# Patient Record
Sex: Female | Born: 1950
Health system: Southern US, Community
[De-identification: ages and names within clinical notes are randomized; demographics above are authoritative.]

## PROBLEM LIST (undated history)

## (undated) DIAGNOSIS — F329 Major depressive disorder, single episode, unspecified: Secondary | ICD-10-CM

## (undated) DIAGNOSIS — M8580 Other specified disorders of bone density and structure, unspecified site: Secondary | ICD-10-CM

## (undated) DIAGNOSIS — M542 Cervicalgia: Secondary | ICD-10-CM

## (undated) DIAGNOSIS — K219 Gastro-esophageal reflux disease without esophagitis: Secondary | ICD-10-CM

## (undated) DIAGNOSIS — G43009 Migraine without aura, not intractable, without status migrainosus: Secondary | ICD-10-CM

## (undated) DIAGNOSIS — I10 Essential (primary) hypertension: Secondary | ICD-10-CM

## (undated) HISTORY — PX: OVARIAN CYST REMOVAL: SHX89

## (undated) HISTORY — DX: Migraine without aura, not intractable, without status migrainosus: G43.009

## (undated) HISTORY — DX: Essential (primary) hypertension: I10

## (undated) HISTORY — DX: Gastro-esophageal reflux disease without esophagitis: K21.9

## (undated) HISTORY — PX: CHOLECYSTECTOMY: SHX55

## (undated) HISTORY — PX: REPAIR EXTENSOR TENDON HAND: SUR1171

## (undated) HISTORY — DX: Other specified disorders of bone density and structure, unspecified site: M85.80

## (undated) HISTORY — PX: BILATERAL OOPHORECTOMY: SHX1221

## (undated) HISTORY — DX: Major depressive disorder, single episode, unspecified: F32.9

## (undated) HISTORY — DX: Cervicalgia: M54.2

## (undated) HISTORY — PX: TONSILLECTOMY: SUR1361

## (undated) HISTORY — PX: ABDOMINAL HYSTERECTOMY: SHX81

---

## 2000-03-31 ENCOUNTER — Other Ambulatory Visit: Admission: RE | Admit: 2000-03-31 | Discharge: 2000-03-31 | Payer: Self-pay | Admitting: Gynecology

## 2001-04-02 ENCOUNTER — Other Ambulatory Visit: Admission: RE | Admit: 2001-04-02 | Discharge: 2001-04-02 | Payer: Self-pay | Admitting: Gynecology

## 2002-04-22 ENCOUNTER — Other Ambulatory Visit: Admission: RE | Admit: 2002-04-22 | Discharge: 2002-04-22 | Payer: Self-pay | Admitting: Gynecology

## 2003-04-30 ENCOUNTER — Other Ambulatory Visit: Admission: RE | Admit: 2003-04-30 | Discharge: 2003-04-30 | Payer: Self-pay | Admitting: Gynecology

## 2003-07-09 ENCOUNTER — Encounter: Admission: RE | Admit: 2003-07-09 | Discharge: 2003-07-09 | Payer: Self-pay | Admitting: Internal Medicine

## 2003-08-03 LAB — HM COLONOSCOPY: HM Colonoscopy: NORMAL

## 2003-08-18 ENCOUNTER — Encounter: Payer: Self-pay | Admitting: Internal Medicine

## 2004-05-05 ENCOUNTER — Ambulatory Visit: Payer: Self-pay | Admitting: Internal Medicine

## 2004-05-09 ENCOUNTER — Observation Stay (HOSPITAL_COMMUNITY): Admission: EM | Admit: 2004-05-09 | Discharge: 2004-05-10 | Payer: Self-pay | Admitting: Emergency Medicine

## 2004-05-09 ENCOUNTER — Encounter (INDEPENDENT_AMBULATORY_CARE_PROVIDER_SITE_OTHER): Payer: Self-pay | Admitting: *Deleted

## 2004-05-12 ENCOUNTER — Ambulatory Visit: Payer: Self-pay | Admitting: Internal Medicine

## 2004-06-15 ENCOUNTER — Other Ambulatory Visit: Admission: RE | Admit: 2004-06-15 | Discharge: 2004-06-15 | Payer: Self-pay | Admitting: Gynecology

## 2004-06-15 ENCOUNTER — Encounter: Payer: Self-pay | Admitting: Internal Medicine

## 2004-06-29 ENCOUNTER — Ambulatory Visit: Payer: Self-pay | Admitting: Internal Medicine

## 2004-07-13 ENCOUNTER — Ambulatory Visit: Payer: Self-pay | Admitting: Gastroenterology

## 2004-08-02 ENCOUNTER — Encounter: Payer: Self-pay | Admitting: Internal Medicine

## 2004-08-02 ENCOUNTER — Ambulatory Visit: Payer: Self-pay | Admitting: Gastroenterology

## 2004-08-26 ENCOUNTER — Ambulatory Visit: Payer: Self-pay | Admitting: Gastroenterology

## 2004-09-29 ENCOUNTER — Ambulatory Visit (HOSPITAL_COMMUNITY): Admission: RE | Admit: 2004-09-29 | Discharge: 2004-09-29 | Payer: Self-pay | Admitting: Gastroenterology

## 2004-11-22 ENCOUNTER — Ambulatory Visit: Payer: Self-pay | Admitting: Internal Medicine

## 2004-11-23 ENCOUNTER — Ambulatory Visit: Payer: Self-pay | Admitting: Cardiology

## 2005-02-22 ENCOUNTER — Ambulatory Visit: Payer: Self-pay | Admitting: Internal Medicine

## 2005-05-09 ENCOUNTER — Ambulatory Visit: Payer: Self-pay | Admitting: Internal Medicine

## 2005-05-13 ENCOUNTER — Ambulatory Visit: Payer: Self-pay | Admitting: Internal Medicine

## 2005-07-06 ENCOUNTER — Other Ambulatory Visit: Admission: RE | Admit: 2005-07-06 | Discharge: 2005-07-06 | Payer: Self-pay | Admitting: Gynecology

## 2005-09-02 ENCOUNTER — Ambulatory Visit: Payer: Self-pay | Admitting: Internal Medicine

## 2005-09-05 ENCOUNTER — Encounter: Admission: RE | Admit: 2005-09-05 | Discharge: 2005-09-05 | Payer: Self-pay | Admitting: Orthopaedic Surgery

## 2005-09-09 ENCOUNTER — Encounter: Admission: RE | Admit: 2005-09-09 | Discharge: 2005-09-09 | Payer: Self-pay | Admitting: Internal Medicine

## 2005-09-22 ENCOUNTER — Ambulatory Visit: Payer: Self-pay | Admitting: Internal Medicine

## 2005-10-14 ENCOUNTER — Ambulatory Visit: Payer: Self-pay | Admitting: Internal Medicine

## 2005-11-25 ENCOUNTER — Ambulatory Visit: Payer: Self-pay | Admitting: Gastroenterology

## 2005-12-13 ENCOUNTER — Ambulatory Visit: Payer: Self-pay | Admitting: Gastroenterology

## 2006-01-06 ENCOUNTER — Ambulatory Visit: Payer: Self-pay | Admitting: Internal Medicine

## 2006-07-06 ENCOUNTER — Ambulatory Visit: Payer: Self-pay | Admitting: Internal Medicine

## 2006-07-06 LAB — CONVERTED CEMR LAB
ALT: 16 units/L (ref 0–40)
AST: 18 units/L (ref 0–37)
Albumin: 4.3 g/dL (ref 3.5–5.2)
Alkaline Phosphatase: 57 units/L (ref 39–117)
BUN: 12 mg/dL (ref 6–23)
Basophils Absolute: 0 10*3/uL (ref 0.0–0.1)
Basophils Relative: 0.8 % (ref 0.0–1.0)
Bilirubin, Direct: 0.1 mg/dL (ref 0.0–0.3)
CO2: 32 meq/L (ref 19–32)
Calcium: 9.7 mg/dL (ref 8.4–10.5)
Chloride: 106 meq/L (ref 96–112)
Cholesterol: 219 mg/dL (ref 0–200)
Creatinine, Ser: 0.9 mg/dL (ref 0.4–1.2)
Direct LDL: 134.8 mg/dL
Eosinophils Absolute: 0.1 10*3/uL (ref 0.0–0.6)
Eosinophils Relative: 2.8 % (ref 0.0–5.0)
GFR calc Af Amer: 84 mL/min
GFR calc non Af Amer: 70 mL/min
Glucose, Bld: 88 mg/dL (ref 70–99)
HCT: 44.9 % (ref 36.0–46.0)
HDL: 61.6 mg/dL (ref >39.0)
Hemoglobin: 15.2 g/dL — ABNORMAL HIGH (ref 12.0–15.0)
Lymphocytes Relative: 40.7 % (ref 12.0–46.0)
MCHC: 34 g/dL (ref 30.0–36.0)
MCV: 95.2 fL (ref 78.0–100.0)
Monocytes Absolute: 0.4 10*3/uL (ref 0.2–0.7)
Monocytes Relative: 10.6 % (ref 3.0–11.0)
Neutro Abs: 2 10*3/uL (ref 1.4–7.7)
Neutrophils Relative %: 45.1 % (ref 43.0–77.0)
Platelets: 313 10*3/uL (ref 150–400)
Potassium: 4.2 meq/L (ref 3.5–5.1)
RBC: 4.71 M/uL (ref 3.87–5.11)
RDW: 12.1 % (ref 11.5–14.6)
Sodium: 142 meq/L (ref 135–145)
TSH: 2.04 microintl units/mL (ref 0.35–5.50)
Total Bilirubin: 0.9 mg/dL (ref 0.3–1.2)
Total CHOL/HDL Ratio: 3.6
Total Protein: 7.1 g/dL (ref 6.0–8.3)
Triglycerides: 100 mg/dL (ref 0–149)
VLDL: 20 mg/dL (ref 0–40)
WBC: 4.2 10*3/uL — ABNORMAL LOW (ref 4.5–10.5)

## 2006-07-12 ENCOUNTER — Ambulatory Visit: Payer: Self-pay | Admitting: Internal Medicine

## 2006-08-07 ENCOUNTER — Other Ambulatory Visit: Admission: RE | Admit: 2006-08-07 | Discharge: 2006-08-07 | Payer: Self-pay | Admitting: Gynecology

## 2006-09-19 ENCOUNTER — Ambulatory Visit: Payer: Self-pay | Admitting: Internal Medicine

## 2006-09-20 DIAGNOSIS — G43009 Migraine without aura, not intractable, without status migrainosus: Secondary | ICD-10-CM

## 2006-09-20 HISTORY — DX: Migraine without aura, not intractable, without status migrainosus: G43.009

## 2006-11-03 ENCOUNTER — Ambulatory Visit: Payer: Self-pay | Admitting: Internal Medicine

## 2006-11-07 ENCOUNTER — Ambulatory Visit: Payer: Self-pay | Admitting: Internal Medicine

## 2006-11-09 ENCOUNTER — Telehealth (INDEPENDENT_AMBULATORY_CARE_PROVIDER_SITE_OTHER): Payer: Self-pay | Admitting: *Deleted

## 2006-12-11 ENCOUNTER — Encounter: Payer: Self-pay | Admitting: Internal Medicine

## 2006-12-22 ENCOUNTER — Telehealth: Payer: Self-pay | Admitting: Internal Medicine

## 2006-12-25 ENCOUNTER — Telehealth: Payer: Self-pay | Admitting: Internal Medicine

## 2006-12-25 ENCOUNTER — Encounter: Payer: Self-pay | Admitting: Internal Medicine

## 2007-01-08 ENCOUNTER — Encounter: Payer: Self-pay | Admitting: Internal Medicine

## 2007-01-18 ENCOUNTER — Telehealth (INDEPENDENT_AMBULATORY_CARE_PROVIDER_SITE_OTHER): Payer: Self-pay | Admitting: *Deleted

## 2007-01-25 ENCOUNTER — Ambulatory Visit (HOSPITAL_BASED_OUTPATIENT_CLINIC_OR_DEPARTMENT_OTHER): Admission: RE | Admit: 2007-01-25 | Discharge: 2007-01-25 | Payer: Self-pay | Admitting: Orthopedic Surgery

## 2007-01-25 ENCOUNTER — Ambulatory Visit: Payer: Self-pay | Admitting: Internal Medicine

## 2007-02-02 ENCOUNTER — Encounter: Payer: Self-pay | Admitting: Internal Medicine

## 2007-05-14 ENCOUNTER — Encounter: Payer: Self-pay | Admitting: Internal Medicine

## 2007-06-13 ENCOUNTER — Telehealth: Payer: Self-pay | Admitting: Internal Medicine

## 2007-08-02 ENCOUNTER — Ambulatory Visit: Payer: Self-pay | Admitting: Internal Medicine

## 2007-08-02 LAB — CONVERTED CEMR LAB
ALT: 18 units/L (ref 0–35)
BUN: 12 mg/dL (ref 6–23)
Basophils Relative: 0.4 % (ref 0.0–1.0)
Bilirubin, Direct: 0.1 mg/dL (ref 0.0–0.3)
Blood in Urine, dipstick: NEGATIVE
CO2: 32 meq/L (ref 19–32)
Chloride: 106 meq/L (ref 96–112)
Creatinine, Ser: 1 mg/dL (ref 0.4–1.2)
Direct LDL: 147 mg/dL
GFR calc Af Amer: 74 mL/min
Glucose, Urine, Semiquant: NEGATIVE
HCT: 42.2 % (ref 36.0–46.0)
HDL: 56.1 mg/dL (ref >39.0)
Hemoglobin: 14.3 g/dL (ref 12.0–15.0)
Monocytes Absolute: 0.3 10*3/uL (ref 0.1–1.0)
Monocytes Relative: 10.2 % (ref 3.0–12.0)
Potassium: 4 meq/L (ref 3.5–5.1)
RBC: 4.46 M/uL (ref 3.87–5.11)
Sodium: 143 meq/L (ref 135–145)
Specific Gravity, Urine: 1.02
Total Bilirubin: 0.9 mg/dL (ref 0.3–1.2)
Total Protein: 6.9 g/dL (ref 6.0–8.3)
Triglycerides: 78 mg/dL (ref 0–149)
VLDL: 16 mg/dL (ref 0–40)
WBC Urine, dipstick: NEGATIVE
WBC: 3.3 10*3/uL — ABNORMAL LOW (ref 4.5–10.5)
pH: 6.5

## 2007-08-13 ENCOUNTER — Ambulatory Visit: Payer: Self-pay | Admitting: Internal Medicine

## 2007-08-15 ENCOUNTER — Encounter: Payer: Self-pay | Admitting: Internal Medicine

## 2007-08-17 ENCOUNTER — Encounter: Payer: Self-pay | Admitting: Internal Medicine

## 2007-09-04 ENCOUNTER — Encounter: Payer: Self-pay | Admitting: Internal Medicine

## 2007-10-24 ENCOUNTER — Telehealth: Payer: Self-pay | Admitting: Internal Medicine

## 2008-01-04 ENCOUNTER — Ambulatory Visit: Payer: Self-pay | Admitting: Internal Medicine

## 2008-04-11 ENCOUNTER — Telehealth: Payer: Self-pay | Admitting: Internal Medicine

## 2008-04-29 ENCOUNTER — Telehealth: Payer: Self-pay | Admitting: Internal Medicine

## 2008-07-08 ENCOUNTER — Encounter (INDEPENDENT_AMBULATORY_CARE_PROVIDER_SITE_OTHER): Payer: Self-pay | Admitting: *Deleted

## 2008-07-21 ENCOUNTER — Ambulatory Visit: Payer: Self-pay | Admitting: Internal Medicine

## 2008-07-21 DIAGNOSIS — M542 Cervicalgia: Secondary | ICD-10-CM | POA: Insufficient documentation

## 2008-11-03 ENCOUNTER — Encounter: Payer: Self-pay | Admitting: Internal Medicine

## 2008-11-03 LAB — CONVERTED CEMR LAB
Cholesterol: 232 mg/dL
HDL: 76 mg/dL
LDL Cholesterol: 139 mg/dL

## 2008-12-09 ENCOUNTER — Ambulatory Visit: Payer: Self-pay | Admitting: Internal Medicine

## 2009-05-18 ENCOUNTER — Telehealth: Payer: Self-pay | Admitting: Internal Medicine

## 2009-06-11 ENCOUNTER — Ambulatory Visit: Payer: Self-pay | Admitting: Internal Medicine

## 2009-06-11 DIAGNOSIS — K219 Gastro-esophageal reflux disease without esophagitis: Secondary | ICD-10-CM | POA: Insufficient documentation

## 2009-06-11 DIAGNOSIS — F3289 Other specified depressive episodes: Secondary | ICD-10-CM

## 2009-06-11 DIAGNOSIS — F329 Major depressive disorder, single episode, unspecified: Secondary | ICD-10-CM

## 2009-06-11 DIAGNOSIS — F339 Major depressive disorder, recurrent, unspecified: Secondary | ICD-10-CM | POA: Insufficient documentation

## 2009-06-11 HISTORY — DX: Gastro-esophageal reflux disease without esophagitis: K21.9

## 2009-06-11 HISTORY — DX: Major depressive disorder, single episode, unspecified: F32.9

## 2009-06-11 HISTORY — DX: Other specified depressive episodes: F32.89

## 2009-06-12 ENCOUNTER — Ambulatory Visit: Payer: Self-pay | Admitting: Internal Medicine

## 2009-06-15 LAB — CONVERTED CEMR LAB
ALT: 23 units/L (ref 0–35)
AST: 22 units/L (ref 0–37)
Alkaline Phosphatase: 62 units/L (ref 39–117)
Amylase: 68 units/L (ref 27–131)
Bilirubin, Direct: 0.1 mg/dL (ref 0.0–0.3)
Calcium: 9.4 mg/dL (ref 8.4–10.5)
Creatinine, Ser: 0.8 mg/dL (ref 0.4–1.2)
GFR calc non Af Amer: 78.69 mL/min (ref >60)
HCT: 43.2 % (ref 36.0–46.0)
Lymphocytes Relative: 39 % (ref 12.0–46.0)
MCHC: 32.9 g/dL (ref 30.0–36.0)
MCV: 96.4 fL (ref 78.0–100.0)
Neutrophils Relative %: 43.9 % (ref 43.0–77.0)
RBC: 4.48 M/uL (ref 3.87–5.11)
RDW: 11.7 % (ref 11.5–14.6)
Sed Rate: 6 mm/hr (ref 0–22)
Sodium: 141 meq/L (ref 135–145)
Total Protein: 6.9 g/dL (ref 6.0–8.3)

## 2009-08-28 ENCOUNTER — Ambulatory Visit: Payer: Self-pay | Admitting: Internal Medicine

## 2009-10-27 ENCOUNTER — Encounter: Admission: RE | Admit: 2009-10-27 | Discharge: 2009-10-27 | Payer: Self-pay | Admitting: Gynecology

## 2009-11-17 ENCOUNTER — Encounter: Payer: Self-pay | Admitting: Internal Medicine

## 2010-05-04 NOTE — Progress Notes (Signed)
Summary: pantoprazole  Phone Note Call from Patient Call back at Home Phone [phone]   Caller: Patient Call For: Birdie Sons MD Summary of Call: pt needs refill on pantoprazole 40 mg [facility] [address] [phone] Initial call taken by: Heron Sabins,  May 18, 2009 4:57 PM  Follow-up for Phone Call        see Rx.  Husband notified. Follow-up by: Gladis Riffle, RN,  May 19, 2009 11:47 AM    Prescriptions: PANTOPRAZOLE SODIUM 40 MG TBEC (PANTOPRAZOLE SODIUM) Take 1 tablet by mouth once a day  #90 x 3   Entered by:   Gladis Riffle, RN   Authorized by:   Birdie Sons MD   Signed by:   Gladis Riffle, RN on 05/19/2009   Method used:   [facility]        [facility] [address] [phone]* (retail)       [address]       [place], [place]  [address]       Ph: [phone] or [phone]       Fax: [phone]   RxID:   [id number]

## 2010-05-04 NOTE — Assessment & Plan Note (Signed)
Summary: f/u neck.dm   Vital Signs:  Patient profile:   60 year old female Weight:      142 pounds BMI:     23.35 Temp:     98.9 degrees F oral Pulse rate:   80 / minute Pulse rhythm:   regular Resp:     12 per minute BP sitting:   132 / 88  (left arm) Cuff size:   regular  Vitals Entered By: Gladis Riffle, RN (Aug 28, 2009 11:54 AM) CC: FU neck pain--improved since went off pantoprazole Is Patient Diabetic? No Comments put self on OTC zantac as needed  i  CC:  FU neck pain--improved since went off pantoprazole.  History of Present Illness: neck pain - better ROM improved  GERD---no sxs off of protonix (self dc)  headache---migraines are well controlled  All other systems reviewed and were negative   Preventive Screening-Counseling & Management  Alcohol-Tobacco     Smoking Status: quit     Year Quit: 2005  Current Medications (verified): 1)  Aspir-81 81 Mg Tbec (Aspirin) .... Take 1 Tablet By Mouth Once A Day 2)  Imitrex 100 Mg Tabs (Sumatriptan Succinate) .... Take 1 Tablet By Mouth Once A Day As Needed 3)  Vivelle-Dot 0.075 Mg/24hr Pttw (Estradiol) .... Apply 1/4 As Directed 4)  Wellbutrin Sr 150 Mg Tb12 (Bupropion Hcl) .... Take 1 Tablet By Mouth Twice A Day 5)  Multivitamins   Tabs (Multiple Vitamin) .... Once Daily 6)  Caltrate 600+d 600-400 Mg-Unit  Tabs (Calcium Carbonate-Vitamin D) .... Two Times A Day 7)  Vitamin D 1000 Unit  Tabs (Cholecalciferol) .... Once Daily 8)  Vitamin E Natural 400 Unit  Caps (Vitamin E) .... Once Daily 9)  Chlorzoxazone 500 Mg Tabs (Chlorzoxazone) .... Take 1 Tablet By Mouth Two Times A Day As Needed Neck or Back Pain 10)  Niacin 250 Mg Tabs (Niacin) .... Once Daily  Allergies (verified): No Known Drug Allergies  Past History:  Past Medical History: Last updated: 06/11/2009 mood disorder migraine headaches Depression GERD  Past Surgical History: Last updated: 09/20/2006 Cholecystectomy  2005 Hysterectomy 1991 ovarian  cyst 1996  Family History: Last updated: 2007/08/24 mother deceased age 83--osteoporosis/ dementia father deceased stroke 31s 1 brother MI (late 38s) 1 sister deceased breast CA  Social History: Last updated: 11/03/2006 Occupation: Married Regular exercise-no Former Smoker  Risk Factors: Exercise: no (11/03/2006)  Risk Factors: Smoking Status: quit (08/28/2009)  Physical Exam  General:  alert and well-developed.   Head:  normocephalic and atraumatic.   Eyes:  pupils equal and pupils round.   Neck:  No deformities, masses, or tenderness noted. Chest Wall:  No deformities, masses, or tenderness noted. Lungs:  normal respiratory effort and no intercostal retractions.   Heart:  normal rate and regular rhythm.   Abdomen:  Bowel sounds positive,abdomen soft and non-tender without masses, organomegaly or hernias noted. Msk:  No deformity or scoliosis noted of thoracic or lumbar spine.   Neurologic:  cranial nerves II-XII intact and gait normal.     Impression & Recommendations:  Problem # 1:  NECK PAIN (ICD-723.1) better encouraged daily neck exercises The following medications were removed from the medication list:    Chlorzoxazone 500 Mg Tabs (Chlorzoxazone) .Marland Kitchen... Take 1 tablet by mouth two times a day as needed neck or back pain Her updated medication list for this problem includes:    Aspir-81 81 Mg Tbec (Aspirin) .Marland Kitchen... Take 1 tablet by mouth once a day  Problem # 2:  GERD (ICD-530.81) she has stopped ppi and is doing well Her updated medication list for this problem includes:    Zantac 150 Maximum Strength 150 Mg Tabs (Ranitidine hcl) .Marland Kitchen... As needed  Problem # 3:  COMMON MIGRAINE (ICD-346.10) doing well continue current medications  Her updated medication list for this problem includes:    Aspir-81 81 Mg Tbec (Aspirin) .Marland Kitchen... Take 1 tablet by mouth once a day    Imitrex 100 Mg Tabs (Sumatriptan succinate) .Marland Kitchen... Take 1 tablet by mouth once a day as  needed  Complete Medication List: 1)  Aspir-81 81 Mg Tbec (Aspirin) .... Take 1 tablet by mouth once a day 2)  Imitrex 100 Mg Tabs (Sumatriptan succinate) .... Take 1 tablet by mouth once a day as needed 3)  Vivelle-dot 0.075 Mg/24hr Pttw (Estradiol) .... Apply 1/4 as directed 4)  Wellbutrin Sr 150 Mg Tb12 (Bupropion hcl) .... Take 1 tablet by mouth twice a day 5)  Multivitamins Tabs (Multiple vitamin) .... Once daily 6)  Caltrate 600+d 600-400 Mg-unit Tabs (Calcium carbonate-vitamin d) .... Two times a day 7)  Vitamin D 1000 Unit Tabs (Cholecalciferol) .... Once daily 8)  Vitamin E Natural 400 Unit Caps (Vitamin e) .... Once daily 9)  Niacin 250 Mg Tabs (Niacin) .... Once daily 10)  Zantac 150 Maximum Strength 150 Mg Tabs (Ranitidine hcl) .... As needed

## 2010-05-04 NOTE — Assessment & Plan Note (Signed)
Summary: 6 month rov/njr rsc bmp/njr   Vital Signs:  Patient profile:   60 year old female Weight:      142 pounds Temp:     98.2 degrees F oral Pulse rate:   72 / minute Resp:     12 per minute BP sitting:   134 / 80  Vitals Entered By: Lynann Beaver CMA (June 11, 2009 4:16 PM) CC: recheck neck Is Patient Diabetic? No Pain Assessment Patient in pain? yes     Location: neck   CC:  recheck neck.  History of Present Illness: neck pain---chronic unchanged---wondering if triggering more migraines no radiation of pain---ongoing now for 1 year...not really progressive  mood disorder---much improved  All other systems reviewed and were negative   Current Problems (verified): 1)  Depression  (ICD-311) 2)  Neck Pain  (ICD-723.1) 3)  Preventive Health Care  (ICD-V70.0) 4)  Common Migraine  (ICD-346.10)  Current Medications (verified): 1)  Aspir-81 81 Mg Tbec (Aspirin) .... Take 1 Tablet By Mouth Once A Day 2)  Imitrex 100 Mg Tabs (Sumatriptan Succinate) .... Take 1 Tablet By Mouth Once A Day As Needed 3)  Pantoprazole Sodium 40 Mg Tbec (Pantoprazole Sodium) .... Take 1 Tablet By Mouth Once A Day 4)  Vivelle-Dot 0.075 Mg/24hr Pttw (Estradiol) .... Apply 1/2 As Directed 5)  Wellbutrin Sr 150 Mg Tb12 (Bupropion Hcl) .... Take 1 Tablet By Mouth Twice A Day 6)  Multivitamins   Tabs (Multiple Vitamin) .... Once Daily 7)  Caltrate 600+d 600-400 Mg-Unit  Tabs (Calcium Carbonate-Vitamin D) .... Two Times A Day 8)  Vitamin D 1000 Unit  Tabs (Cholecalciferol) .... Once Daily 9)  Vitamin E Natural 400 Unit  Caps (Vitamin E) .... Once Daily 10)  Chlorzoxazone 500 Mg Tabs (Chlorzoxazone) .... Take 1 Tablet By Mouth Two Times A Day As Needed Neck or Back Pain 11)  Meloxicam 7.5 Mg  Tabs (Meloxicam) .... One By Mouth Daily With Food As Needed For Neck or Back Pain  Allergies (verified): No Known Drug Allergies  Past History:  Past Medical History: mood disorder migraine  headaches Depression GERD  Physical Exam  General:  Well-developed,well-nourished,in no acute distress; alert,appropriate and cooperative throughout examination Head:  normocephalic and atraumatic.   Eyes:  pupils equal and pupils round.   Neck:  decreased extension decreased rotation left and right Chest Wall:  No deformities, masses, or tenderness noted. Lungs:  Normal respiratory effort, chest expands symmetrically. Lungs are clear to auscultation, no crackles or wheezes. Heart:  normal rate and regular rhythm.   Abdomen:  Bowel sounds positive,abdomen soft and non-tender without masses, organomegaly or hernias noted. Msk:  No deformity or scoliosis noted of thoracic or lumbar spine.   Neurologic:  cranial nerves II-XII intact and gait normal.     Impression & Recommendations:  Problem # 1:  DEPRESSION (ICD-311) clinically doing ok Her updated medication list for this problem includes:    Wellbutrin Sr 150 Mg Tb12 (Bupropion hcl) .Marland Kitchen... Take 1 tablet by mouth twice a day  Problem # 2:  NECK PAIN (ICD-723.1) chronic check xray may need PT The following medications were removed from the medication list:    Meloxicam 7.5 Mg Tabs (Meloxicam) ..... One by mouth daily with food as needed for neck or back pain Her updated medication list for this problem includes:    Aspir-81 81 Mg Tbec (Aspirin) .Marland Kitchen... Take 1 tablet by mouth once a day    Chlorzoxazone 500 Mg Tabs (Chlorzoxazone) .Marland Kitchen... Take  1 tablet by mouth two times a day as needed neck or back pain  Orders: T-Cervical Spine Comp 4 Views (72050TC)  Problem # 3:  GERD (ICD-530.81) controlled continue current medications  Her updated medication list for this problem includes:    Pantoprazole Sodium 40 Mg Tbec (Pantoprazole sodium) .Marland Kitchen... Take 1 tablet by mouth once a day  Labs Reviewed: Hgb: 14.3 (08/02/2007)   Hct: 42.2 (08/02/2007)  Problem # 4:  ABDOMINAL PAIN (ICD-789.00) discussed needs evaluation check  labs Orders: Venipuncture (30865) TLB-BMP (Basic Metabolic Panel-BMET) (80048-METABOL) TLB-CBC Platelet - w/Differential (85025-CBCD) TLB-Hepatic/Liver Function Pnl (80076-HEPATIC) TLB-Amylase (82150-AMYL)  Complete Medication List: 1)  Aspir-81 81 Mg Tbec (Aspirin) .... Take 1 tablet by mouth once a day 2)  Imitrex 100 Mg Tabs (Sumatriptan succinate) .... Take 1 tablet by mouth once a day as needed 3)  Pantoprazole Sodium 40 Mg Tbec (Pantoprazole sodium) .... Take 1 tablet by mouth once a day 4)  Vivelle-dot 0.075 Mg/24hr Pttw (Estradiol) .... Apply 1/2 as directed 5)  Wellbutrin Sr 150 Mg Tb12 (Bupropion hcl) .... Take 1 tablet by mouth twice a day 6)  Multivitamins Tabs (Multiple vitamin) .... Once daily 7)  Caltrate 600+d 600-400 Mg-unit Tabs (Calcium carbonate-vitamin d) .... Two times a day 8)  Vitamin D 1000 Unit Tabs (Cholecalciferol) .... Once daily 9)  Vitamin E Natural 400 Unit Caps (Vitamin e) .... Once daily 10)  Chlorzoxazone 500 Mg Tabs (Chlorzoxazone) .... Take 1 tablet by mouth two times a day as needed neck or back pain  Other Orders: TLB-Sedimentation Rate (ESR) (85652-ESR)  Patient Instructions: 1)  call me 10 days if abdominal pain persist 2)  we will call you with neck xray results

## 2010-05-04 NOTE — Letter (Signed)
Summary: Annual Exam-Dr. Beather Arbour  Annual Exam-Dr. Beather Arbour   Imported By: Maryln Gottron 12/02/2009 15:25:25  _____________________________________________________________________  External Attachment:    Type:   Image     Comment:   External Document

## 2010-05-14 ENCOUNTER — Encounter: Payer: Self-pay | Admitting: Internal Medicine

## 2010-05-17 ENCOUNTER — Encounter: Payer: Self-pay | Admitting: Internal Medicine

## 2010-05-17 ENCOUNTER — Ambulatory Visit (INDEPENDENT_AMBULATORY_CARE_PROVIDER_SITE_OTHER): Payer: BC Managed Care – PPO | Admitting: Internal Medicine

## 2010-05-17 VITALS — BP 122/74 | HR 76 | Temp 98.9°F | Ht 65.75 in | Wt 145.5 lb

## 2010-05-17 DIAGNOSIS — K219 Gastro-esophageal reflux disease without esophagitis: Secondary | ICD-10-CM

## 2010-05-17 DIAGNOSIS — R51 Headache: Secondary | ICD-10-CM

## 2010-05-17 DIAGNOSIS — M542 Cervicalgia: Secondary | ICD-10-CM

## 2010-05-17 NOTE — Progress Notes (Signed)
ZOX:WRUEA arm discomfort. Feels heavy at times-like a squeezing sensation and then resolves with a tingling sensation. Resolves spontaneously. Sxs wax and wane. Can occur daily. She can't determine any exacerbating or alleviating factors. Not exertional. Seems to get better if she moves her shoulder around. Does not interfere with sleep. Duration: several weeks.    ROS: patient denies chest pain, shortness of breath, orthopnea. Denies lower extremity edema, abdominal pain, change in appetite, change in bowel movements. Patient denies rashes, musculoskeletal complaints. No other specific complaints in a complete review of systems.    EXAM:  Well-developed well-nourished female in no acute distress. HEENT exam atraumatic, normocephalic, extraocular muscles are intact. Neck is supple. No jugular venous distention no thyromegaly. Chest clear to auscultation without increased work of breathing. Cardiac exam S1 and S2 are regular. Abdominal exam active bowel sounds, soft, nontender. Extremities no edema. Neurologic exam she is alert without any motor sensory deficits. Gait is normal.

## 2010-05-17 NOTE — Assessment & Plan Note (Signed)
Patient comes in with right arm discomfort. sHe she has some changes in her physical exam which are concerning for radiculopathy. I discussed the workup. She would prfer to trya nonsterodal anti-inflammatory rug, neck exercises and call back in 3-4 weeks if symptoms persist. I think the next step would be a cervical spine MRI. This was discussed with the patient. She has some financial concerns related to her insurance

## 2010-05-18 MED ORDER — PANTOPRAZOLE SODIUM 40 MG PO TBEC: 40.0000 mg | DELAYED_RELEASE_TABLET | Freq: Every day | ORAL | Status: DC

## 2010-05-18 MED ORDER — SUMATRIPTAN SUCCINATE 100 MG PO TABS: 100.0000 mg | ORAL_TABLET | Freq: Every day | ORAL | Status: DC | PRN

## 2010-05-18 NOTE — Progress Notes (Signed)
Addended byAlfred Levins on: 05/18/2010 05:17 PM   Modules accepted: Orders

## 2010-05-19 ENCOUNTER — Telehealth: Payer: Self-pay | Admitting: Internal Medicine

## 2010-05-19 MED ORDER — MELOXICAM 7.5 MG PO TABS: 7.5000 mg | ORAL_TABLET | Freq: Every day | ORAL | Status: DC

## 2010-05-19 NOTE — Telephone Encounter (Signed)
rx called in, pt aware 

## 2010-05-19 NOTE — Telephone Encounter (Signed)
meloxicam 7.5 mg po qd for 30 days. No refill

## 2010-05-19 NOTE — Telephone Encounter (Signed)
Pt called and said that her script for anti inflamatory has still not been rcvd by [facility]. Pt said that the script renewals were rcvd, but not the new med. Pls call in today. Pt would like to be called when this is done.

## 2010-05-19 NOTE — Telephone Encounter (Signed)
Pt said she was here for her appt she was suppose to get a new rx

## 2010-07-14 ENCOUNTER — Encounter: Payer: Self-pay | Admitting: Internal Medicine

## 2010-07-14 ENCOUNTER — Ambulatory Visit (INDEPENDENT_AMBULATORY_CARE_PROVIDER_SITE_OTHER): Payer: BC Managed Care – PPO | Admitting: Internal Medicine

## 2010-07-14 VITALS — BP 124/82 | HR 64 | Temp 98.5°F | Ht 66.0 in | Wt 144.0 lb

## 2010-07-14 DIAGNOSIS — M542 Cervicalgia: Secondary | ICD-10-CM

## 2010-07-14 MED ORDER — MELOXICAM 7.5 MG PO TABS: 7.5000 mg | ORAL_TABLET | Freq: Every day | ORAL | Status: DC

## 2010-07-16 NOTE — Assessment & Plan Note (Addendum)
I have reviewed previous imaging studies. I reviewed the actual films with the patient. She has marked degenerative changes and marked degenerative disc changes. I suspect her symptoms are coming from a radicular problem. This was discussed with her in detail. She will like to avoid further imaging or surgery at this time. I think his best to send her to physical therapy. Meloxicam 7/2 mg daily. Side effects discussed.

## 2010-07-16 NOTE — Progress Notes (Signed)
  Subjective:    Patient ID: Lindsay [name], female    DOB: 1950-04-06, 59 y.o.   MRN: [id number]  HPI  Patient comes in complaining of right arm tingling and heaviness associated with neck discomfort. This has been ongoing for several years. Symptoms tend to wax and wane. Her symptoms occur several days weekly. She's not able to elicit the discomfort by specific movements but she does note that when she flexes or extends her neck occasionally that will reproduce the pain. She denies any upper history weakness. She denies any other neurologic complaints.  Past Medical History  Diagnosis Date  . COMMON MIGRAINE 09/20/2006  . DEPRESSION 06/11/2009  . GERD 06/11/2009  . Mood disorder    Past Surgical History  Procedure Date  . Cholecystectomy   . Abdominal hysterectomy     reports that she has quit smoking. Her smoking use included Cigarettes. She does not have any smokeless tobacco history on file. She reports that she does not use illicit drugs. Her alcohol history not on file. family history includes Breast cancer in her sister; Dementia in her mother; Heart attack in her brother; and Osteoporosis in her mother. Not on File   Review of Systems     patient denies chest pain, shortness of breath, orthopnea. Denies lower extremity edema, abdominal pain, change in appetite, change in bowel movements. Patient denies rashes, musculoskeletal complaints. No other specific complaints in a complete review of systems.   Objective:   Physical Exam Well-developed female in no acute distress. HEENT exam atraumatic, normocephalic, obstructive muscles are intact. Neck is supple with full range of motion. I'm unable to elicit discomfort by palpation of her neck. She has full range of motion of both shoulders. Deep tendon reflexes in the upper extremity are normal. Chest clear to auscultation.       Assessment & Plan:

## 2010-08-17 NOTE — Op Note (Signed)
NAME:  Lindsay [name], Lindsay [name]                  ACCOUNT NO.:  [id number]   MEDICAL RECORD NO.:  [id number]          PATIENT TYPE:  AMB   LOCATION:  [facility]SC                          FACILITY:  [facility]   PHYSICIAN:  Katy Fitch. Sypher, M.D. 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   POSTOPERATIVE DIAGNOSES:  Chronic instability of right long finger  extensor tendon due to rupture of radial sagittal fibers at metacarpal  phalangeal joint status post failed attempt at closed treatment  utilizing an extensor tendon radialization splint.  Identification of  significant contracture of ulnar sagittal fibers.   OPERATION:  Tenolysis of right long finger extensor tendon followed by  reconstruction of radial sagittal fibers and extensor hood utilizing a  distally based extensor digitorum longus tendon graft augmented by  reefing of the remnants of the radial sagittal fibers and release of the  ulnar sagittal fibers.   SURGEON:  Dr. Josephine Igo.   ASSISTANT:  Annye Rusk PA-C.   ANESTHESIA:  General by LMA. Supervising anesthesiologist is Dr.  Gelene Mink.   INDICATIONS:  Lindsay [name] is a 60 year old woman referred through the  courtesy of Dr. Birdie Sons following an injury to her right long  finger.  She was thumping with her long finger on November 16, 2006 at  which time she ruptured the radial sagittal fibers.  She had obvious  subluxation of her extensor tendon into the ulnar gutter between the  long and ring finger metacarpal head and had marked swelling and an  extensor lag at the MP joint.   We offered her two treatment alternatives.   We have had limited  success with closed treatment with splint  radializing the MP joint and allowing contracture of the scar realigning  the extensor.  We also offered immediate reconstruction with a tendon  graft.   She was eager to avoid surgery therefore she attempted a 6-week course  of splinting.  Unfortunately, we did not have any success whatsoever  with the splint and therefore, she is brought to the operating room at  this time for reconstruction of her extensor mechanism anticipating the  use of a distally based extensor digitorum longus tendon graft.   After informed consent, she is brought to the operating room at this  time.   DESCRIPTION OF PROCEDURE:  Lindsay [name] is brought to the operating  room and placed in supine position on the operating table.   Following the induction of general anesthesia by LMA technique, the  right arm was prepped with Betadine soap solution and sterilely draped.  A pneumatic tourniquet was applied to the proximal right brachium.   Following exsanguination of the right arm with Esmarch bandage, the  arterial tourniquet was inflated to 220 mmHg.   The procedure commenced  with a curvilinear incision exposing the  extensor mechanism.  We immediately noted obvious rupture of the radial  sagittal fibers with development of pseudotendon.  The extensor tendon  was resting in the gutter between the long metacarpal head and ring  metacarpal head.   The sagittal fibers were tenolysed on the radial and ulnar aspect of the  finger, taking care to preserve the capsule of the MP joint.  The ulnar  sagittal fibers were piecrusted to allow expansion.  The extensor tendon  was centralized followed by dissection of the gutter between the index  and long finger metacarpal heads.  A 5-cm long distally based strip of  the extensor digitorum longus 3 mm in width was harvested, subsequently  brought through the radial sagittal fibers underneath the  intermetacarpal ligament and  back up through the radial sagittal fibers.  This was tensioned to realign the extensor mechanism in the midline.  The radial sagittal fibers were then used to reinforce this  reconstruction with multiple interrupted sutures of 3-0 Ethibond.   The extensor was centralized. The remnants of the ulnar sagittal fibers  that had been piecrusted were then adjusted with interrupted sutures to  maintain proper tension of the ulnar sagittal fibers.   Care was taken to check for intrinsic tightness.  None was identified of  either the dorsal or palmar interossei.   The wound was then lavaged with sterile saline followed by repair of the  skin with intradermal 3-0 Prolene.  A compressive dressing was applied  with Xeroflo sterile gauze, sterile Webril and a volar plaster splint  maintaining the wrist in a neutral position.   Ms. Paternostro will elevate her hand for 1 week. We will see her back in  follow-up in 1 week at which time we will replace her in a radialization  splint. There were no apparent complications.     Note that she was provided 1 gram of Ancef as an IV prophylactic  antibiotic.   She will be discharged with prescriptions for Percocet 5 mg 1 p.o. q. 4-  6 hours p.r.n. pain and also Keflex 500 mg 1 p.o. q.8 h x4 days as a  prophylactic antibiotic and Motrin 600 mg 1 p.o. q.6 h p.r.n. pain 30  tablets with 1 refill.      Katy Fitch Sypher, M.D.  Electronically Signed     RVS/MEDQ  D:  01/25/2007  T:  01/26/2007  Job:  [id number]   cc:   Valetta Mole. Swords, MD

## 2010-08-17 NOTE — Assessment & Plan Note (Signed)
[facility]                                 ON-CALL NOTE   NAME:Lindsay [name], Lindsay [name]                           MRN:          [id number]  DATE:11/03/2006                            DOB:          May 06, 1950    PHONE NUMBER:  [phone]   This is a patient of Dr. Cato Mulligan.  I am Dr. Milinda Antis on call.   CHIEF COMPLAINT:  Prescription did not get called in.  The patient said  she saw Dr. Cato Mulligan today for hip problems.  She is on Lodine, which was  not working, and he was supposed to call her in a prednisone  prescription to the [address] on [address].  She called several times.  It never got called in, and she is fairly certain it went to the wrong  one.  The prescription was for a taper.  She read the taper off the  piece of paper for me, and I went ahead and called it in to [facility] on  [address] at [phone] with instructions for prednisone 20 mg to take 3  p.o. daily for 3 days, then 2 p.o. daily for 3 days, then 1 p.o. daily  for 3 days, and a half pill p.o. daily x3 days, then stop, #20 with no  refills.  I told her if she has any further problems to give me a call.     Marne A. Tower, MD  Electronically Signed    MAT/MedQ  DD: 11/03/2006  DT: 11/04/2006  Job #: [id number]

## 2010-08-20 NOTE — Assessment & Plan Note (Signed)
 HEALTHCARE                           GASTROENTEROLOGY OFFICE NOTE   NAME:Lindsay [name]                         MRN:          [id number]  DATE:11/25/2005                            DOB:          [birth year]    Lindsay [name] is having some cramping lower abdominal pain and diarrhea without  rectal bleeding.  She is status post cholecystectomy a year ago.  She also  has continued to have some reflux and has had a previous endoscopy,  esophageal dilatation in May of 2006.  Colonoscopy at that time was  unremarkable except for some melanosis coli changes.  At that time she was  severely constipated and was treated with Zelnorm.   She has had no anorexia, weight loss, denies any specific food intolerances,  recent antibiotic use, or sick family members at home.  She denies abuse of  sorbitol, fructose, or history of lactose intolerance.   PHYSICAL EXAMINATION:  VITAL SIGNS:  Her weight is 141 pounds, blood  pressure 118/64, pulse 76 and regular.  ABDOMEN:  Entirely unremarkable.  RECTAL:  Exam refused.   ASSESSMENT:  1. Chronic acid reflux with need for chronic proton pump inhibitor therapy      per history of previous esophageal stricture.  2. History of constipation, predominant irritable bowel syndrome with      probable element of bile-salt enteropathy at this time.  3. History of melanosis coli.   RECOMMENDATIONS:  1. Trial of Welchol 25 mg in mid a.m. to be taken every other day if      constipation ensues.  2. Reflux regime along with daily AcipHex 20 mg.  3. GI follow-up in 2-3 weeks time and decide if further work-up is needed      depending on clinical course.                                   [name]. Jarold Motto, MD, [facility] [place], [place]   DRP/MedQ  DD:  11/25/2005  DT:  11/25/2005  Job #:  [id number]

## 2010-08-20 NOTE — Op Note (Signed)
Lindsay [name], Lindsay [name]                  ACCOUNT NO.:  [id number]   MEDICAL RECORD NO.:  [id number]          PATIENT TYPE:  INP   LOCATION:  1825                         FACILITY:  [facility]   PHYSICIAN:  Sharlet Salina T. Hoxworth, M.D.DATE OF BIRTH:  1950/04/05   DATE OF PROCEDURE:  05/09/2004  DATE OF DISCHARGE:                                 OPERATIVE REPORT   PREOPERATIVE DIAGNOSIS:  Cholelithiasis and cholecystitis.   POSTOPERATIVE DIAGNOSIS:  Cholelithiasis and cholecystitis.   PROCEDURE:  Laparoscopic cholecystectomy with intraoperative cholangiogram.   SURGEON:  Sharlet Salina T. Hoxworth, M.D.   ASSISTANT:  Currie Paris, M.D.   ANESTHESIA:  General.   BRIEF HISTORY:  Lindsay [name] is a 60 year old female who presents with acute  epigastric and right upper quadrant abdominal pain.  Gallbladder ultrasound  has shown multiple stones and thickening of the gallbladder wall.  Common  bile duct is normal and LFTs were normal.  Urgent laparoscopic  cholecystectomy with cholangiogram has been recommended and accepted.  The  nature of this procedure, indications, risks of bleeding, infection, bile  leak and bile duct injury were discussed and understood She is now brought  to the operating room for this procedure. this procedure.   DESCRIPTION OF OPERATION:  The patient was brought to the operating room and  placed in the supine position upon the operating table and general tracheal  anesthesia was induced.  The abdomen was sterilely prepped and draped.  She  received preoperative antibiotics.  Correct patient and procedure were  verified.  Local anesthesia was used to infiltrate the trocar sites prior to  the incisions.  A 1 cm incision made at the umbilicus and dissection carried  down to the midline fascia, which was sharply incised for 1 cm and the  peritoneum entered under direct vision.  Through a mattress suture of 0  Vicryl, the Hasson trocar was placed and pneumoperitoneum established.   A  standard four-port technique was used under direct vision.  The gallbladder  was somewhat distended and edematous with early acute inflammation.  The  fundus was grasped and elevated up over the infundibulum and retracted  inferolaterally.  Fibrofatty tissue was stripped off the neck of the  gallbladder toward the porta hepatis and Calot's triangle was thoroughly  dissected.  The cystic duct was identified and the cystic duct gallbladder  junction dissected 360 degrees.  The cystic artery was identified at Calot's  triangle.  When the anatomy was clear, the cystic duct was clipped at the  gallbladder junction and operative cholangiogram obtained through the cystic  duct.  This showed good filling of a normal common bile duct and  intrahepatic ducts with free flow of the duodenum and no filling defects.  Following this, the Cholangiocath was removed and the cystic duct was triply  clipped proximally and divided.  The cystic artery was double clipped  proximally, clipped distally and divided.  The gallbladder was dissected  free from its bed using hook cautery.  A posterior branch of the cystic  artery was clipped.  The gallbladder was removed intact, placed in an  EndoCatch bag and brought out through the umbilicus.  Complete hemostasis  assured in the operative site and the right upper quadrant irrigated and  suction until clear.  Trocars were then removed under direct vision  and all CO2 evacuated.  The mattress suture was secured at the umbilicus.  The skin incisions were closed with interrupted subcuticular Monocryl and  Steri-Strips.  Sponge, needle and instrument counts were correct.  Dry  sterile dressings were applied and the patient taken to the recovery room in  good condition.      BTH/MEDQ  D:  05/09/2004  T:  05/10/2004  Job:  [id number]

## 2010-08-20 NOTE — H&P (Signed)
Lindsay [name], Lindsay [name]                  ACCOUNT NO.:  [id number]   MEDICAL RECORD NO.:  [id number]          PATIENT TYPE:  INP   LOCATION:  1825                         FACILITY:  [facility]   PHYSICIAN:  Sharlet Salina T. Hoxworth, M.D.DATE OF BIRTH:  Jun 10, 1950   DATE OF ADMISSION:  05/09/2004  DATE OF DISCHARGE:                                HISTORY & PHYSICAL   CHIEF COMPLAINT:  Right upper quadrant abdominal pain.   HISTORY OF PRESENT ILLNESS:  Ms. Hunkins is a pleasant, generally healthy, 60-  year-old white female who was awakened last night with the onset of severe  pressure-like, epigastric, and right upper quadrant pain radiating to her  right flank and back. This was associated with nausea, but no vomiting. The  pain persisted for a number of hours and she presented to the [facility] emergency  room today. She has had some similar less severe episodes of pain over the  last few years that would spontaneously resolve. She has not had any fever,  chills, or jaundice. She feels well between the episodes.   PAST MEDICAL HISTORY:  Surgery is significant for tonsillectomy, total  abdominal hysterectomy, and two procedures for ovarian cysts. Medically, she  is mild depression and migraine headaches. She has a history of hepatitis A.   CURRENT MEDICATIONS:  1.  Vivelle Dot daily.  2.  Wellbutrin-SR 150 mg b.i.d.  3.  Imitrex p.r.n.   ALLERGIES:  None.   SOCIAL HISTORY:  She is married. She does not smoke cigarettes. Drinks  occasional alcohol.   FAMILY HISTORY:  Noncontributory.   REVIEW OF SYSTEMS:  GENERAL: No fever, chills, nightsweats, or weight  change. RESPIRATORY:  No shortness of breath, cough, or wheezing. CARDIAC:  No chest pain, palpitations, swelling, or history of heart disease. GI: As  above. GU: No urinary burning, frequency, or hematuria. HEMATOLOGIC: No  history of blood clots, abnormal bleeding.   PHYSICAL EXAMINATION:  VITAL SIGNS: Temperature 97.3, pulse 54, respirations  18, blood pressure 103/50. O2 saturation is 99% on room air.  GENERAL: Well-developed white female in no acute distress.  SKIN: Warm and dry.  HEENT:  No scleral icterus. No mass or thyromegaly.  LYMPH NODES: No cervical, supraclavicular, or inguinal nodes palpable.  LUNGS: Clear to auscultation without wheezing or increased work of  breathing.  CARDIAC: Regular rate and rhythm.  No murmurs. No edema. Peripheral pulses  intact.  ABDOMEN: Nondistended. There is localized epigastric and right upper  quadrant tenderness with guarding. No masses or organomegaly.  EXTREMITIES: No joint swelling or deformity.  NEUROLOGIC: Alert and oriented. Motor and sensory exam grossly normal.   LABORATORY DATA:  CBC, LFTs, and electrolytes within normal limits with  minimally elevated lipase of 68.   IMAGING:  Ultrasound of the abdomen shows multiple gallstones and  significant thickening of the gallbladder consistent with acute  cholecystitis.   ASSESSMENT/PLAN:  Cholelithiasis and acute cholecystitis. I recommend  proceeding with urgent laparoscopic cholecystectomy with cholangiogram. The  nature of this procedure, its indications, risks of bleeding, infection,  bile duct and bile duct changes were discussed, and  the patient is agreeable  and will be admitted for this procedure.      BTH/MEDQ  D:  05/09/2004  T:  05/09/2004  Job:  [id number]

## 2010-11-09 ENCOUNTER — Other Ambulatory Visit: Payer: Self-pay | Admitting: Gynecology

## 2010-11-09 DIAGNOSIS — Z1231 Encounter for screening mammogram for malignant neoplasm of breast: Secondary | ICD-10-CM

## 2010-11-12 ENCOUNTER — Ambulatory Visit
Admission: RE | Admit: 2010-11-12 | Discharge: 2010-11-12 | Disposition: A | Discharge status: Home / Self Care | Payer: BC Managed Care – PPO | Source: Ambulatory Visit | Attending: Gynecology | Admitting: Gynecology

## 2010-11-12 DIAGNOSIS — Z1231 Encounter for screening mammogram for malignant neoplasm of breast: Secondary | ICD-10-CM

## 2010-12-01 ENCOUNTER — Other Ambulatory Visit: Payer: Self-pay | Admitting: Gynecology

## 2011-01-04 ENCOUNTER — Other Ambulatory Visit: Payer: Self-pay | Admitting: Internal Medicine

## 2011-01-04 DIAGNOSIS — R51 Headache: Secondary | ICD-10-CM

## 2011-01-04 MED ORDER — SUMATRIPTAN SUCCINATE 100 MG PO TABS: 100.0000 mg | ORAL_TABLET | Freq: Every day | ORAL | Status: DC | PRN

## 2011-01-04 NOTE — Telephone Encounter (Signed)
rx sent in electronically 

## 2011-01-04 NOTE — Telephone Encounter (Signed)
Pt called and is req new script for SUMAtriptan (IMITREX) 100 MG tablet to [facility] [address].

## 2011-01-12 LAB — POCT HEMOGLOBIN-HEMACUE: Operator id: 208731

## 2011-03-04 ENCOUNTER — Ambulatory Visit (INDEPENDENT_AMBULATORY_CARE_PROVIDER_SITE_OTHER): Payer: BC Managed Care – PPO | Admitting: Internal Medicine

## 2011-03-04 ENCOUNTER — Encounter: Payer: Self-pay | Admitting: Internal Medicine

## 2011-03-04 DIAGNOSIS — M542 Cervicalgia: Secondary | ICD-10-CM

## 2011-03-04 DIAGNOSIS — B351 Tinea unguium: Secondary | ICD-10-CM | POA: Insufficient documentation

## 2011-03-04 MED ORDER — TERBINAFINE HCL 250 MG PO TABS: 250.0000 mg | ORAL_TABLET | Freq: Every day | ORAL | Status: DC

## 2011-03-04 MED ORDER — CYCLOBENZAPRINE HCL 5 MG PO TABS: ORAL_TABLET | ORAL | Status: DC

## 2011-03-04 NOTE — Assessment & Plan Note (Signed)
Left great toe onychomycosis.  Treat with lamisil 250 mg x 3 months.

## 2011-03-04 NOTE — Assessment & Plan Note (Addendum)
Patient has been experiencing progressive right cervical radiculopathy symptoms.  Patient declines further imaging or surgical consult at this time. Meloxicam was ineffective. Trial of muscle relaxer and cervical traction.  Patient will reconsider MRI next year if symptoms get worse.  Reassess in 2 months.  F/U with PCP.  I suspect she will eventually end up at neurosurgeon.  Previous cervical x rays in 2011 showed:  IMPRESSION:  Degenerative cervical spondylosis with advanced disc disease and  facet disease for age at C4-5, C5-6 and C6-7. Bony foraminal  narrowing at these levels due to uncinate spurring.

## 2011-03-04 NOTE — Progress Notes (Signed)
Subjective:    Patient ID: Lindsay [name], female    DOB: 01/12/1951, 60 y.o.   MRN: [id number]  HPI  60 year old white female patient of Dr. Cato Mulligan complains of chronic neck discomfort. This has been ongoing for the last 6-8 months. She describes right-sided symptoms with intermittent pain that can be 7/10. She has tingling to right shoulder and has recently developed spasms in the deltoid area. Spasms that can occur when she extends her cervical spine also with grasping objects with extended right arm. Her PCP  suspects possible right cervical radiculopathy however she deferred MRI due to financial reasons. She has tried meloxicam for the last several months without significant improvement.  She also complains of thickened discolored toenail of left great toe.  No improvement with over-the-counter products.  History of migraine headache-she noticed exacerbation while she was taking Mobic on a regular basis. Review of Systems Negative for weakness, hx of right hand numbness (negative EMG)  Past Medical History  Diagnosis Date  . COMMON MIGRAINE 09/20/2006  . DEPRESSION 06/11/2009  . GERD 06/11/2009  . Mood disorder     History   Social History  . Marital Status: Married    Spouse Name: N/A    Number of Children: N/A  . Years of Education: N/A   Occupational History  . Not on file.   Social History Main Topics  . Smoking status: Former Smoker    Types: Cigarettes  . Smokeless tobacco: Not on file  . Alcohol Use:   . Drug Use: No  . Sexually Active:    Other Topics Concern  . Not on file   Social History Narrative  . No narrative on file    Past Surgical History  Procedure Date  . Cholecystectomy   . Abdominal hysterectomy     Family History  Problem Relation Age of Onset  . Dementia Mother   . Osteoporosis Mother   . Breast cancer Sister   . Heart attack Brother     No Known Allergies  Current Outpatient Prescriptions on File Prior to Visit  Medication Sig  Dispense Refill  . aspirin 81 MG tablet Take 81 mg by mouth daily.        Marland Kitchen buPROPion (WELLBUTRIN XL) 150 MG 24 hr tablet Take 150 mg by mouth daily.        . Calcium Carbonate-Vit D-Min (CALCIUM 1200 PO) Take 1 tablet by mouth daily.        . Cholecalciferol (VITAMIN D3) 1000 UNITS capsule Take 1,000 Units by mouth daily.        Marland Kitchen estradiol (VIVELLE-DOT) 0.075 MG/24HR Place 1 patch onto the skin twice a week.        . niacin 250 MG tablet Take 250 mg by mouth daily with breakfast.        . SUMAtriptan (IMITREX) 100 MG tablet Take 1 tablet (100 mg total) by mouth daily as needed.  10 tablet  3  . vitamin E 400 UNIT capsule Take 400 Units by mouth daily.          BP 136/84  Temp(Src) 98.2 F (36.8 C) (Oral)  Wt 146 lb (66.225 kg)       Objective:   Physical Exam  Constitutional: She appears well-developed and well-nourished.  HENT:  Head: Normocephalic and atraumatic.  Neck:       Patient experienced right deltoid spasm with cervical extension, cervical range of motion is relatively normal  Cardiovascular: Normal rate, regular rhythm and normal heart  sounds.   Pulmonary/Chest: Effort normal and breath sounds normal. She has no wheezes.  Neurological:       Upper extremity muscle strength is 5 out of 5 Right brachial reflexes diminished No abnormal sensation  Skin: Skin is warm and dry.       Thickened yellowish toenail-left great toe  Psychiatric: She has a normal mood and affect. Her behavior is normal.          Assessment & Plan:

## 2011-03-04 NOTE — Patient Instructions (Signed)
Perform neck exercises as directed.

## 2011-05-06 ENCOUNTER — Telehealth: Payer: Self-pay | Admitting: Internal Medicine

## 2011-05-06 MED ORDER — CYCLOBENZAPRINE HCL 5 MG PO TABS: ORAL_TABLET | ORAL | Status: DC

## 2011-05-06 NOTE — Telephone Encounter (Signed)
Pt called and was prescribed cyclobenzaprine (FLEXERIL) 5 MG by Dr Artist Pais for neck pain/pinched nerve. Pt said that this med is helping and she would like to get a refill called in to [facility] [place].

## 2011-05-06 NOTE — Telephone Encounter (Signed)
Ok x1 per Dr Artist Pais

## 2011-05-20 ENCOUNTER — Encounter: Payer: Self-pay | Admitting: Family

## 2011-05-20 ENCOUNTER — Ambulatory Visit (INDEPENDENT_AMBULATORY_CARE_PROVIDER_SITE_OTHER): Payer: BC Managed Care – PPO | Admitting: Family

## 2011-05-20 VITALS — BP 124/82 | Temp 98.2°F | Wt 148.0 lb

## 2011-05-20 DIAGNOSIS — D489 Neoplasm of uncertain behavior, unspecified: Secondary | ICD-10-CM

## 2011-05-20 NOTE — Patient Instructions (Signed)
Excision of Skin Lesions Excision of a skin lesion refers to the removal of a section of skin by making small cuts (incisions) in the skin. This is typically done to remove a cancerous growth (basal cell carcinoma, squamous cell carcinoma, or melanoma) or a noncancerous growth (cyst). It may be done to treat or prevent cancer or infection. It may also be done to improve cosmetic appearance (removal of mole, skin tag). LET YOUR CAREGIVER KNOW ABOUT:   Allergies to food or medicine.   Medicines taken, including vitamins, herbs, eyedrops, over-the-counter medicines, and creams.   Use of steroids (by mouth or creams).   Previous problems with anesthetics or numbing medicines.   History of bleeding problems or blood clots.   History of any prostheses.   Previous surgery.   Other health problems, including diabetes and kidney problems.   Possibility of pregnancy, if this applies.  RISKS AND COMPLICATIONS  Many complications can be managed. With appropriate treatment and rehabilitation, the following complications are very uncommon:  Bleeding.   Infection.   Scarring.   Recurrence of cyst or cancer.   Changes in skin sensation or appearance (discoloration, swelling).   Reaction to anesthesia.   Allergic reaction to surgical materials or ointments.   Damage to nerves, blood vessels, muscles, or other structures.   Continued pain.  BEFORE THE PROCEDURE  It is important to follow your caregiver's instructions prior to your procedure to avoid complications. Steps before your procedure may include:  Physical exam, blood tests, other procedures, such as removing a small sample for examination under a microscope (biopsy).   Your caregiver may review the procedure, the anesthesia being used, and what to expect after the procedure with you.  You may be asked to:  Stop taking certain medicines, such as blood thinners (including aspirin, clopidogrel, ibuprofen), for several days prior  to your procedure.   Take certain medicines.   Stop smoking.  It is a good idea to arrange for a ride home after surgery and to have someone to help you with activities during recovery. PROCEDURE  There are several excision techniques. The type of excision or surgical technique used will depend on your condition, the location of the lesion, and your overall health. After the lesion is sterilized and a local anesthetic is applied, the following may be performed: Complete surgical excision The area to be removed is marked with a pen. Using a small scalpel and scissors, the surgeon gently cuts around and under the lesion until it is completely removed. The lesion is placed in a special fluid and sent to the lab for examination. If necessary, bleeding will be controlled with a device that delivers heat. The edges of the wound are stitched together and a dressing is applied. This procedure may be performed to treat a cancerous growth or noncancerous cyst or lesion. Surgeons commonly perform an elliptical excision, to minimize scarring. Excision of a cyst The surgeon makes an incision on the cyst. The entire cyst is removed through the incision. The wound may be closed with a suture (stitch). Shave excision During shave excision, the surgeon uses a small blade or loop instrument to shave off the lesion. This may be done to remove a mole or skin tag. The wound is usually left to heal on its own without stitches. Punch excision During punch excision, the surgeon uses a small, round tool (like a cookie cutter) to cut a circle shape out of the skin. The outer edges of the skin are stitched   together. This may be done to remove a mole or scar or to perform a biopsy of the lesion. Mohs micrographic surgery During Mohs micrographic surgery, layers of the lesion are removed with a scalpel or loop instrument and immediately examined under a microscope until all of the abnormal or cancerous tissue is removed. This  procedure is minimally invasive and ensures the best cosmetic outcome, with removal of as little normal tissue as possible. Mohs is usually done to treat skin cancer, such as basal cell carcinoma or squamous cell carcinoma, particularly on the face and ears. Antibiotic ointment is applied to the surgical area after each of the procedures listed above, as necessary. AFTER THE PROCEDURE  How well you heal depends on many factors. Most patients heal quite well with proper techniques and self-care. Scarring will lessen over time. HOME CARE INSTRUCTIONS   Take medicines for pain as directed.   Keep the incision area clean, dry, and protected for at least 48 hours. Change dressings as directed.   For bleeding, apply gentle but firm pressure to the wound using a folded towel for 20 minutes. Call your caregiver if bleeding does not stop.   Avoid high-impact exercise and activities until the stitches are removed or the area heals.   Follow your caregiver's instructions to minimize scarring. Avoid sun exposure until the area has healed. Scarring should lessen over time.   Follow up with your caregiver as directed. Removal of stitches within 4 to 14 days may be necessary.  Finding out the results of your test Not all test results are available during your visit. If your test results are not back during the visit, make an appointment with your caregiver to find out the results. Do not assume everything is normal if you have not heard from your caregiver or the medical facility. It is important for you to follow up on all of your test results. SEEK MEDICAL CARE IF:   You or your child has an oral temperature above 102 F (38.9 C).   You develop signs of infection (chills, feeling unwell).   You notice bleeding, pain, discharge, redness, or swelling at the incision site.   You notice skin irregularities or changes in sensation.  MAKE SURE YOU:   Understand these instructions.   Will watch your  condition.   Will get help right away if you are not doing well or get worse.  FOR MORE INFORMATION  American Academy of [employer]: www.aafp.org American Academy of Dermatology: www.aad.org Document Released: 06/15/2009 Document Revised: 12/01/2010 Document Reviewed: 06/15/2009 [employer] Patient Information 2012 [employer]. 

## 2011-05-20 NOTE — Progress Notes (Signed)
  Subjective:    Patient ID: Lindsay [name], female    DOB: Jan 30, 1951, 61 y.o.   MRN: [id number]  HPI 61 y/o WF, patient of Dr. Cato Mulligan is on with a lesion on the left chest that has been present x 2 weeks. The lesion is red, raised and irregular. She has no known family history of skin cancer or pmhx of skin cancer.    Review of Systems  Constitutional: Negative.   Respiratory: Negative.   Cardiovascular: Negative.   Skin:       Lesion on left chest x 2 weeks that is growing.       Objective:   Physical Exam  Constitutional: She is oriented to person, place, and time. She appears well-developed and well-nourished.  Cardiovascular: Normal rate, regular rhythm and normal heart sounds.   Pulmonary/Chest: Effort normal and breath sounds normal.  Neurological: She is alert and oriented to person, place, and time.  Skin: There is erythema.       Red,  irregular lesion, rough lesion noted to the left chest.  Psychiatric: She has a normal mood and affect.          Assessment & Plan:  Assessment: Neoplasm of uncertain etiology  Plan: Lesion sent for pathology. Will notified patient pending results.

## 2011-05-20 NOTE — Progress Notes (Signed)
Addended byAdline Mango B on: 05/20/2011 03:08 PM   Modules accepted: Orders

## 2011-10-20 ENCOUNTER — Telehealth: Payer: Self-pay | Admitting: Internal Medicine

## 2011-10-20 NOTE — Telephone Encounter (Signed)
Patient called stating that she would like to speak with the nurse about possibly having an mri done of her neck. Patient does not want to request an mri until she speaks with the nurse as she has questions. Please assist.

## 2011-10-24 NOTE — Telephone Encounter (Signed)
She needs to be seen first--- Ok for her to see padonda--- probably quicker that way

## 2011-10-24 NOTE — Telephone Encounter (Signed)
Pt wanted to see Dr Cato Mulligan, appt scheduled 11/08/11 at 8:45

## 2011-10-24 NOTE — Telephone Encounter (Signed)
Pt was wanting to call insurance company to find out if an MRI was covered.  She also wanted to know if Dr Cato Mulligan would give her a cortisone injection in her shoulder

## 2011-11-08 ENCOUNTER — Ambulatory Visit (INDEPENDENT_AMBULATORY_CARE_PROVIDER_SITE_OTHER): Payer: BC Managed Care – PPO | Admitting: Internal Medicine

## 2011-11-08 ENCOUNTER — Encounter: Payer: Self-pay | Admitting: Internal Medicine

## 2011-11-08 VITALS — BP 126/70 | HR 65 | Temp 98.0°F | Ht 66.0 in | Wt 141.0 lb

## 2011-11-08 DIAGNOSIS — R51 Headache: Secondary | ICD-10-CM

## 2011-11-08 DIAGNOSIS — M542 Cervicalgia: Secondary | ICD-10-CM

## 2011-11-08 MED ORDER — PREDNISONE 20 MG PO TABS: ORAL_TABLET | ORAL | Status: AC

## 2011-11-08 MED ORDER — SUMATRIPTAN SUCCINATE 100 MG PO TABS: 100.0000 mg | ORAL_TABLET | Freq: Every day | ORAL | Status: DC | PRN

## 2011-11-08 NOTE — Progress Notes (Signed)
Patient ID: Lindsay [name], female   DOB: [birth year], 61 y.o.   MRN: [id number]  Right arm pain (shoulder). sxs for > 1 year but sxs have changed from neck/shoulder pain to just involving the arm and shoulder. No specific movements cause pain. Pain might be relieved with massage.  Has not tried any OTC meds  Past Medical History  Diagnosis Date  . COMMON MIGRAINE 09/20/2006  . DEPRESSION 06/11/2009  . GERD 06/11/2009  . Mood disorder     History   Social History  . Marital Status: Married    Spouse Name: N/A    Number of Children: N/A  . Years of Education: N/A   Occupational History  . Not on file.   Social History Main Topics  . Smoking status: Former Smoker    Types: Cigarettes  . Smokeless tobacco: Not on file  . Alcohol Use: No  . Drug Use: No  . Sexually Active: Not on file   Other Topics Concern  . Not on file   Social History Narrative  . No narrative on file    Past Surgical History  Procedure Date  . Cholecystectomy   . Abdominal hysterectomy     Family History  Problem Relation Age of Onset  . Dementia Mother   . Osteoporosis Mother   . Breast cancer Sister   . Heart attack Brother     No Known Allergies  Current Outpatient Prescriptions on File Prior to Visit  Medication Sig Dispense Refill  . aspirin 81 MG tablet Take 81 mg by mouth daily.        Marland Kitchen buPROPion (WELLBUTRIN XL) 150 MG 24 hr tablet Take 150 mg by mouth daily.        . Calcium Carbonate-Vit D-Min (CALCIUM 1200 PO) Take 1 tablet by mouth daily.        . Cholecalciferol (VITAMIN D3) 1000 UNITS capsule Take 1,000 Units by mouth daily.        Marland Kitchen estradiol (VIVELLE-DOT) 0.075 MG/24HR Place 1 patch onto the skin twice a week.        . niacin 250 MG tablet Take 250 mg by mouth daily with breakfast.        . SUMAtriptan (IMITREX) 100 MG tablet Take 1 tablet (100 mg total) by mouth daily as needed.  10 tablet  3  . vitamin E 400 UNIT capsule Take 400 Units by mouth daily.        .  cyclobenzaprine (FLEXERIL) 5 MG tablet One at bedtime as needed  30 tablet  0  . terbinafine (LAMISIL) 250 MG tablet Take 1 tablet (250 mg total) by mouth daily.  30 tablet  2     patient denies chest pain, shortness of breath, orthopnea. Denies lower extremity edema, abdominal pain, change in appetite, change in bowel movements. Patient denies rashes, musculoskeletal complaints. No other specific complaints in a complete review of systems.   BP 126/70  Pulse 65  Temp 98 F (36.7 C) (Oral)  Ht 5\' 6"  (1.676 m)  Wt 141 lb (63.957 kg)  BMI 22.76 kg/m2  SpO2 98% Well-developed female in no acute distress. Neck is supple without lymphadenopathy. There is discomfort with passive movement of her neck when it is fully extended. Chest is clear to auscultation cardiac exam S1-S2 are regular. Extremities no edema. Neurologic exam she is alert and oriented without any motor or sensory deficits. Strength is normal bilaterally. She does have diminished reflexes in the right upper extremity. I'm unable to  elicit a biceps or brachioradialis reflex.

## 2011-11-11 NOTE — Assessment & Plan Note (Signed)
She now has radicular symptoms affecting her right arm. She has diminished reflexes as well. I have recommended an MRI. She would like to avoid that due to the expense. I'll try prednisone taper. She will call me if her symptoms persist and we will schedule an MRI.

## 2011-12-01 ENCOUNTER — Other Ambulatory Visit: Payer: Self-pay | Admitting: Gynecology

## 2011-12-01 DIAGNOSIS — Z1231 Encounter for screening mammogram for malignant neoplasm of breast: Secondary | ICD-10-CM

## 2011-12-02 ENCOUNTER — Other Ambulatory Visit: Payer: Self-pay | Admitting: Internal Medicine

## 2011-12-02 ENCOUNTER — Ambulatory Visit
Admission: RE | Admit: 2011-12-02 | Discharge: 2011-12-02 | Disposition: A | Discharge status: Home / Self Care | Payer: BC Managed Care – PPO | Source: Ambulatory Visit | Attending: Gynecology | Admitting: Gynecology

## 2011-12-02 DIAGNOSIS — Z1231 Encounter for screening mammogram for malignant neoplasm of breast: Secondary | ICD-10-CM

## 2011-12-28 ENCOUNTER — Other Ambulatory Visit (INDEPENDENT_AMBULATORY_CARE_PROVIDER_SITE_OTHER): Payer: BC Managed Care – PPO

## 2011-12-28 DIAGNOSIS — Z0000 Encounter for general adult medical examination without abnormal findings: Secondary | ICD-10-CM

## 2011-12-28 DIAGNOSIS — Z13220 Encounter for screening for lipoid disorders: Secondary | ICD-10-CM

## 2011-12-28 LAB — CBC WITH DIFFERENTIAL/PLATELET
Basophils Absolute: 0 10*3/uL (ref 0.0–0.1)
Eosinophils Relative: 2.9 % (ref 0.0–5.0)
MCV: 96.2 fl (ref 78.0–100.0)
Monocytes Absolute: 0.4 10*3/uL (ref 0.1–1.0)
Neutrophils Relative %: 49.5 % (ref 43.0–77.0)
Platelets: 285 10*3/uL (ref 150.0–400.0)
RDW: 13.1 % (ref 11.5–14.6)
WBC: 4.2 10*3/uL — ABNORMAL LOW (ref 4.5–10.5)

## 2011-12-28 LAB — BASIC METABOLIC PANEL
BUN: 13 mg/dL (ref 6–23)
Chloride: 104 mEq/L (ref 96–112)
Creatinine, Ser: 0.8 mg/dL (ref 0.4–1.2)
GFR: 73.72 mL/min (ref >60.00)

## 2011-12-28 LAB — LIPID PANEL
Cholesterol: 214 mg/dL — ABNORMAL HIGH (ref 0–200)
HDL: 66 mg/dL (ref >39.00)
Triglycerides: 91 mg/dL (ref 0.0–149.0)
VLDL: 18.2 mg/dL (ref 0.0–40.0)

## 2011-12-28 LAB — POCT URINALYSIS DIPSTICK
Ketones, UA: NEGATIVE
Leukocytes, UA: NEGATIVE
Nitrite, UA: NEGATIVE
Protein, UA: NEGATIVE
Urobilinogen, UA: 0.2

## 2011-12-28 LAB — TSH: TSH: 2.52 u[IU]/mL (ref 0.35–5.50)

## 2011-12-28 LAB — HEPATIC FUNCTION PANEL
AST: 17 U/L (ref 0–37)
Total Bilirubin: 0.8 mg/dL (ref 0.3–1.2)

## 2012-01-05 ENCOUNTER — Ambulatory Visit (INDEPENDENT_AMBULATORY_CARE_PROVIDER_SITE_OTHER): Payer: BC Managed Care – PPO | Admitting: Family Medicine

## 2012-01-05 ENCOUNTER — Encounter: Payer: Self-pay | Admitting: Family Medicine

## 2012-01-05 VITALS — BP 120/82 | Temp 98.4°F | Ht 66.0 in | Wt 142.0 lb

## 2012-01-05 DIAGNOSIS — Z23 Encounter for immunization: Secondary | ICD-10-CM

## 2012-01-05 DIAGNOSIS — Z0000 Encounter for general adult medical examination without abnormal findings: Secondary | ICD-10-CM

## 2012-01-05 NOTE — Progress Notes (Signed)
Chief Complaint  Patient presents with  . Establish Care    HPI:  Here for CPE, new patient with me:  -Concerns today: none  -Diet: variety of foods, balance and well rounded, larger portion sizes   -Taking folic acid: no  -Exercise: no  -Diabetes and Dyslipidemia Screening: had labs 9/25 and all looked stable  -Hx of HTN: no  -Vaccines: UTD  -pap history: gets paps with gyn yearly and normal   -FDLMP: s/p hysterectomy  -sexual activity: no  -wants STI testing: no  -FH breast, colon or ovarian ca: see FH  -had colonscopy in 2005 and not due until 2015  -Alcohol, Tobacco, drug use: see social history  -hx of osteoporosis in mother and hysterectomy for fibroids so gets dexa scanning for this  -mammo and breast exams done by gyn an utd and normal  -low risk for hep c and does not want testing today  Review of Systems - CP, SOB, NVD, weight loss, hx of neck pain this is improving with posture - does not want MRI  Needs flu, utd on tdap  Past Medical History  Diagnosis Date  . COMMON MIGRAINE 09/20/2006  . DEPRESSION 06/11/2009  . GERD 06/11/2009  . Mood disorder     Family History  Problem Relation Age of Onset  . Dementia Mother   . Osteoporosis Mother   . Breast cancer Sister   . Heart attack Brother     History   Social History  . Marital Status: Married    Spouse Name: N/A    Number of Children: N/A  . Years of Education: N/A   Social History Main Topics  . Smoking status: Former Smoker    Types: Cigarettes  . Smokeless tobacco: None  . Alcohol Use: No  . Drug Use: No  . Sexually Active: None   Other Topics Concern  . None   Social History Narrative  . None    Current outpatient prescriptions:aspirin 81 MG tablet, Take 81 mg by mouth daily.  , Disp: , Rfl: ;  buPROPion (WELLBUTRIN XL) 150 MG 24 hr tablet, Take 150 mg by mouth daily.  , Disp: , Rfl: ;  Calcium Carbonate-Vit D-Min (CALCIUM 1200 PO), Take 1 tablet by mouth daily.  ,  Disp: , Rfl: ;  Cholecalciferol (VITAMIN D3) 1000 UNITS capsule, Take 1,000 Units by mouth daily.  , Disp: , Rfl:  estradiol (VIVELLE-DOT) 0.075 MG/24HR, Place 1 patch onto the skin twice a week.  , Disp: , Rfl: ;  fish oil-omega-3 fatty acids 1000 MG capsule, Take 1 g by mouth daily., Disp: , Rfl: ;  niacin 250 MG tablet, Take 250 mg by mouth daily with breakfast.  , Disp: , Rfl: ;  SUMAtriptan (IMITREX) 100 MG tablet, Take 1 tablet (100 mg total) by mouth daily as needed., Disp: 10 tablet, Rfl: 3 vitamin E 400 UNIT capsule, Take 400 Units by mouth daily.  , Disp: , Rfl:   EXAM:  Filed Vitals:   01/05/12 1426  BP: 120/82  Temp: 98.4 F (36.9 C)    GENERAL: vitals reviewed and listed below, alert, oriented, appears well hydrated and in no acute distress  HEENT: head atraumatic, PERRLA, normal appearance of eyes, ears, nose and mouth. moist mucus membranes.  NECK: supple, no masses or lymphadenopathy  LUNGS: clear to auscultation bilaterally, no rales, rhonchi or wheeze  CV: HRRR, no peripheral edema or cyanosis, normal pedal pulses  BREAST: refused  ABDOMEN: bowel sounds normal, soft, non tender  to palpation, no masses, no rebound or guarding  GU: refused  RECTAL: refused  SKIN: no rash or abnormal lesions - few SKs chest  MS: normal gait, moves all extremities normally  NEURO: CN II-XII grossly intact, normal muscle strength and sensation to light touch on extremities  PSYCH: normal affect, pleasant and cooperative  ASSESSMENT AND PLAN:  Discussed the following assessment and plan:  1. Encounter for preventive health examination     -healthy 61 yo Female with no concerns today -all uspstf level a and b recommendations discussed -flu today -labs recently checked and fine -dexa, breast health, paps followed by her gyn doctor and utd -follow up in 3-4 months    -Discussed and advised all [place] preventive services health task force level A and B recommendations for  age, sex and risks.  -Advised at least 150 minutes of exercise per week and a healthy diet low in saturated fats and sweets and consisting of fresh fruits and vegetables, lean meats such as fish and white chicken and whole grains.  -labs, studies and vaccines per orders this encounter  No orders of the defined types were placed in this encounter.    There are no Patient Instructions on file for this visit.  Patient advised to return to clinic immediately if symptoms worsen or persist or new concerns.  @LIFEPLAN @  No Follow-up on file.  Kriste Basque R.

## 2012-01-05 NOTE — Patient Instructions (Addendum)
We recommend the following healthy lifestyle measures: - eat a healthy diet consisting of lots of vegetables, fruits, beans, nuts, seeds, healthy meats such as white chicken and fish and whole grains.  - avoid fried foods, fast food, processed foods, sodas, red meet and other fattening foods.  - get a least 150 minutes of aerobic exercise per week.    Follow up in 3-4 months for reflux, depression and history of migraines - please schedule appointment on your way out

## 2012-03-13 ENCOUNTER — Other Ambulatory Visit: Payer: Self-pay | Admitting: Family Medicine

## 2012-03-14 NOTE — Telephone Encounter (Signed)
Pt needs refill on generic wellbutrin xl 150 mg. [facility] [address]

## 2012-03-16 NOTE — Telephone Encounter (Signed)
Spoke with pt and pt states she has been without it all week and is wondering if pt needs the medication.  Pt states she had a couple of months left.  Pt states she is feeling fine right now.  Pt has been on the medication for years. Pt states she takes the SR twice a day.  Pt would like to know what Dr. Selena Batten recommends about pt taking the medication.

## 2012-03-16 NOTE — Telephone Encounter (Signed)
Per Dr. Selena Batten called pt to see how long she had been taking this medication and often.  Left a message for return call.

## 2012-03-19 NOTE — Telephone Encounter (Signed)
Since had been on it for some time, would advise office visit to discuss. We did not get to discuss much the reasons for her taking this at her last visit .If feeling bad off of it def needs appt. Can refill until visit if she wants to keep taking int.

## 2012-03-23 NOTE — Telephone Encounter (Signed)
Called pt and left a detailed message to call back and make an appt to discuss medication.

## 2012-03-23 NOTE — Telephone Encounter (Signed)
Pt states she will probably come in at the beginning of the year due to daughter expecting soon.  Pt states she has not been back on the medication and has not had any side effects.  Pt states she will call the office back and make an appt if she feels she needs to come in.

## 2012-05-02 ENCOUNTER — Other Ambulatory Visit: Payer: Self-pay | Admitting: Gynecology

## 2012-06-25 ENCOUNTER — Ambulatory Visit (INDEPENDENT_AMBULATORY_CARE_PROVIDER_SITE_OTHER): Payer: BC Managed Care – PPO | Admitting: Family Medicine

## 2012-06-25 ENCOUNTER — Encounter: Payer: Self-pay | Admitting: Family Medicine

## 2012-06-25 VITALS — BP 116/76 | HR 92 | Temp 99.5°F | Wt 142.0 lb

## 2012-06-25 DIAGNOSIS — R1013 Epigastric pain: Secondary | ICD-10-CM

## 2012-06-25 DIAGNOSIS — K219 Gastro-esophageal reflux disease without esophagitis: Secondary | ICD-10-CM

## 2012-06-25 MED ORDER — PANTOPRAZOLE SODIUM 40 MG PO TBEC: 40.0000 mg | DELAYED_RELEASE_TABLET | Freq: Every day | ORAL | Status: DC

## 2012-06-25 NOTE — Progress Notes (Signed)
  Subjective:    Patient ID: Lindsay [name], female    DOB: 03-05-1951, 62 y.o.   MRN: [id number]  HPI Here for 2 days of epigastric pains and mild nausea. No vomiting or fever. Her appetite is normal. BMs are normal. She had taken Protonix in the past but stopped.    Review of Systems  Constitutional: Negative.   Respiratory: Negative.   Cardiovascular: Negative.   Gastrointestinal: Positive for nausea and abdominal pain. Negative for vomiting, diarrhea, constipation, blood in stool and abdominal distention.  Genitourinary: Negative.        Objective:   Physical Exam  Constitutional: She appears well-developed and well-nourished. No distress.  Cardiovascular: Normal rate, regular rhythm, normal heart sounds and intact distal pulses.   Pulmonary/Chest: Effort normal and breath sounds normal.  Abdominal: Soft. She exhibits no distension and no mass. There is no rebound and no guarding.  Mildly tender in the epigastrium           Assessment & Plan:  Get back on Protonix daily.

## 2012-08-13 ENCOUNTER — Other Ambulatory Visit: Payer: Self-pay | Admitting: Internal Medicine

## 2012-09-20 ENCOUNTER — Other Ambulatory Visit: Payer: Self-pay | Admitting: Internal Medicine

## 2012-09-21 NOTE — Telephone Encounter (Signed)
Called and spoke with pt and pt states not to worry about medication refill.

## 2012-09-21 NOTE — Telephone Encounter (Signed)
I have not seen this pt in a long time and have not seen her for her migraines. Would advise appt. Can give one refill to get to appt.

## 2012-09-21 NOTE — Telephone Encounter (Signed)
pls advise

## 2012-11-29 ENCOUNTER — Other Ambulatory Visit: Payer: Self-pay | Admitting: Family Medicine

## 2012-11-29 ENCOUNTER — Other Ambulatory Visit: Payer: Self-pay

## 2012-11-29 DIAGNOSIS — Z1231 Encounter for screening mammogram for malignant neoplasm of breast: Secondary | ICD-10-CM

## 2012-12-07 ENCOUNTER — Ambulatory Visit
Admission: RE | Admit: 2012-12-07 | Discharge: 2012-12-07 | Disposition: A | Discharge status: Home / Self Care | Payer: BC Managed Care – PPO | Source: Ambulatory Visit

## 2012-12-07 DIAGNOSIS — Z1231 Encounter for screening mammogram for malignant neoplasm of breast: Secondary | ICD-10-CM

## 2012-12-13 ENCOUNTER — Encounter: Payer: Self-pay | Admitting: Family Medicine

## 2012-12-13 ENCOUNTER — Ambulatory Visit (INDEPENDENT_AMBULATORY_CARE_PROVIDER_SITE_OTHER): Payer: BC Managed Care – PPO | Admitting: Family Medicine

## 2012-12-13 VITALS — BP 122/84 | Temp 97.4°F | Wt 143.0 lb

## 2012-12-13 DIAGNOSIS — K219 Gastro-esophageal reflux disease without esophagitis: Secondary | ICD-10-CM

## 2012-12-13 DIAGNOSIS — M542 Cervicalgia: Secondary | ICD-10-CM

## 2012-12-13 MED ORDER — CYCLOBENZAPRINE HCL 5 MG PO TABS: 5.0000 mg | ORAL_TABLET | Freq: Three times a day (TID) | ORAL | Status: DC | PRN

## 2012-12-13 MED ORDER — RANITIDINE HCL 75 MG PO TABS: 75.0000 mg | ORAL_TABLET | Freq: Two times a day (BID) | ORAL | Status: DC

## 2012-12-13 NOTE — Patient Instructions (Signed)
FOR NECK STRAIN: -heat for 15 minutes twice daily -flexeril for the next 3 days -tylenol 500-1000mg  up to 3 times daily -can use topical capsacin or menthol sports creams  FOR STOMACH ISSUES: -add ranitidine for one month -diet below and follow up if persists  Diet for Gastroesophageal Reflux Disease, Adult Reflux (acid reflux) is when acid from your stomach flows up into the esophagus. When acid comes in contact with the esophagus, the acid causes irritation and soreness (inflammation) in the esophagus. When reflux happens often or so severely that it causes damage to the esophagus, it is called gastroesophageal reflux disease (GERD). Nutrition therapy can help ease the discomfort of GERD. FOODS OR DRINKS TO AVOID OR LIMIT  Smoking or chewing tobacco. Nicotine is one of the most potent stimulants to acid production in the gastrointestinal tract.  Caffeinated and decaffeinated coffee and black tea.  Regular or low-calorie carbonated beverages or energy drinks (caffeine-free carbonated beverages are allowed).   Strong spices, such as black pepper, white pepper, red pepper, cayenne, curry powder, and chili powder.  Peppermint or spearmint.  Chocolate.  High-fat foods, including meats and fried foods. Extra added fats including oils, butter, salad dressings, and nuts. Limit these to less than 8 tsp per day.  Fruits and vegetables if they are not tolerated, such as citrus fruits or tomatoes.  Alcohol.  Any food that seems to aggravate your condition. If you have questions regarding your diet, call your caregiver or a registered dietitian. OTHER THINGS THAT MAY HELP GERD INCLUDE:   Eating your meals slowly, in a relaxed setting.  Eating 5 to 6 small meals per day instead of 3 large meals.  Eliminating food for a period of time if it causes distress.  Not lying down until 3 hours after eating a meal.  Keeping the head of your bed raised 6 to 9 inches (15 to 23 cm) by using a  foam wedge or blocks under the legs of the bed. Lying flat may make symptoms worse.  Being physically active. Weight loss may be helpful in reducing reflux in overweight or obese adults.  Wear loose fitting clothing EXAMPLE MEAL PLAN This meal plan is approximately 2,000 calories based on https://www.bernard.org/ meal planning guidelines. Breakfast   cup cooked oatmeal.  1 cup strawberries.  1 cup low-fat milk.  1 oz almonds. Snack  1 cup cucumber slices.  6 oz yogurt (made from low-fat or fat-free milk). Lunch  2 slice whole-wheat bread.  2 oz sliced [place].  2 tsp mayonnaise.  1 cup blueberries.  1 cup snap peas. Snack  6 whole-wheat crackers.  1 oz string cheese. Dinner   cup brown rice.  1 cup mixed veggies.  1 tsp olive oil.  3 oz grilled fish. Document Released: 03/21/2005 Document Revised: 06/13/2011 Document Reviewed: 02/04/2011 [facility] Patient Information 2014 [place], [place].

## 2012-12-13 NOTE — Progress Notes (Signed)
Chief Complaint  Patient presents with  . Neck Pain  . Nausea    HPI:  Acute visit for:  1)Neck pain: -started years ago and has neck pain on and off for years -this flare started about 1 week ago R posterior neck -worse with certain movements and at work on computer -better with rest  -pain is mild to severe at times, achy pain -denies: fevers, chills, numbness, malaise, HA, weakness or numbness in arms  2)Nausea: -hx of GERD, -today had some heartburn, burping, nausea after coffee and yogurt today - feeling better now -takes prilosec daily for her GERD -ate country style steak and potatoes  ROS: See pertinent positives and negatives per HPI.  Past Medical History  Diagnosis Date  . COMMON MIGRAINE 09/20/2006  . DEPRESSION 06/11/2009  . GERD 06/11/2009  . Mood disorder     Past Surgical History  Procedure Laterality Date  . Cholecystectomy    . Abdominal hysterectomy      Family History  Problem Relation Age of Onset  . Dementia Mother   . Osteoporosis Mother   . Breast cancer Sister   . Heart attack Brother     History   Social History  . Marital Status: Married    Spouse Name: N/A    Number of Children: N/A  . Years of Education: N/A   Social History Main Topics  . Smoking status: Former Smoker    Types: Cigarettes  . Smokeless tobacco: Never Used  . Alcohol Use: Yes     Comment: occ  . Drug Use: No  . Sexual Activity: None   Other Topics Concern  . None   Social History Narrative  . None    Current outpatient prescriptions:aspirin 81 MG tablet, Take 81 mg by mouth daily.  , Disp: , Rfl: ;  buPROPion (WELLBUTRIN SR) 150 MG 12 hr tablet, TAKE 1 TABLET TWICE A DAY, Disp: 180 tablet, Rfl: 0;  Calcium Carbonate-Vit D-Min (CALCIUM 1200 PO), Take 1 tablet by mouth daily.  , Disp: , Rfl: ;  Cholecalciferol (VITAMIN D3) 1000 UNITS capsule, Take 1,000 Units by mouth daily.  , Disp: , Rfl:  estradiol (VIVELLE-DOT) 0.075 MG/24HR, Place 1 patch onto the  skin twice a week.  , Disp: , Rfl: ;  fish oil-omega-3 fatty acids 1000 MG capsule, Take 1 g by mouth daily., Disp: , Rfl: ;  niacin 250 MG tablet, Take 250 mg by mouth daily with breakfast.  , Disp: , Rfl: ;  pantoprazole (PROTONIX) 40 MG tablet, Take 1 tablet (40 mg total) by mouth daily., Disp: 30 tablet, Rfl: 11 vitamin E 400 UNIT capsule, Take 400 Units by mouth daily.  , Disp: , Rfl: ;  cyclobenzaprine (FLEXERIL) 5 MG tablet, Take 1 tablet (5 mg total) by mouth 3 (three) times daily as needed for muscle spasms., Disp: 20 tablet, Rfl: 0;  ranitidine (ZANTAC) 75 MG tablet, Take 1 tablet (75 mg total) by mouth 2 (two) times daily., Disp: 60 tablet, Rfl: 0  EXAM:  Filed Vitals:   12/13/12 1348  BP: 122/84  Temp: 97.4 F (36.3 C)    Body mass index is 23.09 kg/(m^2).  GENERAL: vitals reviewed and listed above, alert, oriented, appears well hydrated and in no acute distress  HEENT: atraumatic, conjunttiva clear, no obvious abnormalities on inspection of external nose and ears  NECK: no obvious masses on inspection, normal ROM, no bony TTP, neg spurlings, TTP R trapezius muscle with R trap muscle tension, normal sensation and  strength in upper extremities bilaterally, no meningeal signs  LUNGS: clear to auscultation bilaterally, no wheezes, rales or rhonchi, good air movement  CV: HRRR, no peripheral edema  ABD: soft, BS normal, NTTP  MS: moves all extremities without noticeable abnormality  PSYCH: pleasant and cooperative, no obvious depression or anxiety  ASSESSMENT AND PLAN:  Discussed the following assessment and plan:  Neck pain on right side - Plan: cyclobenzaprine (FLEXERIL) 5 MG tablet  GERD (gastroesophageal reflux disease) - Plan: ranitidine (ZANTAC) 75 MG tablet  -Recommendations per orders an instructions, risks and use of medications and return precautions discussed. -Patient advised to return or notify a doctor immediately if symptoms worsen or persist or new  concerns arise.  Patient Instructions  FOR NECK STRAIN: -heat for 15 minutes twice daily -flexeril for the next 3 days -tylenol 500-1000mg  up to 3 times daily -can use topical capsacin or menthol sports creams  FOR STOMACH ISSUES: -add ranitidine for one month -diet below and follow up if persists  Diet for Gastroesophageal Reflux Disease, Adult Reflux (acid reflux) is when acid from your stomach flows up into the esophagus. When acid comes in contact with the esophagus, the acid causes irritation and soreness (inflammation) in the esophagus. When reflux happens often or so severely that it causes damage to the esophagus, it is called gastroesophageal reflux disease (GERD). Nutrition therapy can help ease the discomfort of GERD. FOODS OR DRINKS TO AVOID OR LIMIT  Smoking or chewing tobacco. Nicotine is one of the most potent stimulants to acid production in the gastrointestinal tract.  Caffeinated and decaffeinated coffee and black tea.  Regular or low-calorie carbonated beverages or energy drinks (caffeine-free carbonated beverages are allowed).   Strong spices, such as black pepper, white pepper, red pepper, cayenne, curry powder, and chili powder.  Peppermint or spearmint.  Chocolate.  High-fat foods, including meats and fried foods. Extra added fats including oils, butter, salad dressings, and nuts. Limit these to less than 8 tsp per day.  Fruits and vegetables if they are not tolerated, such as citrus fruits or tomatoes.  Alcohol.  Any food that seems to aggravate your condition. If you have questions regarding your diet, call your caregiver or a registered dietitian. OTHER THINGS THAT MAY HELP GERD INCLUDE:   Eating your meals slowly, in a relaxed setting.  Eating 5 to 6 small meals per day instead of 3 large meals.  Eliminating food for a period of time if it causes distress.  Not lying down until 3 hours after eating a meal.  Keeping the head of your bed raised  6 to 9 inches (15 to 23 cm) by using a foam wedge or blocks under the legs of the bed. Lying flat may make symptoms worse.  Being physically active. Weight loss may be helpful in reducing reflux in overweight or obese adults.  Wear loose fitting clothing EXAMPLE MEAL PLAN This meal plan is approximately 2,000 calories based on https://www.[facility]nard.org/ meal planning guidelines. Breakfast   cup cooked oatmeal.  1 cup strawberries.  1 cup low-fat milk.  1 oz almonds. Snack  1 cup cucumber slices.  6 oz yogurt (made from low-fat or fat-free milk). Lunch  2 slice whole-wheat bread.  2 oz sliced [place].  2 tsp mayonnaise.  1 cup blueberries.  1 cup snap peas. Snack  6 whole-wheat crackers.  1 oz string cheese. Dinner   cup brown rice.  1 cup mixed veggies.  1 tsp olive oil.  3 oz grilled fish. Document  Released: 03/21/2005 Document Revised: 06/13/2011 Document Reviewed: 02/04/2011 [facility] Patient Information 2014 [place], [place], Dahlia Client R.

## 2012-12-18 ENCOUNTER — Telehealth: Payer: Self-pay | Admitting: Family Medicine

## 2012-12-18 DIAGNOSIS — M542 Cervicalgia: Secondary | ICD-10-CM

## 2012-12-18 NOTE — Telephone Encounter (Signed)
Pls advise.  

## 2012-12-18 NOTE — Telephone Encounter (Signed)
She was only supposed to use this a most 3 times per day. Can refill with #10. Ensure doing heat, topical sports creams and exercises. If not better in next few weeks would advise she see PMR.

## 2012-12-18 NOTE — Telephone Encounter (Signed)
Pt called and stated that she has had little improvement with her neck pain. She is requesting a refill of her cyclobenzaprine (FLEXERIL) 5 MG tablet. She states that tylenol gives her little relief, and she is still in consistent pain. She would like [facility] called into [facility] on [address]. Please assist.

## 2012-12-19 MED ORDER — CYCLOBENZAPRINE HCL 5 MG PO TABS: 5.0000 mg | ORAL_TABLET | Freq: Three times a day (TID) | ORAL | Status: DC | PRN

## 2012-12-19 NOTE — Telephone Encounter (Signed)
Pt states she has only been taking them 3 times day and there is improvement but it is still very sore.  Pt has been doing the exercises given and Tylenol. Pt is aware of Dr. Elmyra Ricks recommendations. Pt advised to call back if referral is needed.

## 2012-12-19 NOTE — Telephone Encounter (Signed)
pls call pt w/ results of this encounter! Thanks! [phone]

## 2013-02-07 ENCOUNTER — Other Ambulatory Visit: Payer: Self-pay

## 2013-03-15 ENCOUNTER — Encounter: Payer: Self-pay | Admitting: Family Medicine

## 2013-03-15 ENCOUNTER — Ambulatory Visit (INDEPENDENT_AMBULATORY_CARE_PROVIDER_SITE_OTHER): Payer: BC Managed Care – PPO | Admitting: Family Medicine

## 2013-03-15 VITALS — BP 120/80 | HR 71 | Temp 97.9°F | Resp 18 | Wt 141.0 lb

## 2013-03-15 DIAGNOSIS — R21 Rash and other nonspecific skin eruption: Secondary | ICD-10-CM

## 2013-03-15 DIAGNOSIS — L821 Other seborrheic keratosis: Secondary | ICD-10-CM

## 2013-03-15 MED ORDER — TRETINOIN (EMOLLIENT) 0.05 % EX CREA: TOPICAL_CREAM | CUTANEOUS | Status: DC

## 2013-03-15 NOTE — Progress Notes (Signed)
Chief Complaint  Patient presents with  . c/o rough spot on left breast  . breakout on chin    HPI:  Acute visit for:  Rough skin: -L breast -outbreak  Skin rash: -chin   ROS: See pertinent positives and negatives per HPI.  Past Medical History  Diagnosis Date  . COMMON MIGRAINE 09/20/2006  . DEPRESSION 06/11/2009  . GERD 06/11/2009  . Mood disorder     Past Surgical History  Procedure Laterality Date  . Cholecystectomy    . Abdominal hysterectomy      Family History  Problem Relation Age of Onset  . Dementia Mother   . Osteoporosis Mother   . Breast cancer Sister   . Heart attack Brother     History   Social History  . Marital Status: Married    Spouse Name: N/A    Number of Children: N/A  . Years of Education: N/A   Social History Main Topics  . Smoking status: Former Smoker    Types: Cigarettes  . Smokeless tobacco: Never Used  . Alcohol Use: Yes     Comment: occ  . Drug Use: No  . Sexual Activity: None   Other Topics Concern  . None   Social History Narrative  . None    Current outpatient prescriptions:aspirin 81 MG tablet, Take 81 mg by mouth daily.  , Disp: , Rfl: ;  buPROPion (WELLBUTRIN SR) 150 MG 12 hr tablet, TAKE 1 TABLET TWICE A DAY, Disp: 180 tablet, Rfl: 0;  Calcium Carbonate-Vit D-Min (CALCIUM 1200 PO), Take 1 tablet by mouth daily.  , Disp: , Rfl: ;  Cholecalciferol (VITAMIN D3) 1000 UNITS capsule, Take 1,000 Units by mouth daily.  , Disp: , Rfl:  estradiol (VIVELLE-DOT) 0.075 MG/24HR, Place 1 patch onto the skin twice a week.  , Disp: , Rfl: ;  fish oil-omega-3 fatty acids 1000 MG capsule, Take 1 g by mouth daily., Disp: , Rfl: ;  niacin 250 MG tablet, Take 250 mg by mouth daily with breakfast.  , Disp: , Rfl: ;  pantoprazole (PROTONIX) 40 MG tablet, Take 1 tablet (40 mg total) by mouth daily., Disp: 30 tablet, Rfl: 11 SUMAtriptan (IMITREX) 100 MG tablet, Take 100 mg by mouth every 2 (two) hours as needed for migraine or headache. May  repeat in 2 hours if headache persists or recurs., Disp: , Rfl: ;  vitamin E 400 UNIT capsule, Take 400 Units by mouth daily.  , Disp: , Rfl: ;  Tretinoin, Facial Wrinkles, (TRETINOIN, EMOLLIENT,) 0.05 % CREA, Apply a small amount to affected area nightly, Disp: 10 g, Rfl: 0  EXAM:  Filed Vitals:   03/15/13 1341  BP: 120/80  Pulse: 71  Temp: 97.9 F (36.6 C)  Resp: 18    Body mass index is 22.77 kg/(m^2).  GENERAL: vitals reviewed and listed above, alert, oriented, appears well hydrated and in no acute distress  HEENT: atraumatic, conjunttiva clear, no obvious abnormalities on inspection of external nose and ears  NECK: no obvious masses on inspection  Breast: SK  SKIN: few erythematous papules on chin  MS: moves all extremities without noticeable abnormality  PSYCH: pleasant and cooperative, no obvious depression or anxiety  ASSESSMENT AND PLAN:  Discussed the following assessment and plan:  Rash and nonspecific skin eruption - Plan: Tretinoin, Facial Wrinkles, (TRETINOIN, EMOLLIENT,) 0.05 % CREA  Seborrheic keratoses  -we discussed possible serious and likely etiologies, workup and treatment, treatment risks and return precautions -after this discussion, Margy opted for  tx here instead of seeing dermatologist. -follow up advised in one month -of course, we advised Addilynn  to return or notify a doctor immediately if symptoms worsen or persist or new concerns arise.  .  -Patient advised to return or notify a doctor immediately if symptoms worsen or persist or new concerns arise.  Patient Instructions  Use benzoyl peroxide wash on face daily and cerave night or cetaphil restoraderm moisterizer Use the prescription cream nightly -these can be drying - so if skin is getting irritated go to every other day  Follow up in 1 month     Keyston Ardolino R.

## 2013-03-15 NOTE — Progress Notes (Signed)
Pre-visit discussion using our clinic review tool. No additional management support is needed unless otherwise documented below in the visit note.  

## 2013-03-15 NOTE — Patient Instructions (Signed)
Use benzoyl peroxide wash on face daily and cerave night or cetaphil restoraderm moisterizer Use the prescription cream nightly -these can be drying - so if skin is getting irritated go to every other day  Follow up in 1 month

## 2013-03-18 ENCOUNTER — Telehealth: Payer: Self-pay | Admitting: Family Medicine

## 2013-03-18 NOTE — Telephone Encounter (Signed)
Pt states pharmacy has had a hard time send prior authorization request for Tretinoin, Facial Wrinkles, (TRETINOIN, EMOLLIENT,) 0.05 % CREA.  She would like to check to see if our office has received the request.  Pt is aware of the PA process and knows that it will take a few days to process.  She just wants to know we have received it.

## 2013-03-22 ENCOUNTER — Telehealth: Payer: Self-pay | Admitting: Family Medicine

## 2013-03-22 DIAGNOSIS — L700 Acne vulgaris: Secondary | ICD-10-CM | POA: Insufficient documentation

## 2013-03-22 NOTE — Telephone Encounter (Signed)
I received a fax from [facility] [place] denying Tretinoin, pt must have a diagnosis of vulgaris, actinic keratoses and icthyoses.  Please advise

## 2013-03-22 NOTE — Telephone Encounter (Signed)
I have re-submitted the PA with the dx changed.

## 2013-03-22 NOTE — Telephone Encounter (Signed)
Please complete with acne vulgaris. Other dx do not apply.

## 2013-05-22 ENCOUNTER — Ambulatory Visit (INDEPENDENT_AMBULATORY_CARE_PROVIDER_SITE_OTHER): Payer: BC Managed Care – PPO | Admitting: Family Medicine

## 2013-05-22 ENCOUNTER — Encounter: Payer: Self-pay | Admitting: Family Medicine

## 2013-05-22 VITALS — BP 102/82 | Temp 98.7°F | Wt 141.0 lb

## 2013-05-22 DIAGNOSIS — F329 Major depressive disorder, single episode, unspecified: Secondary | ICD-10-CM

## 2013-05-22 DIAGNOSIS — R82998 Other abnormal findings in urine: Secondary | ICD-10-CM

## 2013-05-22 DIAGNOSIS — K529 Noninfective gastroenteritis and colitis, unspecified: Secondary | ICD-10-CM

## 2013-05-22 DIAGNOSIS — Z0000 Encounter for general adult medical examination without abnormal findings: Secondary | ICD-10-CM

## 2013-05-22 DIAGNOSIS — G43909 Migraine, unspecified, not intractable, without status migrainosus: Secondary | ICD-10-CM

## 2013-05-22 DIAGNOSIS — R8290 Unspecified abnormal findings in urine: Secondary | ICD-10-CM

## 2013-05-22 DIAGNOSIS — F32A Depression, unspecified: Secondary | ICD-10-CM

## 2013-05-22 DIAGNOSIS — M542 Cervicalgia: Secondary | ICD-10-CM

## 2013-05-22 DIAGNOSIS — F3289 Other specified depressive episodes: Secondary | ICD-10-CM

## 2013-05-22 DIAGNOSIS — K5289 Other specified noninfective gastroenteritis and colitis: Secondary | ICD-10-CM

## 2013-05-22 DIAGNOSIS — N951 Menopausal and female climacteric states: Secondary | ICD-10-CM

## 2013-05-22 LAB — TSH: TSH: 1.67 u[IU]/mL (ref 0.35–5.50)

## 2013-05-22 LAB — URINALYSIS, ROUTINE W REFLEX MICROSCOPIC
HGB URINE DIPSTICK: NEGATIVE
Ketones, ur: 15 — AB
Leukocytes, UA: NEGATIVE
NITRITE: NEGATIVE
PH: 6 (ref 5.0–8.0)
SPECIFIC GRAVITY, URINE: 1.025 (ref 1.000–1.030)
TOTAL PROTEIN, URINE-UPE24: NEGATIVE
Urine Glucose: NEGATIVE
Urobilinogen, UA: 0.2 (ref 0.0–1.0)

## 2013-05-22 LAB — T4, FREE: Free T4: 0.98 ng/dL (ref 0.60–1.60)

## 2013-05-22 LAB — HEMOGLOBIN A1C: Hgb A1c MFr Bld: 5.5 % (ref 4.6–6.5)

## 2013-05-22 LAB — LIPID PANEL
CHOLESTEROL: 190 mg/dL (ref 0–200)
HDL: 68.7 mg/dL (ref >39.00)
LDL Cholesterol: 110 mg/dL — ABNORMAL HIGH (ref 0–99)
TRIGLYCERIDES: 55 mg/dL (ref 0.0–149.0)
Total CHOL/HDL Ratio: 3
VLDL: 11 mg/dL (ref 0.0–40.0)

## 2013-05-22 MED ORDER — ESTRADIOL 0.075 MG/24HR TD PTTW: 1.0000 | MEDICATED_PATCH | TRANSDERMAL | Status: DC

## 2013-05-22 MED ORDER — BUPROPION HCL ER (SR) 150 MG PO TB12: 150.0000 mg | ORAL_TABLET | Freq: Two times a day (BID) | ORAL | Status: DC

## 2013-05-22 MED ORDER — SUMATRIPTAN SUCCINATE 100 MG PO TABS: 100.0000 mg | ORAL_TABLET | ORAL | Status: DC | PRN

## 2013-05-22 MED ORDER — NAPROXEN 500 MG PO TABS: 500.0000 mg | ORAL_TABLET | Freq: Two times a day (BID) | ORAL | Status: DC

## 2013-05-22 MED ORDER — ONDANSETRON HCL 4 MG PO TABS: 4.0000 mg | ORAL_TABLET | Freq: Three times a day (TID) | ORAL | Status: DC | PRN

## 2013-05-22 NOTE — Progress Notes (Signed)
Pre visit review using our clinic review tool, if applicable. No additional management support is needed unless otherwise documented below in the visit note. 

## 2013-05-22 NOTE — Progress Notes (Addendum)
Chief Complaint  Patient presents with  . Annual Exam  . Nausea    HPI:  Here for CPE:  -Concerns today:   Depression: -on wellbutrin -denies: SI, thought of self harm -stable  Acne: -tretinoin top -better when uses tx, but dries skin so uses occ  Migraine: -stable  Gastroenteritis: -started last night -going thru her house - granddaughter and son have it -now she has nausea, no diarrhea or vomiting yet  -Diet: variety of foods, balance and well rounded  -Vitamin D and calcium:  yes  -Exercise: no regular exercise  -Diabetes and Dyslipidemia Screening:  -Hx of HTN: no  -Vaccines: UTD  -pap history: 05/02/12, normal - sees gyn  -FDLMP: N/A postmenopausal and s/p hysterectomy   -sexual activity: no  -wants STI testing: no  -FH breast, colon or ovarian ca: see FH -norma mammo in 11/2012  -Alcohol, Tobacco, drug use: see social history  Review of Systems - negative except where noted above  Past Medical History  Diagnosis Date  . COMMON MIGRAINE 09/20/2006  . DEPRESSION 06/11/2009  . GERD 06/11/2009  . Mood disorder     Past Surgical History  Procedure Laterality Date  . Cholecystectomy    . Abdominal hysterectomy      Family History  Problem Relation Age of Onset  . Dementia Mother   . Osteoporosis Mother   . Breast cancer Sister   . Heart attack Brother     History   Social History  . Marital Status: Married    Spouse Name: N/A    Number of Children: N/A  . Years of Education: N/A   Social History Main Topics  . Smoking status: Former Smoker    Types: Cigarettes  . Smokeless tobacco: Never Used  . Alcohol Use: Yes     Comment: occ  . Drug Use: No  . Sexual Activity: None   Other Topics Concern  . None   Social History Narrative  . None    Current outpatient prescriptions:aspirin 81 MG tablet, Take 81 mg by mouth daily.  , Disp: , Rfl: ;  buPROPion (WELLBUTRIN SR) 150 MG 12 hr tablet, Take 1 tablet (150 mg total) by mouth 2  (two) times daily., Disp: 180 tablet, Rfl: 1;  Calcium Carbonate-Vit D-Min (CALCIUM 1200 PO), Take 1 tablet by mouth daily.  , Disp: , Rfl: ;  Cholecalciferol (VITAMIN D3) 1000 UNITS capsule, Take 1,000 Units by mouth daily.  , Disp: , Rfl:  estradiol (VIVELLE-DOT) 0.075 MG/24HR, Place 1 patch onto the skin 2 (two) times a week., Disp: 8 patch, Rfl: 1;  fish oil-omega-3 fatty acids 1000 MG capsule, Take 1 g by mouth daily., Disp: , Rfl: ;  niacin 250 MG tablet, Take 250 mg by mouth daily with breakfast.  , Disp: , Rfl: ;  pantoprazole (PROTONIX) 40 MG tablet, Take 1 tablet (40 mg total) by mouth daily., Disp: 30 tablet, Rfl: 11 SUMAtriptan (IMITREX) 100 MG tablet, Take 1 tablet (100 mg total) by mouth every 2 (two) hours as needed for migraine or headache. May repeat in 2 hours if headache persists or recurs., Disp: 10 tablet, Rfl: 1;  Tretinoin, Facial Wrinkles, (TRETINOIN, EMOLLIENT,) 0.05 % CREA, Apply a small amount to affected area nightly, Disp: 10 g, Rfl: 0;  vitamin E 400 UNIT capsule, Take 400 Units by mouth daily.  , Disp: , Rfl:  naproxen (NAPROSYN) 500 MG tablet, Take 1 tablet (500 mg total) by mouth 2 (two) times daily with a meal., Disp:  30 tablet, Rfl: 0;  ondansetron (ZOFRAN) 4 MG tablet, Take 1 tablet (4 mg total) by mouth every 8 (eight) hours as needed for nausea or vomiting., Disp: 20 tablet, Rfl: 0  EXAM:  Filed Vitals:   05/22/13 1001  BP: 102/82  Temp: 98.7 F (37.1 C)    GENERAL: vitals reviewed and listed below, alert, oriented, appears well hydrated and in no acute distress  HEENT: head atraumatic, PERRLA, normal appearance of eyes, ears, nose and mouth. moist mucus membranes.  NECK: supple, no masses or lymphadenopathy  LUNGS: clear to auscultation bilaterally, no rales, rhonchi or wheeze  CV: HRRR, no peripheral edema or cyanosis, normal pedal pulses  BREAST: normal appearance - no lesions or discharge, on palpation normal breast tissue without any suspicious  masses  ABDOMEN: bowel sounds normal, soft, non tender to palpation, no masses, no rebound or guarding  GU: deferred - done with gyn  RECTAL: refused  SKIN: small area of irritated skin R chin  MS: normal gait, moves all extremities normally  NEURO: CN II-XII grossly intact, normal muscle strength and sensation to light touch on extremities  PSYCH: normal affect, pleasant and cooperative  ASSESSMENT AND PLAN:  Discussed the following assessment and plan:  Visit for preventive health examination - Plan: ondansetron (ZOFRAN) 4 MG tablet, Lipid panel, TSH, T4, Free, Hemoglobin A1c  Neck pain - Plan: naproxen (NAPROSYN) 500 MG tablet  Gastroenteritis  Hot flash, menopausal - Plan: estradiol (VIVELLE-DOT) 0.075 MG/24HR  Depression - Plan: buPROPion (WELLBUTRIN SR) 150 MG 12 hr tablet  Migraine - Plan: SUMAtriptan (IMITREX) 100 MG tablet  Bad odor of urine - Plan: POC urinalysis w microscopic (non auto)   -Discussed and advised all [place] preventive services health task force level A and B recommendations for age, sex and risks.  -Advised at least 150 minutes of exercise per week and a healthy diet low in saturated fats and sweets and consisting of fresh fruits and vegetables, lean meats such as fish and white chicken and whole grains.  -refilled meds  -on leaving complained of odor to urine for 2 days - wants to check urine for infection  -oral rehydration, zofran and imodium advised for gastroenteritis with return precautions discussed  -labs, studies and vaccines per orders this encounter  Orders Placed This Encounter  Procedures  . Lipid panel  . TSH  . T4, Free  . Hemoglobin A1c  . POC urinalysis w microscopic (non auto)    Patient Instructions  -We have ordered labs or studies at this visit. It can take up to 1-2 weeks for results and processing. We will contact you with instructions IF your results are abnormal. Normal results will be released to your [facility]. If  you have not heard from [place] or can not find your results in [facility] in 2 weeks please contact our office.  -PLEASE SIGN UP FOR MYCHART TODAY   We recommend the following healthy lifestyle measures: - eat a healthy diet consisting of lots of vegetables, fruits, beans, nuts, seeds, healthy meats such as white chicken and fish and whole grains.  - avoid fried foods, fast food, processed foods, sodas, red meet and other fattening foods.  - get a least 150 minutes of aerobic exercise per week.   -see dermatologist if skin issues persist on the face  Follow up in: 6 month and/or as needed     Patient advised to return to clinic immediately if symptoms worsen or persist or new concerns.    No  Follow-up on file.  Colin Benton R.

## 2013-05-22 NOTE — Patient Instructions (Addendum)
-  We have ordered labs or studies at this visit. It can take up to 1-2 weeks for results and processing. We will contact you with instructions IF your results are abnormal. Normal results will be released to your [facility]. If you have not heard from [place] or can not find your results in [facility] in 2 weeks please contact our office.  -PLEASE SIGN UP FOR MYCHART TODAY   We recommend the following healthy lifestyle measures: - eat a healthy diet consisting of lots of vegetables, fruits, beans, nuts, seeds, healthy meats such as white chicken and fish and whole grains.  - avoid fried foods, fast food, processed foods, sodas, red meet and other fattening foods.  - get a least 150 minutes of aerobic exercise per week.   -see dermatologist if skin issues persist on the face  Follow up in: 6 month and/or as needed

## 2013-05-22 NOTE — Addendum Note (Signed)
Addended by: Colleen Can on: 05/22/2013 10:46 AM   Modules accepted: Orders

## 2013-05-24 ENCOUNTER — Other Ambulatory Visit (INDEPENDENT_AMBULATORY_CARE_PROVIDER_SITE_OTHER): Payer: BC Managed Care – PPO

## 2013-05-24 ENCOUNTER — Other Ambulatory Visit: Payer: Self-pay | Admitting: Family Medicine

## 2013-05-24 DIAGNOSIS — R8290 Unspecified abnormal findings in urine: Secondary | ICD-10-CM

## 2013-05-24 DIAGNOSIS — R82998 Other abnormal findings in urine: Secondary | ICD-10-CM

## 2013-05-24 LAB — URINALYSIS, ROUTINE W REFLEX MICROSCOPIC
Bilirubin Urine: NEGATIVE
HGB URINE DIPSTICK: NEGATIVE
Leukocytes, UA: NEGATIVE
Nitrite: NEGATIVE
Specific Gravity, Urine: >=1.03 — AB (ref 1.000–1.030)
TOTAL PROTEIN, URINE-UPE24: NEGATIVE
URINE GLUCOSE: NEGATIVE
Urobilinogen, UA: 0.2 (ref 0.0–1.0)
WBC, UA: NONE SEEN (ref 0–?)
pH: 6 (ref 5.0–8.0)

## 2013-05-26 LAB — URINE CULTURE

## 2013-05-27 ENCOUNTER — Other Ambulatory Visit: Payer: Self-pay

## 2013-05-27 DIAGNOSIS — R8290 Unspecified abnormal findings in urine: Secondary | ICD-10-CM

## 2013-06-28 ENCOUNTER — Other Ambulatory Visit (INDEPENDENT_AMBULATORY_CARE_PROVIDER_SITE_OTHER): Payer: BC Managed Care – PPO

## 2013-06-28 DIAGNOSIS — R8290 Unspecified abnormal findings in urine: Secondary | ICD-10-CM

## 2013-06-28 DIAGNOSIS — R82998 Other abnormal findings in urine: Secondary | ICD-10-CM

## 2013-06-28 LAB — URINALYSIS, ROUTINE W REFLEX MICROSCOPIC
Bilirubin Urine: NEGATIVE
HGB URINE DIPSTICK: NEGATIVE
Ketones, ur: NEGATIVE
LEUKOCYTES UA: NEGATIVE
NITRITE: NEGATIVE
Specific Gravity, Urine: 1.01 (ref 1.000–1.030)
Total Protein, Urine: NEGATIVE
UROBILINOGEN UA: 0.2 (ref 0.0–1.0)
Urine Glucose: NEGATIVE
pH: 8 (ref 5.0–8.0)

## 2013-07-26 ENCOUNTER — Encounter: Payer: Self-pay | Admitting: Gastroenterology

## 2013-12-16 ENCOUNTER — Other Ambulatory Visit: Payer: Self-pay | Admitting: Family Medicine

## 2013-12-16 ENCOUNTER — Telehealth: Payer: Self-pay | Admitting: Family Medicine

## 2013-12-16 NOTE — Telephone Encounter (Signed)
Rx done. 

## 2013-12-16 NOTE — Telephone Encounter (Signed)
Pharm called to request estradiol (VIVELLE-DOT) 0.075 MG/24HR  patch Pt is out and would like asap.

## 2013-12-17 ENCOUNTER — Other Ambulatory Visit: Payer: Self-pay | Admitting: Family Medicine

## 2013-12-17 DIAGNOSIS — F329 Major depressive disorder, single episode, unspecified: Secondary | ICD-10-CM

## 2013-12-17 DIAGNOSIS — F3289 Other specified depressive episodes: Secondary | ICD-10-CM

## 2013-12-18 NOTE — Telephone Encounter (Signed)
The pharmacy sent a refill request for Wellbutrin.  I spoke with the pt and advised her she needs to be seen as the last office visit from 2/18 recommends she follow up in 6 months. She asked why she needed to follow up as she has been on this medication for years and Dr Leanne Chang never requested her to come and be seen.  Message forwarded to Dr Maudie Mercury to call the pt on Thursday when she returns to the office. Please call pts cell number [phone].

## 2013-12-18 NOTE — Telephone Encounter (Signed)
I left a message for the pt to return my call as she needs an appt.

## 2013-12-19 NOTE — Telephone Encounter (Signed)
I called the pt and informed her of the message below and she laughed and said this is ridiculous to be seen this often for an antidepressant and she knows you should not stop taking this medication but she will take her chances and I advised her this is because of the care of the pt and she stated maybe Dr Maudie Mercury is not the doctor for her of this is the case.  Stated she has been coming here for years and it is only Wellbutrin she is taking, it is not a blood pressure medication or something else.  I offered to make an appt for the pt in the next 2 months and I told the pt I am not sure what to tell her at this point and she stated she thought Dr Maudie Mercury would be calling her back and I advised the pt the message will be forwarded to Dr Maudie Mercury.

## 2013-12-19 NOTE — Telephone Encounter (Signed)
I advise follow up on a regular basis to all patients taking antidepressants based on current standards of care in medicine. Usually, more frequently then every 6 months, unless very stable. This is to assess need for medication, change, etc. Dr. Leanne Chang was not available to provide this type of care as was often not here. Advise follow up in the next 2 months - refill to appt. Ok.

## 2013-12-19 NOTE — Telephone Encounter (Signed)
I am concerned about this pt given her odd response to advice for a follow up appt. Called her to assess situation:  She is upset as prior pcp refilled with only seeing her annually or less.  Advised I recommend more frequent f/u to most pts, but given she is stable will assess over the phone.  Did stop this medication once and got antsy and irritable - so wishes to continue. Doing well. No current depression. Sleep disorder: no Interest deficit/anhedonia: no Guilt (worthlessness, hopelessness, regret): no Energy deficit:  on Concentration deficit:no Appetite disorder: no Psychomotor retardation or agitation: no Suicidality: no  Stable mild depression on wellbutrin bid. Agreed to 90 days with 1 refill with follow up in 6 months at physical.

## 2013-12-23 ENCOUNTER — Telehealth: Payer: Self-pay | Admitting: Family Medicine

## 2013-12-23 NOTE — Telephone Encounter (Signed)
Pt states last year dr Maudie Mercury mentioned a special type of flu shot for pt/ Pt would like to known if you remember what that was.

## 2013-12-24 NOTE — Telephone Encounter (Signed)
I left a message at the pts cell number to return my call. 

## 2013-12-24 NOTE — Telephone Encounter (Signed)
I am not sure what she is referring to. The high dose is for 63 years of age.

## 2013-12-24 NOTE — Telephone Encounter (Signed)
I called the pt and informed her of this and she stated she is not sure but thought Dr Maudie Mercury mentioned a combo shot and stated she will get a regular flu shot just like last year.

## 2014-04-06 ENCOUNTER — Other Ambulatory Visit: Payer: Self-pay | Admitting: Family Medicine

## 2014-04-08 ENCOUNTER — Other Ambulatory Visit: Payer: Self-pay | Admitting: Family Medicine

## 2014-04-08 DIAGNOSIS — Z1231 Encounter for screening mammogram for malignant neoplasm of breast: Secondary | ICD-10-CM

## 2014-04-10 ENCOUNTER — Encounter: Payer: Self-pay | Admitting: Family Medicine

## 2014-04-14 ENCOUNTER — Other Ambulatory Visit: Payer: Self-pay | Admitting: *Deleted

## 2014-04-14 DIAGNOSIS — Z0000 Encounter for general adult medical examination without abnormal findings: Secondary | ICD-10-CM

## 2014-04-15 ENCOUNTER — Other Ambulatory Visit: Payer: Self-pay | Admitting: Family Medicine

## 2014-04-15 DIAGNOSIS — Z1231 Encounter for screening mammogram for malignant neoplasm of breast: Secondary | ICD-10-CM

## 2014-04-18 ENCOUNTER — Ambulatory Visit
Admission: RE | Admit: 2014-04-18 | Discharge: 2014-04-18 | Disposition: A | Discharge status: Home / Self Care | Payer: BLUE CROSS/BLUE SHIELD | Source: Ambulatory Visit | Attending: Family Medicine | Admitting: Family Medicine

## 2014-04-18 DIAGNOSIS — Z1231 Encounter for screening mammogram for malignant neoplasm of breast: Secondary | ICD-10-CM

## 2014-04-23 ENCOUNTER — Other Ambulatory Visit: Payer: Self-pay | Admitting: Family Medicine

## 2014-04-23 ENCOUNTER — Other Ambulatory Visit: Payer: Self-pay

## 2014-04-23 DIAGNOSIS — R928 Other abnormal and inconclusive findings on diagnostic imaging of breast: Secondary | ICD-10-CM

## 2014-04-24 ENCOUNTER — Ambulatory Visit
Admission: RE | Admit: 2014-04-24 | Discharge: 2014-04-24 | Disposition: A | Discharge status: Home / Self Care | Payer: BLUE CROSS/BLUE SHIELD | Source: Ambulatory Visit | Attending: Family Medicine | Admitting: Family Medicine

## 2014-04-24 DIAGNOSIS — R928 Other abnormal and inconclusive findings on diagnostic imaging of breast: Secondary | ICD-10-CM

## 2014-05-20 ENCOUNTER — Other Ambulatory Visit (INDEPENDENT_AMBULATORY_CARE_PROVIDER_SITE_OTHER): Payer: BLUE CROSS/BLUE SHIELD

## 2014-05-20 DIAGNOSIS — Z0000 Encounter for general adult medical examination without abnormal findings: Secondary | ICD-10-CM

## 2014-05-20 LAB — LIPID PANEL
CHOLESTEROL: 206 mg/dL — AB (ref 0–200)
HDL: 72 mg/dL (ref >39.00)
LDL CALC: 115 mg/dL — AB (ref 0–99)
NONHDL: 134
Total CHOL/HDL Ratio: 3
Triglycerides: 96 mg/dL (ref 0.0–149.0)
VLDL: 19.2 mg/dL (ref 0.0–40.0)

## 2014-05-20 LAB — BASIC METABOLIC PANEL
BUN: 17 mg/dL (ref 6–23)
CALCIUM: 9.3 mg/dL (ref 8.4–10.5)
CO2: 31 meq/L (ref 19–32)
CREATININE: 0.9 mg/dL (ref 0.40–1.20)
Chloride: 105 mEq/L (ref 96–112)
GFR: 67.53 mL/min (ref >60.00)
GLUCOSE: 78 mg/dL (ref 70–99)
Potassium: 4.2 mEq/L (ref 3.5–5.1)
Sodium: 141 mEq/L (ref 135–145)

## 2014-05-27 ENCOUNTER — Encounter: Payer: Self-pay | Admitting: Family Medicine

## 2014-05-27 ENCOUNTER — Ambulatory Visit (INDEPENDENT_AMBULATORY_CARE_PROVIDER_SITE_OTHER): Payer: BLUE CROSS/BLUE SHIELD | Admitting: Family Medicine

## 2014-05-27 VITALS — BP 120/78 | HR 62 | Temp 97.7°F | Ht 65.0 in | Wt 142.1 lb

## 2014-05-27 DIAGNOSIS — R232 Flushing: Secondary | ICD-10-CM

## 2014-05-27 DIAGNOSIS — R21 Rash and other nonspecific skin eruption: Secondary | ICD-10-CM

## 2014-05-27 DIAGNOSIS — N951 Menopausal and female climacteric states: Secondary | ICD-10-CM

## 2014-05-27 DIAGNOSIS — M542 Cervicalgia: Secondary | ICD-10-CM

## 2014-05-27 DIAGNOSIS — K219 Gastro-esophageal reflux disease without esophagitis: Secondary | ICD-10-CM

## 2014-05-27 DIAGNOSIS — G43009 Migraine without aura, not intractable, without status migrainosus: Secondary | ICD-10-CM

## 2014-05-27 DIAGNOSIS — Z0000 Encounter for general adult medical examination without abnormal findings: Secondary | ICD-10-CM

## 2014-05-27 DIAGNOSIS — R1310 Dysphagia, unspecified: Secondary | ICD-10-CM

## 2014-05-27 MED ORDER — SUMATRIPTAN SUCCINATE 100 MG PO TABS: 100.0000 mg | ORAL_TABLET | ORAL | Status: DC | PRN

## 2014-05-27 MED ORDER — PANTOPRAZOLE SODIUM 40 MG PO TBEC: 40.0000 mg | DELAYED_RELEASE_TABLET | Freq: Every day | ORAL | Status: DC

## 2014-05-27 MED ORDER — BUPROPION HCL ER (SR) 150 MG PO TB12: 150.0000 mg | ORAL_TABLET | Freq: Two times a day (BID) | ORAL | Status: DC

## 2014-05-27 MED ORDER — TRETINOIN (EMOLLIENT) 0.05 % EX CREA: TOPICAL_CREAM | CUTANEOUS | Status: DC

## 2014-05-27 NOTE — Patient Instructions (Addendum)
We recommend the following healthy lifestyle measures: - eat a healthy diet consisting of lots of vegetables, fruits, beans, nuts, seeds, healthy meats such as white chicken and fish and whole grains.  - avoid fried foods, fast food, processed foods, sodas, red meet and other fattening foods.  - get a least 150 minutes of aerobic exercise per week.   Schedule your colonoscopy  Breast Cancer Risk Assessment: [email or web address]  Genetic Counseling Screen: [email or web address]  Let use know if you want to see the PMR specialist about your neck  Please let me know if you want to see the gastroenterologist about your swallowing  Please stop the hormones and let me know how you do  Follow up in 6 months  Please let us know if you want to do your bone density

## 2014-05-27 NOTE — Progress Notes (Signed)
HPI:  Here for CPE:  -Concerns and/or follow up today: none  GERD/Dysphagia: -rare episodes of food feeling like it gets stuck in lower esophagus -uses PPI sometimes for chronic GERD -does not want to see GI -denies: choking, weight loss  Neck Pain/shoulder: -chronic, told DDD, pinched nerve by prior PCP -she wants to consider seeing specialist -tight R trap muscle,  radiation to R arm if sleeps a certain way -no weakness, occ tingling R arm  HRT: -s/p hysterectomy -saw gyn in the past but wants me to prescribe -reports on HRT for general health benefits, bone health -does have hx hot flashes -reports takes one half patch  -Diet: variety of foods, balance and well rounded  -Exercise: regular exercise  -Taking folic acid, vitamin D or calcium: takes vit D3 daily  -Diabetes and Dyslipidemia Screening: done  -Hx of HTN: no  -Vaccines: shingles vaccine   -pap history: 04/2012, s/p total hysterectomy for fibroids, used to see Dr. Ubaldo Glassing, on 1/2 dose of 0.075mg  patch of minivelle  for hot flashes  -FDLMP: n/a  -sexual activity: yes, female partner, no new partners  -wants STI testing: no  -FH breast, colon or ovarian ca: see FH Last mammogram: last month Last colon cancer screening: 2005; reports she needs to scheduled  Breast Ca Risk Assessment: -[email or web address]  -screening information discussed and provided  Genetic Counseling Screen: Http://www.breastcancergenescreen.org -screening information discussed and provided  Osteoporosis: no parental fx, hx mother with osteoporosis and fx   -Alcohol, Tobacco, drug use: see social history  Review of Systems - no fevers, unintentional weight loss, vision loss, hearing loss, chest pain, sob, hemoptysis, melena, hematochezia, hematuria, genital discharge, changing or concerning skin lesions, bleeding, bruising, loc, thoughts of self harm or SI  Past Medical History  Diagnosis Date  . COMMON MIGRAINE  09/20/2006  . DEPRESSION 06/11/2009  . GERD 06/11/2009  . Mood disorder     Past Surgical History  Procedure Laterality Date  . Cholecystectomy    . Abdominal hysterectomy      Family History  Problem Relation Age of Onset  . Dementia Mother   . Osteoporosis Mother   . Breast cancer Sister   . Heart attack Brother     History   Social History  . Marital Status: Married    Spouse Name: N/A  . Number of Children: N/A  . Years of Education: N/A   Social History Main Topics  . Smoking status: Former Smoker    Types: Cigarettes  . Smokeless tobacco: Never Used  . Alcohol Use: Yes     Comment: occ  . Drug Use: No  . Sexual Activity: Not on file   Other Topics Concern  . None   Social History Narrative     Current outpatient prescriptions:  .  aspirin 81 MG tablet, Take 81 mg by mouth daily.  , Disp: , Rfl:  .  buPROPion (WELLBUTRIN SR) 150 MG 12 hr tablet, Take 1 tablet (150 mg total) by mouth 2 (two) times daily., Disp: 60 tablet, Rfl: 5 .  Calcium Carbonate-Vit D-Min (CALCIUM 1200 PO), Take 1 tablet by mouth daily.  , Disp: , Rfl:  .  Cholecalciferol (VITAMIN D3) 1000 UNITS capsule, Take 1,000 Units by mouth daily.  , Disp: , Rfl:  .  diphenhydramine-acetaminophen (TYLENOL PM) 25-500 MG TABS, Take 1 tablet by mouth at bedtime as needed., Disp: , Rfl:  .  fish oil-omega-3 fatty acids 1000 MG capsule, Take 1 g by mouth daily., Disp: , Rfl:  .  MINIVELLE 0.075 MG/24HR, APPLY ONE PATCH TO SKIN TWICE A WEEK, Disp: 8 patch, Rfl: 2 .  naproxen (NAPROSYN) 500 MG tablet, Take 1 tablet (500 mg total) by mouth 2 (two) times daily with a meal., Disp: 30 tablet, Rfl: 0 .  niacin 250 MG tablet, Take 250 mg by mouth daily with breakfast.  , Disp: , Rfl:  .  pantoprazole (PROTONIX) 40 MG tablet, Take 1 tablet (40 mg total) by mouth daily., Disp: 30 tablet, Rfl: 5 .  SUMAtriptan (IMITREX) 100 MG tablet, Take 1 tablet (100 mg total) by mouth every 2 (two) hours as needed for migraine or  headache. May repeat in 2 hours if headache persists or recurs., Disp: 10 tablet, Rfl: 2 .  Tretinoin, Facial Wrinkles, (TRETINOIN, EMOLLIENT,) 0.05 % CREA, Apply a small amount to affected area nightly, Disp: 20 g, Rfl: 1 .  vitamin E 400 UNIT capsule, Take 400 Units by mouth daily.  , Disp: , Rfl:   EXAM:  Filed Vitals:   05/27/14 0815  BP: 120/78  Pulse: 62  Temp: 97.7 F (36.5 C)    GENERAL: vitals reviewed and listed below, alert, oriented, appears well hydrated and in no acute distress  HEENT: head atraumatic, PERRLA, normal appearance of eyes, ears, nose and mouth. moist mucus membranes.  NECK: supple, no masses or lymphadenopathy  LUNGS: clear to auscultation bilaterally, no rales, rhonchi or wheeze  CV: HRRR, no peripheral edema or cyanosis, normal pedal pulses  BREAST: declined  ABDOMEN: bowel sounds normal, soft, non tender to palpation, no masses, no rebound or guarding  GU: declined  RECTAL: refused  SKIN: no rash or abnormal lesions  MS: normal gait, moves all extremities normally  NEURO: CN II-XII grossly intact, normal muscle strength and sensation to light touch on extremities  PSYCH: normal affect, pleasant and cooperative  ASSESSMENT AND PLAN:  Discussed the following assessment and plan:  Visit for preventive health examination -discussed and advised all [place] preventive services health task force level A and B recommendations for age, sex and risks.  -Advised at least 150 minutes of exercise per week and a healthy diet low in saturated fats and sweets and consisting of fresh fruits and vegetables, lean meats such as fish and white chicken and whole grains.  -labs were done already, studies and vaccines per orders this encounter Gastroesophageal reflux disease without esophagitis - Plan: pantoprazole (PROTONIX) 40 MG tablet  Migraine without aura and without status migrainosus, not intractable - Plan: SUMAtriptan (IMITREX) 100 MG tablet  Rash and  nonspecific skin eruption - Plan: Tretinoin, Facial Wrinkles, (TRETINOIN, EMOLLIENT,) 0.05 % CREA  Gastroesophageal reflux disease, esophagitis presence not specified Dysphagia -we discussed possible serious and likely etiologies, workup and treatment, treatment risks and return precautions - advised GI eval with likely EGD -after this discussion, Daniel opted to think about this and consider referral, advised to call and happy to place -of course, we advised Lindsay [name]  to return or notify a doctor immediately if symptoms worsen or persist or new concerns arise.  Hot flashes: -on chronic HRT and her reasons for taking are not actual indications, hx of hot flashes -discussed indications, risks -advised trial off and advised would only use if sig hot flashes and would do trial off intermittently  Neck pain -mentioned at end of appointment that had already run over > 15 minutes -chronic -advised of diagnostic and treatment options, offered eval with PMR, she will consider and let us know and try conservative options for now  No orders of the defined types were placed in this encounter.    Patient advised to return to clinic immediately if symptoms worsen or persist or new concerns.  Patient Instructions  We recommend the following healthy lifestyle measures: - eat a healthy diet consisting of lots of vegetables, fruits, beans, nuts, seeds, healthy meats such as white chicken and fish and whole grains.  - avoid fried foods, fast food, processed foods, sodas, red meet and other fattening foods.  - get a least 150 minutes of aerobic exercise per week.   Schedule your colonoscopy  Breast Cancer Risk Assessment: -[email or web address].it  Genetic Counseling Screen: www.breastcancergenescreen.org  Let use know if you want to see the PMR specialist about your back pain  Please let me know if you want to see the gastroenterologist about your swallowing  Please stop the hormones and  let me know how you do  Follow up in 6 months  Please let us know if you want to do your bone density        Return in about 1 year (around 05/28/2015) for [place], [place]

## 2014-05-27 NOTE — Progress Notes (Signed)
Pre visit review using our clinic review tool, if applicable. No additional management support is needed unless otherwise documented below in the visit note. 

## 2014-06-06 ENCOUNTER — Encounter: Payer: Self-pay | Admitting: Gastroenterology

## 2014-06-16 ENCOUNTER — Encounter: Payer: Self-pay | Admitting: Gastroenterology

## 2014-06-18 ENCOUNTER — Other Ambulatory Visit: Payer: Self-pay | Admitting: Family Medicine

## 2014-07-31 ENCOUNTER — Ambulatory Visit (AMBULATORY_SURGERY_CENTER): Payer: Self-pay | Admitting: *Deleted

## 2014-07-31 VITALS — Ht 66.0 in | Wt 141.4 lb

## 2014-07-31 DIAGNOSIS — Z1211 Encounter for screening for malignant neoplasm of colon: Secondary | ICD-10-CM

## 2014-07-31 MED ORDER — NA SULFATE-K SULFATE-MG SULF 17.5-3.13-1.6 GM/177ML PO SOLN: 1.0000 | Freq: Once | ORAL | Status: DC

## 2014-07-31 NOTE — Progress Notes (Signed)
Denies allergies to eggs or soy products. Denies complications with sedation or anesthesia. Denies O2 use. Denies use of diet or weight loss medications.  Emmi instructions given for colonoscopy.  

## 2014-08-14 ENCOUNTER — Encounter: Payer: Self-pay | Admitting: Gastroenterology

## 2014-08-14 ENCOUNTER — Ambulatory Visit (AMBULATORY_SURGERY_CENTER): Payer: BLUE CROSS/BLUE SHIELD | Admitting: Gastroenterology

## 2014-08-14 VITALS — BP 139/84 | HR 60 | Temp 97.5°F | Resp 20 | Ht 66.0 in | Wt 141.0 lb

## 2014-08-14 DIAGNOSIS — Z1211 Encounter for screening for malignant neoplasm of colon: Secondary | ICD-10-CM

## 2014-08-14 MED ORDER — SODIUM CHLORIDE 0.9 % IV SOLN: 500.0000 mL | INTRAVENOUS | Status: DC

## 2014-08-14 NOTE — Op Note (Signed)
[facility]  [address] [place], [id number]   COLONOSCOPY PROCEDURE REPORT  PATIENT: Lindsay [name], Lindsay [name]  MR#: [id number] BIRTHDATE: 12-02-1950 , 63  yrs. old GENDER: female ENDOSCOPIST: Inda Castle, MD REFERRED KM:MNOTRR Kim, D.O. PROCEDURE DATE:  08/14/2014 PROCEDURE:   Colonoscopy, screening First Screening Colonoscopy - Avg.  risk and is 50 yrs.  old or older - No.  Prior Negative Screening - Now for repeat screening. 10 or more years since last screening  History of Adenoma - Now for follow-up colonoscopy & has been > or = to 3 yrs.  N/A  Polyps removed today = NO ASA CLASS:   Class II INDICATIONS:Colorectal Neoplasm Risk Assessment for this procedure is average risk. MEDICATIONS: Monitored anesthesia care and Propofol 200 mg IV  DESCRIPTION OF PROCEDURE:   After the risks benefits and alternatives of the procedure were thoroughly explained, informed consent was obtained.  The digital rectal exam revealed no abnormalities of the rectum.   The LB 1528  endoscope was introduced through the anus and advanced to the cecum, which was identified by both the appendix and ileocecal valve. No adverse events experienced.   The quality of the prep was (Suprep was used) good.  The instrument was then slowly withdrawn as the colon was fully examined.      COLON FINDINGS: A normal appearing cecum, ileocecal valve, and appendiceal orifice were identified.  The ascending, transverse, descending, sigmoid colon, and rectum appeared unremarkable. Retroflexed views revealed no abnormalities. The time to cecum = 3.9 Withdrawal time = 6.4   The scope was withdrawn and the procedure completed. COMPLICATIONS: There were no immediate complications.  ENDOSCOPIC IMPRESSION: Normal colonoscopy  RECOMMENDATIONS: Continue current colorectal screening recommendations for "routine risk" patients with a repeat colonoscopy in 10 years.  eSigned:  Inda Castle, MD 08/14/2014  9:27 AM   cc:

## 2014-08-14 NOTE — Patient Instructions (Signed)
YOU HAD AN ENDOSCOPIC PROCEDURE TODAY AT [facility]:   Refer to the procedure report that was given to you for any specific questions about what was found during the examination.  If the procedure report does not answer your questions, please call your gastroenterologist to clarify.  If you requested that your care partner not be given the details of your procedure findings, then the procedure report has been included in a sealed envelope for you to review at your convenience later.  YOU SHOULD EXPECT: Some feelings of bloating in the abdomen. Passage of more gas than usual.  Walking can help get rid of the air that was put into your GI tract during the procedure and reduce the bloating. If you had a lower endoscopy (such as a colonoscopy or flexible sigmoidoscopy) you may notice spotting of blood in your stool or on the toilet paper. If you underwent a bowel prep for your procedure, you may not have a normal bowel movement for a few days.  Please Note:  You might notice some irritation and congestion in your nose or some drainage.  This is from the oxygen used during your procedure.  There is no need for concern and it should clear up in a day or so.  SYMPTOMS TO REPORT IMMEDIATELY:   Following lower endoscopy (colonoscopy or flexible sigmoidoscopy):  Excessive amounts of blood in the stool  Significant tenderness or worsening of abdominal pains  Swelling of the abdomen that is new, acute  Fever of 100F or higher   For urgent or emergent issues, a gastroenterologist can be reached at any hour by calling [phone].   DIET: Your first meal following the procedure should be a small meal and then it is ok to progress to your normal diet. Heavy or fried foods are harder to digest and may make you feel nauseous or bloated.  Likewise, meals heavy in dairy and vegetables can increase bloating.  Drink plenty of fluids but you should avoid alcoholic beverages for 24  hours.  ACTIVITY:  You should plan to take it easy for the rest of today and you should NOT DRIVE or use heavy machinery until tomorrow (because of the sedation medicines used during the test).    FOLLOW UP: Our staff will call the number listed on your records the next business day following your procedure to check on you and address any questions or concerns that you may have regarding the information given to you following your procedure. If we do not reach you, we will leave a message.  However, if you are feeling well and you are not experiencing any problems, there is no need to return our call.  We will assume that you have returned to your regular daily activities without incident.  If any biopsies were taken you will be contacted by phone or by letter within the next 1-3 weeks.  Please call us at [phone] if you have not heard about the biopsies in 3 weeks.    SIGNATURES/CONFIDENTIALITY: You and/or your care partner have signed paperwork which will be entered into your electronic medical record.  These signatures attest to the fact that that the information above on your After Visit Summary has been reviewed and is understood.  Full responsibility of the confidentiality of this discharge information lies with you and/or your care-partner.  Next colonoscopy in 10 years.

## 2014-08-14 NOTE — Progress Notes (Signed)
To recovery, report to Myers, RN, VSS. 

## 2014-08-15 ENCOUNTER — Telehealth: Payer: Self-pay | Admitting: *Deleted

## 2014-08-15 NOTE — Telephone Encounter (Signed)
  Follow up Call-  Call back number 08/14/2014  Post procedure Call Back phone  # [phone] hm  Permission to leave phone message Yes     Patient questions:  Do you have a fever, pain , or abdominal swelling? No. Pain Score  0 *  Have you tolerated food without any problems? Yes.    Have you been able to return to your normal activities? Yes.    Do you have any questions about your discharge instructions: Diet   No. Medications  No. Follow up visit  No.  Do you have questions or concerns about your Care? No.  Actions: * If pain score is 4 or above: No action needed, pain <4.

## 2014-11-03 ENCOUNTER — Encounter: Payer: Self-pay | Admitting: Family Medicine

## 2014-11-03 ENCOUNTER — Ambulatory Visit (INDEPENDENT_AMBULATORY_CARE_PROVIDER_SITE_OTHER): Payer: BLUE CROSS/BLUE SHIELD | Admitting: Family Medicine

## 2014-11-03 ENCOUNTER — Telehealth: Payer: Self-pay | Admitting: Family Medicine

## 2014-11-03 VITALS — BP 126/76 | HR 82 | Temp 98.7°F | Ht 66.0 in | Wt 142.7 lb

## 2014-11-03 DIAGNOSIS — M5412 Radiculopathy, cervical region: Secondary | ICD-10-CM

## 2014-11-03 DIAGNOSIS — M5030 Other cervical disc degeneration, unspecified cervical region: Secondary | ICD-10-CM | POA: Diagnosis not present

## 2014-11-03 MED ORDER — PREDNISONE 10 MG PO TABS: 10.0000 mg | ORAL_TABLET | Freq: Every day | ORAL | Status: DC

## 2014-11-03 NOTE — Telephone Encounter (Signed)
Pt would like late appt follow up on neck pain. Can I use sda?

## 2014-11-03 NOTE — Progress Notes (Addendum)
HPI:  Acute visit for:  Neck Pain: -chronic, told DDD, pinched nerve by prior PCP -flare in this several times per year; this started a few days ago -she wanted to consider seeing specialist at the last visit, but got better so she never called -tight R trap muscle, radiation to R arm if sleeps a certain way -no weakness, occ tingling R arm -denies fevers, chills, malaise  -she is really against doing any type of surgery  ROS: See pertinent positives and negatives per HPI.  Past Medical History  Diagnosis Date  . COMMON MIGRAINE 09/20/2006  . DEPRESSION 06/11/2009  . GERD 06/11/2009  . Mood disorder     Past Surgical History  Procedure Laterality Date  . Cholecystectomy    . Abdominal hysterectomy    . Ovarian cyst removal    . Tonsillectomy    . Repair extensor tendon hand      Family History  Problem Relation Age of Onset  . Dementia Mother   . Osteoporosis Mother   . Breast cancer Sister   . Heart attack Brother   . Colon cancer Neg Hx   . Esophageal cancer Neg Hx   . Rectal cancer Neg Hx   . Stomach cancer Neg Hx     History   Social History  . Marital Status: Married    Spouse Name: N/A  . Number of Children: N/A  . Years of Education: N/A   Social History Main Topics  . Smoking status: Former Smoker    Types: Cigarettes    Quit date: 04/04/2004  . Smokeless tobacco: Never Used  . Alcohol Use: 0.0 oz/week    0 Standard drinks or equivalent per week     Comment: occ  . Drug Use: No  . Sexual Activity: Not on file   Other Topics Concern  . None   Social History Narrative     Current outpatient prescriptions:  .  aspirin 81 MG tablet, Take 81 mg by mouth daily.  , Disp: , Rfl:  .  buPROPion (WELLBUTRIN SR) 150 MG 12 hr tablet, Take 1 tablet (150 mg total) by mouth 2 (two) times daily., Disp: 60 tablet, Rfl: 5 .  Calcium Carbonate-Vit D-Min (CALCIUM 1200 PO), Take 1 tablet by mouth daily.  , Disp: , Rfl:  .  Cholecalciferol (VITAMIN D3) 1000  UNITS capsule, Take 1,000 Units by mouth daily.  , Disp: , Rfl:  .  diphenhydramine-acetaminophen (TYLENOL PM) 25-500 MG TABS, Take 1 tablet by mouth at bedtime as needed., Disp: , Rfl:  .  Docusate Sodium (COLACE PO), Take by mouth., Disp: , Rfl:  .  fish oil-omega-3 fatty acids 1000 MG capsule, Take 1 g by mouth daily., Disp: , Rfl:  .  MAGNESIUM PO, Take by mouth., Disp: , Rfl:  .  naproxen (NAPROSYN) 500 MG tablet, Take 1 tablet (500 mg total) by mouth 2 (two) times daily with a meal., Disp: 30 tablet, Rfl: 0 .  niacin 250 MG tablet, Take 250 mg by mouth daily with breakfast.  , Disp: , Rfl:  .  pantoprazole (PROTONIX) 40 MG tablet, Take 1 tablet (40 mg total) by mouth daily., Disp: 30 tablet, Rfl: 5 .  SUMAtriptan (IMITREX) 100 MG tablet, Take 1 tablet (100 mg total) by mouth every 2 (two) hours as needed for migraine or headache. May repeat in 2 hours if headache persists or recurs., Disp: 10 tablet, Rfl: 2 .  Tretinoin, Facial Wrinkles, (TRETINOIN, EMOLLIENT,) 0.05 % CREA, Apply a  small amount to affected area nightly, Disp: 20 g, Rfl: 1 .  vitamin E 400 UNIT capsule, Take 400 Units by mouth daily.  , Disp: , Rfl:  .  predniSONE (DELTASONE) 10 MG tablet, Take 1 tablet (10 mg total) by mouth daily with breakfast. 40mg  (4 tabs) daily for 3 days, then 20mg  (2 tabs) daily for 3 days, then 10mg  (1 tab) daily for 3 days, Disp: 21 tablet, Rfl: 0  EXAM:  Filed Vitals:   11/03/14 1549  BP: 126/76  Pulse: 82  Temp: 98.7 F (37.1 C)    Body mass index is 23.04 kg/(m^2).  GENERAL: vitals reviewed and listed above, alert, oriented, appears well hydrated and in no acute distress  HEENT: atraumatic, conjunttiva clear, no obvious abnormalities on inspection of external nose and ears  NECK: no obvious masses on inspection - see below  MS/NERUO: moves all extremities without noticeable abnormality -loss of normal curvature of neck -TTP in R trapezius -no bony TTP -normal ROM head, neck and  arms -neg spurling -normal DTRs, sensation to light touch, strength and distal pulses in bilateral UEs  PSYCH: pleasant and cooperative, no obvious depression or anxiety  ASSESSMENT AND PLAN:  Discussed the following assessment and plan:  Cervical radiculopathy  DDD (degenerative disc disease), cervical  -no weakness, no neurodeficits on exam -sig DDD on review of prior films -discussed options, she is reluctant to see specialist due to cost issues, opted for HEP and steroid course -she may consider seeing sports med as her husband is seeing Dr. Tamala Julian and they are quite happy -she plans to call me in a few weeks to let me know how she is doing -Patient advised to return or notify a doctor immediately if symptoms worsen or persist or new concerns arise.  Patient Instructions  BEFORE YOU LEAVE: -neck exercises -follow up in 3-4 weeks  Do the exercises 3-4 days per week  Prednisone per instructions  Call if you decide to see Dr. Dianna Limbo or Dr. Kyung Rudd, [facility] R.

## 2014-11-03 NOTE — Progress Notes (Signed)
Pre visit review using our clinic review tool, if applicable. No additional management support is needed unless otherwise documented below in the visit note. 

## 2014-11-03 NOTE — Telephone Encounter (Signed)
Pt has been sch

## 2014-11-03 NOTE — Patient Instructions (Signed)
BEFORE YOU LEAVE: -neck exercises -follow up in 3-4 weeks  Do the exercises 3-4 days per week  Prednisone per instructions  Call if you decide to see Dr. Dianna Limbo or Dr. Nelva Bush

## 2014-11-03 NOTE — Telephone Encounter (Signed)
Today? Sure! That is what those slots are for. Thanks.

## 2014-11-04 NOTE — Addendum Note (Signed)
Addended by: Lucretia Kern on: 11/04/2014 05:37 AM   Modules accepted: Level of Service

## 2014-11-18 ENCOUNTER — Encounter: Payer: Self-pay | Admitting: Family Medicine

## 2014-11-24 ENCOUNTER — Telehealth: Payer: Self-pay | Admitting: Family Medicine

## 2014-11-24 DIAGNOSIS — M542 Cervicalgia: Secondary | ICD-10-CM

## 2014-11-24 NOTE — Telephone Encounter (Signed)
Left a message for the pt to return my call.  

## 2014-11-24 NOTE — Telephone Encounter (Signed)
Patient called back and stated she needs a referral to see Dr Tamala Julian as no one has called her about an appt yet?

## 2014-11-24 NOTE — Telephone Encounter (Signed)
I really think he would need to evaluate her to know if he can help her or not. He will be able to see the chart at her visit. Thank you.

## 2014-11-24 NOTE — Addendum Note (Signed)
Addended by: Lucretia Kern on: 11/24/2014 01:52 PM   Modules accepted: Orders

## 2014-11-24 NOTE — Telephone Encounter (Signed)
pt states she spoke w/ dr  Maudie Mercury about referral to Dr [name] at [facility] for her neck pain.  Would like him to look at xray before her appt to ensure he can see/help her.

## 2014-11-28 ENCOUNTER — Ambulatory Visit: Payer: BLUE CROSS/BLUE SHIELD | Admitting: Family Medicine

## 2014-12-03 ENCOUNTER — Encounter: Payer: Self-pay | Admitting: Family Medicine

## 2014-12-03 ENCOUNTER — Ambulatory Visit (INDEPENDENT_AMBULATORY_CARE_PROVIDER_SITE_OTHER): Payer: BLUE CROSS/BLUE SHIELD | Admitting: Family Medicine

## 2014-12-03 VITALS — BP 122/82 | HR 75 | Ht 66.0 in | Wt 143.0 lb

## 2014-12-03 DIAGNOSIS — M9908 Segmental and somatic dysfunction of rib cage: Secondary | ICD-10-CM | POA: Diagnosis not present

## 2014-12-03 DIAGNOSIS — M5030 Other cervical disc degeneration, unspecified cervical region: Secondary | ICD-10-CM | POA: Diagnosis not present

## 2014-12-03 DIAGNOSIS — M9901 Segmental and somatic dysfunction of cervical region: Secondary | ICD-10-CM | POA: Diagnosis not present

## 2014-12-03 DIAGNOSIS — M9902 Segmental and somatic dysfunction of thoracic region: Secondary | ICD-10-CM | POA: Diagnosis not present

## 2014-12-03 DIAGNOSIS — M999 Biomechanical lesion, unspecified: Secondary | ICD-10-CM | POA: Insufficient documentation

## 2014-12-03 NOTE — Assessment & Plan Note (Signed)
Decision today to treat with OMT was based on Physical Exam  After verbal consent patient was treated with ME, FPR techniques in cervical, thoracic and rib areas  Patient tolerated the procedure well with improvement in symptoms  Patient given exercises, stretches and lifestyle modifications  See medications in patient instructions if given  Patient will follow up in 3 weeks  

## 2014-12-03 NOTE — Patient Instructions (Signed)
Good to see you.  Ice 20 minutes 2 times daily. Usually after activity and before bed. Exercises 3 times a week.  Turmeric 500mg  twice daily Tylenol 325 mg 3 times a day Tart cherry extract Gabapentin 100mg  at night for next week then 200mg  thereafter See me again in 3 weeks.  Standing:  Secure a rubber exercise band/tubing so that it is at the height of your shoulders when you are either standing or sitting on a firm arm-less chair.  Grasp an end of the band/tubing in each hand and have your palms face each other. Straighten your elbows and lift your hands straight in front of you at shoulder height. Step back away from the secured end of band/tubing until it becomes tense.  Squeeze your shoulder blades together. Keeping your elbows locked and your hands at shoulder-height, bring your hands out to your side.  Hold __________ seconds. Slowly ease the tension on the band/tubing as you reverse the directions and return to the starting position. Repeat __________ times. Complete this exercise __________ times per day. STRENGTH - Scapular Retractors  Secure a rubber exercise band/tubing so that it is at the height of your shoulders when you are either standing or sitting on a firm arm-less chair.  With a palm-down grip, grasp an end of the band/tubing in each hand. Straighten your elbows and lift your hands straight in front of you at shoulder height. Step back away from the secured end of band/tubing until it becomes tense.  Squeezing your shoulder blades together, draw your elbows back as you bend them. Keep your upper arm lifted away from your body throughout the exercise.  Hold __________ seconds. Slowly ease the tension on the band/tubing as you reverse the directions and return to the starting position. Repeat __________ times. Complete this exercise __________ times per day. STRENGTH - Shoulder Extensors   Secure a rubber exercise band/tubing so that it is at the height of your  shoulders when you are either standing or sitting on a firm arm-less chair.  With a thumbs-up grip, grasp an end of the band/tubing in each hand. Straighten your elbows and lift your hands straight in front of you at shoulder height. Step back away from the secured end of band/tubing until it becomes tense.  Squeezing your shoulder blades together, pull your hands down to the sides of your thighs. Do not allow your hands to go behind you.  Hold for __________ seconds. Slowly ease the tension on the band/tubing as you reverse the directions and return to the starting position. Repeat __________ times. Complete this exercise __________ times per day.  STRENGTH - Scapular Retractors and External Rotators  Secure a rubber exercise band/tubing so that it is at the height of your shoulders when you are either standing or sitting on a firm arm-less chair.  With a palm-down grip, grasp an end of the band/tubing in each hand. Bend your elbows 90 degrees and lift your elbows to shoulder height at your sides. Step back away from the secured end of band/tubing until it becomes tense.  Squeezing your shoulder blades together, rotate your shoulder so that your upper arm and elbow remain stationary, but your fists travel upward to head-height.  Hold __________ for seconds. Slowly ease the tension on the band/tubing as you reverse the directions and return to the starting position. Repeat __________ times. Complete this exercise __________ times per day.  STRENGTH - Scapular Retractors and External Rotators, Rowing  Secure a rubber exercise band/tubing so  that it is at the height of your shoulders when you are either standing or sitting on a firm arm-less chair.  With a palm-down grip, grasp an end of the band/tubing in each hand. Straighten your elbows and lift your hands straight in front of you at shoulder height. Step back away from the secured end of band/tubing until it becomes tense.  Step 1: Squeeze  your shoulder blades together. Bending your elbows, draw your hands to your chest as if you are rowing a boat. At the end of this motion, your hands and elbow should be at shoulder-height and your elbows should be out to your sides.  Step 2: Rotate your shoulder to raise your hands above your head. Your forearms should be vertical and your upper-arms should be horizontal.  Hold for __________ seconds. Slowly ease the tension on the band/tubing as you reverse the directions and return to the starting position. Repeat __________ times. Complete this exercise __________ times per day.  STRENGTH - Scapular Retractors and Elevators  Secure a rubber exercise band/tubing so that it is at the height of your shoulders when you are either standing or sitting on a firm arm-less chair.  With a thumbs-up grip, grasp an end of the band/tubing in each hand. Step back away from the secured end of band/tubing until it becomes tense.  Squeezing your shoulder blades together, straighten your elbows and lift your hands straight over your head.  Hold for __________ seconds. Slowly ease the tension on the band/tubing as you reverse the directions and return to the starting position. Repeat __________ times. Complete this exercise __________ times per day.  Document Released: 03/21/2005 Document Revised: 06/13/2011 Document Reviewed: 07/03/2008 [facility] Patient Information 2015 [place], [place]. This information is not intended to replace advice given to you by your health care provider. Make sure you discuss any questions you have with your health care provider.

## 2014-12-03 NOTE — Progress Notes (Signed)
Pre visit review using our clinic review tool, if applicable. No additional management support is needed unless otherwise documented below in the visit note. 

## 2014-12-03 NOTE — Progress Notes (Signed)
[name] [address], [place] [address] Phone: [phone] Subjective:    I'm seeing this patient by the request  of:  Lucretia Kern., DO   CC: Neck pain  [id number] KANDEE ESCALANTE is a 64 y.o. female coming in with complaint of neck pain. Patient has a history of moderate arthritis in the neck that was seen on x-rays back in 2011. This was reviewed by me. Patient states she continues to have some mild discomfort from time to time. States that there is radiation that seems to be intermittent mostly to the index and thumb. Patient denies any weakness though. Patient states is a dull aching sensation. Patient has been given prednisone forte in the past that has been helpful. Patient would like to avoid any significant prednisone if possible. Patient recently was given another pack though. Patient states it is helping but continues to have the tingling sensation. With her decreasing the dose she is noticing mild increasing and the discomfort again. Denies though any true weakness. Denies any nighttime awakening but finds it difficult to get comfortable at night. Rates the severity of 7 out of 10. No recent injuries. Past Medical History  Diagnosis Date  . COMMON MIGRAINE 09/20/2006  . DEPRESSION 06/11/2009  . GERD 06/11/2009  . Mood disorder    Past Surgical History  Procedure Laterality Date  . Cholecystectomy    . Abdominal hysterectomy    . Ovarian cyst removal    . Tonsillectomy    . Repair extensor tendon hand     Social History  Substance Use Topics  . Smoking status: Former Smoker    Types: Cigarettes    Quit date: 04/04/2004  . Smokeless tobacco: Never Used  . Alcohol Use: 0.0 oz/week    0 Standard drinks or equivalent per week     Comment: occ   No Known Allergies Family History  Problem Relation Age of Onset  . Dementia Mother   . Osteoporosis Mother   . Breast cancer Sister   . Heart attack Brother   . Colon cancer Neg Hx     . Esophageal cancer Neg Hx   . Rectal cancer Neg Hx   . Stomach cancer Neg Hx         Past medical history, social, surgical and family history all reviewed in electronic medical record.   Review of Systems: No headache, visual changes, nausea, vomiting, diarrhea, constipation, dizziness, abdominal pain, skin rash, fevers, chills, night sweats, weight loss, swollen lymph nodes, body aches, joint swelling, muscle aches, chest pain, shortness of breath, mood changes.   Objective Blood pressure 122/82, pulse 75, height 5\' 6"  (1.676 m), weight 143 lb (64.864 kg), SpO2 95 %.  General: No apparent distress alert and oriented x3 mood and affect normal, dressed appropriately.  HEENT: Pupils equal, extraocular movements intact  Respiratory: Patient's speak in full sentences and does not appear short of breath  Cardiovascular: No lower extremity edema, non tender, no erythema  Skin: Warm dry intact with no signs of infection or rash on extremities or on axial skeleton.  Abdomen: Soft nontender  Neuro: Cranial nerves II through XII are intact, neurovascularly intact in all extremities with 2+ DTRs and 2+ pulses.  Lymph: No lymphadenopathy of posterior or anterior cervical chain or axillae bilaterally.  Gait normal with good balance and coordination.  MSK:  Non tender with full range of motion and good stability and symmetric strength and tone of shoulders, elbows, wrist,  hip, knee and ankles bilaterally.  Neck: Inspection unremarkable. No palpable stepoffs. Negative Spurling's maneuver.  Pain but no radicular symptoms.  Decrease lacking the last 5 of extension Grip strength and sensation normal in bilateral hands Strength good C4 to T1 distribution No sensory change to C4 to T1 Negative Hoffman sign bilaterally Reflexes normal  Osteopathic findings C2 flexed rotated and side bent left C4 flexed rotated and side bent right T3 extended rotated and side bent right inhaled third rib T5  extended rotated and side bent left  Procedure note 97110; 15 minutes spent for Therapeutic exercises as stated in above notes.  This included exercises focusing on stretching, strengthening, with significant focus on eccentric aspects. Scapular control exercises, postural exercises and ergonomic exercises given.  Proper technique shown and discussed handout in great detail with ATC.  All questions were discussed and answered.     Impression and Recommendations:     This case required medical decision making of moderate complexity.

## 2014-12-03 NOTE — Assessment & Plan Note (Signed)
Patient does have old x-rays showing some degenerative cervical disc disease at multiple levels. These are 64 years old. We will discuss getting new x-rays in the near future if continuing have difficulty. Patient try conservative therapy. Patient given a prescription for gabapentin and we discussed over-the-counter natural supplementation. Home exercises were given and work with Product/process development scientist. We discussed postural control and ergonomics at work. Patient will try to make these changes and see me again in 3 weeks for further evaluation and treatment.

## 2014-12-04 MED ORDER — GABAPENTIN 100 MG PO CAPS: 200.0000 mg | ORAL_CAPSULE | Freq: Every day | ORAL | Status: DC

## 2014-12-18 ENCOUNTER — Other Ambulatory Visit: Payer: Self-pay | Admitting: Family Medicine

## 2014-12-24 ENCOUNTER — Encounter: Payer: Self-pay | Admitting: Family Medicine

## 2014-12-24 ENCOUNTER — Ambulatory Visit (INDEPENDENT_AMBULATORY_CARE_PROVIDER_SITE_OTHER): Payer: BLUE CROSS/BLUE SHIELD | Admitting: Family Medicine

## 2014-12-24 VITALS — BP 130/88 | HR 67 | Ht 66.0 in | Wt 142.0 lb

## 2014-12-24 DIAGNOSIS — M5030 Other cervical disc degeneration, unspecified cervical region: Secondary | ICD-10-CM | POA: Diagnosis not present

## 2014-12-24 DIAGNOSIS — M9902 Segmental and somatic dysfunction of thoracic region: Secondary | ICD-10-CM | POA: Diagnosis not present

## 2014-12-24 DIAGNOSIS — M9901 Segmental and somatic dysfunction of cervical region: Secondary | ICD-10-CM

## 2014-12-24 DIAGNOSIS — M9908 Segmental and somatic dysfunction of rib cage: Secondary | ICD-10-CM

## 2014-12-24 DIAGNOSIS — M999 Biomechanical lesion, unspecified: Secondary | ICD-10-CM

## 2014-12-24 NOTE — Assessment & Plan Note (Signed)
Decision today to treat with OMT was based on Physical Exam  After verbal consent patient was treated with ME, FPR techniques in cervical, thoracic and rib areas  Patient tolerated the procedure well with improvement in symptoms  Patient given exercises, stretches and lifestyle modifications  See medications in patient instructions if given  Patient will follow up in 3-4 weeks    

## 2014-12-24 NOTE — Progress Notes (Signed)
[name] [address], [place] [address] Phone: [phone] Subjective:    I'm seeing this patient by the request  of:  Lucretia Kern., DO   CC: Neck pain  [id number] Lindsay [name] is a 64 y.o. female coming in with complaint of neck pain. Patient has a history of moderate arthritis in the neck that was seen on x-rays back in 2011. Patient elected to try conservative therapy and was given home exercises, icing protocol, as well as postural changes. Patient was to get over-the-counter natural supplementations. Patient states She is doing better. States by 50% better. Has noticed increasing range of motion of the neck and less pain and weakness that she is noticing in the arms. Patient states that very minimal tingling sensation is remaining.Marland Kitchen Happy with the results. No side effects to any medications.    Past Medical History  Diagnosis Date  . COMMON MIGRAINE 09/20/2006  . DEPRESSION 06/11/2009  . GERD 06/11/2009  . Mood disorder    Past Surgical History  Procedure Laterality Date  . Cholecystectomy    . Abdominal hysterectomy    . Ovarian cyst removal    . Tonsillectomy    . Repair extensor tendon hand     Social History  Substance Use Topics  . Smoking status: Former Smoker    Types: Cigarettes    Quit date: 04/04/2004  . Smokeless tobacco: Never Used  . Alcohol Use: 0.0 oz/week    0 Standard drinks or equivalent per week     Comment: occ   No Known Allergies Family History  Problem Relation Age of Onset  . Dementia Mother   . Osteoporosis Mother   . Breast cancer Sister   . Heart attack Brother   . Colon cancer Neg Hx   . Esophageal cancer Neg Hx   . Rectal cancer Neg Hx   . Stomach cancer Neg Hx         Past medical history, social, surgical and family history all reviewed in electronic medical record.   Review of Systems: No headache, visual changes, nausea, vomiting, diarrhea, constipation, dizziness, abdominal  pain, skin rash, fevers, chills, night sweats, weight loss, swollen lymph nodes, body aches, joint swelling, muscle aches, chest pain, shortness of breath, mood changes.   Objective Blood pressure 130/88, pulse 67, height 5\' 6"  (1.676 m), weight 142 lb (64.411 kg), SpO2 98 %.  General: No apparent distress alert and oriented x3 mood and affect normal, dressed appropriately.  HEENT: Pupils equal, extraocular movements intact  Respiratory: Patient's speak in full sentences and does not appear short of breath  Cardiovascular: No lower extremity edema, non tender, no erythema  Skin: Warm dry intact with no signs of infection or rash on extremities or on axial skeleton.  Abdomen: Soft nontender  Neuro: Cranial nerves II through XII are intact, neurovascularly intact in all extremities with 2+ DTRs and 2+ pulses.  Lymph: No lymphadenopathy of posterior or anterior cervical chain or axillae bilaterally.  Gait normal with good balance and coordination.  MSK:  Non tender with full range of motion and good stability and symmetric strength and tone of shoulders, elbows, wrist, hip, knee and ankles bilaterally.  Neck: Inspection unremarkable. No palpable stepoffs. Negative Spurling's maneuver.  .  Still lacks last 5 of extension Grip strength and sensation normal in bilateral hands Strength good C4 to T1 distribution No sensory change to C4 to T1 Negative Hoffman sign bilaterally Reflexes normal  Osteopathic findings  C2 flexed rotated and side bent left C4 flexed rotated and side bent right T3 extended rotated and side bent right inhaled third rib T5 extended rotated and side bent left T8 extended rotated and side bent left      Impression and Recommendations:     This case required medical decision making of moderate complexity.

## 2014-12-24 NOTE — Assessment & Plan Note (Signed)
Patient is doing very well with conservative therapy. Patient is still not taking the scheduled Tylenol neck be something that could also benefit her. We discussed continuing the icing and the postural changes. Patient will come back and see me again in 3-4 weeks for further evaluation and treatment.

## 2014-12-24 NOTE — Progress Notes (Signed)
Pre visit review using our clinic review tool, if applicable. No additional management support is needed unless otherwise documented below in the visit note. 

## 2014-12-24 NOTE — Patient Instructions (Addendum)
Too easy  Only sit and eat bond bonds while husband fans you ;) Ice when you need it Continue the vitamins and the exercises  Already night and day See me again in 4-6 weeks.

## 2015-01-28 ENCOUNTER — Telehealth: Payer: Self-pay | Admitting: Family Medicine

## 2015-01-28 NOTE — Telephone Encounter (Signed)
Pt is asking if Dr Maudie Mercury thinks she should get the shingles vaccine

## 2015-01-29 ENCOUNTER — Ambulatory Visit (INDEPENDENT_AMBULATORY_CARE_PROVIDER_SITE_OTHER): Payer: BLUE CROSS/BLUE SHIELD | Admitting: Family Medicine

## 2015-01-29 ENCOUNTER — Encounter: Payer: Self-pay | Admitting: Family Medicine

## 2015-01-29 VITALS — BP 132/84 | HR 74 | Ht 66.0 in | Wt 143.0 lb

## 2015-01-29 DIAGNOSIS — M9901 Segmental and somatic dysfunction of cervical region: Secondary | ICD-10-CM | POA: Diagnosis not present

## 2015-01-29 DIAGNOSIS — M5030 Other cervical disc degeneration, unspecified cervical region: Secondary | ICD-10-CM | POA: Diagnosis not present

## 2015-01-29 DIAGNOSIS — M9902 Segmental and somatic dysfunction of thoracic region: Secondary | ICD-10-CM

## 2015-01-29 DIAGNOSIS — M999 Biomechanical lesion, unspecified: Secondary | ICD-10-CM

## 2015-01-29 DIAGNOSIS — M9908 Segmental and somatic dysfunction of rib cage: Secondary | ICD-10-CM

## 2015-01-29 NOTE — Progress Notes (Signed)
[name] [address], [place] [address] Phone: [phone] Subjective:    I'm seeing this patient by the request  of:  Lucretia Kern., DO   CC: Neck pain  [id number] Lindsay [name] is a 64 y.o. female coming in with complaint of neck pain. Patient has a history of moderate arthritis in the neck that was seen on x-rays back in 2011. Patient elected to try conservative therapy and was given home exercises, icing protocol, as well as postural changes. Patient was to get over-the-counter natural supplementations. Patient states that she continues to improve but it is very slow. Denies any radiation of pain. Seems to be more into the right scapular region today. No numbness or tingling but it is affecting some of her job performance. Has tried to change some of the positioning at work. Not doing on a regular basis though. Not doing home exercises on a regular basis either.    Past Medical History  Diagnosis Date  . COMMON MIGRAINE 09/20/2006  . DEPRESSION 06/11/2009  . GERD 06/11/2009  . Mood disorder [facility])    Past Surgical History  Procedure Laterality Date  . Cholecystectomy    . Abdominal hysterectomy    . Ovarian cyst removal    . Tonsillectomy    . Repair extensor tendon hand     Social History  Substance Use Topics  . Smoking status: Former Smoker    Types: Cigarettes    Quit date: 04/04/2004  . Smokeless tobacco: Never Used  . Alcohol Use: 0.0 oz/week    0 Standard drinks or equivalent per week     Comment: occ   No Known Allergies Family History  Problem Relation Age of Onset  . Dementia Mother   . Osteoporosis Mother   . Breast cancer Sister   . Heart attack Brother   . Colon cancer Neg Hx   . Esophageal cancer Neg Hx   . Rectal cancer Neg Hx   . Stomach cancer Neg Hx         Past medical history, social, surgical and family history all reviewed in electronic medical record.   Review of Systems: No headache,  visual changes, nausea, vomiting, diarrhea, constipation, dizziness, abdominal pain, skin rash, fevers, chills, night sweats, weight loss, swollen lymph nodes, body aches, joint swelling, muscle aches, chest pain, shortness of breath, mood changes.   Objective Blood pressure 132/84, pulse 74, height 5\' 6"  (1.676 m), weight 143 lb (64.864 kg), SpO2 97 %.  General: No apparent distress alert and oriented x3 mood and affect normal, dressed appropriately.  HEENT: Pupils equal, extraocular movements intact  Respiratory: Patient's speak in full sentences and does not appear short of breath  Cardiovascular: No lower extremity edema, non tender, no erythema  Skin: Warm dry intact with no signs of infection or rash on extremities or on axial skeleton.  Abdomen: Soft nontender  Neuro: Cranial nerves II through XII are intact, neurovascularly intact in all extremities with 2+ DTRs and 2+ pulses.  Lymph: No lymphadenopathy of posterior or anterior cervical chain or axillae bilaterally.  Gait normal with good balance and coordination.  MSK:  Non tender with full range of motion and good stability and symmetric strength and tone of shoulders, elbows, wrist, hip, knee and ankles bilaterally.  Neck: Inspection unremarkable. No palpable stepoffs. Negative Spurling's maneuver.  .  Still lacking the last 5 of extension and now the last 5 of side bending to the right Grip  strength and sensation normal in bilateral hands Strength good C4 to T1 distribution No sensory change to C4 to T1 Negative Hoffman sign bilaterally Reflexes normal  Osteopathic findings C2 flexed rotated and side bent left C4 flexed rotated and side bent right T3 extended rotated and side bent right inhaled third rib T5 extended rotated and side bent left T7 extended rotated and side bent left      Impression and Recommendations:     This case required medical decision making of moderate complexity.

## 2015-01-29 NOTE — Telephone Encounter (Signed)
It is an optional recommended vaccine in those over 60. She should check with her insurance regarding cost to see if she wants to do it.

## 2015-01-29 NOTE — Assessment & Plan Note (Signed)
Some more stiffness from previous exam. We discussed icing regimen and home exercises. We discussed the importance of posture and other changes she can make to her working position. We discussed scapular strengthening and icing. Patient will come back and see me again in 4 weeks for further evaluation and treatment.

## 2015-01-29 NOTE — Telephone Encounter (Signed)
Patient informed of the message below.

## 2015-01-29 NOTE — Patient Instructions (Addendum)
Great to see you  Try to tell that husband to stop being a pain in the neck Remember the posture throughout the day.  This will be the most helpful.  Ice is your friend Continue the vitamins Keep the tennis ball between shoulder blades.

## 2015-01-29 NOTE — Progress Notes (Signed)
Pre visit review using our clinic review tool, if applicable. No additional management support is needed unless otherwise documented below in the visit note. 

## 2015-01-29 NOTE — Assessment & Plan Note (Signed)
Decision today to treat with OMT was based on Physical Exam  After verbal consent patient was treated with ME, FPR techniques in cervical, thoracic and rib areas  Patient tolerated the procedure well with improvement in symptoms  Patient given exercises, stretches and lifestyle modifications  See medications in patient instructions if given  Patient will follow up in 4 weeks        

## 2015-03-02 ENCOUNTER — Ambulatory Visit (INDEPENDENT_AMBULATORY_CARE_PROVIDER_SITE_OTHER): Payer: BLUE CROSS/BLUE SHIELD | Admitting: Family Medicine

## 2015-03-02 ENCOUNTER — Encounter: Payer: Self-pay | Admitting: *Deleted

## 2015-03-02 ENCOUNTER — Encounter: Payer: Self-pay | Admitting: Family Medicine

## 2015-03-02 VITALS — BP 136/74 | HR 69 | Ht 66.0 in | Wt 143.0 lb

## 2015-03-02 DIAGNOSIS — M9902 Segmental and somatic dysfunction of thoracic region: Secondary | ICD-10-CM | POA: Diagnosis not present

## 2015-03-02 DIAGNOSIS — M9901 Segmental and somatic dysfunction of cervical region: Secondary | ICD-10-CM | POA: Diagnosis not present

## 2015-03-02 DIAGNOSIS — M5030 Other cervical disc degeneration, unspecified cervical region: Secondary | ICD-10-CM | POA: Diagnosis not present

## 2015-03-02 DIAGNOSIS — M999 Biomechanical lesion, unspecified: Secondary | ICD-10-CM

## 2015-03-02 DIAGNOSIS — M9908 Segmental and somatic dysfunction of rib cage: Secondary | ICD-10-CM

## 2015-03-02 NOTE — Assessment & Plan Note (Signed)
Decision today to treat with OMT was based on Physical Exam  After verbal consent patient was treated with ME, FPR techniques in cervical, thoracic and rib areas  Patient tolerated the procedure well with improvement in symptoms  Patient given exercises, stretches and lifestyle modifications  See medications in patient instructions if given  Patient will follow up in 4 weeks        

## 2015-03-02 NOTE — Progress Notes (Signed)
Pre visit review using our clinic review tool, if applicable. No additional management support is needed unless otherwise documented below in the visit note. 

## 2015-03-02 NOTE — Progress Notes (Signed)
[name] [address] [place], [place] [address] Phone: [phone] Subjective:     CC: Neck pain follow up.   [id number] Lindsay [name] is a 64 y.o. female coming in with complaint of neck pain. Patient has a history of moderate arthritis in the neck that was seen on x-rays back in 2011. Patient elected to try conservative therapy and was given home exercises, icing protocol, as well as postural changes.  Patient has been doing very well but is having somewhat of an exacerbation. Patient did put up Christmas lights and since then has had more difficulty as well as discomfort. Patient describes it as more of a dull, throbbing aching sensation. Patient states mild radiation of the right arm. Nothing that seems to be constant. Continues to stay active.   Past Medical History  Diagnosis Date  . COMMON MIGRAINE 09/20/2006  . DEPRESSION 06/11/2009  . GERD 06/11/2009  . Mood disorder [facility])    Past Surgical History  Procedure Laterality Date  . Cholecystectomy    . Abdominal hysterectomy    . Ovarian cyst removal    . Tonsillectomy    . Repair extensor tendon hand     Social History  Substance Use Topics  . Smoking status: Former Smoker    Types: Cigarettes    Quit date: 04/04/2004  . Smokeless tobacco: Never Used  . Alcohol Use: 0.0 oz/week    0 Standard drinks or equivalent per week     Comment: occ   No Known Allergies Family History  Problem Relation Age of Onset  . Dementia Mother   . Osteoporosis Mother   . Breast cancer Sister   . Heart attack Brother   . Colon cancer Neg Hx   . Esophageal cancer Neg Hx   . Rectal cancer Neg Hx   . Stomach cancer Neg Hx         Past medical history, social, surgical and family history all reviewed in electronic medical record.   Review of Systems: No headache, visual changes, nausea, vomiting, diarrhea, constipation, dizziness, abdominal pain, skin rash, fevers, chills, night sweats, weight  loss, swollen lymph nodes, body aches, joint swelling, muscle aches, chest pain, shortness of breath, mood changes.   Objective Blood pressure 136/74, pulse 69, height 5\' 6"  (1.676 m), weight 143 lb (64.864 kg), SpO2 98 %.  General: No apparent distress alert and oriented x3 mood and affect normal, dressed appropriately.  HEENT: Pupils equal, extraocular movements intact  Respiratory: Patient's speak in full sentences and does not appear short of breath  Cardiovascular: No lower extremity edema, non tender, no erythema  Skin: Warm dry intact with no signs of infection or rash on extremities or on axial skeleton.  Abdomen: Soft nontender  Neuro: Cranial nerves II through XII are intact, neurovascularly intact in all extremities with 2+ DTRs and 2+ pulses.  Lymph: No lymphadenopathy of posterior or anterior cervical chain or axillae bilaterally.  Gait normal with good balance and coordination.  MSK:  Non tender with full range of motion and good stability and symmetric strength and tone of shoulders, elbows, wrist, hip, knee and ankles bilaterally.  Neck: Inspection unremarkable. No palpable stepoffs. Negative Spurling's maneuver.  .  Still lacking the last 5 of extension and now the last 5 of side bending to the right Grip strength and sensation normal in bilateral hands Strength good C4 to T1 distribution No sensory change to C4 to T1 Negative Hoffman sign bilaterally Reflexes normal  Osteopathic findings C2 flexed rotated and side bent left C4 flexed rotated and side bent right T1 extended rotated and side bent left T3 extended rotated and side bent right inhaled third rib T5 extended rotated and side bent left       Impression and Recommendations:     This case required medical decision making of moderate complexity.

## 2015-03-02 NOTE — Assessment & Plan Note (Signed)
No signs of true radiculopathy today. Patient did have manipulation done today. I do think that there could be possibly some exacerbation of underlying reflux disease and also be contributing. We discussed icing regimen and home exercises. We discussed which activities to do an which was potentially avoid. Patient and will come back and see me again in 3-4 weeks.

## 2015-03-02 NOTE — Patient Instructions (Addendum)
Good to see you These holidays can be hard.  Stay active Work on posture See me again in 3-4 weeks.

## 2015-03-27 ENCOUNTER — Ambulatory Visit (INDEPENDENT_AMBULATORY_CARE_PROVIDER_SITE_OTHER): Payer: BLUE CROSS/BLUE SHIELD | Admitting: Family Medicine

## 2015-03-27 ENCOUNTER — Encounter: Payer: Self-pay | Admitting: Family Medicine

## 2015-03-27 VITALS — BP 124/84 | HR 76 | Ht 66.0 in | Wt 144.0 lb

## 2015-03-27 DIAGNOSIS — M9902 Segmental and somatic dysfunction of thoracic region: Secondary | ICD-10-CM

## 2015-03-27 DIAGNOSIS — M999 Biomechanical lesion, unspecified: Secondary | ICD-10-CM

## 2015-03-27 DIAGNOSIS — M9901 Segmental and somatic dysfunction of cervical region: Secondary | ICD-10-CM

## 2015-03-27 DIAGNOSIS — M5030 Other cervical disc degeneration, unspecified cervical region: Secondary | ICD-10-CM

## 2015-03-27 DIAGNOSIS — M9908 Segmental and somatic dysfunction of rib cage: Secondary | ICD-10-CM | POA: Diagnosis not present

## 2015-03-27 NOTE — Patient Instructions (Addendum)
Goo dot see you Posture posture posture Happy holidays!  Start the gabapentin at night again See me again in 4-6 weeks.

## 2015-03-27 NOTE — Progress Notes (Signed)
[name] [address] [place], [place] [address] Phone: [phone] Subjective:     CC: Neck pain follow up.   [id number] Lindsay [name] is a 64 y.o. female coming in with complaint of neck pain. Patient has a history of moderate arthritis in the neck that was seen on x-rays back in 2011. Patient elected to try conservative therapy and was given home exercises, icing protocol, as well as postural changes.  Patient has had some more radicular symptoms as well.  No weakness, mild numbness. A little tighter but has not been doing the exercises.  No headaches but more stress.      Past Medical History  Diagnosis Date  . COMMON MIGRAINE 09/20/2006  . DEPRESSION 06/11/2009  . GERD 06/11/2009  . Mood disorder [facility])    Past Surgical History  Procedure Laterality Date  . Cholecystectomy    . Abdominal hysterectomy    . Ovarian cyst removal    . Tonsillectomy    . Repair extensor tendon hand     Social History  Substance Use Topics  . Smoking status: Former Smoker    Types: Cigarettes    Quit date: 04/04/2004  . Smokeless tobacco: Never Used  . Alcohol Use: 0.0 oz/week    0 Standard drinks or equivalent per week     Comment: occ   No Known Allergies Family History  Problem Relation Age of Onset  . Dementia Mother   . Osteoporosis Mother   . Breast cancer Sister   . Heart attack Brother   . Colon cancer Neg Hx   . Esophageal cancer Neg Hx   . Rectal cancer Neg Hx   . Stomach cancer Neg Hx        Past medical history, social, surgical and family history all reviewed in electronic medical record.   Review of Systems: No headache, visual changes, nausea, vomiting, diarrhea, constipation, dizziness, abdominal pain, skin rash, fevers, chills, night sweats, weight loss, swollen lymph nodes, body aches, joint swelling, muscle aches, chest pain, shortness of breath, mood changes.   Objective Blood pressure 124/84, pulse 76, height 5\' 6"  (1.676  m), weight 144 lb (65.318 kg), SpO2 98 %.  General: No apparent distress alert and oriented x3 mood and affect normal, dressed appropriately.  HEENT: Pupils equal, extraocular movements intact  Respiratory: Patient's speak in full sentences and does not appear short of breath  Cardiovascular: No lower extremity edema, non tender, no erythema  Skin: Warm dry intact with no signs of infection or rash on extremities or on axial skeleton.  Abdomen: Soft nontender  Neuro: Cranial nerves II through XII are intact, neurovascularly intact in all extremities with 2+ DTRs and 2+ pulses.  Lymph: No lymphadenopathy of posterior or anterior cervical chain or axillae bilaterally.  Gait normal with good balance and coordination.  MSK:  Non tender with full range of motion and good stability and symmetric strength and tone of shoulders, elbows, wrist, hip, knee and ankles bilaterally.  Neck: Inspection unremarkable. No palpable stepoffs. Mild Spurling's maneuver on right side  .  Still lacking the last 5 of extension and now the last 5 of side bending to the right Grip strength and sensation normal in bilateral hands Strength good C4 to T1 distribution No sensory change to C4 to T1 Negative Hoffman sign bilaterally Reflexes normal  Osteopathic findings C2 flexed rotated and side bent left C4 flexed rotated and side bent right T1 extended rotated and side bent left  T3 extended rotated and side bent right inhaled third rib T5 extended rotated and side bent left L2 flexed rotated and side bent right    Impression and Recommendations:     This case required medical decision making of moderate complexity.

## 2015-03-27 NOTE — Assessment & Plan Note (Signed)
Asian having mild radiculopathy. We discussed icing regimen and home exercises. We discussed that patient should take the gabapentin on a regular basis which she has not. We discussed icing regimen as well. Patient will try to change her. We'll be getting a new chair at work in the next several weeks. Patient will come back and see me again in 3-4 weeks for further evaluation and treatment.

## 2015-03-27 NOTE — Progress Notes (Signed)
Pre visit review using our clinic review tool, if applicable. No additional management support is needed unless otherwise documented below in the visit note. 

## 2015-03-27 NOTE — Assessment & Plan Note (Signed)
Decision today to treat with OMT was based on Physical Exam  After verbal consent patient was treated with  ME, FPR techniques in cervical, thoracic and rib areas  Patient tolerated the procedure well with improvement in symptoms  Patient given exercises, stretches and lifestyle modifications  See medications in patient instructions if given  Patient will follow up in 4-6 weeks

## 2015-04-13 ENCOUNTER — Telehealth: Payer: Self-pay | Admitting: Family Medicine

## 2015-04-13 DIAGNOSIS — G8929 Other chronic pain: Secondary | ICD-10-CM

## 2015-04-13 DIAGNOSIS — M542 Cervicalgia: Secondary | ICD-10-CM

## 2015-04-13 DIAGNOSIS — M25562 Pain in left knee: Secondary | ICD-10-CM

## 2015-04-13 NOTE — Telephone Encounter (Signed)
Called patient back  Xray for the knee and the neck  Continued swelling of knee May need to be seen based on xray.

## 2015-04-13 NOTE — Telephone Encounter (Signed)
Pt was told to let you know how she is doing and she said the pain is getting pretty bad and is wondering if you want to send her for an xray. Can you please give her a call at [phone]

## 2015-04-14 ENCOUNTER — Telehealth: Payer: Self-pay | Admitting: Family Medicine

## 2015-04-14 ENCOUNTER — Ambulatory Visit (INDEPENDENT_AMBULATORY_CARE_PROVIDER_SITE_OTHER)
Admission: RE | Admit: 2015-04-14 | Discharge: 2015-04-14 | Disposition: A | Discharge status: Home / Self Care | Payer: BLUE CROSS/BLUE SHIELD | Source: Ambulatory Visit | Attending: Family Medicine | Admitting: Family Medicine

## 2015-04-14 DIAGNOSIS — M25562 Pain in left knee: Secondary | ICD-10-CM

## 2015-04-14 DIAGNOSIS — M542 Cervicalgia: Secondary | ICD-10-CM | POA: Diagnosis not present

## 2015-04-14 DIAGNOSIS — G8929 Other chronic pain: Secondary | ICD-10-CM

## 2015-04-14 NOTE — Telephone Encounter (Signed)
close

## 2015-05-05 ENCOUNTER — Ambulatory Visit (INDEPENDENT_AMBULATORY_CARE_PROVIDER_SITE_OTHER): Payer: BLUE CROSS/BLUE SHIELD | Admitting: Family Medicine

## 2015-05-05 ENCOUNTER — Encounter: Payer: Self-pay | Admitting: Family Medicine

## 2015-05-05 ENCOUNTER — Other Ambulatory Visit (INDEPENDENT_AMBULATORY_CARE_PROVIDER_SITE_OTHER): Payer: BLUE CROSS/BLUE SHIELD

## 2015-05-05 VITALS — BP 128/84 | HR 75 | Ht 66.0 in | Wt 142.0 lb

## 2015-05-05 DIAGNOSIS — M25561 Pain in right knee: Secondary | ICD-10-CM

## 2015-05-05 DIAGNOSIS — M9908 Segmental and somatic dysfunction of rib cage: Secondary | ICD-10-CM

## 2015-05-05 DIAGNOSIS — M5030 Other cervical disc degeneration, unspecified cervical region: Secondary | ICD-10-CM

## 2015-05-05 DIAGNOSIS — M9901 Segmental and somatic dysfunction of cervical region: Secondary | ICD-10-CM

## 2015-05-05 DIAGNOSIS — M9902 Segmental and somatic dysfunction of thoracic region: Secondary | ICD-10-CM | POA: Diagnosis not present

## 2015-05-05 DIAGNOSIS — M23300 Other meniscus derangements, unspecified lateral meniscus, right knee: Secondary | ICD-10-CM

## 2015-05-05 DIAGNOSIS — M999 Biomechanical lesion, unspecified: Secondary | ICD-10-CM

## 2015-05-05 NOTE — Progress Notes (Signed)
Pre visit review using our clinic review tool, if applicable. No additional management support is needed unless otherwise documented below in the visit note. 

## 2015-05-05 NOTE — Assessment & Plan Note (Signed)
Discussed with patient again at great length. Patient once avoid any significant intervention at this time. We'll continue with the conservative therapy with home exercises. We did do some mild manipulation but has caused exacerbation previously in the past. We discussed icing regimen. We discussed the possibility of injections of the carpal tunnel to see if this would help tell us how much of the neurologic processes giving her difficulty from the neck or from the carpal tunnel. Also we can consider EMG. At this point we will continue conservative therapy and patient will follow-up in 3 weeks. Nose to seek medical attention if worsening symptoms.

## 2015-05-05 NOTE — Assessment & Plan Note (Signed)
Decision today to treat with OMT was based on Physical Exam  After verbal consent patient was treated with ME, FPR techniques in cervical, thoracic and rib areas  Patient tolerated the procedure well with improvement in symptoms  Patient given exercises, stretches and lifestyle modifications  See medications in patient instructions if given  Patient will follow up in 3 weeks  

## 2015-05-05 NOTE — Assessment & Plan Note (Signed)
Patient given injection today and tolerated the procedure very well. We discussed icing regimen and home exercises. We discussed which activities to do an which was potentially avoid. Patient will do bracing feels better. Topical anti-inflammatory given.Patient does well we'll continue conservative therapy. If worsening symptoms we'll consider advanced imaging.

## 2015-05-05 NOTE — Patient Instructions (Signed)
Good to see you  Ice is your friend Exercises 3 times a week.  Avoid twisting motion  Wear the brace if you want.  OK to bike or elliptical but not a lot else.  See me again in 3 weeks.

## 2015-05-05 NOTE — Progress Notes (Signed)
[name] [address] [place], [place] [address] Phone: [phone] Subjective:     CC: Neck pain follow up.  Right knee pain  [id number] Lindsay [name] is a 65 y.o. female coming in with complaint of neck pain. Patient has a history of moderate arthritis in the neck that was seen on x-rays back in 2011. Patient had repeat x-rays recently and has shown significant progression of the arthritic changes of the neck. These were individually visualized by me again today.  Patient states that overall she seems to be doing relatively well. Continues to have a small amount of radicular symptoms seeming to go to the first 3 fingers on the hands bilaterally. Does do a lot of typing and has some mild wrist discomfort as well. Does not know what is coming from her neck or what could be coming from a very mild carpal tunnel syndrome. Denies though any weakness.  Patient is also complaining of right knee pain. Has been hurting her for some time but has worsened. States that it is catching motion when she does twisting. Pain more on the lateral aspect. No significant swelling that she is appreciated. Does not remember any injury. Rates the severity of pain a 6 out of 10. Does respond somewhat to over-the-counter anti-inflammatories when needed.    Past Medical History  Diagnosis Date  . COMMON MIGRAINE 09/20/2006  . DEPRESSION 06/11/2009  . GERD 06/11/2009  . Mood disorder [facility])    Past Surgical History  Procedure Laterality Date  . Cholecystectomy    . Abdominal hysterectomy    . Ovarian cyst removal    . Tonsillectomy    . Repair extensor tendon hand     Social History  Substance Use Topics  . Smoking status: Former Smoker    Types: Cigarettes    Quit date: 04/04/2004  . Smokeless tobacco: Never Used  . Alcohol Use: 0.0 oz/week    0 Standard drinks or equivalent per week     Comment: occ   No Known Allergies Family History  Problem Relation Age of  Onset  . Dementia Mother   . Osteoporosis Mother   . Breast cancer Sister   . Heart attack Brother   . Colon cancer Neg Hx   . Esophageal cancer Neg Hx   . Rectal cancer Neg Hx   . Stomach cancer Neg Hx        Past medical history, social, surgical and family history all reviewed in electronic medical record.   Review of Systems: No headache, visual changes, nausea, vomiting, diarrhea, constipation, dizziness, abdominal pain, skin rash, fevers, chills, night sweats, weight loss, swollen lymph nodes, body aches, joint swelling, muscle aches, chest pain, shortness of breath, mood changes.   Objective Blood pressure 128/84, pulse 75, height 5\' 6"  (1.676 m), weight 142 lb (64.411 kg), SpO2 94 %.  General: No apparent distress alert and oriented x3 mood and affect normal, dressed appropriately.  HEENT: Pupils equal, extraocular movements intact  Respiratory: Patient's speak in full sentences and does not appear short of breath  Cardiovascular: No lower extremity edema, non tender, no erythema  Skin: Warm dry intact with no signs of infection or rash on extremities or on axial skeleton.  Abdomen: Soft nontender  Neuro: Cranial nerves II through XII are intact, neurovascularly intact in all extremities with 2+ DTRs and 2+ pulses.  Lymph: No lymphadenopathy of posterior or anterior cervical chain or axillae bilaterally.  Gait normal with good  balance and coordination.  MSK:  Non tender with full range of motion and good stability and symmetric strength and tone of shoulders, elbows, wrist, hip, knee and ankles bilaterally.  Neck: Inspection unremarkable. No palpable stepoffs. Mild Spurling's maneuver on right side  .  Still lacking the last 5 of extension and now the last 5 of side bending to the right Grip strength and sensation normal in bilateral hands Strength good C4 to T1 distribution No sensory change to C4 to T1 Negative Hoffman sign bilaterally Reflexes normal No change from  previous exam  Knee: Right Normal to inspection with no erythema or effusion or obvious bony abnormalities. Palpation normal with no warmth, joint line tenderness, patellar tenderness, or condyle tenderness. ROM full in flexion and extension and lower leg rotation. Ligaments with solid consistent endpoints including ACL, PCL, LCL, MCL. Positive Mcmurray's, Apley's, and Thessalonian tests. Non painful patellar compression. Patellar glide without crepitus. Patellar and quadriceps tendons unremarkable. Hamstring and quadriceps strength is normal.  Contralateral knee unremarkable  MSK US performed of: Right This study was ordered, performed, and interpreted by [name] D.O.  Knee: All structures visualized. Moderate narrowing of the medial joint line with some mild degenerative changes of the medial meniscus Patient does have an acute on chronic tear of the lateral meniscus with significant hypoechoic changes. 25% displacement Patellar Tendon unremarkable on long and transverse views without effusion. No abnormality of prepatellar bursa. LCL and MCL unremarkable on long and transverse views. No abnormality of origin of medial or lateral head of the gastrocnemius.  IMPRESSION:  Lateral meniscus tear with displacement.  After informed written and verbal consent, patient was seated on exam table. Right knee was prepped with alcohol swab and utilizing anterolateral approach, patient's right knee space was injected with 4:1  marcaine 0.5%: Kenalog 40mg /dL. Patient tolerated the procedure well without immediate complications. Osteopathic findings C2 flexed rotated and side bent left C4 flexed rotated and side bent right T1 extended rotated and side bent left T3 extended rotated and side bent right inhaled third rib T5 extended rotated and side bent left L2 flexed rotated and side bent right    Impression and Recommendations:     This case required medical decision making of moderate  complexity.

## 2015-05-07 ENCOUNTER — Telehealth: Payer: Self-pay | Admitting: Family Medicine

## 2015-05-07 DIAGNOSIS — Z0000 Encounter for general adult medical examination without abnormal findings: Secondary | ICD-10-CM

## 2015-05-07 NOTE — Telephone Encounter (Signed)
Sure, please place lipid panel and bmp. Thanks.

## 2015-05-07 NOTE — Addendum Note (Signed)
Addended by: Agnes Lawrence on: 05/07/2015 02:58 PM   Modules accepted: Orders

## 2015-05-07 NOTE — Telephone Encounter (Signed)
Pt would like to know if she can come in a week before her physical and do her labs.  Her physical will be scheduled in March

## 2015-05-07 NOTE — Telephone Encounter (Signed)
I left a detailed message at the pts cell number this was approved by Dr Maudie Mercury and that she call the office to schedule a lab appt one week prior to the physical and to be fasting as well.

## 2015-05-12 ENCOUNTER — Other Ambulatory Visit: Payer: Self-pay | Admitting: *Deleted

## 2015-05-12 DIAGNOSIS — Z0000 Encounter for general adult medical examination without abnormal findings: Secondary | ICD-10-CM

## 2015-05-12 NOTE — Telephone Encounter (Signed)
Lindsay [name]-is this OK to change the location for labs?

## 2015-05-12 NOTE — Telephone Encounter (Signed)
Pt has an appt with dr Tamala Julian at [facility] on 05-28-15 and would like to have her labs drawn at [place]. Please put order  In for [facility]

## 2015-05-12 NOTE — Telephone Encounter (Signed)
Sure. Thanks 

## 2015-05-12 NOTE — Telephone Encounter (Signed)
Orders re-entered for the [facility] office.

## 2015-05-26 ENCOUNTER — Ambulatory Visit (INDEPENDENT_AMBULATORY_CARE_PROVIDER_SITE_OTHER): Payer: BLUE CROSS/BLUE SHIELD | Admitting: Family Medicine

## 2015-05-26 ENCOUNTER — Encounter: Payer: Self-pay | Admitting: Family Medicine

## 2015-05-26 ENCOUNTER — Other Ambulatory Visit (INDEPENDENT_AMBULATORY_CARE_PROVIDER_SITE_OTHER): Payer: BLUE CROSS/BLUE SHIELD

## 2015-05-26 VITALS — BP 118/78 | HR 72 | Wt 141.0 lb

## 2015-05-26 DIAGNOSIS — M9901 Segmental and somatic dysfunction of cervical region: Secondary | ICD-10-CM | POA: Diagnosis not present

## 2015-05-26 DIAGNOSIS — Z0000 Encounter for general adult medical examination without abnormal findings: Secondary | ICD-10-CM

## 2015-05-26 DIAGNOSIS — M9902 Segmental and somatic dysfunction of thoracic region: Secondary | ICD-10-CM | POA: Diagnosis not present

## 2015-05-26 DIAGNOSIS — M9908 Segmental and somatic dysfunction of rib cage: Secondary | ICD-10-CM

## 2015-05-26 DIAGNOSIS — M5030 Other cervical disc degeneration, unspecified cervical region: Secondary | ICD-10-CM

## 2015-05-26 DIAGNOSIS — M999 Biomechanical lesion, unspecified: Secondary | ICD-10-CM

## 2015-05-26 LAB — BASIC METABOLIC PANEL
BUN: 12 mg/dL (ref 6–23)
CALCIUM: 9.4 mg/dL (ref 8.4–10.5)
CO2: 30 mEq/L (ref 19–32)
CREATININE: 0.89 mg/dL (ref 0.40–1.20)
Chloride: 105 mEq/L (ref 96–112)
GFR: 68.18 mL/min (ref >60.00)
GLUCOSE: 89 mg/dL (ref 70–99)
Potassium: 4.1 mEq/L (ref 3.5–5.1)
Sodium: 140 mEq/L (ref 135–145)

## 2015-05-26 LAB — LIPID PANEL
CHOL/HDL RATIO: 3
Cholesterol: 207 mg/dL — ABNORMAL HIGH (ref 0–200)
HDL: 68.6 mg/dL (ref >39.00)
LDL Cholesterol: 117 mg/dL — ABNORMAL HIGH (ref 0–99)
NonHDL: 138.14
TRIGLYCERIDES: 106 mg/dL (ref 0.0–149.0)
VLDL: 21.2 mg/dL (ref 0.0–40.0)

## 2015-05-26 NOTE — Progress Notes (Signed)
[name] [address] [place], [place] [address] Phone: [phone] Subjective:     CC: Neck pain follow up.  Right knee pain  [id number] Lindsay [name] is a 65 y.o. female coming in with complaint of neck pain. Patient has a history of moderate arthritis in the neck that was seen on x-rays back in 2011. Patient had repeat x-rays recently and has shown significant progression of the arthritic changes of the neck. These were individually visualized by me again today.  Patient states that overall she seems to be doing relatively well. Has had some increased stress in her work. This is causing some mild increase and neck pain. Patient knows that it seems to be only situational. States that once she seems to relax the pain seems to get better as well. Unable to take gabapentin 3 times a day because she forgets. Continues to take it 2 times a day. No synovial radiation down the arm and would state only intermittent numbness.  Patient is also complaining of right knee pain. Found have a lateral meniscal tear of the knee. Patient was given an injection and states that she feels 100% better. States that it even helped her other knee. Overall her knees feel completely normal. No locking, giving out on her any swelling noted.   Past Medical History  Diagnosis Date  . COMMON MIGRAINE 09/20/2006  . DEPRESSION 06/11/2009  . GERD 06/11/2009  . Mood disorder [facility])    Past Surgical History  Procedure Laterality Date  . Cholecystectomy    . Abdominal hysterectomy    . Ovarian cyst removal    . Tonsillectomy    . Repair extensor tendon hand     Social History  Substance Use Topics  . Smoking status: Former Smoker    Types: Cigarettes    Quit date: 04/04/2004  . Smokeless tobacco: Never Used  . Alcohol Use: 0.0 oz/week    0 Standard drinks or equivalent per week     Comment: occ   No Known Allergies Family History  Problem Relation Age of Onset  . Dementia  Mother   . Osteoporosis Mother   . Breast cancer Sister   . Heart attack Brother   . Colon cancer Neg Hx   . Esophageal cancer Neg Hx   . Rectal cancer Neg Hx   . Stomach cancer Neg Hx        Past medical history, social, surgical and family history all reviewed in electronic medical record.   Review of Systems: No headache, visual changes, nausea, vomiting, diarrhea, constipation, dizziness, abdominal pain, skin rash, fevers, chills, night sweats, weight loss, swollen lymph nodes, body aches, joint swelling, muscle aches, chest pain, shortness of breath, mood changes.   Objective Blood pressure 118/78, pulse 72, weight 141 lb (63.957 kg).  General: No apparent distress alert and oriented x3 mood and affect normal, dressed appropriately.  HEENT: Pupils equal, extraocular movements intact  Respiratory: Patient's speak in full sentences and does not appear short of breath  Cardiovascular: No lower extremity edema, non tender, no erythema  Skin: Warm dry intact with no signs of infection or rash on extremities or on axial skeleton.  Abdomen: Soft nontender  Neuro: Cranial nerves II through XII are intact, neurovascularly intact in all extremities with 2+ DTRs and 2+ pulses.  Lymph: No lymphadenopathy of posterior or anterior cervical chain or axillae bilaterally.  Gait normal with good balance and coordination.  MSK:  Non tender with  full range of motion and good stability and symmetric strength and tone of shoulders, elbows, wrist, hip, knee and ankles bilaterally.  Neck: Inspection unremarkable. No palpable stepoffs. Mild Spurling's maneuver on right side  Still present Still lacking the last 5 of extension and now the last 5 of side bending to the right Grip strength and sensation normal in bilateral hands Strength good C4 to T1 distribution No sensory change to C4 to T1 Negative Hoffman sign bilaterally Reflexes normal No significant change from previous exam  Knee:  Right Normal to inspection with no erythema or effusion or obvious bony abnormalities. Palpation normal with no warmth, joint line tenderness, patellar tenderness, or condyle tenderness. ROM full in flexion and extension and lower leg rotation. Ligaments with solid consistent endpoints including ACL, PCL, LCL, MCL. minorly positiveMcmurray's, Apley's, and Thessalonian tests significant improvement Non painful patellar compression. Patellar glide without crepitus. Patellar and quadriceps tendons unremarkable. Hamstring and quadriceps strength is normal.  Contralateral knee unremarkable     Osteopathic findings C2 flexed rotated and side bent right with severemuscle tightness  T1 extended rotated and side bent left T3 extended rotated and side bent right inhaled third rib T5 extended rotated and side bent left L2 flexed rotated and side bent right    Impression and Recommendations:     This case required medical decision making of moderate complexity.

## 2015-05-26 NOTE — Patient Instructions (Signed)
Great to see you  Keep it up Dont forget about the 2 tennis ball in the tube sock.  Ice can help Consider asking Dr. Maudie Mercury about a change of wellbutrin to effexor to help with te nerve pain in addition to the anxiety .   See me again in 4-6 weeks.

## 2015-05-26 NOTE — Assessment & Plan Note (Signed)
Decision today to treat with OMT was based on Physical Exam  After verbal consent patient was treated with ME, FPR techniques in cervical, thoracic and rib areas  Patient tolerated the procedure well with improvement in symptoms  Patient given exercises, stretches and lifestyle modifications  See medications in patient instructions if given  Patient will follow up in 3 weeks  

## 2015-05-26 NOTE — Assessment & Plan Note (Signed)
Patient does have known degenerative cervical disc disease is likely contributing to most of her pain. I do feel that patient's underlying depression and anxiety could also be contribute in. We discussed the possibility of changing medications in patient will check with per Medicare provider to see if this will be beneficial. We also discussed ergonomic changes. Patient will do some stress relieving techniques. Follow-up again in 3-4 weeks.

## 2015-06-04 ENCOUNTER — Ambulatory Visit (INDEPENDENT_AMBULATORY_CARE_PROVIDER_SITE_OTHER): Payer: BLUE CROSS/BLUE SHIELD | Admitting: Family Medicine

## 2015-06-04 ENCOUNTER — Encounter: Payer: Self-pay | Admitting: Family Medicine

## 2015-06-04 VITALS — BP 108/82 | HR 74 | Temp 98.1°F | Ht 65.0 in | Wt 144.3 lb

## 2015-06-04 DIAGNOSIS — Z0000 Encounter for general adult medical examination without abnormal findings: Secondary | ICD-10-CM | POA: Diagnosis not present

## 2015-06-04 DIAGNOSIS — M542 Cervicalgia: Secondary | ICD-10-CM

## 2015-06-04 DIAGNOSIS — K219 Gastro-esophageal reflux disease without esophagitis: Secondary | ICD-10-CM

## 2015-06-04 DIAGNOSIS — F330 Major depressive disorder, recurrent, mild: Secondary | ICD-10-CM

## 2015-06-04 DIAGNOSIS — G43009 Migraine without aura, not intractable, without status migrainosus: Secondary | ICD-10-CM

## 2015-06-04 MED ORDER — SUMATRIPTAN SUCCINATE 100 MG PO TABS: ORAL_TABLET | ORAL | Status: DC

## 2015-06-04 MED ORDER — BUPROPION HCL ER (SR) 150 MG PO TB12: 150.0000 mg | ORAL_TABLET | Freq: Two times a day (BID) | ORAL | Status: DC

## 2015-06-04 MED ORDER — PANTOPRAZOLE SODIUM 40 MG PO TBEC: 40.0000 mg | DELAYED_RELEASE_TABLET | Freq: Every day | ORAL | Status: DC

## 2015-06-04 NOTE — Progress Notes (Signed)
Pre visit review using our clinic review tool, if applicable. No additional management support is needed unless otherwise documented below in the visit note. 

## 2015-06-04 NOTE — Progress Notes (Signed)
HPI:  Here for CPE:  -Concerns and/or follow up today:   GERD: -uses PPI sometimes for chronic GERD, wants refill -rare dysphagia, refused GI evaluation in the past, unchanged -denies: choking, weight loss, nausea, vomiting  Neck Pain/shoulder: -chronic, told DDD, pinched nerve by prior PCP -seeing sport medicine. Dr. Tamala Julian  Migraines: -Uses prn triptan, requests refill  Depression and stress: Chronic depression, treated with Wellbutrin Increased family and work stress recently with increased irritability No significant anxiety, panic disorder, manic symptoms, thoughts of self-harm, worsening depressed mood or psychosis.  Mild hyperlipidemia: -No regular exercise -Reports a healthy diet  -Taking folic acid, vitamin D or calcium: no  -Diabetes and Dyslipidemia Screening:Done  -Hx of HTN: no  -Vaccines: UTD  -pap history: Status post  complete hysterectomy for fibroids per her report  -wants STI testing (Hep C if born [birth year]): no  -FH breast, colon or ovarian ca: see FH Last mammogram: 04/2014, repeat in 1 year advised per radiology report, she agrees to schedule  Last colon cancer screening: Colonoscopy done 08/2014; 10 year repeat advised   -Alcohol, Tobacco, drug use: see social history  Review of Systems - no fevers, unintentional weight loss, vision loss, hearing loss, chest pain, sob, hemoptysis, melena, hematochezia, hematuria, genital discharge, changing or concerning skin lesions, bleeding, bruising, loc, thoughts of self harm or SI  Past Medical History  Diagnosis Date  . COMMON MIGRAINE 09/20/2006  . DEPRESSION 06/11/2009  . GERD 06/11/2009  . Mood disorder [facility])     Past Surgical History  Procedure Laterality Date  . Cholecystectomy    . Abdominal hysterectomy    . Ovarian cyst removal    . Tonsillectomy    . Repair extensor tendon hand      Family History  Problem Relation Age of Onset  . Dementia Mother   . Osteoporosis Mother   . Breast  cancer Sister   . Heart attack Brother   . Colon cancer Neg Hx   . Esophageal cancer Neg Hx   . Rectal cancer Neg Hx   . Stomach cancer Neg Hx     Social History   Social History  . Marital Status: Married    Spouse Name: N/A  . Number of Children: N/A  . Years of Education: N/A   Social History Main Topics  . Smoking status: Former Smoker    Types: Cigarettes    Quit date: 04/04/2004  . Smokeless tobacco: Never Used  . Alcohol Use: 0.0 oz/week    0 Standard drinks or equivalent per week     Comment: occ  . Drug Use: No  . Sexual Activity: Not Asked   Other Topics Concern  . None   Social History Narrative     Current outpatient prescriptions:  .  aspirin 81 MG tablet, Take 81 mg by mouth daily.  , Disp: , Rfl:  .  buPROPion (WELLBUTRIN SR) 150 MG 12 hr tablet, Take 1 tablet (150 mg total) by mouth 2 (two) times daily., Disp: 60 tablet, Rfl: 5 .  Calcium Carbonate-Vit D-Min (CALCIUM 1200 PO), Take 1 tablet by mouth daily.  , Disp: , Rfl:  .  Cholecalciferol (VITAMIN D3) 1000 UNITS capsule, Take 1,000 Units by mouth daily.  , Disp: , Rfl:  .  diphenhydramine-acetaminophen (TYLENOL PM) 25-500 MG TABS, Take 1 tablet by mouth at bedtime as needed., Disp: , Rfl:  .  Docusate Sodium (COLACE PO), Take by mouth., Disp: , Rfl:  .  fish oil-omega-3 fatty acids 1000  MG capsule, Take 1 g by mouth daily., Disp: , Rfl:  .  gabapentin (NEURONTIN) 100 MG capsule, Take 2 capsules (200 mg total) by mouth at bedtime., Disp: 180 capsule, Rfl: 1 .  MAGNESIUM PO, Take by mouth., Disp: , Rfl:  .  niacin 250 MG tablet, Take 250 mg by mouth daily with breakfast.  , Disp: , Rfl:  .  pantoprazole (PROTONIX) 40 MG tablet, Take 1 tablet (40 mg total) by mouth daily., Disp: 30 tablet, Rfl: 5 .  SUMAtriptan (IMITREX) 100 MG tablet, Take 1/2-1 tablet at the first signs of a migraine. May repeat once in 2 hours if needed. Do not take more than 2 doses in 24 hours., Disp: 9 tablet, Rfl: 2 .  Tretinoin,  Facial Wrinkles, (TRETINOIN, EMOLLIENT,) 0.05 % CREA, Apply a small amount to affected area nightly, Disp: 20 g, Rfl: 1 .  vitamin E 400 UNIT capsule, Take 400 Units by mouth daily.  , Disp: , Rfl:   EXAM:  Filed Vitals:   06/04/15 1454  BP: 108/82  Pulse: 74  Temp: 98.1 F (36.7 C)    GENERAL: vitals reviewed and listed below, alert, oriented, appears well hydrated and in no acute distress  HEENT: head atraumatic, PERRLA, normal appearance of eyes, ears, nose and mouth. moist mucus membranes.  NECK: supple, no masses or lymphadenopathy  LUNGS: clear to auscultation bilaterally, no rales, rhonchi or wheeze  CV: HRRR, no peripheral edema or cyanosis, normal pedal pulses  BREAST: Declined   ABDOMEN: bowel sounds normal, soft, non tender to palpation, no masses, no rebound or guarding  GU: Declined  SKIN: no rash or abnormal lesions, declined full skin exam  MS: normal gait, moves all extremities normally  NEURO: CN II-XII grossly intact, normal muscle strength and sensation to light touch on extremities  PSYCH: normal affect, pleasant and cooperative  ASSESSMENT AND PLAN:  Discussed the following assessment and plan:  There are no diagnoses linked to this encounter.  Visit for preventive health examination -Discussed and advised all [place] preventive services health task force level A and B recommendations for age, sex and risks. -Advised at least 150 minutes of exercise per week and a healthy diet low in saturated fats and sweets and consisting of fresh fruits and vegetables, lean meats such as fish and white chicken and whole grains. -labs reviewed, studies and vaccines per orders this encounter  NECK PAIN -seeing Dr. Tamala Julian  Migraine without aura and without status migrainosus, not intractable - Plan: SUMAtriptan (IMITREX) 100 MG tablet  Mild episode of recurrent major depressive disorder ([place]) -Discussed treatment options and risks -She has opted to continue  Wellbutrin and try cognitive behavioral therapy -Follow up in 2-3 months, sooner if needed  Gastroesophageal reflux disease without esophagitis - Plan: pantoprazole (PROTONIX) 40 MG tablet Again advised GI evaluation for the dysphagia, however she reports it is rare and she does not wish to pursue evaluation at this time. Did discuss potential causes of dysphagia including serious etiology such as cancer. She uses her PPI intermittently when she has an episode of symptoms.   No orders of the defined types were placed in this encounter.    Patient advised to return to clinic immediately if symptoms worsen or persist or new concerns.  Patient Instructions  BEFORE YOU LEAVE: -schedule follow up in 3-4 months  Schedule your mammogram  Call to schedule cognitive behavioral therapy to help with the stress. Follow up sooner here if worsening symptoms or if not improving.  -  We have ordered labs or studies at this visit. It can take up to 1-2 weeks for results and processing. We will contact you with instructions IF your results are abnormal. Normal results will be released to your [facility]. If you have not heard from [place] or can not find your results in [facility] in 2 weeks please contact our office.  We recommend the following healthy lifestyle measures: - eat a healthy whole foods diet consisting of regular small meals composed of vegetables, fruits, beans, nuts, seeds, healthy meats such as white chicken and fish and whole grains.  - avoid sweets, white starchy foods, fried foods, fast food, processed foods, sodas, red meet and other fattening foods.  - get a least 150-300 minutes of aerobic exercise per week.             No Follow-up on file.  Colin Benton R.

## 2015-06-04 NOTE — Patient Instructions (Signed)
BEFORE YOU LEAVE: -schedule follow up in 3-4 months  Schedule your mammogram  Call to schedule cognitive behavioral therapy to help with the stress. Follow up sooner here if worsening symptoms or if not improving.  -We have ordered labs or studies at this visit. It can take up to 1-2 weeks for results and processing. We will contact you with instructions IF your results are abnormal. Normal results will be released to your [facility]. If you have not heard from [place] or can not find your results in [facility] in 2 weeks please contact our office.  We recommend the following healthy lifestyle measures: - eat a healthy whole foods diet consisting of regular small meals composed of vegetables, fruits, beans, nuts, seeds, healthy meats such as white chicken and fish and whole grains.  - avoid sweets, white starchy foods, fried foods, fast food, processed foods, sodas, red meet and other fattening foods.  - get a least 150-300 minutes of aerobic exercise per week.

## 2015-06-15 ENCOUNTER — Other Ambulatory Visit: Payer: Self-pay

## 2015-06-15 DIAGNOSIS — Z1231 Encounter for screening mammogram for malignant neoplasm of breast: Secondary | ICD-10-CM

## 2015-06-18 ENCOUNTER — Other Ambulatory Visit: Payer: Self-pay | Admitting: Family Medicine

## 2015-07-01 ENCOUNTER — Ambulatory Visit (INDEPENDENT_AMBULATORY_CARE_PROVIDER_SITE_OTHER): Payer: BLUE CROSS/BLUE SHIELD | Admitting: Family Medicine

## 2015-07-01 ENCOUNTER — Encounter: Payer: Self-pay | Admitting: Family Medicine

## 2015-07-01 VITALS — BP 134/84 | HR 68 | Ht 65.0 in | Wt 144.0 lb

## 2015-07-01 DIAGNOSIS — M9908 Segmental and somatic dysfunction of rib cage: Secondary | ICD-10-CM | POA: Diagnosis not present

## 2015-07-01 DIAGNOSIS — M9902 Segmental and somatic dysfunction of thoracic region: Secondary | ICD-10-CM | POA: Diagnosis not present

## 2015-07-01 DIAGNOSIS — M9901 Segmental and somatic dysfunction of cervical region: Secondary | ICD-10-CM

## 2015-07-01 DIAGNOSIS — M5030 Other cervical disc degeneration, unspecified cervical region: Secondary | ICD-10-CM | POA: Diagnosis not present

## 2015-07-01 DIAGNOSIS — M999 Biomechanical lesion, unspecified: Secondary | ICD-10-CM

## 2015-07-01 NOTE — Assessment & Plan Note (Signed)
Patient will like to continue with conservative therapy at this time. We discussed icing regimen, home exercises, which activities to do an which was potentially avoid. We discussed postural changes. Ergonomics at work. Patient continues to respond well to the osteopathic manipulation and will come back again in 6 weeks for further evaluation and treatment.

## 2015-07-01 NOTE — Patient Instructions (Signed)
6-8 weeks Keep it up  

## 2015-07-01 NOTE — Progress Notes (Signed)
Pre visit review using our clinic review tool, if applicable. No additional management support is needed unless otherwise documented below in the visit note. 

## 2015-07-01 NOTE — Progress Notes (Signed)
[name] [address] [place], [place] [address] Phone: [phone] Subjective:     CC: Neck pain follow up.  Right knee pain  [id number] Lindsay [name] is a 65 y.o. female coming in with complaint of neck pain. Patient has a history of moderate arthritis in the neck that was seen on x-rays back in 2011. Patient had repeat x-rays recently and has shown significant progression of the arthritic changes of the neck.   Patient states that overall she seems to be doing relatively well. Patient states and she has been doing exercises a little more and try to work on posture. States that she continues to have a dull throbbing aching sensation at all times of the neck but not anything that seems to stop her from activities. Patient denies any numbness or tingling. Denies any radiation down the arm on a regular basis but it is happening intermittent left greater than right. Patient does not want on doing anything such as an epidural or possibly surgery that she does not want to do any type of advance imaging at this time.  Has been maintaining with osteopathic manipulation as well.   Past Medical History  Diagnosis Date  . COMMON MIGRAINE 09/20/2006  . DEPRESSION 06/11/2009  . GERD 06/11/2009  . Neck pain     eval with sports medicine in 2016/17   Past Surgical History  Procedure Laterality Date  . Cholecystectomy    . Abdominal hysterectomy    . Ovarian cyst removal    . Tonsillectomy    . Repair extensor tendon hand     Social History  Substance Use Topics  . Smoking status: Former Smoker    Types: Cigarettes    Quit date: 04/04/2004  . Smokeless tobacco: Never Used  . Alcohol Use: 0.0 oz/week    0 Standard drinks or equivalent per week     Comment: occ   No Known Allergies Family History  Problem Relation Age of Onset  . Dementia Mother   . Osteoporosis Mother   . Breast cancer Sister   . Heart attack Brother   . Colon cancer Neg Hx   .  Esophageal cancer Neg Hx   . Rectal cancer Neg Hx   . Stomach cancer Neg Hx        Past medical history, social, surgical and family history all reviewed in electronic medical record.   Review of Systems: No headache, visual changes, nausea, vomiting, diarrhea, constipation, dizziness, abdominal pain, skin rash, fevers, chills, night sweats, weight loss, swollen lymph nodes, body aches, joint swelling, muscle aches, chest pain, shortness of breath, mood changes.   Objective Blood pressure 134/84, pulse 68, height 5\' 5"  (1.651 m), weight 144 lb (65.318 kg), SpO2 98 %.  General: No apparent distress alert and oriented x3 mood and affect normal, dressed appropriately.  HEENT: Pupils equal, extraocular movements intact  Respiratory: Patient's speak in full sentences and does not appear short of breath  Cardiovascular: No lower extremity edema, non tender, no erythema  Skin: Warm dry intact with no signs of infection or rash on extremities or on axial skeleton.  Abdomen: Soft nontender  Neuro: Cranial nerves II through XII are intact, neurovascularly intact in all extremities with 2+ DTRs and 2+ pulses.  Lymph: No lymphadenopathy of posterior or anterior cervical chain or axillae bilaterally.  Gait normal with good balance and coordination.  MSK:  Non tender with full range of motion and good stability and symmetric  strength and tone of shoulders, elbows, wrist, hip, knee and ankles bilaterally.  Neck: Inspection unremarkable. No palpable stepoffs. Mild Spurling's maneuver on right side  No significant change from previous exam Still lacking the last 5 of extension and now the last 5 of side bending to the right Grip strength and sensation normal in bilateral hands Strength good C4 to T1 distribution No sensory change to C4 to T1 Negative Hoffman sign bilaterally Reflexes normal No significant change from previous exam   Osteopathic findings C2 flexed rotated and side bent right  w C6 flexed rotated and side bent left T1 extended rotated and side bent left T3 extended rotated and side bent right inhaled third rib T5 extended rotated and side bent left L2 flexed rotated and side bent right    Impression and Recommendations:     This case required medical decision making of moderate complexity.

## 2015-07-01 NOTE — Assessment & Plan Note (Signed)
Decision today to treat with OMT was based on Physical Exam  After verbal consent patient was treated with  ME, FPR techniques in cervical, thoracic and rib areas  Patient tolerated the procedure well with improvement in symptoms  Patient given exercises, stretches and lifestyle modifications  See medications in patient instructions if given  Patient will follow up in 3-6 weeks

## 2015-07-10 ENCOUNTER — Ambulatory Visit
Admission: RE | Admit: 2015-07-10 | Discharge: 2015-07-10 | Disposition: A | Discharge status: Home / Self Care | Payer: BLUE CROSS/BLUE SHIELD | Source: Ambulatory Visit

## 2015-07-10 DIAGNOSIS — Z1231 Encounter for screening mammogram for malignant neoplasm of breast: Secondary | ICD-10-CM

## 2015-07-13 ENCOUNTER — Other Ambulatory Visit: Payer: Self-pay | Admitting: Family Medicine

## 2015-07-13 ENCOUNTER — Ambulatory Visit: Payer: BLUE CROSS/BLUE SHIELD

## 2015-07-13 ENCOUNTER — Other Ambulatory Visit: Payer: Self-pay

## 2015-07-13 DIAGNOSIS — R928 Other abnormal and inconclusive findings on diagnostic imaging of breast: Secondary | ICD-10-CM

## 2015-07-14 ENCOUNTER — Ambulatory Visit
Admission: RE | Admit: 2015-07-14 | Discharge: 2015-07-14 | Disposition: A | Discharge status: Home / Self Care | Payer: BLUE CROSS/BLUE SHIELD | Source: Ambulatory Visit | Attending: Family Medicine | Admitting: Family Medicine

## 2015-07-14 DIAGNOSIS — R928 Other abnormal and inconclusive findings on diagnostic imaging of breast: Secondary | ICD-10-CM

## 2015-08-24 ENCOUNTER — Encounter: Payer: Self-pay | Admitting: Family Medicine

## 2015-08-24 ENCOUNTER — Ambulatory Visit (INDEPENDENT_AMBULATORY_CARE_PROVIDER_SITE_OTHER): Payer: BLUE CROSS/BLUE SHIELD | Admitting: Family Medicine

## 2015-08-24 VITALS — BP 120/82 | HR 67 | Ht 65.0 in | Wt 143.0 lb

## 2015-08-24 DIAGNOSIS — M9901 Segmental and somatic dysfunction of cervical region: Secondary | ICD-10-CM | POA: Diagnosis not present

## 2015-08-24 DIAGNOSIS — M5030 Other cervical disc degeneration, unspecified cervical region: Secondary | ICD-10-CM | POA: Diagnosis not present

## 2015-08-24 DIAGNOSIS — M999 Biomechanical lesion, unspecified: Secondary | ICD-10-CM

## 2015-08-24 DIAGNOSIS — M9902 Segmental and somatic dysfunction of thoracic region: Secondary | ICD-10-CM | POA: Diagnosis not present

## 2015-08-24 DIAGNOSIS — M9908 Segmental and somatic dysfunction of rib cage: Secondary | ICD-10-CM | POA: Diagnosis not present

## 2015-08-24 NOTE — Assessment & Plan Note (Signed)
She does have severe degenerative disc disease. Continues to have some mild radicular symptoms. Patient does not want any aggressive therapy at the moment. Continues respond fairly well to osteopathic epilation. We discussed posture and core strengthening exercises. Patient will come back and see me again in 6-8 weeks for further evaluation. No change in medications today.

## 2015-08-24 NOTE — Patient Instructions (Signed)
Great to see you as always  Lindsay [name] is your friend ( you knew that already) Continue the gabapentin See me again in 6-8 weeks.

## 2015-08-24 NOTE — Progress Notes (Signed)
Pre visit review using our clinic review tool, if applicable. No additional management support is needed unless otherwise documented below in the visit note. 

## 2015-08-24 NOTE — Assessment & Plan Note (Signed)
Decision today to treat with OMT was based on Physical Exam  After verbal consent patient was treated with  ME, FPR techniques in cervical, thoracic and rib areas  Patient tolerated the procedure well with improvement in symptoms  Patient given exercises, stretches and lifestyle modifications  See medications in patient instructions if given  Patient will follow up in 6-8 weeks

## 2015-08-24 NOTE — Progress Notes (Signed)
[name] [place] [place], [place] [address] Phone: [phone] Subjective:     CC: Neck pain follow up.  Right knee pain f/u  [id number] Lindsay [name] is a 65 y.o. female coming in with complaint of neck pain. Patient has a history of moderate arthritis in the neck that was seen on x-rays back in 2011. Patient had repeat x-rays recently and has shown significant progression of the arthritic changes of the neck.  Patient was continuing to have intermittent radicular symptoms and was started on the low dose gabapentin. Has been doing conservative therapy. Has responded fairly well to osteopathic manipulation. Patient states overall doing well. Not having as much pain as usual. States that the radiation to her fingertips is improving as well.   States that the right knee seems to be doing well. Not having any pain.    Past Medical History  Diagnosis Date  . COMMON MIGRAINE 09/20/2006  . DEPRESSION 06/11/2009  . GERD 06/11/2009  . Neck pain     eval with sports medicine in 2016/17   Past Surgical History  Procedure Laterality Date  . Cholecystectomy    . Abdominal hysterectomy    . Ovarian cyst removal    . Tonsillectomy    . Repair extensor tendon hand     Social History  Substance Use Topics  . Smoking status: Former Smoker    Types: Cigarettes    Quit date: 04/04/2004  . Smokeless tobacco: Never Used  . Alcohol Use: 0.0 oz/week    0 Standard drinks or equivalent per week     Comment: occ   No Known Allergies Family History  Problem Relation Age of Onset  . Dementia Mother   . Osteoporosis Mother   . Breast cancer Sister   . Heart attack Brother   . Colon cancer Neg Hx   . Esophageal cancer Neg Hx   . Rectal cancer Neg Hx   . Stomach cancer Neg Hx        Past medical history, social, surgical and family history all reviewed in electronic medical record.   Review of Systems: No headache, visual changes, nausea,  vomiting, diarrhea, constipation, dizziness, abdominal pain, skin rash, fevers, chills, night sweats, weight loss, swollen lymph nodes, body aches, joint swelling, muscle aches, chest pain, shortness of breath, mood changes.   Objective Blood pressure 120/82, pulse 67, height 5\' 5"  (1.651 m), weight 143 lb (64.864 kg), SpO2 99 %.  General: No apparent distress alert and oriented x3 mood and affect normal, dressed appropriately.  HEENT: Pupils equal, extraocular movements intact  Respiratory: Patient's speak in full sentences and does not appear short of breath  Cardiovascular: No lower extremity edema, non tender, no erythema  Skin: Warm dry intact with no signs of infection or rash on extremities or on axial skeleton.  Abdomen: Soft nontender  Neuro: Cranial nerves II through XII are intact, neurovascularly intact in all extremities with 2+ DTRs and 2+ pulses.  Lymph: No lymphadenopathy of posterior or anterior cervical chain or axillae bilaterally.  Gait normal with good balance and coordination.  MSK:  Non tender with full range of motion and good stability and symmetric strength and tone of shoulders, elbows, wrist, hip, knee and ankles bilaterally.  Neck: Inspection unremarkable. No palpable stepoffs. Continue mild Spurling's  No significant change from previous exam Still lacking the last 5 of extension and now the last 5 of side bending to the right Grip strength and  sensation normal in bilateral hands Strength good C4 to T1 distribution No sensory change to C4 to T1 Negative Hoffman sign bilaterally Reflexes normal Stable.   Osteopathic findings C2 flexed rotated and side bent right  C6 flexed rotated and side bent left T3 extended rotated and side bent right inhaled third rib T5 extended rotated and side bent left L2 flexed rotated and side bent right     Impression and Recommendations:     This case required medical decision making of moderate complexity.

## 2015-10-20 ENCOUNTER — Encounter: Payer: Self-pay | Admitting: Family Medicine

## 2015-10-20 ENCOUNTER — Ambulatory Visit (INDEPENDENT_AMBULATORY_CARE_PROVIDER_SITE_OTHER): Payer: BLUE CROSS/BLUE SHIELD | Admitting: Family Medicine

## 2015-10-20 VITALS — BP 118/76 | HR 68 | Wt 140.0 lb

## 2015-10-20 DIAGNOSIS — M9901 Segmental and somatic dysfunction of cervical region: Secondary | ICD-10-CM

## 2015-10-20 DIAGNOSIS — M9908 Segmental and somatic dysfunction of rib cage: Secondary | ICD-10-CM

## 2015-10-20 DIAGNOSIS — M5030 Other cervical disc degeneration, unspecified cervical region: Secondary | ICD-10-CM | POA: Diagnosis not present

## 2015-10-20 DIAGNOSIS — M9902 Segmental and somatic dysfunction of thoracic region: Secondary | ICD-10-CM

## 2015-10-20 DIAGNOSIS — M999 Biomechanical lesion, unspecified: Secondary | ICD-10-CM

## 2015-10-20 NOTE — Assessment & Plan Note (Signed)
Decision today to treat with OMT was based on Physical Exam  After verbal consent patient was treated with  ME, FPR techniques in cervical, thoracic and rib areas  Patient tolerated the procedure well with improvement in symptoms  Patient given exercises, stretches and lifestyle modifications  See medications in patient instructions if given  Patient will follow up in 5-6 weeks

## 2015-10-20 NOTE — Assessment & Plan Note (Signed)
Continues to have some radicular symptoms. Encourage her to potentially have the advance imaging as well as epidurals. Patient declined again. Once to continue with conservative therapy. We will continue to monitor. Continues respond somewhat to osteopathic manipulation. Decline formal physical therapy. We'll see patient again in 5-6 weeks.

## 2015-10-20 NOTE — Progress Notes (Signed)
[name] [address] [place], [place] [address] Phone: [phone] Subjective:     CC: Neck pain follow up.  Right knee pain f/u  [id number] Lindsay [name] is a 65 y.o. female coming in with complaint of neck pain. Patient has a history of moderate arthritis in the neck that was seen on x-rays back in 2011. Patient had repeat x-rays recently and has shown significant progression of the arthritic changes of the neck.  Patient was continuing to have intermittent radicular symptoms and was started on the low dose gabapentin. States that she still has some mild numbness on the right side of her shoulder. Patient states it is not going to her hand much. Some intermittent pain and numbness in the left thumb but states that this is very minimal. Increasing tightness of the neck recently. Not doing exercises as regularly.   Past Medical History  Diagnosis Date  . COMMON MIGRAINE 09/20/2006  . DEPRESSION 06/11/2009  . GERD 06/11/2009  . Neck pain     eval with sports medicine in 2016/17   Past Surgical History  Procedure Laterality Date  . Cholecystectomy    . Abdominal hysterectomy    . Ovarian cyst removal    . Tonsillectomy    . Repair extensor tendon hand     Social History  Substance Use Topics  . Smoking status: Former Smoker    Types: Cigarettes    Quit date: 04/04/2004  . Smokeless tobacco: Never Used  . Alcohol Use: 0.0 oz/week    0 Standard drinks or equivalent per week     Comment: occ   No Known Allergies Family History  Problem Relation Age of Onset  . Dementia Mother   . Osteoporosis Mother   . Breast cancer Sister   . Heart attack Brother   . Colon cancer Neg Hx   . Esophageal cancer Neg Hx   . Rectal cancer Neg Hx   . Stomach cancer Neg Hx        Past medical history, social, surgical and family history all reviewed in electronic medical record.   Review of Systems: No headache, visual changes, nausea, vomiting,  diarrhea, constipation, dizziness, abdominal pain, skin rash, fevers, chills, night sweats, weight loss, swollen lymph nodes, body aches, joint swelling, muscle aches, chest pain, shortness of breath, mood changes.   Objective Blood pressure 118/76, pulse 68, weight 140 lb (63.504 kg).  General: No apparent distress alert and oriented x3 mood and affect normal, dressed appropriately.  HEENT: Pupils equal, extraocular movements intact  Respiratory: Patient's speak in full sentences and does not appear short of breath  Cardiovascular: No lower extremity edema, non tender, no erythema  Skin: Warm dry intact with no signs of infection or rash on extremities or on axial skeleton.  Abdomen: Soft nontender  Neuro: Cranial nerves II through XII are intact, neurovascularly intact in all extremities with 2+ DTRs and 2+ pulses.  Lymph: No lymphadenopathy of posterior or anterior cervical chain or axillae bilaterally.  Gait normal with good balance and coordination.  MSK:  Non tender with full range of motion and good stability and symmetric strength and tone of shoulders, elbows, wrist, hip, knee and ankles bilaterally.  Neck: Inspection unremarkable. No palpable stepoffs. Continue mild Spurling's  only on the right side Still lacking the last 5 of extension and now the last 5 of side bending to the right no worsening of previous exam Grip strength and sensation normal in bilateral  hands Strength good C4 to T1 distribution No sensory change to C4 to T1 Negative Hoffman sign bilaterally Reflexes normal Stable.   Osteopathic findings C2 flexed rotated and side bent right  C4 flexed rotated and side bent left T5 extended rotated and side bent right inhaled third rib T7 extended rotated and side bent left L2 flexed rotated and side bent right     Impression and Recommendations:     This case required medical decision making of moderate complexity.

## 2015-10-20 NOTE — Patient Instructions (Signed)
Keep working at it Watch the numbness See me again in 6 weeks

## 2015-10-26 ENCOUNTER — Ambulatory Visit (INDEPENDENT_AMBULATORY_CARE_PROVIDER_SITE_OTHER): Payer: BLUE CROSS/BLUE SHIELD | Admitting: Family Medicine

## 2015-10-26 ENCOUNTER — Encounter: Payer: Self-pay | Admitting: Family Medicine

## 2015-10-26 VITALS — BP 118/82 | HR 84 | Temp 97.8°F | Ht 65.0 in | Wt 142.1 lb

## 2015-10-26 DIAGNOSIS — G43009 Migraine without aura, not intractable, without status migrainosus: Secondary | ICD-10-CM | POA: Diagnosis not present

## 2015-10-26 DIAGNOSIS — R198 Other specified symptoms and signs involving the digestive system and abdomen: Secondary | ICD-10-CM

## 2015-10-26 DIAGNOSIS — F458 Other somatoform disorders: Secondary | ICD-10-CM | POA: Diagnosis not present

## 2015-10-26 DIAGNOSIS — K219 Gastro-esophageal reflux disease without esophagitis: Secondary | ICD-10-CM

## 2015-10-26 DIAGNOSIS — R21 Rash and other nonspecific skin eruption: Secondary | ICD-10-CM

## 2015-10-26 DIAGNOSIS — R0989 Other specified symptoms and signs involving the circulatory and respiratory systems: Secondary | ICD-10-CM

## 2015-10-26 MED ORDER — SUMATRIPTAN SUCCINATE 100 MG PO TABS: ORAL_TABLET | ORAL | 2 refills | Status: DC

## 2015-10-26 MED ORDER — BUPROPION HCL ER (SR) 150 MG PO TB12: 150.0000 mg | ORAL_TABLET | Freq: Two times a day (BID) | ORAL | 3 refills | Status: DC

## 2015-10-26 MED ORDER — TRETINOIN (EMOLLIENT) 0.05 % EX CREA: TOPICAL_CREAM | CUTANEOUS | 1 refills | Status: DC

## 2015-10-26 NOTE — Patient Instructions (Signed)
BEFORE YOU LEAVE: -follow up: physical in March 2018  For the throat sensation: Flonase nasal spray 2 sprays each nostril daily for 3 weeks Take you acid reducer daily for 1 week Call in 2 weeks if symptoms persist, or if the are recurrent later on.  We recommend the following healthy lifestyle: 1) Small portions - eat off of salad plate instead of dinner plate 2) Eat a healthy clean diet with avoidance of (less then 1 serving per week) processed foods, sweetened drinks, white starches, red meat, fast foods and sweets and consisting of: * 5-9 servings per day of fresh or frozen fruits and vegetables (not corn or potatoes, not dried or canned) *nuts and seeds, beans *olives and olive oil *small portions of lean meats such as fish and white chicken  *small portions of whole grains 3)Get at least 150 minutes of sweaty aerobic exercise per week 4)reduce stress - counseling, meditation, relaxation to balance other aspects of your life

## 2015-10-26 NOTE — Progress Notes (Signed)
HPI:  GERD: -uses PPI sometimes for chronic GERD - has not used in sometime -rare dysphagia, refused GI evaluation in the past, unchanged - occ globus sensation or sensation of something stuck in lower esophagus -denies: choking, weight loss, nausea, vomiting -hx dilation of esophagus remotely  Migraines: -Uses prn triptan, requests refill preinted  Depression and stress: Chronic depression, treated with Wellbutrin - requests printed refill Increased family and work stress recently with increased irritability No significant anxiety, panic disorder, manic symptoms, thoughts of self-harm, worsening depressed mood or psychosis.  Mild hyperlipidemia: -No regular exercise -Reports a healthy diet  ROS: See pertinent positives and negatives per HPI.  Past Medical History:  Diagnosis Date  . COMMON MIGRAINE 09/20/2006  . DEPRESSION 06/11/2009  . GERD 06/11/2009  . Neck pain    eval with sports medicine in 2016/17    Past Surgical History:  Procedure Laterality Date  . ABDOMINAL HYSTERECTOMY    . CHOLECYSTECTOMY    . OVARIAN CYST REMOVAL    . REPAIR EXTENSOR TENDON HAND    . TONSILLECTOMY      Family History  Problem Relation Age of Onset  . Dementia Mother   . Osteoporosis Mother   . Breast cancer Sister   . Heart attack Brother   . Colon cancer Neg Hx   . Esophageal cancer Neg Hx   . Rectal cancer Neg Hx   . Stomach cancer Neg Hx     Social History   Social History  . Marital status: Married    Spouse name: N/A  . Number of children: N/A  . Years of education: N/A   Social History Main Topics  . Smoking status: Former Smoker    Types: Cigarettes    Quit date: 04/04/2004  . Smokeless tobacco: Never Used  . Alcohol use 0.0 oz/week     Comment: occ  . Drug use: No  . Sexual activity: Not Asked   Other Topics Concern  . None   Social History Narrative  . None     Current Outpatient Prescriptions:  .  aspirin 81 MG tablet, Take 81 mg by mouth daily.   , Disp: , Rfl:  .  B Complex Vitamins (VITAMIN B-COMPLEX PO), Take by mouth., Disp: , Rfl:  .  buPROPion (WELLBUTRIN SR) 150 MG 12 hr tablet, Take 1 tablet (150 mg total) by mouth 2 (two) times daily., Disp: 120 tablet, Rfl: 3 .  Calcium Carbonate-Vit D-Min (CALCIUM 1200 PO), Take 1 tablet by mouth daily.  , Disp: , Rfl:  .  Cholecalciferol (VITAMIN D3) 1000 UNITS capsule, Take 1,000 Units by mouth daily.  , Disp: , Rfl:  .  diphenhydramine-acetaminophen (TYLENOL PM) 25-500 MG TABS, Take 1 tablet by mouth at bedtime as needed., Disp: , Rfl:  .  Docusate Sodium (COLACE PO), Take by mouth., Disp: , Rfl:  .  fish oil-omega-3 fatty acids 1000 MG capsule, Take 1 g by mouth daily., Disp: , Rfl:  .  gabapentin (NEURONTIN) 100 MG capsule, Take 2 capsules (200 mg total) by mouth at bedtime., Disp: 180 capsule, Rfl: 1 .  MAGNESIUM PO, Take by mouth., Disp: , Rfl:  .  niacin 250 MG tablet, Take 250 mg by mouth daily with breakfast.  , Disp: , Rfl:  .  pantoprazole (PROTONIX) 40 MG tablet, Take 1 tablet (40 mg total) by mouth daily., Disp: 30 tablet, Rfl: 5 .  SUMAtriptan (IMITREX) 100 MG tablet, Take 1/2-1 tablet at the first signs of a migraine. May  repeat once in 2 hours if needed. Do not take more than 2 doses in 24 hours., Disp: 9 tablet, Rfl: 2 .  Tretinoin, Facial Wrinkles, (TRETINOIN, EMOLLIENT,) 0.05 % CREA, Apply a small amount to affected area nightly, Disp: 20 g, Rfl: 1 .  vitamin E 400 UNIT capsule, Take 400 Units by mouth daily.  , Disp: , Rfl:   EXAM:  Vitals:   10/26/15 1603  BP: 118/82  Pulse: 84  Temp: 97.8 F (36.6 C)    Body mass index is 23.65 kg/m.  GENERAL: vitals reviewed and listed above, alert, oriented, appears well hydrated and in no acute distress  HEENT: atraumatic, conjunttiva clear, no obvious abnormalities on inspection of external nose and ears, normal appearance of ear canals and TMs, clear nasal congestion, mild post oropharyngeal erythema with PND, no tonsillar  edema or exudate, no sinus TTP  NECK: no obvious masses on inspection  LUNGS: clear to auscultation bilaterally, no wheezes, rales or rhonchi, good air movement  CV: HRRR, no peripheral edema  MS: moves all extremities without noticeable abnormality  PSYCH: pleasant and cooperative, no obvious depression or anxiety  ASSESSMENT AND PLAN:  Discussed the following assessment and plan:  Gastroesophageal reflux disease, esophagitis presence not specified -cont prn ppi, but advised 1 week course to see if this helps the glubus sensation and if so, may need lifestyle change sor low dose PPI more regularly -advised of risks and if persistent dysphagia or globus sensation advised GI evaluation, she refused today, but agreed to call if persistent symptoms  Migraine without aura and without status migrainosus, not intractable - Plan: SUMAtriptan (IMITREX) 100 MG tablet -refilled medication  Rash and nonspecific skin eruption - Plan: Tretinoin, Facial Wrinkles, (TRETINOIN, EMOLLIENT,) 0.05 % CREA -refilled medication  Globus sensation -we discussed possible serious and likely etiologies, workup and treatment, treatment risks and return precautions -after this discussion, Lindsay [name] opted for trial INS and short course PPI, lifestyle changes -follow up advised in 2 weeks if symptoms persist -of course, we advised Lindsay [name]  to return or notify a doctor immediately if symptoms worsen or persist or new concerns arise.  -Patient advised to return or notify a doctor immediately if symptoms worsen or persist or new concerns arise.  Patient Instructions  BEFORE YOU LEAVE: -follow up: physical in March 2018  For the throat sensation: Flonase nasal spray 2 sprays each nostril daily for 3 weeks Take you acid reducer daily for 1 week Call in 2 weeks if symptoms persist, or if the are recurrent later on.  We recommend the following healthy lifestyle: 1) Small portions - eat off of salad plate instead of  dinner plate 2) Eat a healthy clean diet with avoidance of (less then 1 serving per week) processed foods, sweetened drinks, white starches, red meat, fast foods and sweets and consisting of: * 5-9 servings per day of fresh or frozen fruits and vegetables (not corn or potatoes, not dried or canned) *nuts and seeds, beans *olives and olive oil *small portions of lean meats such as fish and white chicken  *small portions of whole grains 3)Get at least 150 minutes of sweaty aerobic exercise per week 4)reduce stress - counseling, meditation, relaxation to balance other aspects of your life    Colin Benton R., DO

## 2015-10-26 NOTE — Progress Notes (Signed)
Pre visit review using our clinic review tool, if applicable. No additional management support is needed unless otherwise documented below in the visit note. 

## 2015-11-30 NOTE — Assessment & Plan Note (Addendum)
Decision today to treat with OMT was based on Physical Exam  After verbal consent patient was treated with  ME, FPR techniques in cervical, thoracic and rib areas, avoided HVLA in the cervical spine.  Patient tolerated the procedure well with improvement in symptoms  Patient given exercises, stretches and lifestyle modifications  See medications in patient instructions if given  Patient will follow up in 5-6 weeks

## 2015-11-30 NOTE — Assessment & Plan Note (Deleted)
Overall doing relatively well. Patient's continues to be fairly active. Patient denies any worsening symptoms at this moment. If worsening radicular symptoms we have discussed previously that advance imaging is likely epidurals will be necessary.

## 2015-11-30 NOTE — Progress Notes (Signed)
[name] [place], [place] [address] Phone: [phone] Subjective:     CC: Neck pain follow up.  Right knee pain f/u  [id number]  Lindsay [name] is a 65 y.o. female coming in with complaint of neck pain. Patient has a history of moderate arthritis in the neck that was seen on x-rays back in 2011. Patient had repeat x-rays recently and has shown significant progression of the arthritic changes of the neck.  Patient unable to tolerate the gabapentin at this time secondary to GI distress. States that she still has some mild numbness on the right side of her shoulder. Continues to have intermittent numbness. An states that it seems to be starting to affect her work. Symptomatic it very difficult to follow sleep at night. Affecting activities of daily living as well. Not doing as much gardening and she should be doing she states.   Past Medical History:  Diagnosis Date  . COMMON MIGRAINE 09/20/2006  . DEPRESSION 06/11/2009  . GERD 06/11/2009  . Neck pain    eval with sports medicine in 2016/17   Past Surgical History:  Procedure Laterality Date  . ABDOMINAL HYSTERECTOMY    . CHOLECYSTECTOMY    . OVARIAN CYST REMOVAL    . REPAIR EXTENSOR TENDON HAND    . TONSILLECTOMY     Social History  Substance Use Topics  . Smoking status: Former Smoker    Types: Cigarettes    Quit date: 04/04/2004  . Smokeless tobacco: Never Used  . Alcohol use 0.0 oz/week     Comment: occ   No Known Allergies Family History  Problem Relation Age of Onset  . Dementia Mother   . Osteoporosis Mother   . Breast cancer Sister   . Heart attack Brother   . Colon cancer Neg Hx   . Esophageal cancer Neg Hx   . Rectal cancer Neg Hx   . Stomach cancer Neg Hx        Past medical history, social, surgical and family history all reviewed in electronic medical record.   Review of Systems: No headache, visual changes, nausea, vomiting, diarrhea, constipation,  dizziness, abdominal pain, skin rash, fevers, chills, night sweats, weight loss, swollen lymph nodes, body aches, joint swelling, muscle aches, chest pain, shortness of breath, mood changes.   Objective  Blood pressure 120/82, pulse 72, weight 142 lb (64.4 kg), SpO2 97 %.  General: No apparent distress alert and oriented x3 mood and affect normal, dressed appropriately.  HEENT: Pupils equal, extraocular movements intact  Respiratory: Patient's speak in full sentences and does not appear short of breath  Cardiovascular: No lower extremity edema, non tender, no erythema  Skin: Warm dry intact with no signs of infection or rash on extremities or on axial skeleton.  Abdomen: Soft nontender  Neuro: Cranial nerves II through XII are intact, neurovascularly intact in all extremities with 2+ DTRs and 2+ pulses.  Lymph: No lymphadenopathy of posterior or anterior cervical chain or axillae bilaterally.  Gait normal with good balance and coordination.  MSK:  Non tender with full range of motion and good stability and symmetric strength and tone of shoulders, elbows, wrist, hip, knee and ankles bilaterally.  Neck: Inspection unremarkable. No palpable stepoffs. Continue Spurling's  only on the right side With worsening symptoms Increased loss of motion lacking the last 10 of extension with worsening radicular symptoms Grip strength and sensation normal in bilateral hands Strength good C4 to T1 distribution No  sensory change to C4 to T1 Negative Hoffman sign bilaterally Reflexes normal Stable.   Osteopathic findings C2 flexed rotated and side bent right  C4 flexed rotated and side bent left T5 extended rotated and side bent right inhaled third rib T7 extended rotated and side bent left L2 flexed rotated and side bent right     Impression and Recommendations:     This case required medical decision making of moderate complexity.

## 2015-12-01 ENCOUNTER — Ambulatory Visit (INDEPENDENT_AMBULATORY_CARE_PROVIDER_SITE_OTHER): Payer: BLUE CROSS/BLUE SHIELD | Admitting: Family Medicine

## 2015-12-01 ENCOUNTER — Other Ambulatory Visit: Payer: Self-pay | Admitting: *Deleted

## 2015-12-01 ENCOUNTER — Encounter: Payer: Self-pay | Admitting: Family Medicine

## 2015-12-01 VITALS — BP 120/82 | HR 72 | Wt 142.0 lb

## 2015-12-01 DIAGNOSIS — M9901 Segmental and somatic dysfunction of cervical region: Secondary | ICD-10-CM | POA: Diagnosis not present

## 2015-12-01 DIAGNOSIS — M5030 Other cervical disc degeneration, unspecified cervical region: Secondary | ICD-10-CM

## 2015-12-01 DIAGNOSIS — M542 Cervicalgia: Secondary | ICD-10-CM

## 2015-12-01 DIAGNOSIS — M9908 Segmental and somatic dysfunction of rib cage: Secondary | ICD-10-CM | POA: Diagnosis not present

## 2015-12-01 DIAGNOSIS — M9902 Segmental and somatic dysfunction of thoracic region: Secondary | ICD-10-CM | POA: Diagnosis not present

## 2015-12-01 DIAGNOSIS — M999 Biomechanical lesion, unspecified: Secondary | ICD-10-CM

## 2015-12-01 MED ORDER — NORTRIPTYLINE HCL 25 MG PO CAPS: 25.0000 mg | ORAL_CAPSULE | Freq: Every day | ORAL | 1 refills | Status: DC

## 2015-12-01 NOTE — Assessment & Plan Note (Signed)
Discussed with patient at great length. Patient seems to be having worsening symptoms at this time. Radicular symptoms seem to be worsening as well. Affecting daily activities. I do feel that advance imaging would be warranted. MRI of the cervical spine ordered today. We discussed icing regimen, we discontinued the gabapentin and started on nortriptyline. Patient continues to respond fairly well to osteopathic manipulation and will come back and see me again in 4-6 weeks.

## 2015-12-01 NOTE — Patient Instructions (Signed)
We will get MRi Keep watching the posture Nortriptyline 25 mg at night, if too strong try 1/2 tab See me again in 4-6 weeks but we will be pen-pals earlier.

## 2015-12-02 ENCOUNTER — Encounter: Payer: Self-pay | Admitting: Family Medicine

## 2015-12-09 ENCOUNTER — Encounter: Payer: Self-pay | Admitting: Family Medicine

## 2015-12-09 DIAGNOSIS — M5030 Other cervical disc degeneration, unspecified cervical region: Secondary | ICD-10-CM

## 2015-12-10 ENCOUNTER — Other Ambulatory Visit: Payer: BLUE CROSS/BLUE SHIELD

## 2015-12-16 ENCOUNTER — Encounter: Payer: Self-pay | Admitting: *Deleted

## 2015-12-19 ENCOUNTER — Other Ambulatory Visit: Payer: Self-pay | Admitting: Family Medicine

## 2016-01-04 NOTE — Progress Notes (Signed)
[name] [place], [place] [address] Phone: [phone] Subjective:     CC: Neck pain follow up.  Right knee pain f/u  [id number]  Lindsay [name] is a 65 y.o. female coming in with complaint of neck pain. Patient has a history of moderate arthritis in the neck And then on more recent x-rays have shown now severe osteophytic changes. Continued have pain as well as radicular symptoms and MRI was ordered. MRI independently visualized by me showing severe central canal stenosis at C5-C6 with moderate to severe bilateral foraminal stenosis. Significant arthritic and foraminal stenosis at multiple other levels as well. Patient is scheduled to have an epidural. Patient states she is concerned but is unfortunately not making any improvement. Continues to have radicular symptoms. Going to mostly her fingertips at this moment. Denies any weakness.   Past Medical History:  Diagnosis Date  . COMMON MIGRAINE 09/20/2006  . DEPRESSION 06/11/2009  . GERD 06/11/2009  . Neck pain    eval with sports medicine in 2016/17   Past Surgical History:  Procedure Laterality Date  . ABDOMINAL HYSTERECTOMY    . CHOLECYSTECTOMY    . OVARIAN CYST REMOVAL    . REPAIR EXTENSOR TENDON HAND    . TONSILLECTOMY     Social History  Substance Use Topics  . Smoking status: Former Smoker    Types: Cigarettes    Quit date: 04/04/2004  . Smokeless tobacco: Never Used  . Alcohol use 0.0 oz/week     Comment: occ   No Known Allergies Family History  Problem Relation Age of Onset  . Dementia Mother   . Osteoporosis Mother   . Breast cancer Sister   . Heart attack Brother   . Colon cancer Neg Hx   . Esophageal cancer Neg Hx   . Rectal cancer Neg Hx   . Stomach cancer Neg Hx        Past medical history, social, surgical and family history all reviewed in electronic medical record.   Review of Systems: No headache, visual changes, nausea, vomiting, diarrhea,  constipation, dizziness, abdominal pain, skin rash, fevers, chills, night sweats, weight loss, swollen lymph nodes, body aches, joint swelling, muscle aches, chest pain, shortness of breath, mood changes.   Objective  Blood pressure 130/82, pulse 72, weight 143 lb (64.9 kg).  General: No apparent distress alert and oriented x3 mood and affect normal, dressed appropriately.  HEENT: Pupils equal, extraocular movements intact  Respiratory: Patient's speak in full sentences and does not appear short of breath  Cardiovascular: No lower extremity edema, non tender, no erythema  Skin: Warm dry intact with no signs of infection or rash on extremities or on axial skeleton.  Abdomen: Soft nontender  Neuro: Cranial nerves II through XII are intact, neurovascularly intact in all extremities with 2+ DTRs and 2+ pulses.  Lymph: No lymphadenopathy of posterior or anterior cervical chain or axillae bilaterally.  Gait normal with good balance and coordination.  MSK:  Non tender with full range of motion and good stability and symmetric strength and tone of shoulders, elbows, wrist, hip, knee and ankles bilaterally.  Neck: Inspection unremarkable. No palpable stepoffs. Continue Spurling's  only on the right side  Increased loss of motion lacking the last 10 of extension with worsening radicular symptoms Especially with this side bending component Grip strength and sensation normal in bilateral hands Strength good C4 to T1 distribution No sensory change to C4 to T1 Negative Hoffman sign  bilaterally Reflexes normal Stable.   Osteopathic findings C2 flexed rotated and side bent right  C6 flexed rotated and side bent left T5 extended rotated and side bent right inhaled third rib T10 extended rotated and side bent left L2 flexed rotated and side bent right     Impression and Recommendations:     This case required medical decision making of moderate complexity.

## 2016-01-05 ENCOUNTER — Encounter: Payer: Self-pay | Admitting: Family Medicine

## 2016-01-05 ENCOUNTER — Ambulatory Visit (INDEPENDENT_AMBULATORY_CARE_PROVIDER_SITE_OTHER): Payer: BLUE CROSS/BLUE SHIELD | Admitting: Family Medicine

## 2016-01-05 VITALS — BP 130/82 | HR 72 | Wt 143.0 lb

## 2016-01-05 DIAGNOSIS — M999 Biomechanical lesion, unspecified: Secondary | ICD-10-CM

## 2016-01-05 DIAGNOSIS — M5030 Other cervical disc degeneration, unspecified cervical region: Secondary | ICD-10-CM

## 2016-01-05 NOTE — Assessment & Plan Note (Addendum)
Decision today to treat with OMT was based on Physical Exam  After verbal consent patient was treated with  ME, FPR techniques in cervical, thoracic and rib areas, avoided HVLA in the cervical spine.  Patient tolerated the procedure well with improvement in symptoms  Patient given exercises, stretches and lifestyle modifications  See medications in patient instructions if given  Patient will follow up in 4-6 weeks

## 2016-01-05 NOTE — Assessment & Plan Note (Signed)
Continues to have some radicular symptoms. We discussed with patient at great length. Patient will follow through with the epidural. I do think that this will be beneficial as well as diagnostic. We discussed continuing the same medications. Continue posture and avoid heavy lifting.  F/u in 4 weeks.

## 2016-01-05 NOTE — Patient Instructions (Addendum)
good to see you  Ice is your friend Make him take you to [facility] See me again in 4 weeks.

## 2016-01-08 ENCOUNTER — Ambulatory Visit
Admission: RE | Admit: 2016-01-08 | Discharge: 2016-01-08 | Disposition: A | Discharge status: Home / Self Care | Payer: BLUE CROSS/BLUE SHIELD | Source: Ambulatory Visit | Attending: Family Medicine | Admitting: Family Medicine

## 2016-01-08 DIAGNOSIS — M5030 Other cervical disc degeneration, unspecified cervical region: Secondary | ICD-10-CM

## 2016-01-08 MED ORDER — IOPAMIDOL (ISOVUE-M 300) INJECTION 61%
1.0000 mL | Freq: Once | INTRAMUSCULAR | Status: AC | PRN
  Administered 2016-01-08: 1 mL via EPIDURAL

## 2016-01-08 MED ORDER — TRIAMCINOLONE ACETONIDE 40 MG/ML IJ SUSP (RADIOLOGY)
60.0000 mg | Freq: Once | INTRAMUSCULAR | Status: AC
  Administered 2016-01-08: 60 mg via EPIDURAL

## 2016-01-08 NOTE — Discharge Instructions (Signed)

## 2016-01-21 ENCOUNTER — Other Ambulatory Visit: Payer: Self-pay | Admitting: Family Medicine

## 2016-01-21 NOTE — Telephone Encounter (Signed)
Refill done.  

## 2016-01-28 ENCOUNTER — Other Ambulatory Visit: Payer: Self-pay | Admitting: Family Medicine

## 2016-01-28 DIAGNOSIS — N6310 Unspecified lump in the right breast, unspecified quadrant: Secondary | ICD-10-CM

## 2016-01-28 DIAGNOSIS — N6489 Other specified disorders of breast: Secondary | ICD-10-CM

## 2016-02-02 ENCOUNTER — Encounter: Payer: Self-pay | Admitting: Family Medicine

## 2016-02-02 ENCOUNTER — Ambulatory Visit (INDEPENDENT_AMBULATORY_CARE_PROVIDER_SITE_OTHER): Payer: BLUE CROSS/BLUE SHIELD | Admitting: Family Medicine

## 2016-02-02 VITALS — BP 130/82 | HR 75 | Ht 65.0 in | Wt 144.0 lb

## 2016-02-02 DIAGNOSIS — M5030 Other cervical disc degeneration, unspecified cervical region: Secondary | ICD-10-CM | POA: Diagnosis not present

## 2016-02-02 DIAGNOSIS — M999 Biomechanical lesion, unspecified: Secondary | ICD-10-CM | POA: Diagnosis not present

## 2016-02-02 NOTE — Patient Instructions (Signed)
6 weeks.  Ice is your friend I am so happy

## 2016-02-02 NOTE — Assessment & Plan Note (Signed)
Decision today to treat with OMT was based on Physical Exam  After verbal consent patient was treated with  ME, FPR techniques in cervical, thoracic and rib areas, avoided HVLA in the cervical spine.  Patient tolerated the procedure well with improvement in symptoms  Patient given exercises, stretches and lifestyle modifications  See medications in patient instructions if given  Patient will follow up in 6 weeks  Reiterated continue conservative therapy.

## 2016-02-02 NOTE — Progress Notes (Signed)
[name] [place], [place] [address] Phone: [phone] Subjective:     CC: Neck pain follow up.  Right knee pain f/u  [id number]  Lindsay [name] is a 65 y.o. female coming in with complaint of neck pain. Patient has a history of moderate arthritis in the neck And then on more recent x-rays have shown now severe osteophytic changes. Continued have pain as well as radicular symptoms and MRI was ordered. MRI independently visualized by me showing severe central canal stenosis at C5-C6 with moderate to severe bilateral foraminal stenosis. Significant arthritic and foraminal stenosis at multiple other levels as well. Was given epidural 10/6- doing much better less severe and not as much numbness.  Feels more strength as well.       Past Medical History:  Diagnosis Date  . COMMON MIGRAINE 09/20/2006  . DEPRESSION 06/11/2009  . GERD 06/11/2009  . Neck pain    eval with sports medicine in 2016/17   Past Surgical History:  Procedure Laterality Date  . ABDOMINAL HYSTERECTOMY    . CHOLECYSTECTOMY    . OVARIAN CYST REMOVAL    . REPAIR EXTENSOR TENDON HAND    . TONSILLECTOMY     Social History  Substance Use Topics  . Smoking status: Former Smoker    Types: Cigarettes    Quit date: 04/04/2004  . Smokeless tobacco: Never Used  . Alcohol use 0.0 oz/week     Comment: occ   No Known Allergies Family History  Problem Relation Age of Onset  . Dementia Mother   . Osteoporosis Mother   . Breast cancer Sister   . Heart attack Brother   . Colon cancer Neg Hx   . Esophageal cancer Neg Hx   . Rectal cancer Neg Hx   . Stomach cancer Neg Hx        Past medical history, social, surgical and family history all reviewed in electronic medical record.   Review of Systems: No headache, visual changes, nausea, vomiting, diarrhea, constipation, dizziness, abdominal pain, skin rash, fevers, chills, night sweats, weight loss, swollen lymph nodes,  body aches, joint swelling, muscle aches, chest pain, shortness of breath, mood changes.   Objective  Weight 144 lb (65.3 kg).  Systems examined below as of 02/02/16   General: No apparent distress alert and oriented x3 mood and affect normal, dressed appropriately.  HEENT: Pupils equal, extraocular movements intact  Respiratory: Patient's speak in full sentences and does not appear short of breath  Cardiovascular: No lower extremity edema, non tender, no erythema  Skin: Warm dry intact with no signs of infection or rash on extremities or on axial skeleton.  Abdomen: Soft nontender  Neuro: Cranial nerves II through XII are intact, neurovascularly intact in all extremities with 2+ DTRs and 2+ pulses.  Lymph: No lymphadenopathy of posterior or anterior cervical chain or axillae bilaterally.  Gait normal with good balance and coordination.  MSK:  Non tender with full range of motion and good stability and symmetric strength and tone of shoulders, elbows, wrist, hip, knee and ankles bilaterally.  Neck: Inspection unremarkable. No palpable stepoffs. NEGATIVE SPURLINGS! Still painful last 5 degrees of side bending.  Grip strength and sensation normal in bilateral hands Strength good C4 to T1 distribution No sensory change to C4 to T1 Negative Hoffman sign bilaterally Reflexes normal Mild improvement   Osteopathic findings C2 flexed rotated and side bent right  C4 flexed rotated and side bent left T5 extended  rotated and side bent right inhaled third rib T7 extended rotated and side bent left L2 flexed rotated and side bent right     Impression and Recommendations:     This case required medical decision making of moderate complexity.

## 2016-02-02 NOTE — Assessment & Plan Note (Addendum)
Patient does have degenerative disease but did respond very well to an epidural. Patient is doing well with conservative therapy.Patient not to 6 week intervals. Continue the other medication and concluding the nortriptyline. We discussed the possibility of another medication which patient would like to avoid at the moment. Continue current medications and current therapy.

## 2016-02-05 ENCOUNTER — Ambulatory Visit
Admission: RE | Admit: 2016-02-05 | Discharge: 2016-02-05 | Disposition: A | Discharge status: Home / Self Care | Payer: BLUE CROSS/BLUE SHIELD | Source: Ambulatory Visit | Attending: Family Medicine | Admitting: Family Medicine

## 2016-02-05 DIAGNOSIS — N6489 Other specified disorders of breast: Secondary | ICD-10-CM

## 2016-02-05 DIAGNOSIS — N6310 Unspecified lump in the right breast, unspecified quadrant: Secondary | ICD-10-CM

## 2016-03-07 ENCOUNTER — Ambulatory Visit: Payer: BLUE CROSS/BLUE SHIELD | Admitting: Family Medicine

## 2016-03-16 ENCOUNTER — Ambulatory Visit: Payer: BLUE CROSS/BLUE SHIELD | Admitting: Family Medicine

## 2016-03-16 ENCOUNTER — Encounter: Payer: Self-pay | Admitting: Family Medicine

## 2016-03-16 ENCOUNTER — Ambulatory Visit (INDEPENDENT_AMBULATORY_CARE_PROVIDER_SITE_OTHER): Payer: BLUE CROSS/BLUE SHIELD | Admitting: Family Medicine

## 2016-03-16 VITALS — BP 142/90 | HR 86 | Ht 66.0 in | Wt 143.0 lb

## 2016-03-16 DIAGNOSIS — M999 Biomechanical lesion, unspecified: Secondary | ICD-10-CM

## 2016-03-16 NOTE — Assessment & Plan Note (Signed)
Worsening symptoms. Discussed with patient at great length. Patient seems to be doing relatively well but is having some mild increasing radicular symptoms. Does have severe arthritic changes. Discussed with patient we can repeat epidural if necessary. Patient was to continue conservative therapy. Does respond well to osteopathic manipulation. We discussed icing regimen. Discussed ergonomics. Follow-up again in 4-6 weeks.

## 2016-03-16 NOTE — Assessment & Plan Note (Signed)
Decision today to treat with OMT was based on Physical Exam  After verbal consent patient was treated with  ME, FPR techniques in cervical, thoracic and rib areas, avoided HVLA in the cervical spine.  Patient tolerated the procedure well with improvement in symptoms  Patient given exercises, stretches and lifestyle modifications  See medications in patient instructions if given  Patient will follow up in 6 weeks  Reiterated continue conservative therapy.

## 2016-03-16 NOTE — Progress Notes (Signed)
[name] [place], [place] [address] Phone: [phone] Subjective:     CC: Neck pain follow up.  Right knee pain f/u  [id number]  Lindsay [name] is a 65 y.o. female coming in with complaint of neck pain. Patient has a history of moderate arthritis in the neck And then on more recent x-rays have shown now severe osteophytic changes. Continued have pain as well as radicular symptoms and MRI was ordered. MRI independently visualized by me showing severe central canal stenosis at C5-C6 with moderate to severe bilateral foraminal stenosis. Significant arthritic and foraminal stenosis at multiple other levels as well. Was given epidural 10/6- doing much better less severe and not as much numbness.  Feels more strength as well.    Update  03/16/2016. Patient states that it started to have some mild radicular symptoms again down the neck. Patient states that it seems to be doing relatively well but in this is still intermittent. Patient denies though any weakness of the time. Concerned though that the pain is starting back and maybe needs another injection at some point.  Patient states right knee is feeling significantly better.    Past Medical History:  Diagnosis Date  . COMMON MIGRAINE 09/20/2006  . DEPRESSION 06/11/2009  . GERD 06/11/2009  . Neck pain    eval with sports medicine in 2016/17   Past Surgical History:  Procedure Laterality Date  . ABDOMINAL HYSTERECTOMY    . CHOLECYSTECTOMY    . OVARIAN CYST REMOVAL    . REPAIR EXTENSOR TENDON HAND    . TONSILLECTOMY     Social History  Substance Use Topics  . Smoking status: Former Smoker    Types: Cigarettes    Quit date: 04/04/2004  . Smokeless tobacco: Never Used  . Alcohol use 0.0 oz/week     Comment: occ   No Known Allergies Family History  Problem Relation Age of Onset  . Dementia Mother   . Osteoporosis Mother   . Breast cancer Sister   . Heart attack Brother   . Colon  cancer Neg Hx   . Esophageal cancer Neg Hx   . Rectal cancer Neg Hx   . Stomach cancer Neg Hx        Past medical history, social, surgical and family history all reviewed in electronic medical record.   Review of Systems: No headache, visual changes, nausea, vomiting, diarrhea, constipation, dizziness, abdominal pain, skin rash, fevers, chills, night sweats, weight loss, swollen lymph nodes, body aches, joint swelling, muscle aches, chest pain, shortness of breath, mood changes.   Objective  Blood pressure (!) 142/90, pulse 86, height 5\' 6"  (1.676 m), weight 143 lb (64.9 kg), SpO2 97 %.    Systems examined below as of 03/16/16 General: NAD A&O x3 mood, affect normal  HEENT: Pupils equal, extraocular movements intact no nystagmus Respiratory: not short of breath at rest or with speaking Cardiovascular: No lower extremity edema, non tender Skin: Warm dry intact with no signs of infection or rash on extremities or on axial skeleton. Abdomen: Soft nontender, no masses Neuro: Cranial nerves  intact, neurovascularly intact in all extremities with 2+ DTRs and 2+ pulses. Lymph: No lymphadenopathy appreciated today  Gait normal with good balance and coordination.  MSK: Non tender with full range of motion and good stability and symmetric strength and tone of shoulders, elbows, wrist,  knee hips and ankles bilaterally.   Neck: Inspection unremarkable. No palpable stepoffs. Negative Spurling's Continues  to lack the last 5 of extension as well as rotation. Minorly worse than previous exam Grip strength and sensation normal in bilateral hands Strength good C4 to T1 distribution No sensory change to C4 to T1 Negative Hoffman sign bilaterally Reflexes normal Mild improvement   Osteopathic findings C2 flexed rotated and side bent right  C6 flexed rotated and side bent left T3 extended rotated and side bent right inhaled third rib T9 extended rotated and side bent left L4 flexed rotated  and side bent right     Impression and Recommendations:     This case required medical decision making of moderate complexity.

## 2016-03-16 NOTE — Patient Instructions (Signed)
See me again in 4 weeks Happy holidays!

## 2016-04-12 NOTE — Progress Notes (Signed)
[name] [place], [place] [address] Phone: [phone] Subjective:     CC: Neck pain follow up.  Right knee pain f/u  [id number]  Lindsay [name] is a 66 y.o. female coming in with complaint of neck pain. Patient has a history of moderate arthritis in the neck And then on more recent x-rays have shown now severe osteophytic changes. Continued have pain as well as radicular symptoms and MRI was ordered. MRI independently visualized by me showing severe central canal stenosis at C5-C6 with moderate to severe bilateral foraminal stenosis. Significant arthritic and foraminal stenosis at multiple other levels as well. Was given epidural 10/6- Improved.   Update 04/13/2016- still stable, no worsening pain but no improvement.  Trying to be more active. No radiation or no constant numbness Patient continues to have some mild discomfort overall. Nothing severe. Patient still states though that notices the pain at all times.     Past Medical History:  Diagnosis Date  . COMMON MIGRAINE 09/20/2006  . DEPRESSION 06/11/2009  . GERD 06/11/2009  . Neck pain    eval with sports medicine in 2016/17   Past Surgical History:  Procedure Laterality Date  . ABDOMINAL HYSTERECTOMY    . CHOLECYSTECTOMY    . OVARIAN CYST REMOVAL    . REPAIR EXTENSOR TENDON HAND    . TONSILLECTOMY     Social History  Substance Use Topics  . Smoking status: Former Smoker    Types: Cigarettes    Quit date: 04/04/2004  . Smokeless tobacco: Never Used  . Alcohol use 0.0 oz/week     Comment: occ   No Known Allergies Family History  Problem Relation Age of Onset  . Dementia Mother   . Osteoporosis Mother   . Breast cancer Sister   . Heart attack Brother   . Colon cancer Neg Hx   . Esophageal cancer Neg Hx   . Rectal cancer Neg Hx   . Stomach cancer Neg Hx        Past medical history, social, surgical and family history all reviewed in electronic medical record.     Review of Systems: No headache, visual changes, nausea, vomiting, diarrhea, constipation, dizziness, abdominal pain, skin rash, fevers, chills, night sweats, weight loss, swollen lymph nodes, body aches, joint swelling, chest pain, shortness of breath, mood changes.     Objective  Blood pressure 135/90, pulse 72, height 5\' 6"  (1.676 m), weight 146 lb 9.6 oz (66.5 kg).    Systems examined below as of 04/13/16 General: NAD A&O x3 mood, affect normal  HEENT: Pupils equal, extraocular movements intact no nystagmus Respiratory: not short of breath at rest or with speaking Cardiovascular: No lower extremity edema, non tender Skin: Warm dry intact with no signs of infection or rash on extremities or on axial skeleton. Abdomen: Soft nontender, no masses Neuro: Cranial nerves  intact, neurovascularly intact in all extremities with 2+ DTRs and 2+ pulses. Lymph: No lymphadenopathy appreciated today  Gait normal with good balance and coordination.  MSK: Non tender with full range of motion and good stability and symmetric strength and tone of shoulders, elbows, wrist,  knee hips and ankles bilaterally.   Neck: Inspection unremarkable. No palpable stepoffs. Negative Spurling's Continues to have some limitation of extension. Grip strength and sensation normal in bilateral hands Strength good C4 to T1 distribution No sensory change to C4 to T1 Negative Hoffman sign bilaterally Reflexes normal Stable.    Osteopathic findings C2  flexed rotated and side bent right  C6 flexed rotated and side bent left T3 extended rotated and side bent right inhaled third rib T9 extended rotated and side bent left L4 flexed rotated and side bent right     Impression and Recommendations:     This case required medical decision making of moderate complexity.

## 2016-04-13 ENCOUNTER — Encounter: Payer: Self-pay | Admitting: Family Medicine

## 2016-04-13 ENCOUNTER — Ambulatory Visit (INDEPENDENT_AMBULATORY_CARE_PROVIDER_SITE_OTHER): Payer: BLUE CROSS/BLUE SHIELD | Admitting: Family Medicine

## 2016-04-13 VITALS — BP 135/90 | HR 72 | Ht 66.0 in | Wt 146.6 lb

## 2016-04-13 DIAGNOSIS — M999 Biomechanical lesion, unspecified: Secondary | ICD-10-CM | POA: Diagnosis not present

## 2016-04-13 DIAGNOSIS — M5030 Other cervical disc degeneration, unspecified cervical region: Secondary | ICD-10-CM | POA: Diagnosis not present

## 2016-04-13 NOTE — Assessment & Plan Note (Signed)
Stainless moment. No significant radicular symptoms. Has been noncompliant with medications. Encourage her to do an regular basis. We discussed icing regimen. Patient will continue see me in 4-6 week intervals.

## 2016-04-13 NOTE — Assessment & Plan Note (Signed)
Decision today to treat with OMT was based on Physical Exam  After verbal consent patient was treated with  ME, FPR techniques in cervical, thoracic and rib areas, avoided HVLA in the cervical spine.  Patient tolerated the procedure well with improvement in symptoms  Patient given exercises, stretches and lifestyle modifications  See medications in patient instructions if given  Patient will follow up in 6 weeks  Reiterated continue conservative therapy.

## 2016-05-15 NOTE — Progress Notes (Signed)
[name] [place], [place] [address] Phone: [phone] Subjective:     CC: Neck pain follow up.  Right knee pain f/u  [id number]  Lindsay [name] is a 66 y.o. female coming in with complaint of neck pain. Patient has a history of moderate arthritis in the neck And then on more recent x-rays have shown now severe osteophytic changes. Continued have pain as well as radicular symptoms and MRI was ordered. MRI independently visualized by me showing severe central canal stenosis at C5-C6 with moderate to severe bilateral foraminal stenosis. Significant arthritic and foraminal stenosis at multiple other levels as well. Was given epidural 10/6- Improved.   Update 04/13/2016- still stable, no worsening pain but no improvement.  Trying to be more active. No radiation or no constant numbness.  Patient continues to have some mild discomfort overall. Nothing severe. Patient still states though that notices the pain at all times.   Update 05/16/16- No worsening pain. No worsening numbness,, No weakness. Still though intermittent numbness down the right arm.  Rates the severity 6/10.  No new symptoms just worsening of previous symptoms.    Past Medical History:  Diagnosis Date  . COMMON MIGRAINE 09/20/2006  . DEPRESSION 06/11/2009  . GERD 06/11/2009  . Neck pain    eval with sports medicine in 2016/17   Past Surgical History:  Procedure Laterality Date  . ABDOMINAL HYSTERECTOMY    . CHOLECYSTECTOMY    . OVARIAN CYST REMOVAL    . REPAIR EXTENSOR TENDON HAND    . TONSILLECTOMY     Social History  Substance Use Topics  . Smoking status: Former Smoker    Types: Cigarettes    Quit date: 04/04/2004  . Smokeless tobacco: Never Used  . Alcohol use 0.0 oz/week     Comment: occ   No Known Allergies Family History  Problem Relation Age of Onset  . Dementia Mother   . Osteoporosis Mother   . Breast cancer Sister   . Heart attack Brother   . Colon  cancer Neg Hx   . Esophageal cancer Neg Hx   . Rectal cancer Neg Hx   . Stomach cancer Neg Hx        Past medical history, social, surgical and family history all reviewed in electronic medical record.   Review of Systems: No headache, visual changes, nausea, vomiting, diarrhea, constipation, dizziness, abdominal pain, skin rash, fevers, chills, night sweats, weight loss, swollen lymph nodes,chest pain, shortness of breath, mood changes.  Marland Kitchen Positive body aches, mild numbness in the right arm    Objective  Blood pressure 128/82, pulse 67, height 5\' 6"  (1.676 m), weight 145 lb (65.8 kg), SpO2 98 %.    Systems examined below as of 05/16/16 General: NAD A&O x3 mood, affect normal  HEENT: Pupils equal, extraocular movements intact no nystagmus Respiratory: not short of breath at rest or with speaking Cardiovascular: No lower extremity edema, non tender Skin: Warm dry intact with no signs of infection or rash on extremities or on axial skeleton. Abdomen: Soft nontender, no masses Neuro: Cranial nerves  intact, neurovascularly intact in all extremities with 2+ DTRs and 2+ pulses. Lymph: No lymphadenopathy appreciated today  Gait normal with good balance and coordination.  MSK: Non tender with full range of motion and good stability and symmetric strength and tone of shoulders, elbows, wrist,  knee hips and ankles bilaterally.  Arthritic changes of multiple joint.  Neck: Inspection unremarkable. No palpable  stepoffs. Negative Spurling's maneuver. No worsening radicular symptoms Continued limitation in the extension as well as rotation and side bending bilaterally Grip strength and sensation normal in bilateral hands Strength good C4 to T1 distribution No sensory change to C4 to T1 Negative Hoffman sign bilaterally Reflexes normal   Osteopathic findings Cervical C2 flexed rotated and side bent right C5 flexed rotated and side bent left T3 extended rotated and side bent right  inhaled third rib T6 extended rotated and side bent left L3 flexed rotated and side bent right Sacrum right on right    Impression and Recommendations:     This case required medical decision making of moderate complexity.

## 2016-05-16 ENCOUNTER — Encounter: Payer: Self-pay | Admitting: Family Medicine

## 2016-05-16 ENCOUNTER — Ambulatory Visit (INDEPENDENT_AMBULATORY_CARE_PROVIDER_SITE_OTHER): Payer: BLUE CROSS/BLUE SHIELD | Admitting: Family Medicine

## 2016-05-16 VITALS — BP 128/82 | HR 67 | Ht 66.0 in | Wt 145.0 lb

## 2016-05-16 DIAGNOSIS — M5030 Other cervical disc degeneration, unspecified cervical region: Secondary | ICD-10-CM | POA: Diagnosis not present

## 2016-05-16 DIAGNOSIS — M999 Biomechanical lesion, unspecified: Secondary | ICD-10-CM | POA: Diagnosis not present

## 2016-05-16 NOTE — Patient Instructions (Signed)
Good to see you  Lindsay [name] is your friend He better go all out.  See me again in 4 weeks call me sooner if you need epidural

## 2016-05-16 NOTE — Assessment & Plan Note (Signed)
Decision today to treat with OMT was based on Physical Exam  After verbal consent patient was treated with HVLA, ME, FPR techniques in cervical, thoracic, rib, lumbar and sacral areas  Patient tolerated the procedure well with improvement in symptoms  Patient given exercises, stretches and lifestyle modifications  See medications in patient instructions if given  Patient will follow up in 4 weeks 

## 2016-05-16 NOTE — Assessment & Plan Note (Signed)
Degenerative disc disease of the cervical spine. Patient seems to be doing alright with some radicular symptoms. We discussed icing regimen. Patient is had worsening symptoms of previous symptoms. Does respond fairly well to osteopathic manipulation. If worsening symptoms we can do another epidural. Follow-up again in 4 weeks.

## 2016-06-12 NOTE — Progress Notes (Deleted)
[name] [place], [place] [address] Phone: [phone] Subjective:     CC: Neck pain follow up.  Right knee pain f/u  [id number]  Lindsay [name] is a 66 y.o. female coming in with complaint of neck pain. Patient has a history of moderate arthritis in the neck And then on more recent x-rays have shown now severe osteophytic changes. Continued have pain as well as radicular symptoms and MRI was ordered. MRI independently visualized by me showing severe central canal stenosis at C5-C6 with moderate to severe bilateral foraminal stenosis. Significant arthritic and foraminal stenosis at multiple other levels as well. Was given epidural 10/6- Improved.   Update 04/13/2016- still stable, no worsening pain but no improvement.  Trying to be more active. No radiation or no constant numbness.  Patient continues to have some mild discomfort overall. Nothing severe. Patient still states though that notices the pain at all times.   Update 05/16/16- No worsening pain. No worsening numbness,, No weakness. Still though intermittent numbness down the right arm.  Rates the severity 6/10.  No new symptoms just worsening of previous symptoms.    Past Medical History:  Diagnosis Date  . COMMON MIGRAINE 09/20/2006  . DEPRESSION 06/11/2009  . GERD 06/11/2009  . Neck pain    eval with sports medicine in 2016/17   Past Surgical History:  Procedure Laterality Date  . ABDOMINAL HYSTERECTOMY    . CHOLECYSTECTOMY    . OVARIAN CYST REMOVAL    . REPAIR EXTENSOR TENDON HAND    . TONSILLECTOMY     Social History  Substance Use Topics  . Smoking status: Former Smoker    Types: Cigarettes    Quit date: 04/04/2004  . Smokeless tobacco: Never Used  . Alcohol use 0.0 oz/week     Comment: occ   No Known Allergies Family History  Problem Relation Age of Onset  . Dementia Mother   . Osteoporosis Mother   . Breast cancer Sister   . Heart attack Brother   . Colon  cancer Neg Hx   . Esophageal cancer Neg Hx   . Rectal cancer Neg Hx   . Stomach cancer Neg Hx        Past medical history, social, surgical and family history all reviewed in electronic medical record.   Review of Systems: No headache, visual changes, nausea, vomiting, diarrhea, constipation, dizziness, abdominal pain, skin rash, fevers, chills, night sweats, weight loss, swollen lymph nodes,chest pain, shortness of breath, mood changes.  Marland Kitchen Positive body aches, mild numbness in the right arm    Objective  There were no vitals taken for this visit.    Systems examined below as of 06/12/16 General: NAD A&O x3 mood, affect normal  HEENT: Pupils equal, extraocular movements intact no nystagmus Respiratory: not short of breath at rest or with speaking Cardiovascular: No lower extremity edema, non tender Skin: Warm dry intact with no signs of infection or rash on extremities or on axial skeleton. Abdomen: Soft nontender, no masses Neuro: Cranial nerves  intact, neurovascularly intact in all extremities with 2+ DTRs and 2+ pulses. Lymph: No lymphadenopathy appreciated today  Gait normal with good balance and coordination.  MSK: Non tender with full range of motion and good stability and symmetric strength and tone of shoulders, elbows, wrist,  knee hips and ankles bilaterally.  Arthritic changes of multiple joint.  Neck: Inspection unremarkable. No palpable stepoffs. Negative Spurling's maneuver. No worsening radicular symptoms Continued limitation  in the extension as well as rotation and side bending bilaterally Grip strength and sensation normal in bilateral hands Strength good C4 to T1 distribution No sensory change to C4 to T1 Negative Hoffman sign bilaterally Reflexes normal   Osteopathic findings Cervical C2 flexed rotated and side bent right C5 flexed rotated and side bent left T3 extended rotated and side bent right inhaled third rib T6 extended rotated and side bent  left L3 flexed rotated and side bent right Sacrum right on right    Impression and Recommendations:     This case required medical decision making of moderate complexity.

## 2016-06-13 ENCOUNTER — Ambulatory Visit: Payer: BLUE CROSS/BLUE SHIELD | Admitting: Family Medicine

## 2016-06-13 ENCOUNTER — Encounter: Payer: Self-pay | Admitting: Family Medicine

## 2016-06-23 ENCOUNTER — Encounter: Payer: BLUE CROSS/BLUE SHIELD | Admitting: Family Medicine

## 2016-06-24 ENCOUNTER — Encounter: Payer: BLUE CROSS/BLUE SHIELD | Admitting: Family Medicine

## 2016-06-28 ENCOUNTER — Ambulatory Visit (INDEPENDENT_AMBULATORY_CARE_PROVIDER_SITE_OTHER): Payer: BLUE CROSS/BLUE SHIELD | Admitting: Family Medicine

## 2016-06-28 ENCOUNTER — Encounter: Payer: Self-pay | Admitting: Family Medicine

## 2016-06-28 VITALS — BP 130/86 | HR 76 | Ht 66.0 in | Wt 146.0 lb

## 2016-06-28 DIAGNOSIS — M999 Biomechanical lesion, unspecified: Secondary | ICD-10-CM

## 2016-06-28 DIAGNOSIS — M5030 Other cervical disc degeneration, unspecified cervical region: Secondary | ICD-10-CM | POA: Diagnosis not present

## 2016-06-28 NOTE — Patient Instructions (Signed)
Good to see you  Lindsay [name] to cook or only take out.  See me again in 5-6 weeks.

## 2016-06-28 NOTE — Progress Notes (Signed)
[name] [place], [place] [address] Phone: [phone] Subjective:     CC: Neck pain follow up.  Right knee pain f/u  [id number]  Lindsay [name] is a 66 y.o. female coming in with complaint of neck pain. Patient has a history of moderate arthritis in the neck And then on more recent x-rays have shown now severe osteophytic changes. Continued have pain as well as radicular symptoms and MRI was ordered. MRI independently visualized by me showing severe central canal stenosis at C5-C6 with moderate to severe bilateral foraminal stenosis. Significant arthritic and foraminal stenosis at multiple other levels as well. Was given epidural 10/6- Improved.   Update 04/13/2016- still stable, no worsening pain but no improvement.  Trying to be more active. No radiation or no constant numbness.  Patient continues to have some mild discomfort overall. Nothing severe. Patient still states though that notices the pain at all times.   Update 05/16/16- No worsening pain. No worsening numbness,, No weakness. Still though intermittent numbness down the right arm.  Rates the severity 6/10.  No new symptoms just worsening of previous symptoms.   06/28/2016 update- patient states that overall she has been doing relatively well. Still has some tingling in the fingertips but not all the time. Seems to be only at night. Patient does not want to do any other medications and does not want any significant increase in more aggressive therapies. Patient will continue to be active.   Past Medical History:  Diagnosis Date  . COMMON MIGRAINE 09/20/2006  . DEPRESSION 06/11/2009  . GERD 06/11/2009  . Neck pain    eval with sports medicine in 2016/17   Past Surgical History:  Procedure Laterality Date  . ABDOMINAL HYSTERECTOMY    . CHOLECYSTECTOMY    . OVARIAN CYST REMOVAL    . REPAIR EXTENSOR TENDON HAND    . TONSILLECTOMY     Social History  Substance Use Topics  .  Smoking status: Former Smoker    Types: Cigarettes    Quit date: 04/04/2004  . Smokeless tobacco: Never Used  . Alcohol use 0.0 oz/week     Comment: occ   No Known Allergies Family History  Problem Relation Age of Onset  . Dementia Mother   . Osteoporosis Mother   . Breast cancer Sister   . Heart attack Brother   . Colon cancer Neg Hx   . Esophageal cancer Neg Hx   . Rectal cancer Neg Hx   . Stomach cancer Neg Hx        Past medical history, social, surgical and family history all reviewed in electronic medical record.   Review of Systems: No headache, visual changes, nausea, vomiting, diarrhea, constipation, dizziness, abdominal pain, skin rash, fevers, chills, night sweats, weight loss, swollen lymph nodes, body aches, joint swelling, muscle aches, chest pain, shortness of breath, mood changes.     Objective  Blood pressure 130/86, pulse 76, height 5\' 6"  (1.676 m), weight 146 lb (66.2 kg).    Systems examined below as of 06/28/16 General: NAD A&O x3 mood, affect normal  HEENT: Pupils equal, extraocular movements intact no nystagmus Respiratory: not short of breath at rest or with speaking Cardiovascular: No lower extremity edema, non tender Skin: Warm dry intact with no signs of infection or rash on extremities or on axial skeleton. Abdomen: Soft nontender, no masses Neuro: Cranial nerves  intact, neurovascularly intact in all extremities with 2+ DTRs and 2+ pulses. Lymph:  No lymphadenopathy appreciated today  Gait normal with good balance and coordination.  MSK: Non tender with full range of motion and good stability and symmetric strength and tone of shoulders, elbows, wrist,  knee hips and ankles bilaterally.   Neck: Inspection unremarkable. No palpable stepoffs. Negative Spurling's maneuver.  Patient continues to have some limitation lacking the last 5-10 in rotation and side bent a bilaterally. Grip strength and sensation normal in bilateral hands Strength good C4  to T1 distribution No sensory change to C4 to T1 Negative Hoffman sign bilaterally Reflexes normal   Osteopathic findings Cervical C7 flexed rotated and side bent left T3 extended rotated and side bent right inhaled third rib T6 extended rotated and side bent left L2 flexed rotated and side bent right Sacrum right on right     Impression and Recommendations:     This case required medical decision making of moderate complexity.

## 2016-06-28 NOTE — Assessment & Plan Note (Signed)
Decision today to treat with OMT was based on Physical Exam  After verbal consent patient was treated with HVLA, ME, FPR techniques in cervical, thoracic,rib areas  Patient tolerated the procedure well with improvement in symptoms  Patient given exercises, stretches and lifestyle modifications  See medications in patient instructions if given  Patient will follow up in 5-6 weeks

## 2016-06-28 NOTE — Assessment & Plan Note (Signed)
Stable oral. Patient continues to respond fairly well to osteopathic manipulation. No significant medication changes. No changes in management. Follow-up again in 5-6 weeks.

## 2016-07-15 ENCOUNTER — Ambulatory Visit (INDEPENDENT_AMBULATORY_CARE_PROVIDER_SITE_OTHER): Payer: BLUE CROSS/BLUE SHIELD | Admitting: Family Medicine

## 2016-07-15 ENCOUNTER — Encounter: Payer: Self-pay | Admitting: Family Medicine

## 2016-07-15 ENCOUNTER — Encounter: Payer: Self-pay | Admitting: *Deleted

## 2016-07-15 VITALS — BP 122/90 | HR 85 | Temp 98.1°F | Ht 66.0 in | Wt 145.0 lb

## 2016-07-15 DIAGNOSIS — R030 Elevated blood-pressure reading, without diagnosis of hypertension: Secondary | ICD-10-CM

## 2016-07-15 DIAGNOSIS — L918 Other hypertrophic disorders of the skin: Secondary | ICD-10-CM | POA: Diagnosis not present

## 2016-07-15 DIAGNOSIS — R21 Rash and other nonspecific skin eruption: Secondary | ICD-10-CM | POA: Diagnosis not present

## 2016-07-15 MED ORDER — CLINDAMYCIN PHOSPHATE 1 % EX GEL: Freq: Two times a day (BID) | CUTANEOUS | 3 refills | Status: DC

## 2016-07-15 NOTE — Progress Notes (Signed)
Pre visit review using our clinic review tool, if applicable. No additional management support is needed unless otherwise documented below in the visit note. 

## 2016-07-15 NOTE — Progress Notes (Signed)
HPI:  Acute visit for several issues:  Small skin tags L axillary: -desired removal for a wedding -sometimes catch on bra or dress  Skin rash groin: -recurrent bumps groin her whole life -red bumps intermittently, sometimes pustules  Elevated BP: -no CP, SOB, DOE, HA  ROS: See pertinent positives and negatives per HPI.  Past Medical History:  Diagnosis Date  . COMMON MIGRAINE 09/20/2006  . DEPRESSION 06/11/2009  . GERD 06/11/2009  . Neck pain    eval with sports medicine in 2016/17    Past Surgical History:  Procedure Laterality Date  . ABDOMINAL HYSTERECTOMY    . CHOLECYSTECTOMY    . OVARIAN CYST REMOVAL    . REPAIR EXTENSOR TENDON HAND    . TONSILLECTOMY      Family History  Problem Relation Age of Onset  . Dementia Mother   . Osteoporosis Mother   . Breast cancer Sister   . Heart attack Brother   . Colon cancer Neg Hx   . Esophageal cancer Neg Hx   . Rectal cancer Neg Hx   . Stomach cancer Neg Hx     Social History   Social History  . Marital status: Married    Spouse name: N/A  . Number of children: N/A  . Years of education: N/A   Social History Main Topics  . Smoking status: Former Smoker    Types: Cigarettes    Quit date: 04/04/2004  . Smokeless tobacco: Never Used  . Alcohol use 0.0 oz/week     Comment: occ  . Drug use: No  . Sexual activity: Not Asked   Other Topics Concern  . None   Social History Narrative  . None     Current Outpatient Prescriptions:  .  aspirin 81 MG tablet, Take 81 mg by mouth daily.  , Disp: , Rfl:  .  B Complex Vitamins (VITAMIN B-COMPLEX PO), Take by mouth., Disp: , Rfl:  .  buPROPion (WELLBUTRIN SR) 150 MG 12 hr tablet, TAKE 1 TABLET TWICE A DAY, Disp: 60 tablet, Rfl: 3 .  Calcium Carbonate-Vit D-Min (CALCIUM 1200 PO), Take 1 tablet by mouth daily.  , Disp: , Rfl:  .  Cholecalciferol (VITAMIN D3) 1000 UNITS capsule, Take 1,000 Units by mouth daily.  , Disp: , Rfl:  .  diphenhydramine-acetaminophen (TYLENOL  PM) 25-500 MG TABS, Take 1 tablet by mouth at bedtime as needed., Disp: , Rfl:  .  Docusate Sodium (COLACE PO), Take by mouth., Disp: , Rfl:  .  fish oil-omega-3 fatty acids 1000 MG capsule, Take 1 g by mouth daily., Disp: , Rfl:  .  MAGNESIUM PO, Take by mouth., Disp: , Rfl:  .  niacin 250 MG tablet, Take 250 mg by mouth daily with breakfast.  , Disp: , Rfl:  .  nortriptyline (PAMELOR) 25 MG capsule, TAKE 1 CAPSULE (25 MG TOTAL) BY MOUTH AT BEDTIME. (Patient taking differently: Take 25 mg by mouth at bedtime as needed. ), Disp: 30 capsule, Rfl: 3 .  pantoprazole (PROTONIX) 40 MG tablet, Take 1 tablet (40 mg total) by mouth daily. (Patient taking differently: Take 40 mg by mouth daily as needed. ), Disp: 30 tablet, Rfl: 5 .  SUMAtriptan (IMITREX) 100 MG tablet, Take 1/2-1 tablet at the first signs of a migraine. May repeat once in 2 hours if needed. Do not take more than 2 doses in 24 hours., Disp: 9 tablet, Rfl: 2 .  Tretinoin, Facial Wrinkles, (TRETINOIN, EMOLLIENT,) 0.05 % CREA, Apply a small amount  to affected area nightly, Disp: 20 g, Rfl: 1 .  vitamin E 400 UNIT capsule, Take 400 Units by mouth daily.  , Disp: , Rfl:  .  clindamycin (CLINDAGEL) 1 % gel, Apply topically 2 (two) times daily., Disp: 30 g, Rfl: 3  EXAM:  Vitals:   07/15/16 1255 07/15/16 1256  BP: 122/90 122/90  Pulse: 85   Temp: 98.1 F (36.7 C)     Body mass index is 23.4 kg/m.  GENERAL: vitals reviewed and listed above, alert, oriented, appears well hydrated and in no acute distress  HEENT: atraumatic, conjunttiva clear, no obvious abnormalities on inspection of external nose and ears  NECK: no obvious masses on inspection  LUNGS: clear to auscultation bilaterally, no wheezes, rales or rhonchi, good air movement  CV: HRRR, no peripheral edema  SKIN: skin tags R neck x1, R chest x2, L chest x3 3 small sub cut nudules R groin, 1 similar L groin, several scars from prior lesions  MS: moves all extremities  without noticeable abnormality  PSYCH: pleasant and cooperative, no obvious depression or anxiety  ASSESSMENT AND PLAN:  Discussed the following assessment and plan:  Skin rash -we discussed possible serious and likely etiologies, workup and treatment, treatment risks and return precautions, look like cysts, perhaps very mild hydradenitis -after this discussion, Lindsay [name] opted for trial clinda gel and derm eval if persists -of course, we advised Lindsay [name]  to return or notify a doctor immediately if symptoms worsen or persist or new concerns arise.  Cutaneous skin tags -discussed options for removal, removal x6, she prefers removal with sharp sterile scissors without local anesthetic, area cleaned with alcohol, lesions removed with sterile forceps and sharp scissors, no bleeding, tolerated well, dressing applied. Wound care recs.  Elevated BP without diagnosis of hypertension -ok on prior checks, she prefers to monitor, has upcoming physical and will recheck then -lifestyle recs in interim  -Patient advised to return or notify a doctor immediately if symptoms worsen or persist or new concerns arise.  Patient Instructions  Follow up as scheduled.  Clinda gel twice daily for the bumps. See dermatologist if persist despite treatment.  Keep area where skin tags were remove clean and dry. Apply small amount antibiotic oint if red or irritated and seek care if any signs of infection.     Colin Benton R., DO

## 2016-07-15 NOTE — Patient Instructions (Signed)
Follow up as scheduled.  Clinda gel twice daily for the bumps. See dermatologist if persist despite treatment.  Keep area where skin tags were remove clean and dry. Apply small amount antibiotic oint if red or irritated and seek care if any signs of infection.

## 2016-08-02 ENCOUNTER — Encounter: Payer: Self-pay | Admitting: Family Medicine

## 2016-08-02 ENCOUNTER — Ambulatory Visit (INDEPENDENT_AMBULATORY_CARE_PROVIDER_SITE_OTHER): Payer: BLUE CROSS/BLUE SHIELD | Admitting: Family Medicine

## 2016-08-02 VITALS — BP 156/102 | HR 71 | Resp 16 | Wt 146.2 lb

## 2016-08-02 DIAGNOSIS — M5030 Other cervical disc degeneration, unspecified cervical region: Secondary | ICD-10-CM

## 2016-08-02 DIAGNOSIS — M999 Biomechanical lesion, unspecified: Secondary | ICD-10-CM

## 2016-08-02 NOTE — Progress Notes (Signed)
[name] [address] [place], [place] [address] Phone: [phone] Subjective:     CC: Neck pain follow up.  Right knee pain f/u  [id number]  Lindsay [name] is a 66 y.o. female coming in with complaint of neck pain. Patient has a history of moderate arthritis in the neck And then on more recent x-rays have shown now severe osteophytic changes. Continued have pain as well as radicular symptoms and MRI was ordered. MRI independently visualized by me showing severe central canal stenosis at C5-C6 with moderate to severe bilateral foraminal stenosis. Significant arthritic and foraminal stenosis at multiple other levels as well. Was given epidural 10/6- Improved.   Update 04/13/2016- still stable, no worsening pain but no improvement.  Trying to be more active. No radiation or no constant numbness.  Patient continues to have some mild discomfort overall. Nothing severe. Patient still states though that notices the pain at all times.   Update 05/16/16- No worsening pain. No worsening numbness,, No weakness. Still though intermittent numbness down the right arm.  Rates the severity 6/10.  No new symptoms just worsening of previous symptoms.   06/28/2016 update- patient states that overall she has been doing relatively well. Still has some tingling in the fingertips but not all the time. Seems to be only at night. Patient does not want to do any other medications and does not want any significant increase in more aggressive therapies. Patient will continue to be active.  08/02/2016 update-patient is been doing relatively well but is still having some tingling. Seems to be worse at night. Has noticed some more fatigue as well. Patient denies any numbness or tingling. Denies any weakness at this time.   Past Medical History:  Diagnosis Date  . COMMON MIGRAINE 09/20/2006  . DEPRESSION 06/11/2009  . GERD 06/11/2009  . Neck pain    eval with sports medicine in  2016/17   Past Surgical History:  Procedure Laterality Date  . ABDOMINAL HYSTERECTOMY    . CHOLECYSTECTOMY    . OVARIAN CYST REMOVAL    . REPAIR EXTENSOR TENDON HAND    . TONSILLECTOMY     Social History  Substance Use Topics  . Smoking status: Former Smoker    Types: Cigarettes    Quit date: 04/04/2004  . Smokeless tobacco: Never Used  . Alcohol use 0.0 oz/week     Comment: occ   No Known Allergies Family History  Problem Relation Age of Onset  . Dementia Mother   . Osteoporosis Mother   . Breast cancer Sister   . Heart attack Brother   . Colon cancer Neg Hx   . Esophageal cancer Neg Hx   . Rectal cancer Neg Hx   . Stomach cancer Neg Hx        Past medical history, social, surgical and family history all reviewed in electronic medical record.   Review of Systems: No headache, visual changes, nausea, vomiting, diarrhea, constipation, dizziness, abdominal pain, skin rash, fevers, chills, night sweats, weight loss, swollen lymph nodes, body aches, joint swelling, muscle aches, chest pain, shortness of breath, mood changes.     Objective  Blood pressure (!) 156/102, pulse 71, resp. rate 16, weight 146 lb 4 oz (66.3 kg), SpO2 94 %.    Systems examined below as of 08/02/16 General: NAD A&O x3 mood, affect normal  HEENT: Pupils equal, extraocular movements intact no nystagmus Respiratory: not short of breath at rest or with speaking Cardiovascular: No lower  extremity edema, non tender Skin: Warm dry intact with no signs of infection or rash on extremities or on axial skeleton. Abdomen: Soft nontender, no masses Neuro: Cranial nerves  intact, neurovascularly intact in all extremities with 2+ DTRs and 2+ pulses. Lymph: No lymphadenopathy appreciated today  Gait normal with good balance and coordination.  MSK: Non tender with full range of motion and good stability and symmetric strength and tone of shoulders, elbows, wrist,  knee hips and ankles bilaterally.    Neck: Inspection unremarkable. No palpable stepoffs. Negative Spurling's maneuver.  Continued limitation lacking the last 5-10 in all planes Grip strength and sensation normal in bilateral hands Strength good C4 to T1 distribution No sensory change to C4 to T1 Negative Hoffman sign bilaterally Reflexes normal   Osteopathic findings Cervical C2 flexed rotated and side bent right C4 flexed rotated and side bent left C7 flexed rotated and side bent left T3 extended rotated and side bent right T6 extended rotated and side bent right inhaled 8 rib       Impression and Recommendations:     This case required medical decision making of moderate complexity.

## 2016-08-02 NOTE — Patient Instructions (Signed)
Good to see you  Iron 65mg  daily with 500mg  vitamin C  Keep it up! You are great  See em again in 4-6 weeks.

## 2016-08-02 NOTE — Assessment & Plan Note (Signed)
Decision today to treat with OMT was based on Physical Exam  After verbal consent patient was treated with HVLA, ME, FPR techniques in cervical, thoracic, rib areas  Patient tolerated the procedure well with improvement in symptoms  Patient given exercises, stretches and lifestyle modifications  See medications in patient instructions if given  Patient will follow up in 4-8 weeks 

## 2016-08-02 NOTE — Progress Notes (Signed)
Pre-visit discussion using our clinic review tool. No additional management support is needed unless otherwise documented below in the visit note.  

## 2016-08-02 NOTE — Assessment & Plan Note (Signed)
Patient does have some degenerative disc noted. We discussed icing regimen and home exercises. We discussed which activities doing which ones to avoid. We discussed possible iron supplementation to help with some of the tingling sensation she gets at night.

## 2016-08-03 ENCOUNTER — Ambulatory Visit: Payer: BLUE CROSS/BLUE SHIELD | Admitting: Family Medicine

## 2016-08-09 NOTE — Progress Notes (Signed)
HPI:  Here for CPE:  -Concerns and/or follow up today:   She wants to check ferritin as reports her sports medicine doctor thought her eyelids look pale. Hx of iron def anemai related to heavy menstrual bleeding - resolved after hysterectomy. No weakness, dizziness, palpitation, melena, hematochezia.  Elevated BP: -at OV in April, recheck today planned  GERD: -uses PPI sometimes for chronic GERD  -rare dysphagia, refused GI evaluation in the past,  -hx dilation of esophagus remotely -sstable  Migraines: -Uses prn triptan, requests refill   Depression and stress: Chronic depression, treated with Wellbutrin Reports mood good  No significant anxiety, panic disorder, manic symptoms, thoughts of self-harm, worsening depressed mood or psychosis.  Mild hyperlipidemia: -No regular exercise -Reports a healthy diet  -Taking folic acid, vitamin D or calcium: no  -Diabetes and Dyslipidemia Screening: FASTING for labs  -Vaccines: UTD  -pap history: s/p complete hysterectomy for fibroids per report  -FDLMP: n/a  -wants STI testing (Hep C if born [birth year]): no  -FH breast, colon or ovarian ca: see FH Last mammogram: per last radiology report - bilat mammo in April advised, hx benign cyst Last colon cancer screening: last done 08/2014 - 10 year repeat   -Alcohol, Tobacco, drug use: see social history  Review of Systems - no fevers, unintentional weight loss, vision loss, hearing loss, chest pain, sob, hemoptysis, melena, hematochezia, hematuria, genital discharge, changing or concerning skin lesions, bleeding, bruising, loc, thoughts of self harm or SI  Past Medical History:  Diagnosis Date  . COMMON MIGRAINE 09/20/2006  . DEPRESSION 06/11/2009  . GERD 06/11/2009  . Neck pain    eval with sports medicine in 2016/17    Past Surgical History:  Procedure Laterality Date  . ABDOMINAL HYSTERECTOMY    . CHOLECYSTECTOMY    . OVARIAN CYST REMOVAL    . REPAIR EXTENSOR TENDON  HAND    . TONSILLECTOMY      Family History  Problem Relation Age of Onset  . Dementia Mother   . Osteoporosis Mother   . Breast cancer Sister   . Heart attack Brother   . Colon cancer Neg Hx   . Esophageal cancer Neg Hx   . Rectal cancer Neg Hx   . Stomach cancer Neg Hx     Social History   Social History  . Marital status: Married    Spouse name: N/A  . Number of children: N/A  . Years of education: N/A   Social History Main Topics  . Smoking status: Former Smoker    Types: Cigarettes    Quit date: 04/04/2004  . Smokeless tobacco: Never Used  . Alcohol use 0.0 oz/week     Comment: occ  . Drug use: No  . Sexual activity: Not Asked   Other Topics Concern  . None   Social History Narrative  . None     Current Outpatient Prescriptions:  .  aspirin 81 MG tablet, Take 81 mg by mouth daily.  , Disp: , Rfl:  .  B Complex Vitamins (VITAMIN B-COMPLEX PO), Take by mouth., Disp: , Rfl:  .  buPROPion (WELLBUTRIN SR) 150 MG 12 hr tablet, TAKE 1 TABLET TWICE A DAY, Disp: 60 tablet, Rfl: 3 .  Calcium Carbonate-Vit D-Min (CALCIUM 1200 PO), Take 1 tablet by mouth daily.  , Disp: , Rfl:  .  Cholecalciferol (VITAMIN D3) 1000 UNITS capsule, Take 1,000 Units by mouth daily.  , Disp: , Rfl:  .  clindamycin (CLINDAGEL) 1 % gel, Apply topically  2 (two) times daily., Disp: 30 g, Rfl: 3 .  diphenhydramine-acetaminophen (TYLENOL PM) 25-500 MG TABS, Take 1 tablet by mouth at bedtime as needed., Disp: , Rfl:  .  Docusate Sodium (COLACE PO), Take by mouth., Disp: , Rfl:  .  fish oil-omega-3 fatty acids 1000 MG capsule, Take 1 g by mouth daily., Disp: , Rfl:  .  MAGNESIUM PO, Take by mouth., Disp: , Rfl:  .  niacin 250 MG tablet, Take 250 mg by mouth daily with breakfast.  , Disp: , Rfl:  .  nortriptyline (PAMELOR) 25 MG capsule, TAKE 1 CAPSULE (25 MG TOTAL) BY MOUTH AT BEDTIME. (Patient taking differently: Take 25 mg by mouth at bedtime as needed. ), Disp: 30 capsule, Rfl: 3 .  pantoprazole  (PROTONIX) 40 MG tablet, Take 1 tablet (40 mg total) by mouth daily. (Patient taking differently: Take 40 mg by mouth daily as needed. ), Disp: 30 tablet, Rfl: 5 .  SUMAtriptan (IMITREX) 100 MG tablet, Take 1/2-1 tablet at the first signs of a migraine. May repeat once in 2 hours if needed. Do not take more than 2 doses in 24 hours., Disp: 9 tablet, Rfl: 2 .  Tretinoin, Facial Wrinkles, (TRETINOIN, EMOLLIENT,) 0.05 % CREA, Apply a small amount to affected area nightly, Disp: 20 g, Rfl: 1 .  vitamin E 400 UNIT capsule, Take 400 Units by mouth daily.  , Disp: , Rfl:   EXAM:  Vitals:   08/11/16 0803  BP: 120/80  Pulse: 99  Temp: 97.9 F (36.6 C)   Body mass index is 23.5 kg/m.  GENERAL: vitals reviewed and listed below, alert, oriented, appears well hydrated and in no acute distress  HEENT: head atraumatic, PERRLA, normal appearance of eyes, ears, nose and mouth. moist mucus membranes.  NECK: supple, no masses or lymphadenopathy  LUNGS: clear to auscultation bilaterally, no rales, rhonchi or wheeze  CV: HRRR, no peripheral edema or cyanosis, normal pedal pulses  ABDOMEN: bowel sounds normal, soft, non tender to palpation, no masses, no rebound or guarding  SKIN: no rash or abnormal lesions, good skin color - no pallor appreciated  GU/BREAST: declined  MS: normal gait, moves all extremities normally  NEURO: normal gait, speech and thought processing grossly intact, muscle tone grossly intact throughout  PSYCH: normal affect, pleasant and cooperative  ASSESSMENT AND PLAN:  Discussed the following assessment and plan:  Encounter for preventive health examination  Hyperlipidemia, unspecified hyperlipidemia type - Plan: Lipid panel -labs, lifestyle recs  Elevated blood pressure reading - Plan: Basic metabolic panel, CBC -opted to observe, labs -lifestyle recs  Hx of iron deficiency anemia - Plan: Ferritin  Hep c screen - Plan: Hepatitis C antibody  Recurrent major  depressive disorder, in full remission ([place]) -stable, refills provided  -Discussed and advised all [place] preventive services health task force level A and B recommendations for age, sex and risks.  -Advised at least 150 minutes of exercise per week and a healthy diet with avoidance of (less then 1 serving per week) processed foods, white starches, red meat, fast foods and sweets and consisting of: * 5-9 servings of fresh fruits and vegetables (not corn or potatoes) *nuts and seeds, beans *olives and olive oil *lean meats such as fish and white chicken  *whole grains  -FASTING labs, studies and vaccines per orders this encounter  Orders Placed This Encounter  Procedures  . Basic metabolic panel  . CBC  . Lipid panel  . Ferritin  . Hepatitis C antibody  Patient advised to return to clinic immediately if symptoms worsen or persist or new concerns.  Patient Instructions  BEFORE YOU LEAVE: -phq9 -labs -follow up: 6 months  Follow the breast center recommendations for your next mammogram.  We have ordered labs or studies at this visit. It can take up to 1-2 weeks for results and processing. IF results require follow up or explanation, we will call you with instructions. Clinically stable results will be released to your [facility]. If you have not heard from [place] or cannot find your results in [facility] in 2 weeks please contact our office at [phone].  If you are not yet signed up for [facility], please consider signing up.  We recommend the following healthy lifestyle for LIFE: 1) Small portions.   Tip: eat off of a salad plate instead of a dinner plate.  Tip: if you need more or a snack choose fruits, veggies and/or a handful of nuts or seeds.  2) Eat a healthy clean diet.  * Tip: Avoid (less then 1 serving per week): processed foods, sweets, sweetened drinks, white starches (rice, flour, bread, potatoes, pasta, etc), red meat, fast foods, butter  *Tip: CHOOSE instead   * 5-9  servings per day of fresh or frozen fruits and vegetables (but not corn, potatoes, bananas, canned or dried fruit)   *nuts and seeds, beans   *olives and olive oil   *small portions of lean meats such as fish and white chicken    *small portions of whole grains  3)Get at least 150 minutes of sweaty aerobic exercise per week.  4)Reduce stress - consider counseling, meditation and relaxation to balance other aspects of your life.     No Follow-up on file.  Colin Benton R., DO

## 2016-08-11 ENCOUNTER — Encounter: Payer: Self-pay | Admitting: Family Medicine

## 2016-08-11 ENCOUNTER — Ambulatory Visit (INDEPENDENT_AMBULATORY_CARE_PROVIDER_SITE_OTHER): Payer: BLUE CROSS/BLUE SHIELD | Admitting: Family Medicine

## 2016-08-11 VITALS — BP 120/80 | HR 99 | Temp 97.9°F | Ht 65.5 in | Wt 143.4 lb

## 2016-08-11 DIAGNOSIS — E785 Hyperlipidemia, unspecified: Secondary | ICD-10-CM

## 2016-08-11 DIAGNOSIS — R21 Rash and other nonspecific skin eruption: Secondary | ICD-10-CM | POA: Diagnosis not present

## 2016-08-11 DIAGNOSIS — F3342 Major depressive disorder, recurrent, in full remission: Secondary | ICD-10-CM | POA: Diagnosis not present

## 2016-08-11 DIAGNOSIS — G43009 Migraine without aura, not intractable, without status migrainosus: Secondary | ICD-10-CM | POA: Diagnosis not present

## 2016-08-11 DIAGNOSIS — Z7289 Other problems related to lifestyle: Secondary | ICD-10-CM | POA: Diagnosis not present

## 2016-08-11 DIAGNOSIS — R030 Elevated blood-pressure reading, without diagnosis of hypertension: Secondary | ICD-10-CM | POA: Diagnosis not present

## 2016-08-11 DIAGNOSIS — Z0000 Encounter for general adult medical examination without abnormal findings: Secondary | ICD-10-CM | POA: Diagnosis not present

## 2016-08-11 DIAGNOSIS — Z862 Personal history of diseases of the blood and blood-forming organs and certain disorders involving the immune mechanism: Secondary | ICD-10-CM | POA: Diagnosis not present

## 2016-08-11 DIAGNOSIS — K219 Gastro-esophageal reflux disease without esophagitis: Secondary | ICD-10-CM

## 2016-08-11 LAB — BASIC METABOLIC PANEL
BUN: 17 mg/dL (ref 6–23)
CHLORIDE: 105 meq/L (ref 96–112)
CO2: 30 mEq/L (ref 19–32)
Calcium: 10 mg/dL (ref 8.4–10.5)
Creatinine, Ser: 0.98 mg/dL (ref 0.40–1.20)
GFR: 60.77 mL/min (ref >60.00)
Glucose, Bld: 92 mg/dL (ref 70–99)
POTASSIUM: 4.8 meq/L (ref 3.5–5.1)
SODIUM: 141 meq/L (ref 135–145)

## 2016-08-11 LAB — CBC
HEMATOCRIT: 44.1 % (ref 36.0–46.0)
HEMOGLOBIN: 15 g/dL (ref 12.0–15.0)
MCHC: 34 g/dL (ref 30.0–36.0)
MCV: 93.5 fl (ref 78.0–100.0)
PLATELETS: 300 10*3/uL (ref 150.0–400.0)
RBC: 4.72 Mil/uL (ref 3.87–5.11)
RDW: 12.9 % (ref 11.5–15.5)
WBC: 3.4 10*3/uL — AB (ref 4.0–10.5)

## 2016-08-11 LAB — LIPID PANEL
CHOL/HDL RATIO: 3
Cholesterol: 203 mg/dL — ABNORMAL HIGH (ref 0–200)
HDL: 73.3 mg/dL (ref >39.00)
LDL CALC: 115 mg/dL — AB (ref 0–99)
NONHDL: 129.74
Triglycerides: 75 mg/dL (ref 0.0–149.0)
VLDL: 15 mg/dL (ref 0.0–40.0)

## 2016-08-11 LAB — FERRITIN: FERRITIN: 41.1 ng/mL (ref 10.0–291.0)

## 2016-08-11 MED ORDER — SUMATRIPTAN SUCCINATE 100 MG PO TABS: ORAL_TABLET | ORAL | 2 refills | Status: DC

## 2016-08-11 MED ORDER — PANTOPRAZOLE SODIUM 40 MG PO TBEC: 40.0000 mg | DELAYED_RELEASE_TABLET | Freq: Every day | ORAL | 5 refills | Status: DC

## 2016-08-11 MED ORDER — TRETINOIN (EMOLLIENT) 0.05 % EX CREA: TOPICAL_CREAM | CUTANEOUS | 1 refills | Status: DC

## 2016-08-11 MED ORDER — BUPROPION HCL ER (SR) 150 MG PO TB12: 150.0000 mg | ORAL_TABLET | Freq: Two times a day (BID) | ORAL | 5 refills | Status: DC

## 2016-08-11 NOTE — Patient Instructions (Addendum)
BEFORE YOU LEAVE: -phq9 -labs -follow up: 6 months  Follow the breast center recommendations for your next mammogram.  We have ordered labs or studies at this visit. It can take up to 1-2 weeks for results and processing. IF results require follow up or explanation, we will call you with instructions. Clinically stable results will be released to your [facility]. If you have not heard from [place] or cannot find your results in [facility] in 2 weeks please contact our office at [phone].  If you are not yet signed up for [facility], please consider signing up.  We recommend the following healthy lifestyle for LIFE: 1) Small portions.   Tip: eat off of a salad plate instead of a dinner plate.  Tip: if you need more or a snack choose fruits, veggies and/or a handful of nuts or seeds.  2) Eat a healthy clean diet.  * Tip: Avoid (less then 1 serving per week): processed foods, sweets, sweetened drinks, white starches (rice, flour, bread, potatoes, pasta, etc), red meat, fast foods, butter  *Tip: CHOOSE instead   * 5-9 servings per day of fresh or frozen fruits and vegetables (but not corn, potatoes, bananas, canned or dried fruit)   *nuts and seeds, beans   *olives and olive oil   *small portions of lean meats such as fish and white chicken    *small portions of whole grains  3)Get at least 150 minutes of sweaty aerobic exercise per week.  4)Reduce stress - consider counseling, meditation and relaxation to balance other aspects of your life.

## 2016-08-11 NOTE — Addendum Note (Signed)
Addended by: Agnes Lawrence on: 08/11/2016 08:54 AM   Modules accepted: Orders

## 2016-08-12 ENCOUNTER — Other Ambulatory Visit: Payer: Self-pay | Admitting: Family Medicine

## 2016-08-12 ENCOUNTER — Other Ambulatory Visit: Payer: Self-pay | Admitting: Internal Medicine

## 2016-08-12 LAB — HEPATITIS C ANTIBODY: HCV AB: NEGATIVE

## 2016-08-16 ENCOUNTER — Other Ambulatory Visit: Payer: Self-pay | Admitting: Internal Medicine

## 2016-09-12 ENCOUNTER — Encounter: Payer: Self-pay | Admitting: Family Medicine

## 2016-09-12 ENCOUNTER — Ambulatory Visit (INDEPENDENT_AMBULATORY_CARE_PROVIDER_SITE_OTHER): Payer: BLUE CROSS/BLUE SHIELD | Admitting: Family Medicine

## 2016-09-12 VITALS — BP 140/90 | HR 68 | Wt 143.0 lb

## 2016-09-12 DIAGNOSIS — M5030 Other cervical disc degeneration, unspecified cervical region: Secondary | ICD-10-CM | POA: Diagnosis not present

## 2016-09-12 DIAGNOSIS — M999 Biomechanical lesion, unspecified: Secondary | ICD-10-CM | POA: Diagnosis not present

## 2016-09-12 NOTE — Assessment & Plan Note (Signed)
Degenerative changes noted. Continuing core strength and stability. Continue same medications. Follow-up 6 weeks

## 2016-09-12 NOTE — Assessment & Plan Note (Signed)
Decision today to treat with OMT was based on Physical Exam  After verbal consent patient was treated with HVLA, ME, FPR techniques in cervical, thoracic, rib areas  Patient tolerated the procedure well with improvement in symptoms  Patient given exercises, stretches and lifestyle modifications  See medications in patient instructions if given  Patient will follow up in 6 weeks 

## 2016-09-12 NOTE — Progress Notes (Signed)
[name] [address], [place] [address] Phone: [phone] Subjective:    I'm seeing this patient by the request  of:    CC: Neck pain follow-up  [id number]  Lindsay [name] is a 66 y.o. female coming in with complaint of neck pain. Patient has been seen for this multiple times. Patient has had advance imaging showing severe osteophytic changes with some spinal stenosis. Has had some beneficial improvement with an epidural nearly 10 months ago at this point. Has responded fairly well to manipulation. Continues to do the home exercises intermittently. Continues to have intermittent numbness of the fingers.     Past Medical History:  Diagnosis Date  . COMMON MIGRAINE 09/20/2006  . DEPRESSION 06/11/2009  . GERD 06/11/2009  . Neck pain    eval with sports medicine in 2016/17   Past Surgical History:  Procedure Laterality Date  . ABDOMINAL HYSTERECTOMY    . CHOLECYSTECTOMY    . OVARIAN CYST REMOVAL    . REPAIR EXTENSOR TENDON HAND    . TONSILLECTOMY     Social History   Social History  . Marital status: Married    Spouse name: N/A  . Number of children: N/A  . Years of education: N/A   Social History Main Topics  . Smoking status: Former Smoker    Types: Cigarettes    Quit date: 04/04/2004  . Smokeless tobacco: Never Used  . Alcohol use 0.0 oz/week     Comment: occ  . Drug use: No  . Sexual activity: Not Asked   Other Topics Concern  . None   Social History Narrative  . None   No Known Allergies Family History  Problem Relation Age of Onset  . Dementia Mother   . Osteoporosis Mother   . Breast cancer Sister   . Heart attack Brother   . Colon cancer Neg Hx   . Esophageal cancer Neg Hx   . Rectal cancer Neg Hx   . Stomach cancer Neg Hx     Past medical history, social, surgical and family history all reviewed in electronic medical record.  No pertanent information unless stated regarding to the chief complaint.    Review of Systems:Review of systems updated and as accurate as of 09/12/16  No headache, visual changes, nausea, vomiting, diarrhea, constipation, dizziness, abdominal pain, skin rash, fevers, chills, night sweats, weight loss, swollen lymph nodes, body aches, joint swelling, chest pain, shortness of breath, mood changes. Positive muscle aches  Objective  Blood pressure 140/90, pulse 68, weight 143 lb (64.9 kg). Systems examined below as of 09/12/16   General: No apparent distress alert and oriented x3 mood and affect normal, dressed appropriately.  HEENT: Pupils equal, extraocular movements intact  Respiratory: Patient's speak in full sentences and does not appear short of breath  Cardiovascular: No lower extremity edema, non tender, no erythema  Skin: Warm dry intact with no signs of infection or rash on extremities or on axial skeleton.  Abdomen: Soft nontender  Neuro: Cranial nerves II through XII are intact, neurovascularly intact in all extremities with 2+ DTRs and 2+ pulses.  Lymph: No lymphadenopathy of posterior or anterior cervical chain or axillae bilaterally.  Gait normal with good balance and coordination.  MSK:  Non tender with full range of motion and good stability and symmetric strength and tone of shoulders, elbows, wrist, hip, knee and ankles bilaterally.  Neck: Inspection loss of lordosis. No palpable stepoffs. Negative Spurling's maneuver. Mild limitation in  extension as well as side bending bilaterally Grip strength and sensation normal in bilateral hands Strength good C4 to T1 distribution No sensory change to C4 to T1 Negative Hoffman sign bilaterally Reflexes normal  Osteopathic findings C2 flexed rotated and side bent right C4 flexed rotated and side bent left C6 flexed rotated and side bent left T3 extended rotated and side bent right inhaled third rib T6 extended rotated and side bent left L2 flexed rotated and side bent right      Impression and  Recommendations:     This case required medical decision making of moderate complexity.      Note: This dictation was prepared with Dragon dictation along with smaller phrase technology. Any transcriptional errors that result from this process are unintentional.

## 2016-09-12 NOTE — Patient Instructions (Signed)
heel lift in your shoe.  For the the achilles try the pennsaid pinkie amount topically 2 times daily as needed.   For the knee wear your son's brace.  Patella subluxation.  Ice if you need it Leticia Clas, learn to give in please  See me again in 4-6 weeks

## 2016-10-20 ENCOUNTER — Ambulatory Visit: Payer: BLUE CROSS/BLUE SHIELD | Admitting: Family Medicine

## 2016-11-15 ENCOUNTER — Ambulatory Visit: Payer: BLUE CROSS/BLUE SHIELD | Admitting: Family Medicine

## 2016-11-16 ENCOUNTER — Ambulatory Visit (INDEPENDENT_AMBULATORY_CARE_PROVIDER_SITE_OTHER): Payer: BLUE CROSS/BLUE SHIELD | Admitting: Family Medicine

## 2016-11-16 ENCOUNTER — Encounter: Payer: Self-pay | Admitting: Family Medicine

## 2016-11-16 VITALS — BP 112/80 | HR 71 | Ht 65.5 in | Wt 142.0 lb

## 2016-11-16 DIAGNOSIS — M5412 Radiculopathy, cervical region: Secondary | ICD-10-CM

## 2016-11-16 DIAGNOSIS — M999 Biomechanical lesion, unspecified: Secondary | ICD-10-CM

## 2016-11-16 DIAGNOSIS — M5030 Other cervical disc degeneration, unspecified cervical region: Secondary | ICD-10-CM | POA: Diagnosis not present

## 2016-11-16 NOTE — Assessment & Plan Note (Signed)
Degenerative several days. Discussed with patient at great length. We discussed icing regimen and home exercises. Continue the same regimen. Patient has not been taking her medication regularly. Patient will be set up low with having worsening radicular symptoms with a epidural steroid injection. Had responded well before. Patient will start this. Follow-up again with me in 3-4 weeks

## 2016-11-16 NOTE — Progress Notes (Signed)
[id number]

## 2016-11-16 NOTE — Progress Notes (Signed)
[name] [address], [place] [address] Phone: [phone] Subjective:    I'm seeing this patient by the request  of:    CC: Neck pain follow-up  [id number]  Lindsay [name] is a 66 y.o. female coming in with complaint of neck pain. Patient has been seen for this multiple times. Patient has had advance imaging showing severe osteophytic changes with some spinal stenosis.  Patient is having worsening symptoms. Patient is having radicular symptoms and some mild weakness of the hands. Patient is concerned. Feels like she may need another epidural. nearly 1 year. Starting affect daily activities and waking her up at night.     Past Medical History:  Diagnosis Date  . COMMON MIGRAINE 09/20/2006  . DEPRESSION 06/11/2009  . GERD 06/11/2009  . Neck pain    eval with sports medicine in 2016/17   Past Surgical History:  Procedure Laterality Date  . ABDOMINAL HYSTERECTOMY    . CHOLECYSTECTOMY    . OVARIAN CYST REMOVAL    . REPAIR EXTENSOR TENDON HAND    . TONSILLECTOMY     Social History   Social History  . Marital status: Married    Spouse name: N/A  . Number of children: N/A  . Years of education: N/A   Social History Main Topics  . Smoking status: Former Smoker    Types: Cigarettes    Quit date: 04/04/2004  . Smokeless tobacco: Never Used  . Alcohol use 0.0 oz/week     Comment: occ  . Drug use: No  . Sexual activity: Not Asked   Other Topics Concern  . None   Social History Narrative  . None   No Known Allergies Family History  Problem Relation Age of Onset  . Dementia Mother   . Osteoporosis Mother   . Breast cancer Sister   . Heart attack Brother   . Colon cancer Neg Hx   . Esophageal cancer Neg Hx   . Rectal cancer Neg Hx   . Stomach cancer Neg Hx     Past medical history, social, surgical and family history all reviewed in electronic medical record.  No pertanent information unless stated regarding to the chief  complaint.   Review of Systems: No headache, visual changes, nausea, vomiting, diarrhea, constipation, dizziness, abdominal pain, skin rash, fevers, chills, night sweats, weight loss, swollen lymph nodes, body aches, joint swelling, chest pain, shortness of breath, mood changes.  Positive muscle aches  Objective  Blood pressure 112/80, height 5' 5.5" (1.664 m), weight 142 lb (64.4 kg). Systems examined below as of 11/16/16   Systems examined below as of 11/16/16 General: NAD A&O x3 mood, affect normal  HEENT: Pupils equal, extraocular movements intact no nystagmus Respiratory: not short of breath at rest or with speaking Cardiovascular: No lower extremity edema, non tender Skin: Warm dry intact with no signs of infection or rash on extremities or on axial skeleton. Abdomen: Soft nontender, no masses Neuro: Cranial nerves  intact, neurovascularly intact in all extremities with 2+ DTRs and 2+ pulses. Lymph: No lymphadenopathy appreciated today  Gait normal with good balance and coordination.  MSK: Non tender with full range of motion and good stability and symmetric strength and tone of shoulders, elbows, wrist,  knee hips and ankles bilaterally.   Neck: Inspection loss of lordosis. No palpable stepoffs. Positive Spurling's maneuver. Worsening decreased range of motion lacking the last 15 of extension and last 10 of side bending bilaterally Strength good C4  to T1 distribution No sensory change to C4 to T1 Negative Hoffman sign bilaterally Reflexes normal  Osteopathic findings C2 flexed rotated and side bent right C4 flexed rotated and side bent left C6 flexed rotated and side bent left C6 flexed rotated and side bent right T3 extended rotated and side bent right inhaled third rib T9 extended rotated and side bent left     Impression and Recommendations:     This case required medical decision making of moderate complexity.      Note: This dictation was prepared with  Dragon dictation along with smaller phrase technology. Any transcriptional errors that result from this process are unintentional.

## 2016-11-16 NOTE — Assessment & Plan Note (Signed)
Decision today to treat with OMT was based on Physical Exam  After verbal consent patient was treated with HVLA, ME, FPR techniques in cervical, thoracic, rib areas  Patient tolerated the procedure well with improvement in symptoms  Patient given exercises, stretches and lifestyle modifications  See medications in patient instructions if given  Patient will follow up in 3-4 weeks 

## 2016-11-16 NOTE — Patient Instructions (Signed)
3-4 weeks  COnsider gabapentin

## 2016-12-08 ENCOUNTER — Ambulatory Visit (INDEPENDENT_AMBULATORY_CARE_PROVIDER_SITE_OTHER): Payer: BLUE CROSS/BLUE SHIELD | Admitting: Family Medicine

## 2016-12-08 ENCOUNTER — Encounter: Payer: Self-pay | Admitting: Family Medicine

## 2016-12-08 VITALS — BP 124/88 | HR 69 | Ht 60.0 in | Wt 142.0 lb

## 2016-12-08 DIAGNOSIS — M5030 Other cervical disc degeneration, unspecified cervical region: Secondary | ICD-10-CM

## 2016-12-08 DIAGNOSIS — M999 Biomechanical lesion, unspecified: Secondary | ICD-10-CM | POA: Diagnosis not present

## 2016-12-08 NOTE — Progress Notes (Signed)
[name] [address] [place], [place] [address] Phone: [phone] Subjective:    I'm seeing this patient by the request  of:    CC: Neck pain follow-up  [id number]  Lindsay [name] is a 66 y.o. female coming in with complaint of neck pain. Patient has been seen for this multiple times. Patient has had advance imaging showing severe osteophytic changes with some spinal stenosis.   Patient was having worsening symptoms and seems something unstable. Patient though is scheduled to have an epidural Area and continues to have some numbness in the fingertips. Seems to be mostly in the C7 distribution. Patient has had good response to the epidurals previously. Patient also does fairly well with the supine manipulation. Patient denies any new symptoms such as weakness.       Past Medical History:  Diagnosis Date  . COMMON MIGRAINE 09/20/2006  . DEPRESSION 06/11/2009  . GERD 06/11/2009  . Neck pain    eval with sports medicine in 2016/17   Past Surgical History:  Procedure Laterality Date  . ABDOMINAL HYSTERECTOMY    . CHOLECYSTECTOMY    . OVARIAN CYST REMOVAL    . REPAIR EXTENSOR TENDON HAND    . TONSILLECTOMY     Social History   Social History  . Marital status: Married    Spouse name: N/A  . Number of children: N/A  . Years of education: N/A   Social History Main Topics  . Smoking status: Former Smoker    Types: Cigarettes    Quit date: 04/04/2004  . Smokeless tobacco: Never Used  . Alcohol use 0.0 oz/week     Comment: occ  . Drug use: No  . Sexual activity: Not Asked   Other Topics Concern  . None   Social History Narrative  . None   No Known Allergies Family History  Problem Relation Age of Onset  . Dementia Mother   . Osteoporosis Mother   . Breast cancer Sister   . Heart attack Brother   . Colon cancer Neg Hx   . Esophageal cancer Neg Hx   . Rectal cancer Neg Hx   . Stomach cancer Neg Hx     Past medical history,  social, surgical and family history all reviewed in electronic medical record.  No pertanent information unless stated regarding to the chief complaint.   Review of Systems: No headache, visual changes, nausea, vomiting, diarrhea, constipation, dizziness, abdominal pain, skin rash, fevers, chills, night sweats, weight loss, swollen lymph nodes, body aches, joint swelling, muscle aches, chest pain, shortness of breath, mood changes.    Objective  Blood pressure 124/88, pulse 69, height 5' (1.524 m), weight 142 lb (64.4 kg), SpO2 98 %.   Systems examined below as of 12/08/16 General: NAD A&O x3 mood, affect normal  HEENT: Pupils equal, extraocular movements intact no nystagmus Respiratory: not short of breath at rest or with speaking Cardiovascular: No lower extremity edema, non tender Skin: Warm dry intact with no signs of infection or rash on extremities or on axial skeleton. Abdomen: Soft nontender, no masses Neuro: Cranial nerves  intact, neurovascularly intact in all extremities with 2+ DTRs and 2+ pulses. Lymph: No lymphadenopathy appreciated today  Gait normal with good balance and coordination.  MSK: Non tender with full range of motion and good stability and symmetric strength and tone of shoulders, elbows, wrist,  knee hips and ankles bilaterally.   Neck: Inspection unremarkable. No palpable stepoffs. Mild positive Spurling's maneuver.  I last 10 of extension. Grip strength and sensation normal in bilateral hands Strength good C4 to T1 distribution No sensory change to C4 to T1 Negative Hoffman sign bilaterally Reflexes normal  Osteopathic findings C2 flexed rotated and side bent right  C7 flexed rotated and side bent left T3 extended rotated and side bent right inhaled third rib       Impression and Recommendations:     This case required medical decision making of moderate complexity.      Note: This dictation was prepared with Dragon dictation along with  smaller phrase technology. Any transcriptional errors that result from this process are unintentional.

## 2016-12-08 NOTE — Patient Instructions (Signed)
Good to see you  Lindsay [name] is your friend.  Lets see how you do  See me again in 3-4 weeks.

## 2016-12-08 NOTE — Assessment & Plan Note (Signed)
Decision today to treat with OMT was based on Physical Exam  After verbal consent patient was treated with HVLA, ME, FPR techniques in cervical, thoracic, rib areas  Patient tolerated the procedure well with improvement in symptoms  Patient given exercises, stretches and lifestyle modifications  See medications in patient instructions if given  Patient will follow up in 3-4 weeks 

## 2016-12-08 NOTE — Assessment & Plan Note (Signed)
Patient is scheduled to have another epidural. Minimal thing that this will be beneficial. Attempted osteopathic manipulation again and used more of a low amplitude. No high velocity. We discussed icing regimen, continue the medications. Discussed other different medication choices which patient declined.

## 2016-12-09 ENCOUNTER — Ambulatory Visit
Admission: RE | Admit: 2016-12-09 | Discharge: 2016-12-09 | Disposition: A | Discharge status: Home / Self Care | Payer: BLUE CROSS/BLUE SHIELD | Source: Ambulatory Visit | Attending: Family Medicine | Admitting: Family Medicine

## 2016-12-09 DIAGNOSIS — M5412 Radiculopathy, cervical region: Secondary | ICD-10-CM

## 2016-12-09 MED ORDER — IOPAMIDOL (ISOVUE-M 300) INJECTION 61%
1.0000 mL | Freq: Once | INTRAMUSCULAR | Status: AC | PRN
  Administered 2016-12-09: 1 mL via EPIDURAL

## 2016-12-09 MED ORDER — TRIAMCINOLONE ACETONIDE 40 MG/ML IJ SUSP (RADIOLOGY)
60.0000 mg | Freq: Once | INTRAMUSCULAR | Status: AC
  Administered 2016-12-09: 60 mg via EPIDURAL

## 2016-12-09 NOTE — Discharge Instructions (Signed)

## 2016-12-13 ENCOUNTER — Other Ambulatory Visit: Payer: BLUE CROSS/BLUE SHIELD

## 2016-12-22 ENCOUNTER — Encounter: Payer: Self-pay | Admitting: Family Medicine

## 2017-01-08 NOTE — Progress Notes (Signed)
[name] [address] [place], [place] [address] Phone: [phone] Subjective:     CC: Neck pain follow-up  [id number]  Lindsay [name] is a 66 y.o. female coming in with complaint of neck pain follow-up. Has been known to have degenerative disc disease as well as some cervical radiculopathy. Has responded to epidurals previously. Last epidural was 12/09/2016. Patient was to continue all other conservative therapy. Patient states This did not make as much different.      Past Medical History:  Diagnosis Date  . COMMON MIGRAINE 09/20/2006  . DEPRESSION 06/11/2009  . GERD 06/11/2009  . Neck pain    eval with sports medicine in 2016/17   Past Surgical History:  Procedure Laterality Date  . ABDOMINAL HYSTERECTOMY    . CHOLECYSTECTOMY    . OVARIAN CYST REMOVAL    . REPAIR EXTENSOR TENDON HAND    . TONSILLECTOMY     Social History   Social History  . Marital status: Married    Spouse name: N/A  . Number of children: N/A  . Years of education: N/A   Social History Main Topics  . Smoking status: Former Smoker    Types: Cigarettes    Quit date: 04/04/2004  . Smokeless tobacco: Never Used  . Alcohol use 0.0 oz/week     Comment: occ  . Drug use: No  . Sexual activity: Not Asked   Other Topics Concern  . None   Social History Narrative  . None   No Known Allergies Family History  Problem Relation Age of Onset  . Dementia Mother   . Osteoporosis Mother   . Breast cancer Sister   . Heart attack Brother   . Colon cancer Neg Hx   . Esophageal cancer Neg Hx   . Rectal cancer Neg Hx   . Stomach cancer Neg Hx      Past medical history, social, surgical and family history all reviewed in electronic medical record.  No pertanent information unless stated regarding to the chief complaint.   Review of Systems:Review of systems updated and as accurate as of 01/09/17  No headache, visual changes, nausea, vomiting, diarrhea, constipation,  dizziness, abdominal pain, skin rash, fevers, chills, night sweats, weight loss, swollen lymph nodes, body aches, joint swelling, muscle aches, chest pain, shortness of breath, mood changes.   Objective  Blood pressure 120/90, pulse 61, height 5\' 6"  (1.676 m), weight 144 lb (65.3 kg), SpO2 93 %. Systems examined below as of 01/09/17   General: No apparent distress alert and oriented x3 mood and affect normal, dressed appropriately.  HEENT: Pupils equal, extraocular movements intact  Respiratory: Patient's speak in full sentences and does not appear short of breath  Cardiovascular: No lower extremity edema, non tender, no erythema  Skin: Warm dry intact with no signs of infection or rash on extremities or on axial skeleton.  Abdomen: Soft nontender  Neuro: Cranial nerves II through XII are intact, neurovascularly intact in all extremities with 2+ DTRs and 2+ pulses.  Lymph: No lymphadenopathy of posterior or anterior cervical chain or axillae bilaterally.  Gait normal with good balance and coordination.  MSK:  Non tender with full range of motion and good stability and symmetric strength and tone of shoulders, elbows, wrist, hip, knee and ankles bilaterally.  Neck: Inspection Loss of lordosis. No palpable stepoffs. Negative Spurling's maneuver. Still mild limited range of motion in all planes especially side bending bilaterally Grip strength and sensation normal in bilateral  hands Strength good C4 to T1 distribution No sensory change to C4 to T1 Negative Hoffman sign bilaterally Reflexes normal  Osteopathic findings C2 flexed rotated and side bent right C7 flexed rotated and side bent left T3 extended rotated and side bent right inhaled third rib T8 extended rotated and side bent left L2 flexed rotated and side bent right Sacrum right on right    Impression and Recommendations:     This case required medical decision making of moderate complexity.      Note: This dictation  was prepared with Dragon dictation along with smaller phrase technology. Any transcriptional errors that result from this process are unintentional.

## 2017-01-09 ENCOUNTER — Encounter: Payer: Self-pay | Admitting: Family Medicine

## 2017-01-09 ENCOUNTER — Ambulatory Visit (INDEPENDENT_AMBULATORY_CARE_PROVIDER_SITE_OTHER): Payer: BLUE CROSS/BLUE SHIELD | Admitting: Family Medicine

## 2017-01-09 VITALS — BP 120/90 | HR 61 | Ht 66.0 in | Wt 144.0 lb

## 2017-01-09 DIAGNOSIS — M999 Biomechanical lesion, unspecified: Secondary | ICD-10-CM

## 2017-01-09 DIAGNOSIS — M5030 Other cervical disc degeneration, unspecified cervical region: Secondary | ICD-10-CM

## 2017-01-09 NOTE — Patient Instructions (Signed)
Good to see you  Lindsay [name] is your friend.  Gabapentin at night See me again in 4 weeks.

## 2017-01-09 NOTE — Assessment & Plan Note (Addendum)
Decision today to treat with OMT was based on Physical Exam  After verbal consent patient was treated with HVLA, ME, FPR techniques in cervical, thoracic, rib, lumbar and sacral areas  Patient tolerated the procedure well with improvement in symptoms  Patient given exercises, stretches and lifestyle modifications  See medications in patient instructions if given  Patient will follow up in 4 weeks 

## 2017-01-09 NOTE — Assessment & Plan Note (Signed)
Patient does have degenerative disc disease. Did not respond as well to the epidural as we have seen previously. Discussed with patient about potentially using her other medications on a more regular basis. Patient has been fairly noncompliant in this. Has done fairly well with osteopathic manipulation though. Continue in 4 week intervals otherwise. Continue the home exercises on a more religious basis.

## 2017-01-26 ENCOUNTER — Encounter: Payer: Self-pay | Admitting: Family Medicine

## 2017-01-26 MED ORDER — GABAPENTIN 100 MG PO CAPS: 200.0000 mg | ORAL_CAPSULE | Freq: Every day | ORAL | 3 refills | Status: DC

## 2017-02-06 ENCOUNTER — Encounter: Payer: Self-pay | Admitting: Family Medicine

## 2017-02-06 ENCOUNTER — Ambulatory Visit (INDEPENDENT_AMBULATORY_CARE_PROVIDER_SITE_OTHER): Payer: BLUE CROSS/BLUE SHIELD | Admitting: Family Medicine

## 2017-02-06 ENCOUNTER — Ambulatory Visit: Payer: BLUE CROSS/BLUE SHIELD | Admitting: Family Medicine

## 2017-02-06 VITALS — BP 150/90 | HR 68 | Ht 66.0 in | Wt 145.0 lb

## 2017-02-06 DIAGNOSIS — M999 Biomechanical lesion, unspecified: Secondary | ICD-10-CM | POA: Diagnosis not present

## 2017-02-06 DIAGNOSIS — M5030 Other cervical disc degeneration, unspecified cervical region: Secondary | ICD-10-CM

## 2017-02-06 MED ORDER — VENLAFAXINE HCL ER 37.5 MG PO CP24: 37.5000 mg | ORAL_CAPSULE | Freq: Every day | ORAL | 1 refills | Status: DC

## 2017-02-06 NOTE — Progress Notes (Signed)
[name] [address] [place], [place] [address] Phone: [phone] Subjective:    I'm seeing this patient by the request  of:    CC: Neck pain follow-up  [id number]  Lindsay [name] is a 66 y.o. female coming in with complaint of pain.  Patient has had neck pain for some time.  Has cervical degenerative disc disease.  Patient has responded fairly well to conservative therapy.  Patient has had epidurals as well.  States that the small amount of increase in medication has helped.  Patient does continues to have some discomfort and some radicular symptoms.      Past Medical History:  Diagnosis Date  . COMMON MIGRAINE 09/20/2006  . DEPRESSION 06/11/2009  . GERD 06/11/2009  . Neck pain    eval with sports medicine in 2016/17   Past Surgical History:  Procedure Laterality Date  . ABDOMINAL HYSTERECTOMY    . CHOLECYSTECTOMY    . OVARIAN CYST REMOVAL    . REPAIR EXTENSOR TENDON HAND    . TONSILLECTOMY     Social History   Socioeconomic History  . Marital status: Married    Spouse name: None  . Number of children: None  . Years of education: None  . Highest education level: None  Social Needs  . Financial resource strain: None  . Food insecurity - worry: None  . Food insecurity - inability: None  . Transportation needs - medical: None  . Transportation needs - non-medical: None  Occupational History  . None  Tobacco Use  . Smoking status: Former Smoker    Types: Cigarettes    Last attempt to quit: 04/04/2004    Years since quitting: 12.8  . Smokeless tobacco: Never Used  Substance and Sexual Activity  . Alcohol use: Yes    Alcohol/week: 0.0 oz    Comment: occ  . Drug use: No  . Sexual activity: None  Other Topics Concern  . None  Social History Narrative  . None   No Known Allergies Family History  Problem Relation Age of Onset  . Dementia Mother   . Osteoporosis Mother   . Breast cancer Sister   . Heart attack Brother   .  Colon cancer Neg Hx   . Esophageal cancer Neg Hx   . Rectal cancer Neg Hx   . Stomach cancer Neg Hx      Past medical history, social, surgical and family history all reviewed in electronic medical record.  No pertanent information unless stated regarding to the chief complaint.   Review of Systems:Review of systems updated and as accurate as of 02/06/17  No headache, visual changes, nausea, vomiting, diarrhea, constipation, dizziness, abdominal pain, skin rash, fevers, chills, night sweats, weight loss, swollen lymph nodes, body aches, joint swelling, chest pain, shortness of breath, mood changes.  Muscle aches  Objective  Blood pressure (!) 150/90, pulse 68, height 5\' 6"  (1.676 m), weight 145 lb (65.8 kg), SpO2 96 %. Systems examined below as of 02/06/17   General: No apparent distress alert and oriented x3 mood and affect normal, dressed appropriately.  HEENT: Pupils equal, extraocular movements intact  Respiratory: Patient's speak in full sentences and does not appear short of breath  Cardiovascular: No lower extremity edema, non tender, no erythema  Skin: Warm dry intact with no signs of infection or rash on extremities or on axial skeleton.  Abdomen: Soft nontender  Neuro: Cranial nerves II through XII are intact, neurovascularly intact in all  extremities with 2+ DTRs and 2+ pulses.  Lymph: No lymphadenopathy of posterior or anterior cervical chain or axillae bilaterally.  Gait normal with good balance and coordination.  MSK:  Non tender with full range of motion and good stability and symmetric strength and tone of shoulders, elbows, wrist, hip, knee and ankles bilaterally.  Neck: Inspection loss of lordosis. No palpable stepoffs. Negative Spurling's maneuver. Mild loss of range of motion lacking left 5 degrees of extension and sidebending bilaterally Grip strength and sensation normal in bilateral hands Strength good C4 to T1 distribution No sensory change to C4 to  T1 Negative Hoffman sign bilaterally Reflexes normal  Osteopathic findings C2 flexed rotated and side bent right C4 flexed rotated and side bent left C7 flexed rotated and side bent left T3 extended rotated and side bent right inhaled third rib T6 extended rotated and side bent left L2 flexed rotated and side bent right Sacrum right on right    Impression and Recommendations:     This case required medical decision making of moderate complexity.      Note: This dictation was prepared with Dragon dictation along with smaller phrase technology. Any transcriptional errors that result from this process are unintentional.

## 2017-02-06 NOTE — Assessment & Plan Note (Signed)
Decision today to treat with OMT was based on Physical Exam  After verbal consent patient was treated with HVLA, ME, FPR techniques in cervical, thoracic, rib, lumbar and sacral areas  Patient tolerated the procedure well with improvement in symptoms  Patient given exercises, stretches and lifestyle modifications  See medications in patient instructions if given  Patient will follow up in 4 weeks 

## 2017-02-06 NOTE — Assessment & Plan Note (Signed)
Patient is doing relatively well.  We may need adjustments.  Patient started on Effexor.  Will consider titrating patient off of the Wellbutrin slowly.  We discussed icing regimen and home exercises.  We will see how patient does and will follow-up again in 4 weeks.

## 2017-02-06 NOTE — Patient Instructions (Signed)
Good to see you  effexor 37.5 mg daily  Continue the wellbutrin once a day for 2 weeks and then can stop if you want See me again in 4 weeks

## 2017-02-07 ENCOUNTER — Encounter: Payer: Self-pay | Admitting: Family Medicine

## 2017-02-09 ENCOUNTER — Other Ambulatory Visit: Payer: Self-pay | Admitting: Family Medicine

## 2017-02-13 ENCOUNTER — Other Ambulatory Visit: Payer: Self-pay

## 2017-02-27 ENCOUNTER — Other Ambulatory Visit: Payer: Self-pay | Admitting: Family Medicine

## 2017-02-27 ENCOUNTER — Encounter: Payer: Self-pay | Admitting: Family Medicine

## 2017-02-27 DIAGNOSIS — Z1231 Encounter for screening mammogram for malignant neoplasm of breast: Secondary | ICD-10-CM

## 2017-03-02 NOTE — Progress Notes (Addendum)
HPI:  Here for follow-up.  She feels like her blood pressure has been doing okay.  She absolutely does not want to take medications for blood pressure.  No chest pain, shortness of breath dyspnea on exertion or swelling.  She does have some tingling and pain in her thenar eminence and first 2 fingers and half of her third finger in both hands bilaterally for years.  This is worse at night and when she wakes up in the morning.  She does a lot of typing and work with her hands and wrists.  She thought this was coming from her neck.  She sees a sports medicine doctor and a spine specialist.  No dropping things or significant weakness.  She also has a small lump on the right anterior shin that she went me to look at.  Mood is good, she wants a refill on her Wellbutrin.  See PHQ 9.  Elevated BP: -Not on medications for this as she preferred to try lifestyle changes management  GERD: -uses PPI sometimes for chronic GERD  -rare dysphagia, refused GI evaluation in the past,  -hx dilation of esophagus remotely -sstable  Migraines: -Uses prn triptan  Depression and stress: Chronic depression, treated with Wellbutrin Reports mood good  No significant anxiety, panic disorder, manic symptoms, thoughts of self-harm, worsening depressed mood or psychosis.  Mild hyperlipidemia: -No regular exercise -Reports a healthy diet  Degenerative disc disease: -See sports medicine and spine specialist, had MRI this year with signal cervical spine -Specialists trying gabapentin for symptomatic management  ROS: See pertinent positives and negatives per HPI.  Past Medical History:  Diagnosis Date  . COMMON MIGRAINE 09/20/2006  . DEPRESSION 06/11/2009  . GERD 06/11/2009  . Neck pain    eval with sports medicine in 2016/17    Past Surgical History:  Procedure Laterality Date  . ABDOMINAL HYSTERECTOMY    . CHOLECYSTECTOMY    . OVARIAN CYST REMOVAL    . REPAIR EXTENSOR TENDON HAND    . TONSILLECTOMY       Family History  Problem Relation Age of Onset  . Dementia Mother   . Osteoporosis Mother   . Breast cancer Sister   . Heart attack Brother   . Colon cancer Neg Hx   . Esophageal cancer Neg Hx   . Rectal cancer Neg Hx   . Stomach cancer Neg Hx     Social History   Socioeconomic History  . Marital status: Married    Spouse name: None  . Number of children: None  . Years of education: None  . Highest education level: None  Social Needs  . Financial resource strain: None  . Food insecurity - worry: None  . Food insecurity - inability: None  . Transportation needs - medical: None  . Transportation needs - non-medical: None  Occupational History  . None  Tobacco Use  . Smoking status: Former Smoker    Types: Cigarettes    Last attempt to quit: 04/04/2004    Years since quitting: 12.9  . Smokeless tobacco: Never Used  Substance and Sexual Activity  . Alcohol use: Yes    Alcohol/week: 0.0 oz    Comment: occ  . Drug use: No  . Sexual activity: None  Other Topics Concern  . None  Social History Narrative  . None     Current Outpatient Medications:  .  aspirin 81 MG tablet, Take 81 mg by mouth daily.  , Disp: , Rfl:  .  B Complex  Vitamins (VITAMIN B-COMPLEX PO), Take by mouth., Disp: , Rfl:  .  buPROPion (WELLBUTRIN SR) 150 MG 12 hr tablet, Take 1 tablet (150 mg total) by mouth 2 (two) times daily., Disp: 60 tablet, Rfl: 5 .  Calcium Carbonate-Vit D-Min (CALCIUM 1200 PO), Take 1 tablet by mouth daily.  , Disp: , Rfl:  .  Cholecalciferol (VITAMIN D3) 1000 UNITS capsule, Take 1,000 Units by mouth daily.  , Disp: , Rfl:  .  clindamycin (CLINDAGEL) 1 % gel, Apply topically 2 (two) times daily., Disp: 30 g, Rfl: 3 .  diphenhydramine-acetaminophen (TYLENOL PM) 25-500 MG TABS, Take 1 tablet by mouth at bedtime as needed., Disp: , Rfl:  .  Docusate Sodium (COLACE PO), Take by mouth., Disp: , Rfl:  .  fish oil-omega-3 fatty acids 1000 MG capsule, Take 1 g by mouth daily.,  Disp: , Rfl:  .  gabapentin (NEURONTIN) 100 MG capsule, Take 2 capsules (200 mg total) by mouth at bedtime., Disp: 60 capsule, Rfl: 3 .  MAGNESIUM PO, Take by mouth., Disp: , Rfl:  .  niacin 250 MG tablet, Take 250 mg by mouth daily with breakfast.  , Disp: , Rfl:  .  pantoprazole (PROTONIX) 40 MG tablet, Take 1 tablet (40 mg total) by mouth daily., Disp: 30 tablet, Rfl: 5 .  SUMAtriptan (IMITREX) 100 MG tablet, Take 1/2-1 tablet at the first signs of a migraine. May repeat once in 2 hours if needed. Do not take more than 2 doses in 24 hours., Disp: 9 tablet, Rfl: 2 .  Tretinoin, Facial Wrinkles, (TRETINOIN, EMOLLIENT,) 0.05 % CREA, Apply a small amount to affected area nightly, Disp: 20 g, Rfl: 1 .  vitamin E 400 UNIT capsule, Take 400 Units by mouth daily.  , Disp: , Rfl:   EXAM:  Vitals:   03/03/17 1313  BP: 120/76  Pulse: 71  Temp: 97.9 F (36.6 C)    Body mass index is 23.15 kg/m.  GENERAL: vitals reviewed and listed above, alert, oriented, appears well hydrated and in no acute distress  HEENT: atraumatic, conjunttiva clear, no obvious abnormalities on inspection of external nose and ears  NECK: no obvious masses on inspection  LUNGS: clear to auscultation bilaterally, no wheezes, rales or rhonchi, good air movement  CV: HRRR, no peripheral edema  MS: moves all extremities without noticeable abnormality, normal inspection of the hands and wrist without abnormalities appreciated, very small subcutaneous nodule in the subcutaneous fat in the right anterior shin.  PSYCH: pleasant and cooperative, no obvious depression or anxiety  ASSESSMENT AND PLAN:  Discussed the following assessment and plan:  Pain in both hands  Elevated blood pressure reading  Lump of skin of right lower extremity  -We will continue to watch her blood pressure and I did recommend a healthy diet and regular exercise -Discussed medications for the treatment of blood pressure, she does not want to  take medications -Symptoms in her hands and wrist are classic for carpal tunnel syndrome, suggested a cockup wrist splint at night for the next few months to follow-up with her specialist if not improving -Stress potential etiologies for the knot in her leg, this is most likely a small lipoma or cyst -Patient advised to return or notify a doctor immediately if symptoms worsen or persist or new concerns arise.  Patient Instructions  BEFORE YOU LEAVE: -PHQ9 -follow up: 6 months  [facility] for women wrist brace at night for 3 months, apply loosely   We recommend the following healthy lifestyle  for LIFE: 1) Small portions. But, make sure to get regular (at least 3 per day), healthy meals and small healthy snacks if needed.  2) Eat a healthy clean diet.   TRY TO EAT: -at least 5-7 servings of low sugar, colorful, and nutrient rich vegetables per day (not corn, potatoes or bananas.) -berries are the best choice if you wish to eat fruit (only eat small amounts if trying to reduce weight)  -lean meets (fish, white meat of chicken or [place]) -vegan proteins for some meals - beans or tofu, whole grains, nuts and seeds -Replace bad fats with good fats - good fats include: fish, nuts and seeds, canola oil, olive oil -small amounts of low fat or non fat dairy -small amounts of100 % whole grains - check the lables -drink plenty of water  AVOID: -SUGAR, sweets, anything with added sugar, corn syrup or sweeteners - must read labels as even foods advertised as "healthy" often are loaded with sugar -if you must have a sweetener, small amounts of stevia may be best -sweetened beverages and artificially sweetened beverages -simple starches (rice, bread, potatoes, pasta, chips, etc - small amounts of 100% whole grains are ok) -red meat, pork, butter -fried foods, fast food, processed food, excessive dairy, eggs and coconut.  3)Get at least 150 minutes of sweaty aerobic exercise per week.  4)Reduce  stress - consider counseling, meditation and relaxation to balance other aspects of your life.      Colin Benton R., DO

## 2017-03-03 ENCOUNTER — Telehealth: Payer: Self-pay | Admitting: Family Medicine

## 2017-03-03 ENCOUNTER — Ambulatory Visit: Payer: BLUE CROSS/BLUE SHIELD | Admitting: Family Medicine

## 2017-03-03 ENCOUNTER — Encounter: Payer: Self-pay | Admitting: Family Medicine

## 2017-03-03 VITALS — BP 120/76 | HR 71 | Temp 97.9°F | Ht 66.0 in | Wt 143.4 lb

## 2017-03-03 DIAGNOSIS — R030 Elevated blood-pressure reading, without diagnosis of hypertension: Secondary | ICD-10-CM | POA: Diagnosis not present

## 2017-03-03 DIAGNOSIS — M79641 Pain in right hand: Secondary | ICD-10-CM

## 2017-03-03 DIAGNOSIS — R2241 Localized swelling, mass and lump, right lower limb: Secondary | ICD-10-CM | POA: Diagnosis not present

## 2017-03-03 DIAGNOSIS — M79642 Pain in left hand: Secondary | ICD-10-CM | POA: Diagnosis not present

## 2017-03-03 MED ORDER — BUPROPION HCL ER (SR) 150 MG PO TB12: 150.0000 mg | ORAL_TABLET | Freq: Two times a day (BID) | ORAL | 5 refills | Status: DC

## 2017-03-03 NOTE — Telephone Encounter (Signed)
Pt called and notified that med is at pharmacy

## 2017-03-03 NOTE — Patient Instructions (Addendum)
BEFORE YOU LEAVE: -PHQ9 -follow up: 6 months  [employer] for women wrist brace at night for 3 months, apply loosely   We recommend the following healthy lifestyle for LIFE: 1) Small portions. But, make sure to get regular (at least 3 per day), healthy meals and small healthy snacks if needed.  2) Eat a healthy clean diet.   TRY TO EAT: -at least 5-7 servings of low sugar, colorful, and nutrient rich vegetables per day (not corn, potatoes or bananas.) -berries are the best choice if you wish to eat fruit (only eat small amounts if trying to reduce weight)  -lean meets (fish, white meat of chicken or [place]) -vegan proteins for some meals - beans or tofu, whole grains, nuts and seeds -Replace bad fats with good fats - good fats include: fish, nuts and seeds, canola oil, olive oil -small amounts of low fat or non fat dairy -small amounts of100 % whole grains - check the lables -drink plenty of water  AVOID: -SUGAR, sweets, anything with added sugar, corn syrup or sweeteners - must read labels as even foods advertised as "healthy" often are loaded with sugar -if you must have a sweetener, small amounts of stevia may be best -sweetened beverages and artificially sweetened beverages -simple starches (rice, bread, potatoes, pasta, chips, etc - small amounts of 100% whole grains are ok) -red meat, pork, butter -fried foods, fast food, processed food, excessive dairy, eggs and coconut.  3)Get at least 150 minutes of sweaty aerobic exercise per week.  4)Reduce stress - consider counseling, meditation and relaxation to balance other aspects of your life.

## 2017-03-03 NOTE — Telephone Encounter (Signed)
Copied from [place] [id number]. Topic: Quick Communication - See Telephone Encounter >> Mar 03, 2017  2:10 PM Boyd Kerbs wrote: CRM for notification. See Telephone encounter for:  Patient called left and did not have prescription with her, for Wellbutrin [id number] - [place], [place] - [place] 00/93/81.

## 2017-03-03 NOTE — Telephone Encounter (Signed)
Seen in office today  

## 2017-03-06 ENCOUNTER — Ambulatory Visit: Payer: BLUE CROSS/BLUE SHIELD | Admitting: Family Medicine

## 2017-03-06 ENCOUNTER — Encounter: Payer: Self-pay | Admitting: Family Medicine

## 2017-03-06 VITALS — BP 140/82 | Ht 66.0 in | Wt 144.0 lb

## 2017-03-06 DIAGNOSIS — M999 Biomechanical lesion, unspecified: Secondary | ICD-10-CM

## 2017-03-06 DIAGNOSIS — M5030 Other cervical disc degeneration, unspecified cervical region: Secondary | ICD-10-CM

## 2017-03-06 NOTE — Assessment & Plan Note (Signed)
Decision today to treat with OMT was based on Physical Exam  After verbal consent patient was treated with HVLA, ME, FPR techniques in cervical, thoracic, rib areas  Patient tolerated the procedure well with improvement in symptoms  Patient given exercises, stretches and lifestyle modifications  See medications in patient instructions if given  Patient will follow up in 4-6 weeks 

## 2017-03-06 NOTE — Assessment & Plan Note (Signed)
Patient is doing relatively well overall.  Possibility of again about epidurals or different medications that could be beneficial.  Patient has declined any other significant changes at this time.  Patient was to continue the conservative therapy we will.  Follow-up again in 4-6 weeks

## 2017-03-06 NOTE — Progress Notes (Signed)
[name] [address], [place] [address] Phone: [phone] Subjective:      CC: neck and back pain   [id number]  Lindsay [name] is a 66 y.o. female coming in for follow up for back pain. She is having tightness due to stress.  Patient has known arthritic changes in the neck.  Patient does not want to continue with regular epidurals.  Does not want any significant intervention.  Continues to have pain overall.  Patient was to start Effexor which patient declined.  Patient has been doing the gabapentin still at night.  Responded decently to osteopathic manipulation       Past Medical History:  Diagnosis Date  . COMMON MIGRAINE 09/20/2006  . DEPRESSION 06/11/2009  . GERD 06/11/2009  . Neck pain    eval with sports medicine in 2016/17   Past Surgical History:  Procedure Laterality Date  . ABDOMINAL HYSTERECTOMY    . CHOLECYSTECTOMY    . OVARIAN CYST REMOVAL    . REPAIR EXTENSOR TENDON HAND    . TONSILLECTOMY     Social History   Socioeconomic History  . Marital status: Married    Spouse name: None  . Number of children: None  . Years of education: None  . Highest education level: None  Social Needs  . Financial resource strain: None  . Food insecurity - worry: None  . Food insecurity - inability: None  . Transportation needs - medical: None  . Transportation needs - non-medical: None  Occupational History  . None  Tobacco Use  . Smoking status: Former Smoker    Types: Cigarettes    Last attempt to quit: 04/04/2004    Years since quitting: 12.9  . Smokeless tobacco: Never Used  Substance and Sexual Activity  . Alcohol use: Yes    Alcohol/week: 0.0 oz    Comment: occ  . Drug use: No  . Sexual activity: None  Other Topics Concern  . None  Social History Narrative  . None   No Known Allergies Family History  Problem Relation Age of Onset  . Dementia Mother   . Osteoporosis Mother   . Breast cancer Sister   . Heart  attack Brother   . Colon cancer Neg Hx   . Esophageal cancer Neg Hx   . Rectal cancer Neg Hx   . Stomach cancer Neg Hx      Past medical history, social, surgical and family history all reviewed in electronic medical record.  No pertanent information unless stated regarding to the chief complaint.   Review of Systems:Review of systems updated and as accurate as of 03/06/17  No headache, visual changes, nausea, vomiting, diarrhea, constipation, dizziness, abdominal pain, skin rash, fevers, chills, night sweats, weight loss, swollen lymph nodes, body aches, joint swelling, chest pain, shortness of breath, mood changes.  Positive muscle aches  Objective  Blood pressure 140/82, height 5\' 6"  (1.676 m), weight 144 lb (65.3 kg), SpO2 98 %. Systems examined below as of 03/06/17   General: No apparent distress alert and oriented x3 mood and affect normal, dressed appropriately.  HEENT: Pupils equal, extraocular movements intact  Respiratory: Patient's speak in full sentences and does not appear short of breath  Cardiovascular: No lower extremity edema, non tender, no erythema  Skin: Warm dry intact with no signs of infection or rash on extremities or on axial skeleton.  Abdomen: Soft nontender  Neuro: Cranial nerves II through XII are intact, neurovascularly intact in  all extremities with 2+ DTRs and 2+ pulses.  Lymph: No lymphadenopathy of posterior or anterior cervical chain or axillae bilaterally.  Gait normal with good balance and coordination.  MSK:  Non tender with full range of motion and good stability and symmetric strength and tone of shoulders, elbows, wrist, hip, knee and ankles bilaterally.  Mild arthritic changes Neck: Inspection mild loss of lordosis. No palpable stepoffs. Mild positive Spurling's maneuver. Mild loss of range of motion.  In all planes Grip strength and sensation normal in bilateral hands Strength good C4 to T1 distribution No sensory change to C4 to  T1 Negative Hoffman sign bilaterally Reflexes normal  Osteopathic findings C2 flexed rotated and side bent right C4 flexed rotated and side bent left C7 flexed rotated and side bent right  T6 extended rotated and side bent left thing healed rib L2 flexed rotated and side bent right Sacrum right on right     Impression and Recommendations:     This case required medical decision making of moderate complexity.      Note: This dictation was prepared with Dragon dictation along with smaller phrase technology. Any transcriptional errors that result from this process are unintentional.

## 2017-03-23 ENCOUNTER — Ambulatory Visit: Payer: BLUE CROSS/BLUE SHIELD

## 2017-04-10 NOTE — Progress Notes (Signed)
[name] [address], [place] [address] Phone: [phone] Subjective:     CC: Neck pain follow-up  [id number]  AMA MCMASTER is a 67 y.o. female coming in with complaint of cervical neck pain. Continues to take only intermittent medications. Patient continues to have the pain seems to be intermittent though with the numbness on the right hand. Patient denies any weakness though. Rates the severity of pain as 5 out of 10.     Past Medical History:  Diagnosis Date  . COMMON MIGRAINE 09/20/2006  . DEPRESSION 06/11/2009  . GERD 06/11/2009  . Neck pain    eval with sports medicine in 2016/17   Past Surgical History:  Procedure Laterality Date  . ABDOMINAL HYSTERECTOMY    . CHOLECYSTECTOMY    . OVARIAN CYST REMOVAL    . REPAIR EXTENSOR TENDON HAND    . TONSILLECTOMY     Social History   Socioeconomic History  . Marital status: Married    Spouse name: Not on file  . Number of children: Not on file  . Years of education: Not on file  . Highest education level: Not on file  Social Needs  . Financial resource strain: Not on file  . Food insecurity - worry: Not on file  . Food insecurity - inability: Not on file  . Transportation needs - medical: Not on file  . Transportation needs - non-medical: Not on file  Occupational History  . Not on file  Tobacco Use  . Smoking status: Former Smoker    Types: Cigarettes    Last attempt to quit: 04/04/2004    Years since quitting: 13.0  . Smokeless tobacco: Never Used  Substance and Sexual Activity  . Alcohol use: Yes    Alcohol/week: 0.0 oz    Comment: occ  . Drug use: No  . Sexual activity: Not on file  Other Topics Concern  . Not on file  Social History Narrative  . Not on file   No Known Allergies Family History  Problem Relation Age of Onset  . Dementia Mother   . Osteoporosis Mother   . Breast cancer Sister   . Heart attack Brother   . Colon cancer Neg Hx   . Esophageal  cancer Neg Hx   . Rectal cancer Neg Hx   . Stomach cancer Neg Hx      Past medical history, social, surgical and family history all reviewed in electronic medical record.  No pertanent information unless stated regarding to the chief complaint.   Review of Systems:Review of systems updated and as accurate as of 04/10/17  No headache, visual changes, nausea, vomiting, diarrhea, constipation, dizziness, abdominal pain, skin rash, fevers, chills, night sweats, weight loss, swollen lymph nodes, body aches, joint swelling chest pain, shortness of breath, mood changes. Positive muscle aches  Objective  There were no vitals taken for this visit. Systems examined below as of 04/10/17   General: No apparent distress alert and oriented x3 mood and affect normal, dressed appropriately.  HEENT: Pupils equal, extraocular movements intact  Respiratory: Patient's speak in full sentences and does not appear short of breath  Cardiovascular: No lower extremity edema, non tender, no erythema  Skin: Warm dry intact with no signs of infection or rash on extremities or on axial skeleton.  Abdomen: Soft nontender  Neuro: Cranial nerves II through XII are intact, neurovascularly intact in all extremities with 2+ DTRs and 2+ pulses.  Lymph: No lymphadenopathy of  posterior or anterior cervical chain or axillae bilaterally.  Gait normal with good balance and coordination.  MSK:  Non tender with full range of motion and good stability and symmetric strength and tone of shoulders, elbows, wrist, hip, knee and ankles bilaterally.  Neck: Inspection mild loss in lordosis. No palpable stepoffs. Negative Spurling's maneuver. Mild loss of range of motion in all planes. Lacking 5-10. Grip strength and sensation normal in bilateral hands Strength good C4 to T1 distribution No sensory change to C4 to T1 Negative Hoffman sign bilaterally Reflexes normal\ Tightness on the right trapezius  Osteopathic findings C2  flexed rotated and side bent right C4 flexed rotated and side bent left C7 flexed rotated and side bent left T3 extended rotated and side bent right inhaled third rib T6 extended rotated and side bent left 4-6    Impression and Recommendations:     This case required medical decision making of moderate complexity.      Note: This dictation was prepared with Dragon dictation along with smaller phrase technology. Any transcriptional errors that result from this process are unintentional.

## 2017-04-11 ENCOUNTER — Encounter: Payer: Self-pay | Admitting: Family Medicine

## 2017-04-11 ENCOUNTER — Ambulatory Visit: Payer: BLUE CROSS/BLUE SHIELD | Admitting: Family Medicine

## 2017-04-11 VITALS — BP 140/90 | HR 66 | Ht 66.0 in | Wt 145.0 lb

## 2017-04-11 DIAGNOSIS — M999 Biomechanical lesion, unspecified: Secondary | ICD-10-CM

## 2017-04-11 DIAGNOSIS — M5030 Other cervical disc degeneration, unspecified cervical region: Secondary | ICD-10-CM

## 2017-04-11 NOTE — Patient Instructions (Signed)
4-6 weeks! You are great  Hope the sleep gets better

## 2017-04-11 NOTE — Assessment & Plan Note (Signed)
Decision today to treat with OMT was based on Physical Exam  After verbal consent patient was treated with HVLA, ME, FPR techniques in cervical, thoracic, rib areas  Patient tolerated the procedure well with improvement in symptoms  Patient given exercises, stretches and lifestyle modifications  See medications in patient instructions if given  Patient will follow up in 4-6 weeks 

## 2017-04-11 NOTE — Assessment & Plan Note (Signed)
Severe arthritis. Doing relatively well. Patient then fairly noncompliant on the medications. Continue with the posture and ergonomics. We discussed the possible changes in the work environment. Follow-up again in 4-6 weeks

## 2017-04-13 ENCOUNTER — Encounter: Payer: Self-pay | Admitting: Family Medicine

## 2017-04-17 ENCOUNTER — Ambulatory Visit
Admission: RE | Admit: 2017-04-17 | Discharge: 2017-04-17 | Disposition: A | Discharge status: Home / Self Care | Payer: BLUE CROSS/BLUE SHIELD | Source: Ambulatory Visit | Attending: Family Medicine | Admitting: Family Medicine

## 2017-04-17 DIAGNOSIS — Z1231 Encounter for screening mammogram for malignant neoplasm of breast: Secondary | ICD-10-CM

## 2017-05-02 ENCOUNTER — Encounter: Payer: Self-pay | Admitting: Family Medicine

## 2017-05-02 NOTE — Telephone Encounter (Signed)
Prior auth sent to [email or web address].

## 2017-05-16 ENCOUNTER — Encounter: Payer: Self-pay | Admitting: Family Medicine

## 2017-05-17 ENCOUNTER — Encounter: Payer: Self-pay | Admitting: Family Medicine

## 2017-05-17 ENCOUNTER — Ambulatory Visit: Payer: BLUE CROSS/BLUE SHIELD | Admitting: Family Medicine

## 2017-05-17 VITALS — BP 132/88 | Ht 66.0 in | Wt 144.0 lb

## 2017-05-17 DIAGNOSIS — M999 Biomechanical lesion, unspecified: Secondary | ICD-10-CM | POA: Diagnosis not present

## 2017-05-17 DIAGNOSIS — M5030 Other cervical disc degeneration, unspecified cervical region: Secondary | ICD-10-CM

## 2017-05-17 NOTE — Assessment & Plan Note (Addendum)
Decision today to treat with OMT was based on Physical Exam  After verbal consent patient was treated with HVLA, ME, FPR techniques in cervical, thoracic, rib, lumbar and sacral areas  Patient tolerated the procedure well with improvement in symptoms  Patient given exercises, stretches and lifestyle modifications  See medications in patient instructions if given  Patient will follow up in 4 weeks 

## 2017-05-17 NOTE — Progress Notes (Signed)
[name] [place] [place], [place] [address] Phone: [phone] Subjective:    CC: Neck pain follow-up  [id number]  Lindsay [name] is a 67 y.o. female coming in with complaint of degenerative cervical spine. She said that she has been havign some tightnees in her neck.  Patient states still has the radicular symptoms going down to the middle fingers bilaterally.  Denies any weakness.     Past Medical History:  Diagnosis Date  . COMMON MIGRAINE 09/20/2006  . DEPRESSION 06/11/2009  . GERD 06/11/2009  . Neck pain    eval with sports medicine in 2016/17   Past Surgical History:  Procedure Laterality Date  . ABDOMINAL HYSTERECTOMY    . CHOLECYSTECTOMY    . OVARIAN CYST REMOVAL    . REPAIR EXTENSOR TENDON HAND    . TONSILLECTOMY     Social History   Socioeconomic History  . Marital status: Married    Spouse name: None  . Number of children: None  . Years of education: None  . Highest education level: None  Social Needs  . Financial resource strain: None  . Food insecurity - worry: None  . Food insecurity - inability: None  . Transportation needs - medical: None  . Transportation needs - non-medical: None  Occupational History  . None  Tobacco Use  . Smoking status: Former Smoker    Types: Cigarettes    Last attempt to quit: 04/04/2004    Years since quitting: 13.1  . Smokeless tobacco: Never Used  Substance and Sexual Activity  . Alcohol use: Yes    Alcohol/week: 0.0 oz    Comment: occ  . Drug use: No  . Sexual activity: None  Other Topics Concern  . None  Social History Narrative  . None   No Known Allergies Family History  Problem Relation Age of Onset  . Dementia Mother   . Osteoporosis Mother   . Breast cancer Sister   . Heart attack Brother   . Colon cancer Neg Hx   . Esophageal cancer Neg Hx   . Rectal cancer Neg Hx   . Stomach cancer Neg Hx      Past medical history, social, surgical and family history all  reviewed in electronic medical record.  No pertanent information unless stated regarding to the chief complaint.   Review of Systems:Review of systems updated and as accurate as of 05/17/17  No  visual changes, nausea, vomiting, diarrhea, constipation, dizziness, abdominal pain, skin rash, fevers, chills, night sweats, weight loss, swollen lymph nodes, body aches, joint swelling, chest pain, shortness of breath, mood changes.  Positive muscle aches, headaches  Objective  Blood pressure 132/88, height 5\' 6"  (1.676 m), weight 144 lb (65.3 kg). Systems examined below as of 05/17/17   General: No apparent distress alert and oriented x3 mood and affect normal, dressed appropriately.  HEENT: Pupils equal, extraocular movements intact  Respiratory: Patient's speak in full sentences and does not appear short of breath  Cardiovascular: No lower extremity edema, non tender, no erythema  Skin: Warm dry intact with no signs of infection or rash on extremities or on axial skeleton.  Abdomen: Soft nontender  Neuro: Cranial nerves II through XII are intact, neurovascularly intact in all extremities with 2+ DTRs and 2+ pulses.  Lymph: No lymphadenopathy of posterior or anterior cervical chain or axillae bilaterally.  Gait normal with good balance and coordination.  MSK:  Non tender with full range of motion and good  stability and symmetric strength and tone of shoulders, elbows, wrist, hip, knee and ankles bilaterally.  Neck: Inspection mild loss of lordosis. No palpable stepoffs. Negative Spurling's maneuver. Mild decrease in range of motion lacking last 15 degrees of extension, does have significant crepitus with range of motion of the neck. Grip strength and sensation normal in bilateral hands Strength good C4 to T1 distribution No sensory change to C4 to T1 Negative Hoffman sign bilaterally Reflexes normal Significant tightness of the left and right trapezius muscles.  Osteopathic findings C2  flexed rotated and side bent right C6 flexed rotated and side bent left T3 extended rotated and side bent left inhaled third rib T7 extended rotated and side bent left L3 flexed rotated and side bent right    Impression and Recommendations:     This case required medical decision making of moderate complexity.      Note: This dictation was prepared with Dragon dictation along with smaller phrase technology. Any transcriptional errors that result from this process are unintentional.

## 2017-05-17 NOTE — Patient Instructions (Signed)
Good to see you  3-4 weeks  Nothing new but wine is good Happy Valentines day

## 2017-05-17 NOTE — Assessment & Plan Note (Signed)
Continues to have severe degenerative changes. Patient continues to respond fairly well to osteopathic manipulation.  Declined any other type of an epidural again at this time.  We discussed medications which patient has been fairly noncompliant with.  Patient will follow up with me again in 4 weeks

## 2017-05-22 NOTE — Telephone Encounter (Signed)
Pt calling about prior auth, she still has not gotten her rx yet. Please call pt at [phone] [facility] [address]

## 2017-05-30 NOTE — Telephone Encounter (Signed)
A fax was received from [facility] stating the request was denied. Dr Maudie Mercury reviewed this and stated to let the pt know and she could pay out of pocket if she preferred to.  I called the pt and informed her of this and the letter was sent to be scanned.

## 2017-06-12 NOTE — Progress Notes (Signed)
[name] [address] [place], [place] [address] Phone: [phone] Subjective:      CC: Neck pain follow-up  [id number]  Lindsay [name] is a 67 y.o. female coming in with complaint of neck pain.  Patient has known cervical degenerative disc disease.  History of migraines.  Worsening migraines recently.  States that had one that woke her up at 5 AM.  Denies any visual changes.  Did respond well to a whole Imitrex.  Patient states that he continues to have the numbness in fingers.  Not worsening.  Still does not want to do another epidural.       Past Medical History:  Diagnosis Date  . COMMON MIGRAINE 09/20/2006  . DEPRESSION 06/11/2009  . GERD 06/11/2009  . Neck pain    eval with sports medicine in 2016/17   Past Surgical History:  Procedure Laterality Date  . ABDOMINAL HYSTERECTOMY    . CHOLECYSTECTOMY    . OVARIAN CYST REMOVAL    . REPAIR EXTENSOR TENDON HAND    . TONSILLECTOMY     Social History   Socioeconomic History  . Marital status: Married    Spouse name: None  . Number of children: None  . Years of education: None  . Highest education level: None  Social Needs  . Financial resource strain: None  . Food insecurity - worry: None  . Food insecurity - inability: None  . Transportation needs - medical: None  . Transportation needs - non-medical: None  Occupational History  . None  Tobacco Use  . Smoking status: Former Smoker    Types: Cigarettes    Last attempt to quit: 04/04/2004    Years since quitting: 13.2  . Smokeless tobacco: Never Used  Substance and Sexual Activity  . Alcohol use: Yes    Alcohol/week: 0.0 oz    Comment: occ  . Drug use: No  . Sexual activity: None  Other Topics Concern  . None  Social History Narrative  . None   No Known Allergies Family History  Problem Relation Age of Onset  . Dementia Mother   . Osteoporosis Mother   . Breast cancer Sister   . Heart attack Brother   . Colon cancer  Neg Hx   . Esophageal cancer Neg Hx   . Rectal cancer Neg Hx   . Stomach cancer Neg Hx      Past medical history, social, surgical and family history all reviewed in electronic medical record.  No pertanent information unless stated regarding to the chief complaint.   Review of Systems:Review of systems updated and as accurate as of 06/13/17  No , visual changes, nausea, vomiting, diarrhea, constipation, dizziness, abdominal pain, skin rash, fevers, chills, night sweats, weight loss, swollen lymph nodes, body aches, joint swelling, muscle aches, chest pain, shortness of breath, mood changes.  Positive headaches  Objective  Blood pressure 126/84, pulse 74, height 5\' 6"  (1.676 m), weight 145 lb (65.8 kg), SpO2 96 %. Systems examined below as of 06/13/17   General: No apparent distress alert and oriented x3 mood and affect normal, dressed appropriately.  HEENT: Pupils equal, extraocular movements intact  Respiratory: Patient's speak in full sentences and does not appear short of breath  Cardiovascular: No lower extremity edema, non tender, no erythema  Skin: Warm dry intact with no signs of infection or rash on extremities or on axial skeleton.  Abdomen: Soft nontender  Neuro: Cranial nerves II through XII are intact, neurovascularly intact  in all extremities with 2+ DTRs and 2+ pulses.  Lymph: No lymphadenopathy of posterior or anterior cervical chain or axillae bilaterally.  Gait normal with good balance and coordination.  MSK:  Non tender with full range of motion and good stability and symmetric strength and tone of shoulders, elbows, wrist, hip, knee and ankles bilaterally.  Mild arthritis of multiple joints  Neck: Inspection loss of lordosis. No palpable stepoffs. Negative Spurling's maneuver. Mild limitation in extension as well as side bending bilaterally Grip strength and sensation normal in bilateral hands Strength good C4 to T1 distribution Chronic numbness in the C7  distribution Negative Hoffman sign bilaterally Reflexes normal Tenderness to palpation in the paraspinal musculature   Osteopathic findings C2 flexed rotated and side bent right C4 flexed rotated and side bent left C6 flexed rotated and side bent left T3 extended rotated and side bent right inhaled third rib T5 extended rotated and side bent left     Impression and Recommendations:     This case required medical decision making of moderate complexity.      Note: This dictation was prepared with Dragon dictation along with smaller phrase technology. Any transcriptional errors that result from this process are unintentional.

## 2017-06-13 ENCOUNTER — Encounter: Payer: Self-pay | Admitting: Family Medicine

## 2017-06-13 ENCOUNTER — Ambulatory Visit: Payer: BLUE CROSS/BLUE SHIELD | Admitting: Family Medicine

## 2017-06-13 VITALS — BP 126/84 | HR 74 | Ht 66.0 in | Wt 145.0 lb

## 2017-06-13 DIAGNOSIS — M5030 Other cervical disc degeneration, unspecified cervical region: Secondary | ICD-10-CM

## 2017-06-13 DIAGNOSIS — M999 Biomechanical lesion, unspecified: Secondary | ICD-10-CM | POA: Diagnosis not present

## 2017-06-13 NOTE — Patient Instructions (Signed)
Good to see you  Lindsay [name] is your friend.  See me again in 4 weeks

## 2017-06-13 NOTE — Assessment & Plan Note (Signed)
Degenerative disc disease still noted.  Continues to have chronic radicular symptoms.  No weakness or noted.  Discussed with patient at great length about icing regimen and home exercises.  Has had difficulty with migraines and if any worsening symptoms may need to consider advanced imaging.  Patient is in agreement with this and will call if any worsening pain orders to seek medical attention.  Follow-up with me again in 4 weeks

## 2017-06-13 NOTE — Assessment & Plan Note (Signed)
Decision today to treat with OMT was based on Physical Exam  After verbal consent patient was treated with HVLA, ME, FPR techniques in cervical, thoracic, rib areas  Patient tolerated the procedure well with improvement in symptoms  Patient given exercises, stretches and lifestyle modifications  See medications in patient instructions if given  Patient will follow up in 4 weeks 

## 2017-07-02 ENCOUNTER — Other Ambulatory Visit: Payer: Self-pay | Admitting: Family Medicine

## 2017-07-02 DIAGNOSIS — G43009 Migraine without aura, not intractable, without status migrainosus: Secondary | ICD-10-CM

## 2017-07-10 NOTE — Progress Notes (Signed)
[name] [address] [place], [place] [address] Phone: [phone] Subjective:     CC: Neck pain follow-up  [id number]  Lindsay [name] is a 67 y.o. female coming in with complaint of neck and back pain. She is having tingling bilateral hands. She said that she has been under a lot of stress though.  Neck pain.  Patient was seen previously and does have severe arthritis.  Patient has been fairly noncompliant with her medications.  He is having some worsening symptoms that she would state.       Past Medical History:  Diagnosis Date  . COMMON MIGRAINE 09/20/2006  . DEPRESSION 06/11/2009  . GERD 06/11/2009  . Neck pain    eval with sports medicine in 2016/17   Past Surgical History:  Procedure Laterality Date  . ABDOMINAL HYSTERECTOMY    . CHOLECYSTECTOMY    . OVARIAN CYST REMOVAL    . REPAIR EXTENSOR TENDON HAND    . TONSILLECTOMY     Social History   Socioeconomic History  . Marital status: Married    Spouse name: Not on file  . Number of children: Not on file  . Years of education: Not on file  . Highest education level: Not on file  Occupational History  . Not on file  Social Needs  . Financial resource strain: Not on file  . Food insecurity:    Worry: Not on file    Inability: Not on file  . Transportation needs:    Medical: Not on file    Non-medical: Not on file  Tobacco Use  . Smoking status: Former Smoker    Types: Cigarettes    Last attempt to quit: 04/04/2004    Years since quitting: 13.2  . Smokeless tobacco: Never Used  Substance and Sexual Activity  . Alcohol use: Yes    Alcohol/week: 0.0 oz    Comment: occ  . Drug use: No  . Sexual activity: Not on file  Lifestyle  . Physical activity:    Days per week: Not on file    Minutes per session: Not on file  . Stress: Not on file  Relationships  . Social connections:    Talks on phone: Not on file    Gets together: Not on file    Attends religious service: Not  on file    Active member of club or organization: Not on file    Attends meetings of clubs or organizations: Not on file    Relationship status: Not on file  Other Topics Concern  . Not on file  Social History Narrative  . Not on file   No Known Allergies Family History  Problem Relation Age of Onset  . Dementia Mother   . Osteoporosis Mother   . Breast cancer Sister   . Heart attack Brother   . Colon cancer Neg Hx   . Esophageal cancer Neg Hx   . Rectal cancer Neg Hx   . Stomach cancer Neg Hx      Past medical history, social, surgical and family history all reviewed in electronic medical record.  No pertanent information unless stated regarding to the chief complaint.   Review of Systems:Review of systems updated and as accurate as of 07/11/17  No , visual changes, nausea, vomiting, diarrhea, constipation, dizziness, abdominal pain, skin rash, fevers, chills, night sweats, weight loss, swollen lymph nodes, body aches, joint swelling chest pain, shortness of breath, mood changes.  Positive headache and muscle  aches  Objective  Blood pressure (!) 128/92, pulse 65, height 5\' 6"  (1.676 m), weight 145 lb (65.8 kg), SpO2 98 %. Systems examined below as of 07/11/17   General: No apparent distress alert and oriented x3 mood and affect normal, dressed appropriately.  HEENT: Pupils equal, extraocular movements intact  Respiratory: Patient's speak in full sentences and does not appear short of breath  Cardiovascular: No lower extremity edema, non tender, no erythema  Skin: Warm dry intact with no signs of infection or rash on extremities or on axial skeleton.  Abdomen: Soft nontender  Neuro: Cranial nerves II through XII are intact, neurovascularly intact in all extremities with 2+ DTRs and 2+ pulses.  Lymph: No lymphadenopathy of posterior or anterior cervical chain or axillae bilaterally.  Gait normal with good balance and coordination.  MSK:  Non tender with full range of motion  and good stability and symmetric strength and tone of shoulders, elbows, wrist, hip, knee and ankles bilaterally.  Neck: Inspection loss of lordosis. No palpable stepoffs. Positive Spurling's with decreased range of motion in all planes by 5-10 degrees crepitus noted Grip strength and sensation normal in bilateral hands Strength good C4 to T1 distribution No sensory change to C4 to T1 Negative Hoffman sign bilaterally Reflexes normal  Osteopathic findings C4 flexed rotated and side bent right C6 flexed rotated and side bent right T3 extended rotated and side bent left inhaled third rib T9 extended rotated and side bent left      Impression and Recommendations:     This case required medical decision making of moderate complexity.      Note: This dictation was prepared with Dragon dictation along with smaller phrase technology. Any transcriptional errors that result from this process are unintentional.       '

## 2017-07-11 ENCOUNTER — Encounter: Payer: Self-pay | Admitting: Family Medicine

## 2017-07-11 ENCOUNTER — Ambulatory Visit: Payer: BLUE CROSS/BLUE SHIELD | Admitting: Family Medicine

## 2017-07-11 VITALS — BP 128/92 | HR 65 | Ht 66.0 in | Wt 145.0 lb

## 2017-07-11 DIAGNOSIS — M999 Biomechanical lesion, unspecified: Secondary | ICD-10-CM

## 2017-07-11 DIAGNOSIS — M5030 Other cervical disc degeneration, unspecified cervical region: Secondary | ICD-10-CM

## 2017-07-11 MED ORDER — TRAZODONE HCL 50 MG PO TABS: 25.0000 mg | ORAL_TABLET | Freq: Every evening | ORAL | 3 refills | Status: DC | PRN

## 2017-07-11 NOTE — Patient Instructions (Signed)
Good to see you  Ice is your friend Trazodone nightly for sleep and maybe pain  See me again in 3-4 weeks

## 2017-07-11 NOTE — Assessment & Plan Note (Signed)
Severe.  No weakness though.  Patient has responded well to epidural previously.  We discussed icing regimen and home exercises.  Discussed which activities to do poorly.  Patient is to increase activity slowly over the course of the next several days.  Patient is to increase possibly taking gabapentin on a more religious basis.  Trazodone given for nighttime relief as well.  Follow-up again in 4 weeks

## 2017-07-11 NOTE — Assessment & Plan Note (Addendum)
Decision today to treat with OMT was based on Physical Exam  After verbal consent patient was treated with HVLA, ME, FPR techniques in cervical, thoracic, rib,  areas  Patient tolerated the procedure well with improvement in symptoms  Patient given exercises, stretches and lifestyle modifications  See medications in patient instructions if given  Patient will follow up in 4 weeks 

## 2017-07-27 ENCOUNTER — Telehealth: Payer: Self-pay | Admitting: Family Medicine

## 2017-07-27 NOTE — Telephone Encounter (Signed)
I spoke with pt and yes it will be okay to get vaccine during office visit, which is coming up in May 2019.

## 2017-07-27 NOTE — Telephone Encounter (Signed)
Copied from [place] [id number]. Topic: Quick Communication - See Telephone Encounter >> Jul 27, 2017  9:15 AM Cleaster Corin, NT wrote: CRM for notification. See Telephone encounter for: 07/27/17.  Pt. Calling to make sure she is up to date with her TDAP shot (due 08/12/2017) asking is it ok to have it done during her physical appt.. [phone] ok to leave voicemail

## 2017-08-02 ENCOUNTER — Encounter: Payer: Self-pay | Admitting: Family Medicine

## 2017-08-02 ENCOUNTER — Ambulatory Visit: Payer: BLUE CROSS/BLUE SHIELD | Admitting: Family Medicine

## 2017-08-02 VITALS — BP 132/88 | HR 75 | Ht 66.0 in | Wt 142.0 lb

## 2017-08-02 DIAGNOSIS — M999 Biomechanical lesion, unspecified: Secondary | ICD-10-CM

## 2017-08-02 DIAGNOSIS — M5030 Other cervical disc degeneration, unspecified cervical region: Secondary | ICD-10-CM

## 2017-08-02 NOTE — Assessment & Plan Note (Signed)
Patient does have severe arthritis in the neck.  I do believe that an epidural could be beneficial.  Patient does have some stress that is likely contributing to some of the pain as well.  We discussed icing regimen and home exercises, posture and ergonomics.  Follow-up again in 4 to 6 weeks

## 2017-08-02 NOTE — Assessment & Plan Note (Addendum)
Decision today to treat with OMT was based on Physical Exam  After verbal consent patient was treated with HVLA, ME, FPR techniques in cervical, thoracic, rib, lumbar and sacral areas  Patient tolerated the procedure well with improvement in symptoms  Patient given exercises, stretches and lifestyle modifications  See medications in patient instructions if given  Patient will follow up in 4-6 weeks 

## 2017-08-02 NOTE — Progress Notes (Signed)
[name] [place] [place], [place] [address] Phone: [phone] Subjective:    I'm seeing this patient by the request  of:    CC: Neck pain follow-up.  [id number]  Lindsay [name] is a 67 y.o. female coming in with complaint of neck and thoracic spine pain on the right side. She is here for OMT today.  More stress.  More radicular symptoms in the right arm.  Does not have any no weakness though.  Patient rates the severity of pain is 7 out of 10.  Patient has been noncompliant with the medications.  Was given trazodone and has helped her with sleep but sometimes has some difficulty with waking up in the morning.      Past Medical History:  Diagnosis Date  . COMMON MIGRAINE 09/20/2006  . DEPRESSION 06/11/2009  . GERD 06/11/2009  . Neck pain    eval with sports medicine in 2016/17   Past Surgical History:  Procedure Laterality Date  . ABDOMINAL HYSTERECTOMY    . CHOLECYSTECTOMY    . OVARIAN CYST REMOVAL    . REPAIR EXTENSOR TENDON HAND    . TONSILLECTOMY     Social History   Socioeconomic History  . Marital status: Married    Spouse name: Not on file  . Number of children: Not on file  . Years of education: Not on file  . Highest education level: Not on file  Occupational History  . Not on file  Social Needs  . Financial resource strain: Not on file  . Food insecurity:    Worry: Not on file    Inability: Not on file  . Transportation needs:    Medical: Not on file    Non-medical: Not on file  Tobacco Use  . Smoking status: Former Smoker    Types: Cigarettes    Last attempt to quit: 04/04/2004    Years since quitting: 13.3  . Smokeless tobacco: Never Used  Substance and Sexual Activity  . Alcohol use: Yes    Alcohol/week: 0.0 oz    Comment: occ  . Drug use: No  . Sexual activity: Not on file  Lifestyle  . Physical activity:    Days per week: Not on file    Minutes per session: Not on file  . Stress: Not on file    Relationships  . Social connections:    Talks on phone: Not on file    Gets together: Not on file    Attends religious service: Not on file    Active member of club or organization: Not on file    Attends meetings of clubs or organizations: Not on file    Relationship status: Not on file  Other Topics Concern  . Not on file  Social History Narrative  . Not on file   No Known Allergies Family History  Problem Relation Age of Onset  . Dementia Mother   . Osteoporosis Mother   . Breast cancer Sister   . Heart attack Brother   . Colon cancer Neg Hx   . Esophageal cancer Neg Hx   . Rectal cancer Neg Hx   . Stomach cancer Neg Hx      Past medical history, social, surgical and family history all reviewed in electronic medical record.  No pertanent information unless stated regarding to the chief complaint.   Review of Systems:Review of systems updated and as accurate as of 08/02/17  No headache, visual changes, nausea, vomiting, diarrhea,  constipation, dizziness, abdominal pain, skin rash, fevers, chills, night sweats, weight loss, swollen lymph nodes, body aches, joint swelling, chest pain, shortness of breath, mood changes.  Positive muscle aches  Objective  Blood pressure 132/88, pulse 75, height 5\' 6"  (1.676 m), weight 142 lb (64.4 kg), SpO2 96 %. Systems examined below as of 08/02/17   General: No apparent distress alert and oriented x3 mood and affect normal, dressed appropriately.  HEENT: Pupils equal, extraocular movements intact  Respiratory: Patient's speak in full sentences and does not appear short of breath  Cardiovascular: No lower extremity edema, non tender, no erythema  Skin: Warm dry intact with no signs of infection or rash on extremities or on axial skeleton.  Abdomen: Soft nontender  Neuro: Cranial nerves II through XII are intact, neurovascularly intact in all extremities with 2+ DTRs and 2+ pulses.  Lymph: No lymphadenopathy of posterior or anterior  cervical chain or axillae bilaterally.  Gait normal with good balance and coordination.  MSK:  Non tender with full range of motion and good stability and symmetric strength and tone of shoulders, elbows, wrist, hip, knee and ankles bilaterally.  Back exam shows loss of lordosis.  Severe tenderness to palpation over the paraspinal musculature on the right side.  Patient does have some mild limited range of motion 5 to 10 degrees in all planes with significant crepitus.  Negative Spurling's at the moment.  Patient does have some mild weakness in the C6 distribution on the right side compared to the left.  Osteopathic findings C3 flexed rotated and side bent right C4 flexed rotated and side bent left T3 extended rotated and side bent right inhaled third rib T9 extended rotated and side bent left L2 flexed rotated and side bent right Sacrum right on right    Impression and Recommendations:     This case required medical decision making of moderate complexity.      Note: This dictation was prepared with Dragon dictation along with smaller phrase technology. Any transcriptional errors that result from this process are unintentional.

## 2017-08-14 NOTE — Progress Notes (Signed)
HPI:  Using dictation device. Unfortunately this device frequently misinterprets words/phrases.  Here for CPE:  -Concerns and/or follow up today: Labs, ? dexa in July, tetanus booster She reports she has done pretty well overall.  She does wonder if her blood pressure has been running high on occasion.  Sometimes it is up on initial check at sports medicine visits, reports it is better on recheck.  She has had a lot more stress recently and occasionally feels mildly lightheaded or nausea and wonders if this is related to anxiety.  No significant changes in bowels, vomiting, unexplained weight loss, worsening reflux, depression, fevers or other symptoms.  She does have some poor sleep, trazodone was added by her sports medicine doctor to take as needed.  Reports she does not take gabapentin any longer.  No chest pain or shortness of breath.  She takes a lot of supplements.  Reports she never had a diagnosis of B12 deficiency.  She wonders which one she should take and which ones not.  She is taking them for a long time but there is a cost with this.   Chronic medical problems summarized below were reviewed for changes.  Elevated BP: -at OV in April, recheck today planned -she feels is stress - work related which will end soon  GERD: -uses PPI sometimes for chronic GERD  -rare dysphagia, refused GI evaluation in the past,  -hx dilation of esophagus remotely -sstable  Migraines: -Uses prn triptan  Depression and stress: Chronic depression, treated with Wellbutrin; sport med rxd trazadone as well Reports mood ok See hpi  Mild hyperlipidemia: -No regular exercise -Reports a healthy diet  -Diet: variety of foods, balance and well rounded, larger portion sizes -Exercise: no regular exercise -Taking folic acid, vitamin D or calcium: no -Diabetes and Dyslipidemia Screening: due for labs -Vaccines: see vaccine section EPIC -pap history: s/p complete hysterectomy for fibroids per  report -FDLMP: see nursing notes -sexual activity: yes, female partner, no new partners -wants STI testing (Hep C if born [birth year]-65): no -FH breast, colon or ovarian ca: see FH Last mammogram: 04/2017, 3D mammo birads 1 Last colon cancer screening: 08/2014, 10 year repeat Breast Ca Risk Assessment: see family history and pt history DEXA (>/= 51): n/a - due in July, declined ordering now, may consider when 79  -Alcohol, Tobacco, drug use: see social history  Review of Systems - no fevers, unintentional weight loss, vision loss, hearing loss, chest pain, sob, hemoptysis, melena, hematochezia, hematuria, genital discharge, changing or concerning skin lesions, bleeding, bruising, loc, thoughts of self harm or SI  Past Medical History:  Diagnosis Date  . COMMON MIGRAINE 09/20/2006  . DEPRESSION 06/11/2009  . GERD 06/11/2009  . Neck pain    eval with sports medicine in 2016/17    Past Surgical History:  Procedure Laterality Date  . ABDOMINAL HYSTERECTOMY    . CHOLECYSTECTOMY    . OVARIAN CYST REMOVAL    . REPAIR EXTENSOR TENDON HAND    . TONSILLECTOMY      Family History  Problem Relation Age of Onset  . Dementia Mother   . Osteoporosis Mother   . Breast cancer Sister   . Heart attack Brother   . Colon cancer Neg Hx   . Esophageal cancer Neg Hx   . Rectal cancer Neg Hx   . Stomach cancer Neg Hx     Social History   Socioeconomic History  . Marital status: Married    Spouse name: Not on file  .  Number of children: Not on file  . Years of education: Not on file  . Highest education level: Not on file  Occupational History  . Not on file  Social Needs  . Financial resource strain: Not on file  . Food insecurity:    Worry: Not on file    Inability: Not on file  . Transportation needs:    Medical: Not on file    Non-medical: Not on file  Tobacco Use  . Smoking status: Former Smoker    Types: Cigarettes    Last attempt to quit: 04/04/2004    Years since quitting: 13.3  .  Smokeless tobacco: Never Used  Substance and Sexual Activity  . Alcohol use: Yes    Alcohol/week: 0.0 oz    Comment: occ  . Drug use: No  . Sexual activity: Not on file  Lifestyle  . Physical activity:    Days per week: Not on file    Minutes per session: Not on file  . Stress: Not on file  Relationships  . Social connections:    Talks on phone: Not on file    Gets together: Not on file    Attends religious service: Not on file    Active member of club or organization: Not on file    Attends meetings of clubs or organizations: Not on file    Relationship status: Not on file  Other Topics Concern  . Not on file  Social History Narrative  . Not on file     Current Outpatient Medications:  .  aspirin 81 MG tablet, Take 81 mg by mouth daily.  , Disp: , Rfl:  .  buPROPion (WELLBUTRIN SR) 150 MG 12 hr tablet, Take 1 tablet (150 mg total) by mouth 2 (two) times daily., Disp: 60 tablet, Rfl: 5 .  Cholecalciferol (VITAMIN D3) 1000 UNITS capsule, Take 1,000 Units by mouth daily.  , Disp: , Rfl:  .  clindamycin (CLINDAGEL) 1 % gel, Apply topically 2 (two) times daily., Disp: 30 g, Rfl: 3 .  diphenhydramine-acetaminophen (TYLENOL PM) 25-500 MG TABS, Take 1 tablet by mouth at bedtime as needed., Disp: , Rfl:  .  Docusate Sodium (COLACE PO), Take by mouth., Disp: , Rfl:  .  fish oil-omega-3 fatty acids 1000 MG capsule, Take 1 g by mouth daily., Disp: , Rfl:  .  MAGNESIUM PO, Take by mouth., Disp: , Rfl:  .  niacin 250 MG tablet, Take 250 mg by mouth daily with breakfast.  , Disp: , Rfl:  .  pantoprazole (PROTONIX) 40 MG tablet, Take 1 tablet (40 mg total) by mouth daily., Disp: 30 tablet, Rfl: 5 .  SUMAtriptan (IMITREX) 100 MG tablet, TAKE 1/2-1 TABLET BY MOUTH AT ONSET MIGRAINE,MAY REPEAT ONCE 2HOURS IF NEED,MAX 2DOSES IN 24 HOURS, Disp: 9 tablet, Rfl: 2 .  traZODone (DESYREL) 50 MG tablet, Take 0.5-1 tablets (25-50 mg total) by mouth at bedtime as needed for sleep., Disp: 30 tablet, Rfl:  3 .  Tretinoin, Facial Wrinkles, (TRETINOIN, EMOLLIENT,) 0.05 % CREA, Apply a small amount to affected area nightly, Disp: 20 g, Rfl: 1  EXAM:  Vitals:   08/15/17 0928  BP: 112/82  Pulse: 78  Temp: 98 F (36.7 C)    GENERAL: vitals reviewed and listed below, alert, oriented, appears well hydrated and in no acute distress  HEENT: head atraumatic, PERRLA, normal appearance of eyes, ears, nose and mouth. moist mucus membranes.  NECK: supple, no masses or lymphadenopathy  LUNGS: clear to auscultation  bilaterally, no rales, rhonchi or wheeze  CV: HRRR, no peripheral edema or cyanosis, normal pedal pulses  ABDOMEN: bowel sounds normal, soft, non tender to palpation, no masses, no rebound or guarding  GU/BREAST: declined  SKIN: no rash or abnormal lesions  MS: normal gait, moves all extremities normally  NEURO: normal gait, speech and thought processing grossly intact, muscle tone grossly intact throughout  PSYCH: normal affect, pleasant and cooperative  ASSESSMENT AND PLAN:  Discussed the following assessment and plan:  PREVENTIVE EXAM: -Discussed and advised all [place] preventive services health task force level A and B recommendations for age, sex and risks. -Advised at least 150 minutes of exercise per week and a healthy diet with avoidance of (less then 1 serving per week) processed foods, white starches, red meat, fast foods and sweets and consisting of: -labs, studies and vaccines per orders this encounter - Hemoglobin A1c  2. Lightheaded 3. Nausea -discussed potential etiologies and suspect stress related vs migraine; discussed changing wellbutrin whish she opted to hold off on, advised CBT - she opted to hold off for now -opted to check lab, advised gi eval if regular or persistent nausea, worsening or other GI symptoms - Basic metabolic panel - CBC -follow up 3 months   4. Elevated blood pressure reading -she thinks stress related and will improve -recheck in 3  months, consider adding medication  5. Depression, recurrent ([place]) -see above  6. Stress -see above  7. Hyperlipidemia, unspecified hyperlipidemia type -she wants to continue fish oil and niacin rather then medications -lifestyle recs - Lipid panel  8. Gastroesophageal reflux disease, esophagitis presence not specified -cont current tx, GI if needed per above  9. Other migraine without status migrainosus, not intractable -stable  Discussed supplements risks/benefits at length, she has agreed to try stopping several.  Patient advised to return to clinic immediately if symptoms worsen or persist or new concerns.  Patient Instructions  BEFORE YOU LEAVE: -Tdap -labs -follow up: 3 months (BP, bone density, anxiety)  We have ordered labs or studies at this visit. It can take up to 1-2 weeks for results and processing. IF results require follow up or explanation, we will call you with instructions. Clinically stable results will be released to your [facility]. If you have not heard from [place] or cannot find your results in [facility] in 2 weeks please contact our office at [phone].  If you are not yet signed up for [facility], please consider signing up.  consider stopping some of the supplements (cal-vit D combo, vit E, b complex)  Consider Cognitive behavioral therapy for stress if you can afford or when insurance changes   Preventive Care 40-64 Years, Female Preventive care refers to lifestyle choices and visits with your health care provider that can promote health and wellness. What does preventive care include?  A yearly physical exam. This is also called an annual well check.  Dental exams once or twice a year.  Routine eye exams. Ask your health care provider how often you should have your eyes checked.  Personal lifestyle choices, including: ? Daily care of your teeth and gums. ? Regular physical activity. ? Eating a healthy diet. ? Avoiding tobacco and drug  use. ? Limiting alcohol use. ? Practicing safe sex. ? Taking low-dose aspirin daily starting at age 23. ? Taking vitamin and mineral supplements as recommended by your health care provider. What happens during an annual well check? The services and screenings done by your health care provider during your annual  well check will depend on your age, overall health, lifestyle risk factors, and family history of disease. Counseling Your health care provider may ask you questions about your:  Alcohol use.  Tobacco use.  Drug use.  Emotional well-being.  Home and relationship well-being.  Sexual activity.  Eating habits.  Work and work Statistician.  Method of birth control.  Menstrual cycle.  Pregnancy history.  Screening You may have the following tests or measurements:  Height, weight, and BMI.  Blood pressure.  Lipid and cholesterol levels. These may be checked every 5 years, or more frequently if you are over 67 years old.  Skin check.  Lung cancer screening. You may have this screening every year starting at age 33 if you have a 30-pack-year history of smoking and currently smoke or have quit within the past 15 years.  Fecal occult blood test (FOBT) of the stool. You may have this test every year starting at age 59.  Flexible sigmoidoscopy or colonoscopy. You may have a sigmoidoscopy every 5 years or a colonoscopy every 10 years starting at age 67.  Hepatitis C blood test.  Hepatitis B blood test.  Sexually transmitted disease (STD) testing.  Diabetes screening. This is done by checking your blood sugar (glucose) after you have not eaten for a while (fasting). You may have this done every 1-3 years.  Mammogram. This may be done every 1-2 years. Talk to your health care provider about when you should start having regular mammograms. This may depend on whether you have a family history of breast cancer.  BRCA-related cancer screening. This may be done if you  have a family history of breast, ovarian, tubal, or peritoneal cancers.  Pelvic exam and Pap test. This may be done every 3 years starting at age 22. Starting at age 16, this may be done every 5 years if you have a Pap test in combination with an HPV test.  Bone density scan. This is done to screen for osteoporosis. You may have this scan if you are at high risk for osteoporosis.  Discuss your test results, treatment options, and if necessary, the need for more tests with your health care provider. Vaccines Your health care provider may recommend certain vaccines, such as:  Influenza vaccine. This is recommended every year.  Tetanus, diphtheria, and acellular pertussis (Tdap, Td) vaccine. You may need a Td booster every 10 years.  Varicella vaccine. You may need this if you have not been vaccinated.  Zoster vaccine. You may need this after age 43.  Measles, mumps, and rubella (MMR) vaccine. You may need at least one dose of MMR if you were born in 1957 or later. You may also need a second dose.  Pneumococcal 13-valent conjugate (PCV13) vaccine. You may need this if you have certain conditions and were not previously vaccinated.  Pneumococcal polysaccharide (PPSV23) vaccine. You may need one or two doses if you smoke cigarettes or if you have certain conditions.  Meningococcal vaccine. You may need this if you have certain conditions.  Hepatitis A vaccine. You may need this if you have certain conditions or if you travel or work in places where you may be exposed to hepatitis A.  Hepatitis B vaccine. You may need this if you have certain conditions or if you travel or work in places where you may be exposed to hepatitis B.  Haemophilus influenzae type b (Hib) vaccine. You may need this if you have certain conditions.  Talk to your health care provider  about which screenings and vaccines you need and how often you need them. This information is not intended to replace advice given to  you by your health care provider. Make sure you discuss any questions you have with your health care provider. Document Released: 04/17/2015 Document Revised: 12/09/2015 Document Reviewed: 01/20/2015 Elsevier Interactive Patient Education  2018 [employer].              No follow-ups on file.  Lucretia Kern, DO

## 2017-08-15 ENCOUNTER — Encounter: Payer: Self-pay | Admitting: Family Medicine

## 2017-08-15 ENCOUNTER — Ambulatory Visit (INDEPENDENT_AMBULATORY_CARE_PROVIDER_SITE_OTHER): Payer: BLUE CROSS/BLUE SHIELD | Admitting: Family Medicine

## 2017-08-15 VITALS — BP 112/82 | HR 78 | Temp 98.0°F | Ht 65.5 in | Wt 138.9 lb

## 2017-08-15 DIAGNOSIS — F339 Major depressive disorder, recurrent, unspecified: Secondary | ICD-10-CM

## 2017-08-15 DIAGNOSIS — F439 Reaction to severe stress, unspecified: Secondary | ICD-10-CM | POA: Diagnosis not present

## 2017-08-15 DIAGNOSIS — R030 Elevated blood-pressure reading, without diagnosis of hypertension: Secondary | ICD-10-CM | POA: Diagnosis not present

## 2017-08-15 DIAGNOSIS — Z0001 Encounter for general adult medical examination with abnormal findings: Secondary | ICD-10-CM

## 2017-08-15 DIAGNOSIS — E785 Hyperlipidemia, unspecified: Secondary | ICD-10-CM

## 2017-08-15 DIAGNOSIS — K219 Gastro-esophageal reflux disease without esophagitis: Secondary | ICD-10-CM

## 2017-08-15 DIAGNOSIS — R42 Dizziness and giddiness: Secondary | ICD-10-CM | POA: Diagnosis not present

## 2017-08-15 DIAGNOSIS — Z23 Encounter for immunization: Secondary | ICD-10-CM

## 2017-08-15 DIAGNOSIS — G43809 Other migraine, not intractable, without status migrainosus: Secondary | ICD-10-CM | POA: Diagnosis not present

## 2017-08-15 DIAGNOSIS — R110 Nausea: Secondary | ICD-10-CM

## 2017-08-15 DIAGNOSIS — Z0000 Encounter for general adult medical examination without abnormal findings: Secondary | ICD-10-CM

## 2017-08-15 LAB — BASIC METABOLIC PANEL
BUN: 13 mg/dL (ref 6–23)
CALCIUM: 9.5 mg/dL (ref 8.4–10.5)
CO2: 31 mEq/L (ref 19–32)
CREATININE: 0.84 mg/dL (ref 0.40–1.20)
Chloride: 106 mEq/L (ref 96–112)
GFR: 72.37 mL/min (ref >60.00)
Glucose, Bld: 87 mg/dL (ref 70–99)
POTASSIUM: 4.4 meq/L (ref 3.5–5.1)
Sodium: 145 mEq/L (ref 135–145)

## 2017-08-15 LAB — LIPID PANEL
CHOL/HDL RATIO: 3
CHOLESTEROL: 200 mg/dL (ref 0–200)
HDL: 70.4 mg/dL (ref >39.00)
LDL Cholesterol: 110 mg/dL — ABNORMAL HIGH (ref 0–99)
NONHDL: 129.26
TRIGLYCERIDES: 96 mg/dL (ref 0.0–149.0)
VLDL: 19.2 mg/dL (ref 0.0–40.0)

## 2017-08-15 LAB — CBC
HCT: 45 % (ref 36.0–46.0)
HEMOGLOBIN: 15.1 g/dL — AB (ref 12.0–15.0)
MCHC: 33.6 g/dL (ref 30.0–36.0)
MCV: 95.3 fl (ref 78.0–100.0)
Platelets: 309 10*3/uL (ref 150.0–400.0)
RBC: 4.72 Mil/uL (ref 3.87–5.11)
RDW: 12.6 % (ref 11.5–15.5)
WBC: 3.8 10*3/uL — AB (ref 4.0–10.5)

## 2017-08-15 LAB — HEMOGLOBIN A1C: HEMOGLOBIN A1C: 5.6 % (ref 4.6–6.5)

## 2017-08-15 NOTE — Patient Instructions (Signed)
BEFORE YOU LEAVE: -Tdap -labs -follow up: 3 months (BP, bone density, anxiety)  We have ordered labs or studies at this visit. It can take up to 1-2 weeks for results and processing. IF results require follow up or explanation, we will call you with instructions. Clinically stable results will be released to your [facility]. If you have not heard from [place] or cannot find your results in [facility] in 2 weeks please contact our office at [phone].  If you are not yet signed up for [facility], please consider signing up.  consider stopping some of the supplements (cal-vit D combo, vit E, b complex)  Consider Cognitive behavioral therapy for stress if you can afford or when insurance changes   Preventive Care 40-64 Years, Female Preventive care refers to lifestyle choices and visits with your health care provider that can promote health and wellness. What does preventive care include?  A yearly physical exam. This is also called an annual well check.  Dental exams once or twice a year.  Routine eye exams. Ask your health care provider how often you should have your eyes checked.  Personal lifestyle choices, including: ? Daily care of your teeth and gums. ? Regular physical activity. ? Eating a healthy diet. ? Avoiding tobacco and drug use. ? Limiting alcohol use. ? Practicing safe sex. ? Taking low-dose aspirin daily starting at age 48. ? Taking vitamin and mineral supplements as recommended by your health care provider. What happens during an annual well check? The services and screenings done by your health care provider during your annual well check will depend on your age, overall health, lifestyle risk factors, and family history of disease. Counseling Your health care provider may ask you questions about your:  Alcohol use.  Tobacco use.  Drug use.  Emotional well-being.  Home and relationship well-being.  Sexual activity.  Eating habits.  Work and work  Statistician.  Method of birth control.  Menstrual cycle.  Pregnancy history.  Screening You may have the following tests or measurements:  Height, weight, and BMI.  Blood pressure.  Lipid and cholesterol levels. These may be checked every 5 years, or more frequently if you are over 19 years old.  Skin check.  Lung cancer screening. You may have this screening every year starting at age 20 if you have a 30-pack-year history of smoking and currently smoke or have quit within the past 15 years.  Fecal occult blood test (FOBT) of the stool. You may have this test every year starting at age 36.  Flexible sigmoidoscopy or colonoscopy. You may have a sigmoidoscopy every 5 years or a colonoscopy every 10 years starting at age 20.  Hepatitis C blood test.  Hepatitis B blood test.  Sexually transmitted disease (STD) testing.  Diabetes screening. This is done by checking your blood sugar (glucose) after you have not eaten for a while (fasting). You may have this done every 1-3 years.  Mammogram. This may be done every 1-2 years. Talk to your health care provider about when you should start having regular mammograms. This may depend on whether you have a family history of breast cancer.  BRCA-related cancer screening. This may be done if you have a family history of breast, ovarian, tubal, or peritoneal cancers.  Pelvic exam and Pap test. This may be done every 3 years starting at age 7. Starting at age 46, this may be done every 5 years if you have a Pap test in combination with an HPV test.  Bone density scan. This is done to screen for osteoporosis. You may have this scan if you are at high risk for osteoporosis.  Discuss your test results, treatment options, and if necessary, the need for more tests with your health care provider. Vaccines Your health care provider may recommend certain vaccines, such as:  Influenza vaccine. This is recommended every year.  Tetanus,  diphtheria, and acellular pertussis (Tdap, Td) vaccine. You may need a Td booster every 10 years.  Varicella vaccine. You may need this if you have not been vaccinated.  Zoster vaccine. You may need this after age 55.  Measles, mumps, and rubella (MMR) vaccine. You may need at least one dose of MMR if you were born in 1957 or later. You may also need a second dose.  Pneumococcal 13-valent conjugate (PCV13) vaccine. You may need this if you have certain conditions and were not previously vaccinated.  Pneumococcal polysaccharide (PPSV23) vaccine. You may need one or two doses if you smoke cigarettes or if you have certain conditions.  Meningococcal vaccine. You may need this if you have certain conditions.  Hepatitis A vaccine. You may need this if you have certain conditions or if you travel or work in places where you may be exposed to hepatitis A.  Hepatitis B vaccine. You may need this if you have certain conditions or if you travel or work in places where you may be exposed to hepatitis B.  Haemophilus influenzae type b (Hib) vaccine. You may need this if you have certain conditions.  Talk to your health care provider about which screenings and vaccines you need and how often you need them. This information is not intended to replace advice given to you by your health care provider. Make sure you discuss any questions you have with your health care provider. Document Released: 04/17/2015 Document Revised: 12/09/2015 Document Reviewed: 01/20/2015 Elsevier Interactive Patient Education  [employer].

## 2017-08-15 NOTE — Addendum Note (Signed)
Addended by: Agnes Lawrence on: 08/15/2017 10:27 AM   Modules accepted: Orders

## 2017-08-16 ENCOUNTER — Encounter: Payer: Self-pay | Admitting: Family Medicine

## 2017-08-17 ENCOUNTER — Other Ambulatory Visit: Payer: Self-pay | Admitting: *Deleted

## 2017-08-17 ENCOUNTER — Encounter: Payer: BLUE CROSS/BLUE SHIELD | Admitting: Family Medicine

## 2017-08-17 DIAGNOSIS — G43009 Migraine without aura, not intractable, without status migrainosus: Secondary | ICD-10-CM

## 2017-08-17 MED ORDER — BUPROPION HCL ER (SR) 150 MG PO TB12: 150.0000 mg | ORAL_TABLET | Freq: Two times a day (BID) | ORAL | 5 refills | Status: DC

## 2017-08-17 MED ORDER — SUMATRIPTAN SUCCINATE 100 MG PO TABS: ORAL_TABLET | ORAL | 3 refills | Status: DC

## 2017-08-17 NOTE — Telephone Encounter (Signed)
Patient called back and was informed of the results in greater detail.  She stated she will call back for a follow up visit in 3 months and is aware refills were sent to her pharmacy.

## 2017-08-23 NOTE — Progress Notes (Signed)
[name] [address] [place], [place] [address] Phone: [phone] Subjective:     CC: Neck pain  [id number]  Lindsay [name] is a 67 y.o. female coming in with complaint of cervical spine pain. No changes since last visit. She is here for manipulation today.  Still has some radicular symptoms as well as some mild movements but no weakness.  Is been responding fairly well to manipulation.  Discussed icing regimen and home exercise.  Has been doing it repetitively.  Not working as much when she got we did help more than it is     Past Medical History:  Diagnosis Date  . COMMON MIGRAINE 09/20/2006  . DEPRESSION 06/11/2009  . GERD 06/11/2009  . Neck pain    eval with sports medicine in 2016/17   Past Surgical History:  Procedure Laterality Date  . ABDOMINAL HYSTERECTOMY    . CHOLECYSTECTOMY    . OVARIAN CYST REMOVAL    . REPAIR EXTENSOR TENDON HAND    . TONSILLECTOMY     Social History   Socioeconomic History  . Marital status: Married    Spouse name: Not on file  . Number of children: Not on file  . Years of education: Not on file  . Highest education level: Not on file  Occupational History  . Not on file  Social Needs  . Financial resource strain: Not on file  . Food insecurity:    Worry: Not on file    Inability: Not on file  . Transportation needs:    Medical: Not on file    Non-medical: Not on file  Tobacco Use  . Smoking status: Former Smoker    Types: Cigarettes    Last attempt to quit: 04/04/2004    Years since quitting: 13.3  . Smokeless tobacco: Never Used  Substance and Sexual Activity  . Alcohol use: Yes    Alcohol/week: 0.0 oz    Comment: occ  . Drug use: No  . Sexual activity: Not on file  Lifestyle  . Physical activity:    Days per week: Not on file    Minutes per session: Not on file  . Stress: Not on file  Relationships  . Social connections:    Talks on phone: Not on file    Gets together: Not on file   Attends religious service: Not on file    Active member of club or organization: Not on file    Attends meetings of clubs or organizations: Not on file    Relationship status: Not on file  Other Topics Concern  . Not on file  Social History Narrative  . Not on file   No Known Allergies Family History  Problem Relation Age of Onset  . Dementia Mother   . Osteoporosis Mother   . Breast cancer Sister   . Heart attack Brother   . Colon cancer Neg Hx   . Esophageal cancer Neg Hx   . Rectal cancer Neg Hx   . Stomach cancer Neg Hx      Past medical history, social, surgical and family history all reviewed in electronic medical record.  No pertanent information unless stated regarding to the chief complaint.   Review of Systems:Review of systems updated and as accurate as of 08/24/17  No headache, visual changes, nausea, vomiting, diarrhea, constipation, dizziness, abdominal pain, skin rash, fevers, chills, night sweats, weight loss, swollen lymph nodes, body aches, joint swelling, chest pain, shortness of breath, mood changes.  Positive muscle aches   Objective  Blood pressure 120/78, pulse 60, height 5' 5.5" (1.664 m), weight 138 lb (62.6 kg), SpO2 94 %. Systems examined below as of 08/24/17   General: No apparent distress alert and oriented x3 mood and affect normal, dressed appropriately.  HEENT: Pupils equal, extraocular movements intact  Respiratory: Patient's speak in full sentences and does not appear short of breath  Cardiovascular: No lower extremity edema, non tender, no erythema  Skin: Warm dry intact with no signs of infection or rash on extremities or on axial skeleton.  Abdomen: Soft nontender  Neuro: Cranial nerves II through XII are intact, neurovascularly intact in all extremities with 2+ DTRs and 2+ pulses.  Lymph: No lymphadenopathy of posterior or anterior cervical chain or axillae bilaterally.  Gait normal with good balance and coordination.  MSK:  Non tender  with full range of motion and good stability and symmetric strength and tone of shoulders, elbows, wrist, hip, knee and ankles bilaterally.  Arthritic changes  Neck: Inspection loss of lordosis. No palpable stepoffs. Mild positive Spurling's maneuver. Limited range of motion by 5 to 10 degrees in all planes Grip strength and sensation normal in bilateral hands Strength good C4 to T1 distribution Negative Hoffman sign bilaterally And some mild sensory deficit in the fingertips bilaterally  Osteopathic findings C2 flexed rotated and side bent right C4 flexed rotated and side bent left C6 flexed rotated and side bent left T3 extended rotated and side bent right inhaled third rib L3 flexed rotated and side bent right Sacrum right on right     Impression and Recommendations:     This case required medical decision making of moderate complexity.      Note: This dictation was prepared with Dragon dictation along with smaller phrase technology. Any transcriptional errors that result from this process are unintentional.

## 2017-08-24 ENCOUNTER — Encounter: Payer: Self-pay | Admitting: Family Medicine

## 2017-08-24 ENCOUNTER — Ambulatory Visit: Payer: BLUE CROSS/BLUE SHIELD | Admitting: Family Medicine

## 2017-08-24 VITALS — BP 120/78 | HR 60 | Ht 65.5 in | Wt 138.0 lb

## 2017-08-24 DIAGNOSIS — M5030 Other cervical disc degeneration, unspecified cervical region: Secondary | ICD-10-CM | POA: Diagnosis not present

## 2017-08-24 DIAGNOSIS — M999 Biomechanical lesion, unspecified: Secondary | ICD-10-CM | POA: Diagnosis not present

## 2017-08-24 NOTE — Assessment & Plan Note (Signed)
Known degenerative disc disease with radicular symptoms.  Stable but no significant improvement.  Discussed again about posture and ergonomics.  Discussed which activities doing which wants to avoid.  Patient will increase activity slowly.  Patient will be decreasing work again and likely will be contributing to some of the discomfort.  Follow-up with me again in 3-4 weeks

## 2017-08-24 NOTE — Patient Instructions (Signed)
Good to see you  You know the drill  See me again in 3 weeks

## 2017-08-24 NOTE — Assessment & Plan Note (Signed)
Decision today to treat with OMT was based on Physical Exam  After verbal consent patient was treated with HVLA, ME, FPR techniques in cervical, thoracic, rib lumbar and sacral areas  Patient tolerated the procedure well with improvement in symptoms  Patient given exercises, stretches and lifestyle modifications  See medications in patient instructions if given  Patient will follow up in 3-4 weeks 

## 2017-09-12 ENCOUNTER — Other Ambulatory Visit: Payer: Self-pay | Admitting: Family Medicine

## 2017-09-12 DIAGNOSIS — K219 Gastro-esophageal reflux disease without esophagitis: Secondary | ICD-10-CM

## 2017-09-19 NOTE — Progress Notes (Signed)
[name] [place], [name] [address] Phone: [phone] Subjective:    CC: Neck pain follow-up  [id number]  Lindsay [name] is a 67 y.o. female coming in with complaint of neck pain.  Does have degenerative disc disease in multiple levels.  Still has some radicular symptoms.  Some increasing stress.    Past Medical History:  Diagnosis Date  . COMMON MIGRAINE 09/20/2006  . DEPRESSION 06/11/2009  . GERD 06/11/2009  . Neck pain    eval with sports medicine in 2016/17   Past Surgical History:  Procedure Laterality Date  . ABDOMINAL HYSTERECTOMY    . CHOLECYSTECTOMY    . OVARIAN CYST REMOVAL    . REPAIR EXTENSOR TENDON HAND    . TONSILLECTOMY     Social History   Socioeconomic History  . Marital status: Married    Spouse name: Not on file  . Number of children: Not on file  . Years of education: Not on file  . Highest education level: Not on file  Occupational History  . Not on file  Social Needs  . Financial resource strain: Not on file  . Food insecurity:    Worry: Not on file    Inability: Not on file  . Transportation needs:    Medical: Not on file    Non-medical: Not on file  Tobacco Use  . Smoking status: Former Smoker    Types: Cigarettes    Last attempt to quit: 04/04/2004    Years since quitting: 13.4  . Smokeless tobacco: Never Used  Substance and Sexual Activity  . Alcohol use: Yes    Alcohol/week: 0.0 oz    Comment: occ  . Drug use: No  . Sexual activity: Not on file  Lifestyle  . Physical activity:    Days per week: Not on file    Minutes per session: Not on file  . Stress: Not on file  Relationships  . Social connections:    Talks on phone: Not on file    Gets together: Not on file    Attends religious service: Not on file    Active member of club or organization: Not on file    Attends meetings of clubs or organizations: Not on file    Relationship status: Not on file  Other Topics Concern  .  Not on file  Social History Narrative  . Not on file   No Known Allergies Family History  Problem Relation Age of Onset  . Dementia Mother   . Osteoporosis Mother   . Breast cancer Sister   . Heart attack Brother   . Colon cancer Neg Hx   . Esophageal cancer Neg Hx   . Rectal cancer Neg Hx   . Stomach cancer Neg Hx      Past medical history, social, surgical and family history all reviewed in electronic medical record.  No pertanent information unless stated regarding to the chief complaint.   Review of Systems:Review of systems updated and as accurate as of 09/20/17  No headache, visual changes, nausea, vomiting, diarrhea, constipation, dizziness, abdominal pain, skin rash, fevers, chills, night sweats, weight loss, swollen lymph nodes, body aches, joint swelling,  chest pain, shortness of breath, mood changes.  Positive muscle aches  Objective  Blood pressure 138/88, pulse 75, height 5' 5.5" (1.664 m), weight 138 lb (62.6 kg), SpO2 98 %. Systems examined below as of 09/20/17   General: No apparent distress alert and oriented x3 mood  and affect normal, dressed appropriately.  HEENT: Pupils equal, extraocular movements intact  Respiratory: Patient's speak in full sentences and does not appear short of breath  Cardiovascular: No lower extremity edema, non tender, no erythema  Skin: Warm dry intact with no signs of infection or rash on extremities or on axial skeleton.  Abdomen: Soft nontender  Neuro: Cranial nerves II through XII are intact, neurovascularly intact in all extremities with 2+ DTRs and 2+ pulses.  Lymph: No lymphadenopathy of posterior or anterior cervical chain or axillae bilaterally.  Gait normal with good balance and coordination.  MSK:  Non tender with full range of motion and good stability and symmetric strength and tone of shoulders, elbows, wrist, hip, knee and ankles bilaterally.  Neck: Inspection loss of lordosis. No palpable stepoffs. Negative  Spurling's maneuver. Mild limitation in all planes with side bending and rotation of the neck Grip strength and sensation normal in bilateral hands Strength good C4 to T1 distribution No sensory change to C4 to T1 Negative Hoffman sign bilaterally Reflexes normal Tightness of the trapezius bilaterally  Osteopathic findings C2 flexed rotated and side bent right C4 flexed rotated and side bent left C7 flexed rotated and side bent right T3 extended rotated and side bent right inhaled third rib T9 extended rotated and side bent left     Impression and Recommendations:     This case required medical decision making of moderate complexity.      Note: This dictation was prepared with Dragon dictation along with smaller phrase technology. Any transcriptional errors that result from this process are unintentional.

## 2017-09-20 ENCOUNTER — Ambulatory Visit: Payer: BLUE CROSS/BLUE SHIELD | Admitting: Family Medicine

## 2017-09-20 ENCOUNTER — Encounter: Payer: Self-pay | Admitting: Family Medicine

## 2017-09-20 VITALS — BP 138/88 | HR 75 | Ht 65.5 in | Wt 138.0 lb

## 2017-09-20 DIAGNOSIS — M999 Biomechanical lesion, unspecified: Secondary | ICD-10-CM | POA: Diagnosis not present

## 2017-09-20 DIAGNOSIS — M5030 Other cervical disc degeneration, unspecified cervical region: Secondary | ICD-10-CM

## 2017-09-20 NOTE — Assessment & Plan Note (Addendum)
Decision today to treat with OMT was based on Physical Exam  After verbal consent patient was treated with HVLA, ME, FPR techniques in cervical, thoracic, rib areas  Patient tolerated the procedure well with improvement in symptoms  Patient given exercises, stretches and lifestyle modifications  See medications in patient instructions if given  Patient will follow up in 4 weeks 

## 2017-09-20 NOTE — Patient Instructions (Signed)
Good to see you as always  You will do great  See me again in 4 weeks

## 2017-09-20 NOTE — Assessment & Plan Note (Signed)
Stable overall.  Discussed icing regimen and home exercises.  Discussed which activities of doing which wants to avoid.  Was to increase activity as tolerated.  Patient has declined to different medications or any other further aggressive therapy such as epidural.  Patient does not have any numbness noted on exam today.  Patient will follow-up with me again 4 weeks.

## 2017-10-18 NOTE — Progress Notes (Signed)
[name] [place], [place] [address] Phone: [phone] Subjective:     CC: Neck pain follow-up  [id number]  Lindsay [name] is a 67 y.o. female coming in with complaint of neck pain.  Known to have cervical degenerative disc disease.  Patient has been given different medications and has not been able to tolerate them well.  Patient has responded somewhat to an epidural.  Worsening numbness in the hands bilaterally.  Patient states that the pain in the neck though overall seems to be improved    Past Medical History:  Diagnosis Date  . COMMON MIGRAINE 09/20/2006  . DEPRESSION 06/11/2009  . GERD 06/11/2009  . Neck pain    eval with sports medicine in 2016/17   Past Surgical History:  Procedure Laterality Date  . ABDOMINAL HYSTERECTOMY    . CHOLECYSTECTOMY    . OVARIAN CYST REMOVAL    . REPAIR EXTENSOR TENDON HAND    . TONSILLECTOMY     Social History   Socioeconomic History  . Marital status: Married    Spouse name: Not on file  . Number of children: Not on file  . Years of education: Not on file  . Highest education level: Not on file  Occupational History  . Not on file  Social Needs  . Financial resource strain: Not on file  . Food insecurity:    Worry: Not on file    Inability: Not on file  . Transportation needs:    Medical: Not on file    Non-medical: Not on file  Tobacco Use  . Smoking status: Former Smoker    Types: Cigarettes    Last attempt to quit: 04/04/2004    Years since quitting: 13.5  . Smokeless tobacco: Never Used  Substance and Sexual Activity  . Alcohol use: Yes    Alcohol/week: 0.0 oz    Comment: occ  . Drug use: No  . Sexual activity: Not on file  Lifestyle  . Physical activity:    Days per week: Not on file    Minutes per session: Not on file  . Stress: Not on file  Relationships  . Social connections:    Talks on phone: Not on file    Gets together: Not on file    Attends religious  service: Not on file    Active member of club or organization: Not on file    Attends meetings of clubs or organizations: Not on file    Relationship status: Not on file  Other Topics Concern  . Not on file  Social History Narrative  . Not on file   No Known Allergies Family History  Problem Relation Age of Onset  . Dementia Mother   . Osteoporosis Mother   . Breast cancer Sister   . Heart attack Brother   . Colon cancer Neg Hx   . Esophageal cancer Neg Hx   . Rectal cancer Neg Hx   . Stomach cancer Neg Hx      Past medical history, social, surgical and family history all reviewed in electronic medical record.  No pertanent information unless stated regarding to the chief complaint.   Review of Systems:Review of systems updated and as accurate as of 10/19/17  No headache, visual changes, nausea, vomiting, diarrhea, constipation, dizziness, abdominal pain, skin rash, fevers, chills, night sweats, weight loss, swollen lymph nodes, body aches, joint swelling, muscle aches, chest pain, shortness of breath, mood changes.   Objective  Blood  pressure 126/80, pulse 61, height 5' 5.5" (1.664 m), weight 138 lb (62.6 kg), SpO2 95 %. Systems examined below as of 10/19/17   General: No apparent distress alert and oriented x3 mood and affect normal, dressed appropriately.  HEENT: Pupils equal, extraocular movements intact  Respiratory: Patient's speak in full sentences and does not appear short of breath  Cardiovascular: No lower extremity edema, non tender, no erythema  Skin: Warm dry intact with no signs of infection or rash on extremities or on axial skeleton.  Abdomen: Soft nontender  Neuro: Cranial nerves II through XII are intact, neurovascularly intact in all extremities with 2+ DTRs and 2+ pulses.  Lymph: No lymphadenopathy of posterior or anterior cervical chain or axillae bilaterally.  Gait normal with good balance and coordination.  MSK:  Non tender with full range of motion and  good stability and symmetric strength and tone of shoulders, elbows, wrist, hip, knee and ankles bilaterally.  Neck: Inspection loss of lordosis.Marland Kitchen No palpable stepoffs. Negative Spurling's maneuver. Patient does have crepitus with some range of motion is lacking 10 degrees of sidebending bilaterally. Grip strength and sensation normal in bilateral hands Strength good C4 to T1 distribution No sensory change to C4 to T1 Negative Hoffman sign bilaterally Reflexes normal Tightness of the trapezius bilaterally  Osteopathic findings C2 flexed rotated and side bent right C4 flexed rotated and side bent left C7 flexed rotated and side bent left T3 extended rotated and side bent right inhaled third rib T7 extended rotated and side bent left      Impression and Recommendations:     This case required medical decision making of moderate complexity.      Note: This dictation was prepared with Dragon dictation along with smaller phrase technology. Any transcriptional errors that result from this process are unintentional.

## 2017-10-19 ENCOUNTER — Ambulatory Visit (INDEPENDENT_AMBULATORY_CARE_PROVIDER_SITE_OTHER): Payer: Medicare Other | Admitting: Family Medicine

## 2017-10-19 ENCOUNTER — Encounter: Payer: Self-pay | Admitting: Family Medicine

## 2017-10-19 VITALS — BP 126/80 | HR 61 | Ht 65.5 in | Wt 138.0 lb

## 2017-10-19 DIAGNOSIS — M5030 Other cervical disc degeneration, unspecified cervical region: Secondary | ICD-10-CM | POA: Diagnosis not present

## 2017-10-19 DIAGNOSIS — M999 Biomechanical lesion, unspecified: Secondary | ICD-10-CM | POA: Diagnosis not present

## 2017-10-19 NOTE — Assessment & Plan Note (Signed)
Patient does have some degenerative disc disease.  Patient continues to have some radicular symptoms.  We discussed the possibility of doing carpal tunnel injections to help diagnostically and potentially therapeutically.  Did respond to an epidural previously and may want to consider another one which patient has declined.  Continues to respond fairly well to osteopathic manipulation.  Follow-up again in 4 to 8 weeks

## 2017-10-19 NOTE — Assessment & Plan Note (Signed)
Decision today to treat with OMT was based on Physical Exam  After verbal consent patient was treated with HVLA, ME, FPR techniques in cervical, thoracic, rib areas  Patient tolerated the procedure well with improvement in symptoms  Patient given exercises, stretches and lifestyle modifications  See medications in patient instructions if given  Patient will follow up in 4-8 weeks 

## 2017-11-07 ENCOUNTER — Encounter: Payer: Self-pay | Admitting: Family Medicine

## 2017-11-07 ENCOUNTER — Other Ambulatory Visit (HOSPITAL_COMMUNITY)
Admission: RE | Admit: 2017-11-07 | Discharge: 2017-11-07 | Disposition: A | Discharge status: Home / Self Care | Payer: Medicare Other | Source: Ambulatory Visit | Attending: Family Medicine | Admitting: Family Medicine

## 2017-11-07 ENCOUNTER — Ambulatory Visit (INDEPENDENT_AMBULATORY_CARE_PROVIDER_SITE_OTHER): Payer: Medicare Other | Admitting: Family Medicine

## 2017-11-07 VITALS — BP 120/78 | HR 70 | Temp 97.7°F | Ht 65.5 in | Wt 135.9 lb

## 2017-11-07 DIAGNOSIS — G8929 Other chronic pain: Secondary | ICD-10-CM | POA: Insufficient documentation

## 2017-11-07 DIAGNOSIS — K219 Gastro-esophageal reflux disease without esophagitis: Secondary | ICD-10-CM | POA: Insufficient documentation

## 2017-11-07 DIAGNOSIS — N760 Acute vaginitis: Secondary | ICD-10-CM

## 2017-11-07 DIAGNOSIS — F439 Reaction to severe stress, unspecified: Secondary | ICD-10-CM

## 2017-11-07 DIAGNOSIS — G43909 Migraine, unspecified, not intractable, without status migrainosus: Secondary | ICD-10-CM | POA: Insufficient documentation

## 2017-11-07 DIAGNOSIS — R030 Elevated blood-pressure reading, without diagnosis of hypertension: Secondary | ICD-10-CM

## 2017-11-07 DIAGNOSIS — F329 Major depressive disorder, single episode, unspecified: Secondary | ICD-10-CM | POA: Diagnosis not present

## 2017-11-07 DIAGNOSIS — M542 Cervicalgia: Secondary | ICD-10-CM | POA: Diagnosis not present

## 2017-11-07 MED ORDER — PANTOPRAZOLE SODIUM 40 MG PO TBEC: 40.0000 mg | DELAYED_RELEASE_TABLET | Freq: Every day | ORAL | 3 refills | Status: DC

## 2017-11-07 NOTE — Patient Instructions (Signed)
BEFORE YOU LEAVE: -follow up: Welcome to Medicare with Dr. Maudie Mercury in 3 months, labs then

## 2017-11-07 NOTE — Progress Notes (Signed)
HPI:  Using dictation device. Unfortunately this device frequently misinterprets words/phrases.  Lindsay [name] is a pleasant 67 y.o. here for follow up. Chronic medical problems summarized below were reviewed for changes and stability and were updated as needed below. These issues and their treatment remain stable for the most part. Reports is doing better. Took ppi for a few weeks and symptoms resolved, now using prn and requests refill. Working on healthy lifestyle and lost some weight. Has new concern today of vulvar pruritis/iritation for a few days. Monistat helped. Wants to check today. No dysuria, fevers, bleeding, discharge. Hx hysterectomy. Denies CP, SOB, DOE, treatment intolerance or new symptoms.  Elevated BP: -she preferred to monitor, feels is related to stress -she feels is stress - better since work ended (company [employer]) -looking for job in summer 2019  GERD: -uses PPI sometimes for chronic GERD  -rare dysphagia, refused GI evaluation in the past,  -hx dilation of esophagus remotely -stable  Migraines: -Uses prn triptan, magnesium  Depression and stress: Chronic depression, treated with Wellbutrin; sport med rxd trazadone as well Reports mood ok See hpi  Mild hyperlipidemia: -No regular exercise -Reports a healthy diet -prefers to take niacin and fish oil rather then medication for this  Chronic neck pain: -sees sports medicine for managment  ROS: See pertinent positives and negatives per HPI.  Past Medical History:  Diagnosis Date  . COMMON MIGRAINE 09/20/2006  . DEPRESSION 06/11/2009  . GERD 06/11/2009  . Neck pain    eval with sports medicine in 2016/17    Past Surgical History:  Procedure Laterality Date  . ABDOMINAL HYSTERECTOMY    . CHOLECYSTECTOMY    . OVARIAN CYST REMOVAL    . REPAIR EXTENSOR TENDON HAND    . TONSILLECTOMY      Family History  Problem Relation Age of Onset  . Dementia Mother   . Osteoporosis Mother   . Breast cancer  Sister   . Heart attack Brother   . Colon cancer Neg Hx   . Esophageal cancer Neg Hx   . Rectal cancer Neg Hx   . Stomach cancer Neg Hx     SOCIAL HX: see hpi   Current Outpatient Medications:  .  buPROPion (WELLBUTRIN SR) 150 MG 12 hr tablet, Take 1 tablet (150 mg total) by mouth 2 (two) times daily., Disp: 60 tablet, Rfl: 5 .  Cholecalciferol (VITAMIN D3) 1000 UNITS capsule, Take 1,000 Units by mouth daily.  , Disp: , Rfl:  .  clindamycin (CLINDAGEL) 1 % gel, Apply topically 2 (two) times daily., Disp: 30 g, Rfl: 3 .  diphenhydramine-acetaminophen (TYLENOL PM) 25-500 MG TABS, Take 1 tablet by mouth at bedtime as needed., Disp: , Rfl:  .  Docusate Sodium (COLACE PO), Take by mouth., Disp: , Rfl:  .  fish oil-omega-3 fatty acids 1000 MG capsule, Take 1 g by mouth daily., Disp: , Rfl:  .  MAGNESIUM PO, Take by mouth., Disp: , Rfl:  .  Misc Natural Products (TART CHERRY ADVANCED PO), Take by mouth daily., Disp: , Rfl:  .  niacin 250 MG tablet, Take 250 mg by mouth daily with breakfast.  , Disp: , Rfl:  .  pantoprazole (PROTONIX) 40 MG tablet, Take 1 tablet (40 mg total) by mouth daily., Disp: 30 tablet, Rfl: 3 .  SUMAtriptan (IMITREX) 100 MG tablet, TAKE 1/2-1 TABLET BY MOUTH AT ONSET MIGRAINE,MAY REPEAT ONCE 2HOURS IF NEED,MAX 2DOSES IN 24 HOURS, Disp: 9 tablet, Rfl: 3 .  traZODone (DESYREL)  50 MG tablet, Take 0.5-1 tablets (25-50 mg total) by mouth at bedtime as needed for sleep., Disp: 30 tablet, Rfl: 3 .  Tretinoin, Facial Wrinkles, (TRETINOIN, EMOLLIENT,) 0.05 % CREA, Apply a small amount to affected area nightly, Disp: 20 g, Rfl: 1  EXAM:  Vitals:   11/07/17 1125  BP: 120/78  Pulse: 70  Temp: 97.7 F (36.5 C)    Body mass index is 22.27 kg/m.  GENERAL: vitals reviewed and listed above, alert, oriented, appears well hydrated and in no acute distress  HEENT: atraumatic, conjunttiva clear, no obvious abnormalities on inspection of external nose and ears  NECK: no obvious  masses on inspection  LUNGS: clear to auscultation bilaterally, no wheezes, rales or rhonchi, good air movement  CV: HRRR, no peripheral edema  GU: mild irritation vulvar skin, o/w normal exam  MS: moves all extremities without noticeable abnormality  PSYCH: pleasant and cooperative, no obvious depression or anxiety  ASSESSMENT AND PLAN:  Discussed the following assessment and plan:  Gastroesophageal reflux disease without esophagitis - Plan: pantoprazole (PROTONIX) 40 MG tablet  Vaginitis and vulvovaginitis - Plan: Cervicovaginal ancillary only  Elevated blood pressure reading  Stress  -BP improved, she prefers to work on lifestyle -rx for prn PPI provided, glad she is better -suspect mild yeast vulvitis - opted for OTC tx and advised to call if not resolved in 1 week -vaginal testing pending, return precaution advised -due for AWV and agrees to schedue  Patient Instructions  BEFORE YOU LEAVE: -follow up: Welcome to Medicare with Dr. Maudie Mercury in 3 months, labs then      Lucretia Kern, DO

## 2017-11-08 LAB — CERVICOVAGINAL ANCILLARY ONLY
Bacterial vaginitis: NEGATIVE
CANDIDA VAGINITIS: NEGATIVE
CHLAMYDIA, DNA PROBE: NEGATIVE
NEISSERIA GONORRHEA: NEGATIVE
TRICH (WINDOWPATH): NEGATIVE

## 2017-11-16 ENCOUNTER — Ambulatory Visit: Payer: Medicare Other | Admitting: Family Medicine

## 2017-11-17 ENCOUNTER — Encounter: Payer: Self-pay | Admitting: Family Medicine

## 2017-11-17 MED ORDER — FLUCONAZOLE 150 MG PO TABS: 150.0000 mg | ORAL_TABLET | Freq: Once | ORAL | 0 refills | Status: AC

## 2017-12-06 ENCOUNTER — Encounter: Payer: Self-pay | Admitting: Family Medicine

## 2017-12-07 ENCOUNTER — Encounter: Payer: Self-pay | Admitting: Family Medicine

## 2017-12-07 ENCOUNTER — Ambulatory Visit (INDEPENDENT_AMBULATORY_CARE_PROVIDER_SITE_OTHER): Payer: Medicare Other | Admitting: Family Medicine

## 2017-12-07 VITALS — BP 120/80 | HR 70 | Temp 98.3°F | Ht 65.5 in | Wt 138.6 lb

## 2017-12-07 DIAGNOSIS — K602 Anal fissure, unspecified: Secondary | ICD-10-CM | POA: Diagnosis not present

## 2017-12-07 DIAGNOSIS — B372 Candidiasis of skin and nail: Secondary | ICD-10-CM | POA: Diagnosis not present

## 2017-12-07 DIAGNOSIS — E2839 Other primary ovarian failure: Secondary | ICD-10-CM

## 2017-12-07 MED ORDER — FLUCONAZOLE 150 MG PO TABS: 150.0000 mg | ORAL_TABLET | ORAL | 0 refills | Status: AC

## 2017-12-07 MED ORDER — HYDROCORTISONE 2.5 % RE CREA: 1.0000 "application " | TOPICAL_CREAM | Freq: Two times a day (BID) | RECTAL | 0 refills | Status: DC

## 2017-12-07 NOTE — Patient Instructions (Addendum)
Lindsay [name], please review/update health maintenance with her. Thanks!  Diflucan once daily for 3 days, then repeat on tablet in 1 week if needed.  Topical clotrimazole cream mixed with aquaphor 1-2 times daily for 3 weeks. These are available over the counter.  Topical Anusol for the tear for 1 week.  I hope you are feeling better soon! Follow up if your symptoms worsen, new concerns arise or you are not improving with treatment.

## 2017-12-07 NOTE — Telephone Encounter (Signed)
I called the pt and scheduled an appt for today at 2pm.

## 2017-12-07 NOTE — Addendum Note (Signed)
Addended by: Agnes Lawrence on: 12/07/2017 02:33 PM   Modules accepted: Orders

## 2017-12-07 NOTE — Progress Notes (Signed)
HPI:  Using dictation device. Unfortunately this device frequently misinterprets words/phrases.  Acute visit for vulvovaginal irritation and perianal irritation. 1 tablet of diflucan from my collegue seemed to help then returned. Denies rash elsewhere, abx use, fevers, malaise, bowel issues, bleeding.  ROS: See pertinent positives and negatives per HPI.  Past Medical History:  Diagnosis Date  . COMMON MIGRAINE 09/20/2006  . DEPRESSION 06/11/2009  . GERD 06/11/2009  . Neck pain    eval with sports medicine in 2016/17    Past Surgical History:  Procedure Laterality Date  . ABDOMINAL HYSTERECTOMY    . CHOLECYSTECTOMY    . OVARIAN CYST REMOVAL    . REPAIR EXTENSOR TENDON HAND    . TONSILLECTOMY      Family History  Problem Relation Age of Onset  . Dementia Mother   . Osteoporosis Mother   . Breast cancer Sister   . Heart attack Brother   . Colon cancer Neg Hx   . Esophageal cancer Neg Hx   . Rectal cancer Neg Hx   . Stomach cancer Neg Hx     SOCIAL HX: see hpi   Current Outpatient Medications:  .  buPROPion (WELLBUTRIN SR) 150 MG 12 hr tablet, Take 1 tablet (150 mg total) by mouth 2 (two) times daily., Disp: 60 tablet, Rfl: 5 .  Cholecalciferol (VITAMIN D3) 1000 UNITS capsule, Take 1,000 Units by mouth daily.  , Disp: , Rfl:  .  clindamycin (CLINDAGEL) 1 % gel, Apply topically 2 (two) times daily., Disp: 30 g, Rfl: 3 .  diphenhydramine-acetaminophen (TYLENOL PM) 25-500 MG TABS, Take 1 tablet by mouth at bedtime as needed., Disp: , Rfl:  .  Docusate Sodium (COLACE PO), Take by mouth., Disp: , Rfl:  .  fish oil-omega-3 fatty acids 1000 MG capsule, Take 1 g by mouth daily., Disp: , Rfl:  .  MAGNESIUM PO, Take by mouth., Disp: , Rfl:  .  Misc Natural Products (TART CHERRY ADVANCED PO), Take by mouth daily., Disp: , Rfl:  .  niacin 250 MG tablet, Take 250 mg by mouth daily with breakfast.  , Disp: , Rfl:  .  pantoprazole (PROTONIX) 40 MG tablet, Take 1 tablet (40 mg total) by  mouth daily., Disp: 30 tablet, Rfl: 3 .  SUMAtriptan (IMITREX) 100 MG tablet, TAKE 1/2-1 TABLET BY MOUTH AT ONSET MIGRAINE,MAY REPEAT ONCE 2HOURS IF NEED,MAX 2DOSES IN 24 HOURS, Disp: 9 tablet, Rfl: 3 .  traZODone (DESYREL) 50 MG tablet, Take 0.5-1 tablets (25-50 mg total) by mouth at bedtime as needed for sleep., Disp: 30 tablet, Rfl: 3 .  Tretinoin, Facial Wrinkles, (TRETINOIN, EMOLLIENT,) 0.05 % CREA, Apply a small amount to affected area nightly, Disp: 20 g, Rfl: 1 .  fluconazole (DIFLUCAN) 150 MG tablet, Take 1 tablet (150 mg total) by mouth 1 day or 1 dose for 1 dose., Disp: 5 tablet, Rfl: 0 .  hydrocortisone (ANUSOL-HC) 2.5 % rectal cream, Place 1 application rectally 2 (two) times daily., Disp: 30 g, Rfl: 0  EXAM:  Vitals:   12/07/17 1358  BP: 120/80  Pulse: 70  Temp: 98.3 F (36.8 C)    Body mass index is 22.71 kg/m.  GENERAL: vitals reviewed and listed above, alert, oriented, appears well hydrated and in no acute distress  HEENT: atraumatic, conjunttiva clear, no obvious abnormalities on inspection of external nose and ears  NECK: no obvious masses on inspection  GU/RECTAL: sharply demarcated mildly irritated erythematous skin in gluteal cleft and extending to and involving the labia  mag/minora as well. Two small superficial perianal fissures.  MS: moves all extremities without noticeable abnormality  PSYCH: pleasant and cooperative, no obvious depression or anxiety  ASSESSMENT AND PLAN:  Discussed the following assessment and plan:  Yeast dermatitis  Anal fissure  -we discussed possible serious and likely etiologies, workup and treatment, treatment risks and return precautions -after this discussion, Carlotta opted for diflucan, healthy diet, probiotic, topical antifungal and steroid, aquaphor. Nitroglycerin top if not healing. -follow up advised in 1-2 weeks if not resolving, sooner if worsneing -advised assistant to review HM due   Patient Instructions  Wendie Simmer, please review/update health maintenance with her. Thanks!  Diflucan once daily for 3 days, then repeat on tablet in 1 week if needed.  Topical clotrimazole cream mixed with aquaphor 1-2 times daily for 3 weeks. These are available over the counter.  Topical Anusol for the tear for 1 week.  I hope you are feeling better soon! Follow up if your symptoms worsen, new concerns arise or you are not improving with treatment.     Lucretia Kern, DO

## 2017-12-12 ENCOUNTER — Other Ambulatory Visit: Payer: Medicare Other

## 2017-12-13 ENCOUNTER — Ambulatory Visit (INDEPENDENT_AMBULATORY_CARE_PROVIDER_SITE_OTHER)
Admission: RE | Admit: 2017-12-13 | Discharge: 2017-12-13 | Disposition: A | Discharge status: Home / Self Care | Payer: Medicare Other | Source: Ambulatory Visit

## 2017-12-13 DIAGNOSIS — E2839 Other primary ovarian failure: Secondary | ICD-10-CM | POA: Diagnosis not present

## 2018-01-03 ENCOUNTER — Ambulatory Visit: Payer: Medicare Other | Admitting: Family Medicine

## 2018-01-08 NOTE — Progress Notes (Signed)
[name] [place], [place] [address] Phone: [phone] Subjective:   Lindsay [name], am serving as a scribe for Dr. Hulan Saas.   CC: Neck pain follow-up  [id number]  Lindsay [name] is a 67 y.o. female coming in with complaint of neck and radiating pain into both arms. Walked on Saturday at [employer] and had tingling in all of her fingers. She feels that her symptoms are worsening.  Patient states that it seems to be a little bit more chronic than it has been in the past.  Patient also has not seen me for greater than 2 months.  Also fairly noncompliant with medications and home exercises     Past Medical History:  Diagnosis Date  . COMMON MIGRAINE 09/20/2006  . DEPRESSION 06/11/2009  . GERD 06/11/2009  . Neck pain    eval with sports medicine in 2016/17   Past Surgical History:  Procedure Laterality Date  . ABDOMINAL HYSTERECTOMY    . CHOLECYSTECTOMY    . OVARIAN CYST REMOVAL    . REPAIR EXTENSOR TENDON HAND    . TONSILLECTOMY     Social History   Socioeconomic History  . Marital status: Married    Spouse name: Not on file  . Number of children: Not on file  . Years of education: Not on file  . Highest education level: Not on file  Occupational History  . Not on file  Social Needs  . Financial resource strain: Not on file  . Food insecurity:    Worry: Not on file    Inability: Not on file  . Transportation needs:    Medical: Not on file    Non-medical: Not on file  Tobacco Use  . Smoking status: Former Smoker    Types: Cigarettes    Last attempt to quit: 04/04/2004    Years since quitting: 13.7  . Smokeless tobacco: Never Used  Substance and Sexual Activity  . Alcohol use: Yes    Alcohol/week: 0.0 standard drinks    Comment: occ  . Drug use: [name]  . Sexual activity: Not on file  Lifestyle  . Physical activity:    Days per week: Not on file    Minutes per session: Not on file  . Stress: Not on file   Relationships  . Social connections:    Talks on phone: Not on file    Gets together: Not on file    Attends religious service: Not on file    Active member of club or organization: Not on file    Attends meetings of clubs or organizations: Not on file    Relationship status: Not on file  Other Topics Concern  . Not on file  Social History Narrative  . Not on file   [name] Known Allergies Family History  Problem Relation Age of Onset  . Dementia Mother   . Osteoporosis Mother   . Breast cancer Sister   . Heart attack Brother   . Colon cancer Neg Hx   . Esophageal cancer Neg Hx   . Rectal cancer Neg Hx   . Stomach cancer Neg Hx        Current Outpatient Medications (Analgesics):  Marland Kitchen  SUMAtriptan (IMITREX) 100 MG tablet, TAKE 1/2-1 TABLET BY MOUTH AT ONSET MIGRAINE,MAY REPEAT ONCE 2HOURS IF NEED,MAX 2DOSES IN 24 HOURS   Current Outpatient Medications (Other):  Marland Kitchen  buPROPion (WELLBUTRIN SR) 150 MG 12 hr tablet, Take 1 tablet (150  mg total) by mouth 2 (two) times daily. .  Cholecalciferol (VITAMIN D3) 1000 UNITS capsule, Take 1,000 Units by mouth daily.   .  clindamycin (CLINDAGEL) 1 % gel, Apply topically 2 (two) times daily. .  diphenhydramine-acetaminophen (TYLENOL PM) 25-500 MG TABS, Take 1 tablet by mouth at bedtime as needed. .  Docusate Sodium (COLACE PO), Take by mouth. .  fish oil-omega-3 fatty acids 1000 MG capsule, Take 1 g by mouth daily. .  hydrocortisone (ANUSOL-HC) 2.5 % rectal cream, Place 1 application rectally 2 (two) times daily. Marland Kitchen  MAGNESIUM PO, Take by mouth. .  Misc Natural Products (TART CHERRY ADVANCED PO), Take by mouth daily. .  niacin 250 MG tablet, Take 250 mg by mouth daily with breakfast.   .  pantoprazole (PROTONIX) 40 MG tablet, Take 1 tablet (40 mg total) by mouth daily. .  traZODone (DESYREL) 50 MG tablet, Take 0.5-1 tablets (25-50 mg total) by mouth at bedtime as needed for sleep. .  Tretinoin, Facial Wrinkles, (TRETINOIN, EMOLLIENT,) 0.05 %  CREA, Apply a small amount to affected area nightly    Past medical history, social, surgical and family history all reviewed in electronic medical record.  [name] pertanent information unless stated regarding to the chief complaint.   Review of Systems:  [name] headache, visual changes, nausea, vomiting, diarrhea, constipation, dizziness, abdominal pain, skin rash, fevers, chills, night sweats, weight loss, swollen lymph nodes, body aches, joint swelling,  chest pain, shortness of breath, mood changes.  Positive muscle aches  Objective  Blood pressure (!) 142/88, pulse 69, height 5' 5.5" (1.664 m), weight 136 lb (61.7 kg), SpO2 93 %.   General: [name] apparent distress alert and oriented x3 mood and affect normal, dressed appropriately.  HEENT: Pupils equal, extraocular movements intact  Respiratory: Patient's speak in full sentences and does not appear short of breath  Cardiovascular: [name] lower extremity edema, non tender, [name] erythema  Skin: Warm dry intact with [name] signs of infection or rash on extremities or on axial skeleton.  Abdomen: Soft nontender  Neuro: Cranial nerves II through XII are intact, neurovascularly intact in all extremities with 2+ DTRs and 2+ pulses.  Lymph: [name] lymphadenopathy of posterior or anterior cervical chain or axillae bilaterally.  Gait normal with good balance and coordination.  MSK:  Non tender with full range of motion and good stability and symmetric strength and tone of shoulders, elbows, wrist, hip, knee and ankles bilaterally.  Neck exam shows loss of lordosis.  Loss of 5 to 10 degrees in all planes especially with extension.  Positive Spurling's on the right side.  4+ out of 5 strength in the C8 distribution but now bilaterally.  Deep tendon reflexes are still intact  Osteopathic findings C2 flexed rotated and side bent right T3 extended rotated and side bent right inhaled third rib T9 extended rotated and side bent left     Impression and Recommendations:       This case required medical decision making of moderate complexity. The above documentation has been reviewed and is accurate and complete Lindsay Pulley, DO       Note: This dictation was prepared with Dragon dictation along with smaller phrase technology. Any transcriptional errors that result from this process are unintentional.

## 2018-01-09 ENCOUNTER — Encounter: Payer: Self-pay | Admitting: Family Medicine

## 2018-01-09 ENCOUNTER — Ambulatory Visit (INDEPENDENT_AMBULATORY_CARE_PROVIDER_SITE_OTHER): Payer: Medicare Other | Admitting: Family Medicine

## 2018-01-09 VITALS — BP 142/88 | HR 69 | Ht 65.5 in | Wt 136.0 lb

## 2018-01-09 DIAGNOSIS — M999 Biomechanical lesion, unspecified: Secondary | ICD-10-CM | POA: Diagnosis not present

## 2018-01-09 DIAGNOSIS — Z23 Encounter for immunization: Secondary | ICD-10-CM | POA: Diagnosis not present

## 2018-01-09 DIAGNOSIS — M5030 Other cervical disc degeneration, unspecified cervical region: Secondary | ICD-10-CM

## 2018-01-09 NOTE — Patient Instructions (Signed)
ood to see you  See the price tag on the pennsaid  See me again in 4ish weeks

## 2018-01-09 NOTE — Assessment & Plan Note (Signed)
Degenerative cervical disc.  Discussed icing regimen and home exercises.  Discussed which activities to doing which wants to avoid.  Increase activity as tolerated.  Follow-up with me again in 4 to 8 weeks responds well to manipulation.  Encourage patient to take the medications on a regular basis patient declines formal physical therapy

## 2018-01-09 NOTE — Assessment & Plan Note (Signed)
Decision today to treat with OMT was based on Physical Exam  After verbal consent patient was treated with HVLA, ME, FPR techniques in cervical, thoracic, rib areas  Patient tolerated the procedure well with improvement in symptoms  Patient given exercises, stretches and lifestyle modifications  See medications in patient instructions if given  Patient will follow up in 4-6 weeks 

## 2018-02-05 NOTE — Progress Notes (Signed)
[name] [address] [place], [name] Phone: [phone] Subjective:    I Lindsay [name] am serving as a Education administrator for Dr. Hulan Saas.   CC: Back pain follow-up  [id number]  Lindsay [name] is a 67 y.o. female coming in with complaint of back pain. States that she has had some issues the last few weeks. Neck is stiff and has been popping.  Has had back pain and neck pain.  Noticing more radicular symptoms in the hands.  Denies any numbness tingling.      Past Medical History:  Diagnosis Date  . COMMON MIGRAINE 09/20/2006  . DEPRESSION 06/11/2009  . GERD 06/11/2009  . Neck pain    eval with sports medicine in 2016/17   Past Surgical History:  Procedure Laterality Date  . ABDOMINAL HYSTERECTOMY    . CHOLECYSTECTOMY    . OVARIAN CYST REMOVAL    . REPAIR EXTENSOR TENDON HAND    . TONSILLECTOMY     Social History   Socioeconomic History  . Marital status: Married    Spouse name: Not on file  . Number of children: Not on file  . Years of education: Not on file  . Highest education level: Not on file  Occupational History  . Not on file  Social Needs  . Financial resource strain: Not on file  . Food insecurity:    Worry: Not on file    Inability: Not on file  . Transportation needs:    Medical: Not on file    Non-medical: Not on file  Tobacco Use  . Smoking status: Former Smoker    Types: Cigarettes    Last attempt to quit: 04/04/2004    Years since quitting: 13.8  . Smokeless tobacco: Never Used  Substance and Sexual Activity  . Alcohol use: Yes    Alcohol/week: 0.0 standard drinks    Comment: occ  . Drug use: No  . Sexual activity: Not on file  Lifestyle  . Physical activity:    Days per week: Not on file    Minutes per session: Not on file  . Stress: Not on file  Relationships  . Social connections:    Talks on phone: Not on file    Gets together: Not on file    Attends religious service: Not on file    Active  member of club or organization: Not on file    Attends meetings of clubs or organizations: Not on file    Relationship status: Not on file  Other Topics Concern  . Not on file  Social History Narrative  . Not on file   No Known Allergies Family History  Problem Relation Age of Onset  . Dementia Mother   . Osteoporosis Mother   . Breast cancer Sister   . Heart attack Brother   . Colon cancer Neg Hx   . Esophageal cancer Neg Hx   . Rectal cancer Neg Hx   . Stomach cancer Neg Hx        Current Outpatient Medications (Analgesics):  Marland Kitchen  SUMAtriptan (IMITREX) 100 MG tablet, TAKE 1/2-1 TABLET BY MOUTH AT ONSET MIGRAINE,MAY REPEAT ONCE 2HOURS IF NEED,MAX 2DOSES IN 24 HOURS   Current Outpatient Medications (Other):  Marland Kitchen  buPROPion (WELLBUTRIN SR) 150 MG 12 hr tablet, Take 1 tablet (150 mg total) by mouth 2 (two) times daily. .  Cholecalciferol (VITAMIN D3) 1000 UNITS capsule, Take 1,000 Units by mouth daily.   .  clindamycin (  CLINDAGEL) 1 % gel, Apply topically 2 (two) times daily. .  diphenhydramine-acetaminophen (TYLENOL PM) 25-500 MG TABS, Take 1 tablet by mouth at bedtime as needed. .  Docusate Sodium (COLACE PO), Take by mouth. .  fish oil-omega-3 fatty acids 1000 MG capsule, Take 1 g by mouth daily. .  hydrocortisone (ANUSOL-HC) 2.5 % rectal cream, Place 1 application rectally 2 (two) times daily. Marland Kitchen  MAGNESIUM PO, Take by mouth. .  Misc Natural Products (TART CHERRY ADVANCED PO), Take by mouth daily. .  niacin 250 MG tablet, Take 250 mg by mouth daily with breakfast.   .  pantoprazole (PROTONIX) 40 MG tablet, Take 1 tablet (40 mg total) by mouth daily. .  traZODone (DESYREL) 50 MG tablet, Take 0.5-1 tablets (25-50 mg total) by mouth at bedtime as needed for sleep. .  Tretinoin, Facial Wrinkles, (TRETINOIN, EMOLLIENT,) 0.05 % CREA, Apply a small amount to affected area nightly    Past medical history, social, surgical and family history all reviewed in electronic medical record.   No pertanent information unless stated regarding to the chief complaint.   Review of Systems:  No headache, visual changes, nausea, vomiting, diarrhea, constipation, dizziness, abdominal pain, skin rash, fevers, chills, night sweats, weight loss, swollen lymph nodes, body aches, joint swelling,  chest pain, shortness of breath, mood changes.  Positive muscle aches  Objective  Blood pressure 110/72, pulse 62, height 5' 5.5" (1.664 m), weight 135 lb (61.2 kg), SpO2 98 %.    General: No apparent distress alert and oriented x3 mood and affect normal, dressed appropriately.  HEENT: Pupils equal, extraocular movements intact  Respiratory: Patient's speak in full sentences and does not appear short of breath  Cardiovascular: No lower extremity edema, non tender, no erythema  Skin: Warm dry intact with no signs of infection or rash on extremities or on axial skeleton.  Abdomen: Soft nontender  Neuro: Cranial nerves II through XII are intact, neurovascularly intact in all extremities with 2+ DTRs and 2+ pulses.  Lymph: No lymphadenopathy of posterior or anterior cervical chain or axillae bilaterally.  Gait normal with good balance and coordination.  MSK:  Non tender with full range of motion and good stability and symmetric strength and tone of shoulders, elbows, wrist, hip, knee and ankles bilaterally.  Mild arthritic changes of multiple joints especially the hands  Back exam has loss of lordosis.  Loss of all planes especially with extension of 10 degrees.  Mild positive Spurling's on the right side.  Seems to be in the C7 distribution.  Mild weakness in grip strength bilaterally.  Tightness of the trapezius bilaterally  Osteopathic findings C2 flexed rotated and side bent right T3 extended rotated and side bent right inhaled third rib T5 extended rotated and side bent left      Impression and Recommendations:     This case required medical decision making of moderate complexity. The  above documentation has been reviewed and is accurate and complete [name], DO       Note: This dictation was prepared with Dragon dictation along with smaller phrase technology. Any transcriptional errors that result from this process are unintentional.

## 2018-02-06 ENCOUNTER — Encounter: Payer: Self-pay | Admitting: Family Medicine

## 2018-02-06 ENCOUNTER — Ambulatory Visit (INDEPENDENT_AMBULATORY_CARE_PROVIDER_SITE_OTHER): Payer: Medicare Other | Admitting: Family Medicine

## 2018-02-06 VITALS — BP 110/72 | HR 62 | Ht 65.5 in | Wt 135.0 lb

## 2018-02-06 DIAGNOSIS — M999 Biomechanical lesion, unspecified: Secondary | ICD-10-CM | POA: Diagnosis not present

## 2018-02-06 DIAGNOSIS — M5030 Other cervical disc degeneration, unspecified cervical region: Secondary | ICD-10-CM

## 2018-02-06 NOTE — Assessment & Plan Note (Signed)
Decision today to treat with OMT was based on Physical Exam  After verbal consent patient was treated with HVLA, ME, FPR techniques in cervical, thoracic, rib areas  Patient tolerated the procedure well with improvement in symptoms  Patient given exercises, stretches and lifestyle modifications  See medications in patient instructions if given  Patient will follow up in 4-8 weeks 

## 2018-02-06 NOTE — Assessment & Plan Note (Signed)
Moderate to severe arthritis.  Has been doing fairly well with conservative therapy.  Continue with the manipulation.  Discussed icing regimen.  Discussed home exercises and posture and ergonomics.  Encourage patient to take the gabapentin which she has been fairly noncompliant.  We discussed the possibility of other medications which patient declined.  Patient does not want another epidural in the neck.  Follow-up again in 4 to 8 weeks

## 2018-02-06 NOTE — Patient Instructions (Signed)
Good to see you See me again in 4-6 weeks 

## 2018-03-12 NOTE — Progress Notes (Signed)
[name] [place], [place] [address] Phone: [phone] Subjective:   Lindsay [name], am serving as a scribe for Dr. Hulan Saas.   CC: Back pain follow-up  [id number]  Lindsay [name] is a 67 y.o. female coming in with complaint of back pain. She is here for OMT that has been used to successfully managed her back pain. [name] changes in pain since last visit other than a recently feeling of tightness.  Patient has had back and neck pain.  Mild radiation down the arm still of the neck.  Patient continues to do relatively well with nothing that stops her from activity but sometimes the discomfort can be is quite severe.  Continuing same medications      Past Medical History:  Diagnosis Date  . COMMON MIGRAINE 09/20/2006  . DEPRESSION 06/11/2009  . GERD 06/11/2009  . Neck pain    eval with sports medicine in 2016/17   Past Surgical History:  Procedure Laterality Date  . ABDOMINAL HYSTERECTOMY    . CHOLECYSTECTOMY    . OVARIAN CYST REMOVAL    . REPAIR EXTENSOR TENDON HAND    . TONSILLECTOMY     Social History   Socioeconomic History  . Marital status: Married    Spouse name: Not on file  . Number of children: Not on file  . Years of education: Not on file  . Highest education level: Not on file  Occupational History  . Not on file  Social Needs  . Financial resource strain: Not on file  . Food insecurity:    Worry: Not on file    Inability: Not on file  . Transportation needs:    Medical: Not on file    Non-medical: Not on file  Tobacco Use  . Smoking status: Former Smoker    Types: Cigarettes    Last attempt to quit: 04/04/2004    Years since quitting: 13.9  . Smokeless tobacco: Never Used  Substance and Sexual Activity  . Alcohol use: Yes    Alcohol/week: 0.0 standard drinks    Comment: occ  . Drug use: [name]  . Sexual activity: Not on file  Lifestyle  . Physical activity:    Days per week: Not on file    Minutes per  session: Not on file  . Stress: Not on file  Relationships  . Social connections:    Talks on phone: Not on file    Gets together: Not on file    Attends religious service: Not on file    Active member of club or organization: Not on file    Attends meetings of clubs or organizations: Not on file    Relationship status: Not on file  Other Topics Concern  . Not on file  Social History Narrative  . Not on file   [name] Known Allergies Family History  Problem Relation Age of Onset  . Dementia Mother   . Osteoporosis Mother   . Breast cancer Sister   . Heart attack Brother   . Colon cancer Neg Hx   . Esophageal cancer Neg Hx   . Rectal cancer Neg Hx   . Stomach cancer Neg Hx        Current Outpatient Medications (Analgesics):  Marland Kitchen  SUMAtriptan (IMITREX) 100 MG tablet, TAKE 1/2-1 TABLET BY MOUTH AT ONSET MIGRAINE,MAY REPEAT ONCE 2HOURS IF NEED,MAX 2DOSES IN 24 HOURS   Current Outpatient Medications (Other):  Marland Kitchen  buPROPion (WELLBUTRIN SR) 150 MG  12 hr tablet, Take 1 tablet (150 mg total) by mouth 2 (two) times daily. .  Cholecalciferol (VITAMIN D3) 1000 UNITS capsule, Take 1,000 Units by mouth daily.   .  clindamycin (CLINDAGEL) 1 % gel, Apply topically 2 (two) times daily. .  diphenhydramine-acetaminophen (TYLENOL PM) 25-500 MG TABS, Take 1 tablet by mouth at bedtime as needed. .  Docusate Sodium (COLACE PO), Take by mouth. .  fish oil-omega-3 fatty acids 1000 MG capsule, Take 1 g by mouth daily. .  hydrocortisone (ANUSOL-HC) 2.5 % rectal cream, Place 1 application rectally 2 (two) times daily. Marland Kitchen  MAGNESIUM PO, Take by mouth. .  Misc Natural Products (TART CHERRY ADVANCED PO), Take by mouth daily. .  niacin 250 MG tablet, Take 250 mg by mouth daily with breakfast.   .  pantoprazole (PROTONIX) 40 MG tablet, Take 1 tablet (40 mg total) by mouth daily. .  traZODone (DESYREL) 50 MG tablet, Take 0.5-1 tablets (25-50 mg total) by mouth at bedtime as needed for sleep. .  Tretinoin,  Facial Wrinkles, (TRETINOIN, EMOLLIENT,) 0.05 % CREA, Apply a small amount to affected area nightly    Past medical history, social, surgical and family history all reviewed in electronic medical record.  [name] pertanent information unless stated regarding to the chief complaint.   Review of Systems:  [name] headache, visual changes, nausea, vomiting, diarrhea, constipation, dizziness, abdominal pain, skin rash, fevers, chills, night sweats, weight loss, swollen lymph nodes, body aches, joint swelling, m chest pain, shortness of breath, mood changes.  Positive muscle aches  Objective  Blood pressure 110/84, pulse 83, height 5' 5.5" (1.664 m), weight 134 lb (60.8 kg), SpO2 98 %.   General: [name] apparent distress alert and oriented x3 mood and affect normal, dressed appropriately.  HEENT: Pupils equal, extraocular movements intact  Respiratory: Patient's speak in full sentences and does not appear short of breath  Cardiovascular: [name] lower extremity edema, non tender, [name] erythema  Skin: Warm dry intact with [name] signs of infection or rash on extremities or on axial skeleton.  Abdomen: Soft nontender  Neuro: Cranial nerves II through XII are intact, neurovascularly intact in all extremities with 2+ DTRs and 2+ pulses.  Lymph: [name] lymphadenopathy of posterior or anterior cervical chain or axillae bilaterally.  Gait normal with good balance and coordination.  MSK:  Non tender with full range of motion and good stability and symmetric strength and tone of shoulders, elbows, wrist, hip, knee and ankles bilaterally.  [name] gross deformity, swelling, bruising. TTP .  [name] midline/bony TTP. FROM neck - pain . BUE strength 5/5.   Sensation intact to light touch.   2+ equal reflexes in triceps, biceps, brachioradialis tendons. Negative spurlings. NV intact distal BUEs. Tightness is noted though in the paraspinal musculature  Osteopathic findings C2 flexed rotated and side bent right C4 flexed rotated and side  bent left C6 flexed rotated and side bent left T3 extended rotated and side bent right inhaled third rib      Impression and Recommendations:     This case required medical decision making of moderate complexity. The above documentation has been reviewed and is accurate and complete [name], DO       Note: This dictation was prepared with Dragon dictation along with smaller phrase technology. Any transcriptional errors that result from this process are unintentional.

## 2018-03-13 ENCOUNTER — Ambulatory Visit (INDEPENDENT_AMBULATORY_CARE_PROVIDER_SITE_OTHER): Payer: Medicare Other | Admitting: Family Medicine

## 2018-03-13 ENCOUNTER — Encounter: Payer: Self-pay | Admitting: Family Medicine

## 2018-03-13 VITALS — BP 110/84 | HR 83 | Ht 65.5 in | Wt 134.0 lb

## 2018-03-13 DIAGNOSIS — M5030 Other cervical disc degeneration, unspecified cervical region: Secondary | ICD-10-CM | POA: Diagnosis not present

## 2018-03-13 DIAGNOSIS — M999 Biomechanical lesion, unspecified: Secondary | ICD-10-CM

## 2018-03-13 NOTE — Assessment & Plan Note (Signed)
Known degenerative disc disease.  Discussed gabapentin.  Patient is to do the home exercises icing regimen, which activities to do which wants to avoid.  Patient will increase activity as tolerated.

## 2018-03-13 NOTE — Assessment & Plan Note (Signed)
Decision today to treat with OMT was based on Physical Exam  After verbal consent patient was treated with HVLA techniques in Cervical, thoracic, and rib areas  Patient tolerated the procedure well with improvement in symptoms  Patient given exercises, stretches and lifestyle modifications  See medications in patient instructions if given  Patient will follow up in 4-6 weeks

## 2018-03-13 NOTE — Patient Instructions (Signed)
Good to see you  You are the best  Happy holidays!  See me again in 4-8 weeks

## 2018-03-18 ENCOUNTER — Other Ambulatory Visit: Payer: Self-pay | Admitting: Family Medicine

## 2018-04-16 ENCOUNTER — Encounter: Payer: Self-pay | Admitting: Family Medicine

## 2018-04-16 DIAGNOSIS — H3561 Retinal hemorrhage, right eye: Secondary | ICD-10-CM | POA: Diagnosis not present

## 2018-04-16 DIAGNOSIS — H43812 Vitreous degeneration, left eye: Secondary | ICD-10-CM | POA: Diagnosis not present

## 2018-04-17 ENCOUNTER — Ambulatory Visit (INDEPENDENT_AMBULATORY_CARE_PROVIDER_SITE_OTHER): Payer: Medicare Other | Admitting: Family Medicine

## 2018-04-17 ENCOUNTER — Encounter: Payer: Self-pay | Admitting: Family Medicine

## 2018-04-17 VITALS — BP 138/90 | HR 74 | Temp 98.3°F | Ht 65.0 in | Wt 134.6 lb

## 2018-04-17 DIAGNOSIS — Z0000 Encounter for general adult medical examination without abnormal findings: Secondary | ICD-10-CM | POA: Diagnosis not present

## 2018-04-17 DIAGNOSIS — M8580 Other specified disorders of bone density and structure, unspecified site: Secondary | ICD-10-CM | POA: Diagnosis not present

## 2018-04-17 DIAGNOSIS — F3340 Major depressive disorder, recurrent, in remission, unspecified: Secondary | ICD-10-CM

## 2018-04-17 DIAGNOSIS — M810 Age-related osteoporosis without current pathological fracture: Secondary | ICD-10-CM | POA: Insufficient documentation

## 2018-04-17 DIAGNOSIS — I10 Essential (primary) hypertension: Secondary | ICD-10-CM | POA: Diagnosis not present

## 2018-04-17 HISTORY — DX: Essential (primary) hypertension: I10

## 2018-04-17 HISTORY — DX: Other specified disorders of bone density and structure, unspecified site: M85.80

## 2018-04-17 MED ORDER — AMLODIPINE BESYLATE 2.5 MG PO TABS: 2.5000 mg | ORAL_TABLET | Freq: Every day | ORAL | 0 refills | Status: DC

## 2018-04-17 MED ORDER — BUPROPION HCL ER (SR) 150 MG PO TB12: 150.0000 mg | ORAL_TABLET | Freq: Two times a day (BID) | ORAL | 1 refills | Status: DC

## 2018-04-17 NOTE — Patient Instructions (Addendum)
BEFORE YOU LEAVE: -lab -? shinrix at pharmacy if wishes to do or check insurance -follow up: 1-2 months, come fasting - for BP and  Skin check  Start norvasc 2.5 mg every day in the morning for blood pressure.   Lindsay [name] , Thank you for taking time to come for your Medicare Wellness Visit. I appreciate your ongoing commitment to your health goals. Please review the following plan we discussed and let me know if I can assist you in the future.   These are the goals we discussed: Goals   Healthy low sugar diet - I recommend the Mediterranean diet Regular aerobic exercise - at least 150-300 minutes per week Vit D3 [phone] IU daily Get help for stress - consider counseling if able Consider shinrix vaccine for shingles if not done Advanced directives     This is a list of the screening recommended for you and due dates:  Health Maintenance  Topic Date Due  . HIV Screening  11/02/2024*  . Mammogram  04/18/2019  . Colon Cancer Screening  08/13/2024  . Tetanus Vaccine  08/16/2027  . Flu Shot  Completed  . DEXA scan (bone density measurement)  Completed  .  Hepatitis C: One time screening is recommended by Center for Disease Control  (CDC) for  adults born from [birth year] through 1963.   Completed  . Pneumonia vaccines  Completed  *Topic was postponed. The date shown is not the original due date.    We recommend the following healthy lifestyle for LIFE: 1) Small portions. But, make sure to get regular (at least 3 per day), healthy meals and small healthy snacks if needed.  2) Eat a healthy clean diet.   TRY TO EAT: -at least 5-7 servings of low sugar, colorful, and nutrient rich vegetables per day (not corn, potatoes or bananas.) -berries are the best choice if you wish to eat fruit (only eat small amounts if trying to reduce weight)  -lean meets (fish, white meat of chicken or [place]) -vegan proteins for some meals - beans or tofu, whole grains, nuts and seeds -Replace bad fats with  good fats - good fats include: fish, nuts and seeds, canola oil, olive oil -small amounts of low fat or non fat dairy -small amounts of100 % whole grains - check the lables -drink plenty of water  AVOID: -SUGAR, sweets, anything with added sugar, corn syrup or sweeteners - must read labels as even foods advertised as "healthy" often are loaded with sugar -if you must have a sweetener, small amounts of stevia may be best -sweetened beverages and artificially sweetened beverages -simple starches (rice, bread, potatoes, pasta, chips, etc - small amounts of 100% whole grains are ok) -red meat, pork, butter -fried foods, fast food, processed food, excessive dairy, eggs and coconut.  3)Get at least 150 minutes of sweaty aerobic exercise per week.  4)Reduce stress - consider counseling, meditation and relaxation to balance other aspects of your life.

## 2018-04-17 NOTE — Addendum Note (Signed)
Addended by: Agnes Lawrence on: 04/17/2018 04:36 PM   Modules accepted: Orders

## 2018-04-17 NOTE — Progress Notes (Signed)
Medicare Annual Preventive Care Visit  (initial annual wellness or annual wellness exam)  Concerns and/or follow up today:  Lindsay [name] is a pleasant 68 y.o. here for follow up. Chronic medical problems summarized below were reviewed for changes and stability and were updated as needed below. These issues and their treatment remain stable for the most part.  Labs were done in May 2019 and were stable. Denies CP, syncope, SOB, DOE, treatment intolerance or new symptoms. Lots of stress recently. Saw opthalmology for some floaters recently, Dr. Peter Garter. Status post hysterectomy for fibroids per her report. Had a colonoscopy in 08/2014, 10-year repeat advised Last mammogram was 04/2017, 3D mammo-BI-RADS 1 DEXA done 12/2017 w/ osteopenia I do not provide opioids for this patient.  Elevated BP: -she preferred to monitor, declined treatment in the past and initially declined t today as well -she felt is stress - better since work ended (company [employer])  GERD: -uses PPI prn -rare dysphagia, refused GI evaluation in the past,  -hx dilation of esophagus remotely -stable  Migraines: -Uses prn triptan, magnesium  Depression and stress: Chronic depression, treated with Wellbutrin; sport med rxd trazadone as well Reports moodok overall - just routine stress, denies anxiety or depression -requests refill on wellbutrin See hpi  Mild hyperlipidemia: -walking some -Reports a healthy diet -prefers to take niacin and fish oil rather then medication for this  Chronic neck pain: -sees sports medicine for managment   See HM section in Epic for other details of completed HM. See scanned documentation under Media Tab for further documentation HPI, health risk assessment. See Media Tab and Care Teams sections in Epic for other providers.  ROS: negative for report of fevers, unintentional weight loss, vision changes, vision loss, hearing loss or change, chest pain, sob, hemoptysis, melena,  hematochezia, hematuria, genital discharge or lesions, falls, bleeding or bruising, loc, thoughts of suicide or self harm, memory loss  1.) Patient-completed health risk assessment  - completed and reviewed, see scanned documentation  2.) Review of Medical History: -PMH, PSH, Family History and current specialty and care providers reviewed and updated and listed below  - see scanned in document in chart and below  Past Medical History:  Diagnosis Date  . COMMON MIGRAINE 09/20/2006  . DEPRESSION 06/11/2009  . GERD 06/11/2009  . Hypertension 04/17/2018  . Neck pain    eval with sports medicine in 2016/17  . Osteopenia 04/17/2018    Past Surgical History:  Procedure Laterality Date  . ABDOMINAL HYSTERECTOMY    . CHOLECYSTECTOMY    . OVARIAN CYST REMOVAL    . REPAIR EXTENSOR TENDON HAND    . TONSILLECTOMY      Social History   Socioeconomic History  . Marital status: Married    Spouse name: Not on file  . Number of children: Not on file  . Years of education: Not on file  . Highest education level: Not on file  Occupational History  . Not on file  Social Needs  . Financial resource strain: Not on file  . Food insecurity:    Worry: Not on file    Inability: Not on file  . Transportation needs:    Medical: Not on file    Non-medical: Not on file  Tobacco Use  . Smoking status: Former Smoker    Types: Cigarettes    Last attempt to quit: 04/04/2004    Years since quitting: 14.0  . Smokeless tobacco: Never Used  Substance and Sexual Activity  . Alcohol use:  Yes    Alcohol/week: 0.0 standard drinks    Comment: occ  . Drug use: No  . Sexual activity: Not on file  Lifestyle  . Physical activity:    Days per week: Not on file    Minutes per session: Not on file  . Stress: Not on file  Relationships  . Social connections:    Talks on phone: Not on file    Gets together: Not on file    Attends religious service: Not on file    Active member of club or organization: Not  on file    Attends meetings of clubs or organizations: Not on file    Relationship status: Not on file  . Intimate partner violence:    Fear of current or ex partner: Not on file    Emotionally abused: Not on file    Physically abused: Not on file    Forced sexual activity: Not on file  Other Topics Concern  . Not on file  Social History Narrative  . Not on file    Family History  Problem Relation Age of Onset  . Dementia Mother   . Osteoporosis Mother   . Breast cancer Sister   . Heart attack Brother   . Colon cancer Neg Hx   . Esophageal cancer Neg Hx   . Rectal cancer Neg Hx   . Stomach cancer Neg Hx     Current Outpatient Medications on File Prior to Visit  Medication Sig Dispense Refill  . Cholecalciferol (VITAMIN D3) 1000 UNITS capsule Take 1,000 Units by mouth daily.      . clindamycin (CLINDAGEL) 1 % gel Apply topically 2 (two) times daily. 30 g 3  . diphenhydramine-acetaminophen (TYLENOL PM) 25-500 MG TABS Take 1 tablet by mouth at bedtime as needed.    Mariane Baumgarten Sodium (COLACE PO) Take by mouth.    . fish oil-omega-3 fatty acids 1000 MG capsule Take 1 g by mouth daily.    . hydrocortisone (ANUSOL-HC) 2.5 % rectal cream Place 1 application rectally 2 (two) times daily. 30 g 0  . MAGNESIUM PO Take by mouth.    . Misc Natural Products (TART CHERRY ADVANCED PO) Take by mouth daily.    . niacin 250 MG tablet Take 250 mg by mouth daily with breakfast.      . pantoprazole (PROTONIX) 40 MG tablet Take 1 tablet (40 mg total) by mouth daily. 30 tablet 3  . SUMAtriptan (IMITREX) 100 MG tablet TAKE 1/2-1 TABLET BY MOUTH AT ONSET MIGRAINE,MAY REPEAT ONCE 2HOURS IF NEED,MAX 2DOSES IN 24 HOURS 9 tablet 3  . traZODone (DESYREL) 50 MG tablet Take 0.5-1 tablets (25-50 mg total) by mouth at bedtime as needed for sleep. 30 tablet 3  . Tretinoin, Facial Wrinkles, (TRETINOIN, EMOLLIENT,) 0.05 % CREA Apply a small amount to affected area nightly 20 g 1   No current facility-administered  medications on file prior to visit.      3.) Review of functional ability and level of safety:  Any difficulty hearing?  See scanned documentation  History of falling?  See scanned documentation  Any trouble with IADLs - using a phone, using transportation, grocery shopping, preparing meals, doing housework, doing laundry, taking medications and managing money?  See scanned documentation  Advance Directives?  Discussed briefly and offered more resources. She plans to talk with family and possibly elder law.  See summary of recommendations in Patient Instructions below.  4.) Physical Exam Vitals:   04/17/18 1449  BP:  138/90  Pulse: 74  Temp: 98.3 F (36.8 C)   Estimated body mass index is 22.4 kg/m as calculated from the following:   Height as of this encounter: 5\' 5"  (1.651 m).   Weight as of this encounter: 134 lb 9.6 oz (61.1 kg).  EKG (optional): deferred  General: alert, appear well hydrated and in no acute distress  HEENT: visual acuity grossly intact, she refused vision screening exam as saw her opthomologist very recently for thorough exam.   CV: HRRR  Lungs: CTA bilaterally  Psych: pleasant and cooperative, no obvious depression or anxiety  Cognitive function grossly intact, speech and thought processing grossly intact, oriented x3, writing normal, CN II-XII grossly intact, finger to nose normal  See patient instructions for recommendations.  Education and counseling regarding the above review of health provided with a plan for the following: -see scanned patient completed form for further details -fall prevention strategies discussed  -healthy lifestyle discussed -importance and resources for completing advanced directives discussed -see patient instructions below for any other recommendations provided  4)The following written screening schedule of preventive measures were reviewed with assessment and plan made per below, orders and patient  instructions:      AAA screening done if applicable     Alcohol screening done     Obesity Screening and counseling done     STI screening (Hep C if born 1943-65) offered and per pt wishes     Tobacco Screening done        Pneumococcal (PPSV23 -one dose after 64, one before if risk factors), influenza yearly and hepatitis B vaccines (if high risk - end stage renal disease, IV drugs, homosexual men, live in home for mentally retarded, hemophilia receiving factors) ASSESSMENT/PLAN: done at pharmacy per her report      Screening mammograph (yearly if >40) ASSESSMENT/PLAN: utd or ordered      Screening Pap smear/pelvic exam (q2 years) ASSESSMENT/PLAN: n/a, declined      Prostate cancer screening ASSESSMENT/PLAN: n/a, declined      Colorectal cancer screening (FOBT yearly or flex sig q4y or colonoscopy q10y or barium enema q4y) ASSESSMENT/PLAN: utd or ordered      Diabetes outpatient self-management training services ASSESSMENT/PLAN: utd or done      Bone mass measurements(covered q2y if indicated - estrogen def, osteoporosis, hyperparathyroid, vertebral abnormalities, osteoporosis or steroids) ASSESSMENT/PLAN: utd or discussed and ordered per pt wishes      Screening for glaucoma(q1y if high risk - diabetes, FH, AA and > 50 or hispanic and > 65) ASSESSMENT/PLAN: utd or advised      Medical nutritional therapy for individuals with diabetes or renal disease ASSESSMENT/PLAN: see orders      Cardiovascular screening blood tests (lipids q5y) ASSESSMENT/PLAN: see orders and labs      Diabetes screening tests ASSESSMENT/PLAN: see orders and labs   7.) Summary:   Medicare annual wellness visit, initial -risk factors and conditions per above assessment were discussed and treatment, recommendations and referrals were offered per documentation above and orders and patient instructions.  Essential hypertension - Plan: Basic metabolic panel -discussed treatment options, risks,  implications at length -she finally agreed to start low dose norvasc 2.5 mg and work on diet -advised close follow up in 1-2 months  Osteopenia, unspecified location -discussed vit D, weight bearing exercise, calcium from diet, recheck 2 years  Recurrent Depression in remission -reports mood stable and denies dep or anxiety -refills for wellbutrin provided  Patient Instructions   BEFORE YOU LEAVE: -lab -?  shinrix at pharmacy if wishes to do or check insurance -follow up: 1-2 months, come fasting - for BP and  Skin check  Start norvasc 2.5 mg every day in the morning for blood pressure.   Ms. Dress , Thank you for taking time to come for your Medicare Wellness Visit. I appreciate your ongoing commitment to your health goals. Please review the following plan we discussed and let me know if I can assist you in the future.   These are the goals we discussed: Goals   Healthy low sugar diet - I recommend the Mediterranean diet Regular aerobic exercise - at least 150-300 minutes per week Vit D3 [phone] IU daily Get help for stress - consider counseling if able Consider shinrix vaccine for shingles if not done Advanced directives     This is a list of the screening recommended for you and due dates:  Health Maintenance  Topic Date Due  . HIV Screening  11/02/2024*  . Mammogram  04/18/2019  . Colon Cancer Screening  08/13/2024  . Tetanus Vaccine  08/16/2027  . Flu Shot  Completed  . DEXA scan (bone density measurement)  Completed  .  Hepatitis C: One time screening is recommended by Center for Disease Control  (CDC) for  adults born from [birth year] through 1963.   Completed  . Pneumonia vaccines  Completed  *Topic was postponed. The date shown is not the original due date.    We recommend the following healthy lifestyle for LIFE: 1) Small portions. But, make sure to get regular (at least 3 per day), healthy meals and small healthy snacks if needed.  2) Eat a healthy clean  diet.   TRY TO EAT: -at least 5-7 servings of low sugar, colorful, and nutrient rich vegetables per day (not corn, potatoes or bananas.) -berries are the best choice if you wish to eat fruit (only eat small amounts if trying to reduce weight)  -lean meets (fish, white meat of chicken or [place]) -vegan proteins for some meals - beans or tofu, whole grains, nuts and seeds -Replace bad fats with good fats - good fats include: fish, nuts and seeds, canola oil, olive oil -small amounts of low fat or non fat dairy -small amounts of100 % whole grains - check the lables -drink plenty of water  AVOID: -SUGAR, sweets, anything with added sugar, corn syrup or sweeteners - must read labels as even foods advertised as "healthy" often are loaded with sugar -if you must have a sweetener, small amounts of stevia may be best -sweetened beverages and artificially sweetened beverages -simple starches (rice, bread, potatoes, pasta, chips, etc - small amounts of 100% whole grains are ok) -red meat, pork, butter -fried foods, fast food, processed food, excessive dairy, eggs and coconut.  3)Get at least 150 minutes of sweaty aerobic exercise per week.  4)Reduce stress - consider counseling, meditation and relaxation to balance other aspects of your life.    Lucretia Kern, DO

## 2018-04-18 ENCOUNTER — Encounter: Payer: Self-pay | Admitting: Family Medicine

## 2018-04-18 LAB — BASIC METABOLIC PANEL
BUN: 16 mg/dL (ref 6–23)
CO2: 28 meq/L (ref 19–32)
Calcium: 9.8 mg/dL (ref 8.4–10.5)
Chloride: 104 mEq/L (ref 96–112)
Creatinine, Ser: 1.01 mg/dL (ref 0.40–1.20)
GFR: 58.38 mL/min — AB (ref >60.00)
GLUCOSE: 98 mg/dL (ref 70–99)
POTASSIUM: 4.2 meq/L (ref 3.5–5.1)
SODIUM: 139 meq/L (ref 135–145)

## 2018-04-19 NOTE — Telephone Encounter (Signed)
I left a message for the pt to return my call.  CRM also created. 

## 2018-04-19 NOTE — Telephone Encounter (Signed)
Patient called with questions regarding her GFR levels.  I explained results to her per the lab results note Pt is concerned with the overall drop in the last 6 months. Pt states that she is aware that her levels are just slightly low right now at 58 bit she is looking at her levels from 6 months ago where the GFR was 72. Pt is concerned with this drastic drop of 14 -- anything to be concerned of?   Pt requests a call back to discuss.   Please advise. Thanks.

## 2018-04-19 NOTE — Telephone Encounter (Signed)
Patient returning call to Wendie Simmer. Currently unavailable. Please advise.

## 2018-04-19 NOTE — Telephone Encounter (Signed)
I left a message for the pt to return my call. 

## 2018-04-23 NOTE — Telephone Encounter (Signed)
Patient called back and was informed of the message below per Dr Maudie Mercury.

## 2018-04-23 NOTE — Progress Notes (Signed)
[name] [address] [place], [place] [address] Phone: [phone] Subjective:   Lindsay [name], am serving as a scribe for Dr. Hulan Saas.  CC: Neck pain follow-up  FTD:[id number]  Lindsay [name] is a 68 y.o. female coming in with complaint of neck spine pain. Complains of tightness. She has been under some stress as she was told that she has bleeding in her right eye after she went into optometrist for floaters in the left eye. She also has been placed on blood pressure medication recently and is feeling stressed over that addition. Patient also feels that the cold weather is contributing to her tightness.       Past Medical History:  Diagnosis Date  . COMMON MIGRAINE 09/20/2006  . DEPRESSION 06/11/2009  . GERD 06/11/2009  . Hypertension 04/17/2018  . Neck pain    eval with sports medicine in 2016/17  . Osteopenia 04/17/2018   Past Surgical History:  Procedure Laterality Date  . ABDOMINAL HYSTERECTOMY    . CHOLECYSTECTOMY    . OVARIAN CYST REMOVAL    . REPAIR EXTENSOR TENDON HAND    . TONSILLECTOMY     Social History   Socioeconomic History  . Marital status: Married    Spouse name: Not on file  . Number of children: Not on file  . Years of education: Not on file  . Highest education level: Not on file  Occupational History  . Not on file  Social Needs  . Financial resource strain: Not on file  . Food insecurity:    Worry: Not on file    Inability: Not on file  . Transportation needs:    Medical: Not on file    Non-medical: Not on file  Tobacco Use  . Smoking status: Former Smoker    Types: Cigarettes    Last attempt to quit: 04/04/2004    Years since quitting: 14.0  . Smokeless tobacco: Never Used  Substance and Sexual Activity  . Alcohol use: Yes    Alcohol/week: 0.0 standard drinks    Comment: occ  . Drug use: [name]  . Sexual activity: Not on file  Lifestyle  . Physical activity:    Days per week: Not on file    Minutes  per session: Not on file  . Stress: Not on file  Relationships  . Social connections:    Talks on phone: Not on file    Gets together: Not on file    Attends religious service: Not on file    Active member of club or organization: Not on file    Attends meetings of clubs or organizations: Not on file    Relationship status: Not on file  Other Topics Concern  . Not on file  Social History Narrative  . Not on file   [name] Known Allergies Family History  Problem Relation Age of Onset  . Dementia Mother   . Osteoporosis Mother   . Breast cancer Sister   . Heart attack Brother   . Colon cancer Neg Hx   . Esophageal cancer Neg Hx   . Rectal cancer Neg Hx   . Stomach cancer Neg Hx      Current Outpatient Medications (Cardiovascular):  .  amLODipine (NORVASC) 2.5 MG tablet, Take 1 tablet (2.5 mg total) by mouth daily.   Current Outpatient Medications (Analgesics):  Marland Kitchen  SUMAtriptan (IMITREX) 100 MG tablet, TAKE 1/2-1 TABLET BY MOUTH AT ONSET MIGRAINE,MAY REPEAT ONCE 2HOURS IF NEED,MAX 2DOSES  IN 24 HOURS   Current Outpatient Medications (Other):  Marland Kitchen  buPROPion (WELLBUTRIN SR) 150 MG 12 hr tablet, Take 1 tablet (150 mg total) by mouth 2 (two) times daily. .  Cholecalciferol (VITAMIN D3) 1000 UNITS capsule, Take 1,000 Units by mouth daily.   .  clindamycin (CLINDAGEL) 1 % gel, Apply topically 2 (two) times daily. .  diphenhydramine-acetaminophen (TYLENOL PM) 25-500 MG TABS, Take 1 tablet by mouth at bedtime as needed. .  Docusate Sodium (COLACE PO), Take by mouth. .  fish oil-omega-3 fatty acids 1000 MG capsule, Take 1 g by mouth daily. .  hydrocortisone (ANUSOL-HC) 2.5 % rectal cream, Place 1 application rectally 2 (two) times daily. Marland Kitchen  MAGNESIUM PO, Take by mouth. .  Misc Natural Products (TART CHERRY ADVANCED PO), Take by mouth daily. .  niacin 250 MG tablet, Take 250 mg by mouth daily with breakfast.   .  pantoprazole (PROTONIX) 40 MG tablet, Take 1 tablet (40 mg total) by mouth  daily. .  traZODone (DESYREL) 50 MG tablet, Take 0.5-1 tablets (25-50 mg total) by mouth at bedtime as needed for sleep. .  Tretinoin, Facial Wrinkles, (TRETINOIN, EMOLLIENT,) 0.05 % CREA, Apply a small amount to affected area nightly    Past medical history, social, surgical and family history all reviewed in electronic medical record.  [name] pertanent information unless stated regarding to the chief complaint.   Review of Systems:  [name] headache, visual changes, nausea, vomiting, diarrhea, constipation, dizziness, abdominal pain, skin rash, fevers, chills, night sweats, weight loss, swollen lymph nodes, body aches, joint swelling,  chest pain, shortness of breath, mood changes.  Positive muscle aches  Objective  Blood pressure 116/72, height 5\' 5"  (1.651 m), weight 135 lb (61.2 kg).   General: [name] apparent distress alert and oriented x3 mood and affect normal, dressed appropriately.  HEENT: Pupils equal, extraocular movements intact  Respiratory: Patient's speak in full sentences and does not appear short of breath  Cardiovascular: [name] lower extremity edema, non tender, [name] erythema  Skin: Warm dry intact with [name] signs of infection or rash on extremities or on axial skeleton.  Abdomen: Soft nontender  Neuro: Cranial nerves II through XII are intact, neurovascularly intact in all extremities with 2+ DTRs and 2+ pulses.  Lymph: [name] lymphadenopathy of posterior or anterior cervical chain or axillae bilaterally.  Gait normal with good balance and coordination.  MSK:  Non tender with full range of motion and good stability and symmetric strength and tone of shoulders, elbows, wrist, hip, knee and ankles bilaterally.   Neck exam has loss of lordosis with significant crepitus.  Patient does have some decrease in range of motion in all planes.  Mild positive Spurling's with radicular symptoms in the C7 distribution on the right arm.   Back Exam:  Inspection: Unremarkable  Motion: Flexion 40 deg,  Extension 25 deg, Side Bending to 35 deg bilaterally, Rotation to 35 deg bilaterally  SLR laying: Negative  XSLR laying: Negative  Palpable tenderness: Tender to palpation of the paraspinal musculature lumbar spine. FABER: Tightness bilaterally. Sensory change: Gross sensation intact to all lumbar and sacral dermatomes.  Reflexes: 2+ at both patellar tendons, 2+ at achilles tendons, Babinski's downgoing.  Strength at foot  Plantar-flexion: 5/5 Dorsi-flexion: 5/5 Eversion: 5/5 Inversion: 5/5  Leg strength  Quad: 5/5 Hamstring: 5/5 Hip flexor: 5/5 Hip abductors: 4/5  Gait unremarkable.  Osteopathic findings C2 flexed rotated and side bent right C7 flexed rotated and side bent left T3 extended rotated and side  bent right inhaled third rib T6 extended rotated and side bent left    Impression and Recommendations:     This case required medical decision making of moderate complexity. The above documentation has been reviewed and is accurate and complete Lindsay Pulley, DO       Note: This dictation was prepared with Dragon dictation along with smaller phrase technology. Any transcriptional errors that result from this process are unintentional.

## 2018-04-24 ENCOUNTER — Encounter: Payer: Self-pay | Admitting: Family Medicine

## 2018-04-24 ENCOUNTER — Ambulatory Visit (INDEPENDENT_AMBULATORY_CARE_PROVIDER_SITE_OTHER): Payer: Medicare Other | Admitting: Family Medicine

## 2018-04-24 VITALS — BP 116/72 | Ht 65.0 in | Wt 135.0 lb

## 2018-04-24 DIAGNOSIS — M999 Biomechanical lesion, unspecified: Secondary | ICD-10-CM | POA: Diagnosis not present

## 2018-04-24 DIAGNOSIS — M5030 Other cervical disc degeneration, unspecified cervical region: Secondary | ICD-10-CM | POA: Diagnosis not present

## 2018-04-24 NOTE — Assessment & Plan Note (Signed)
Degenerative changes of the I do think it exacerbated secondary to patient's increasing anxiety and stress at the moment.  Discussed posture and ergonomics.  Discussed discussing her concerns with her primary care provider as well.  Icing regimen, posture and ergonomics.  Patient will follow-up with me again 4 to 8 weeks.

## 2018-04-24 NOTE — Patient Instructions (Signed)
You will do well  Keep it up  See me again in 4 weeks

## 2018-04-24 NOTE — Assessment & Plan Note (Signed)
Decision today to treat with OMT was based on Physical Exam  After verbal consent patient was treated with HVLA, ME, FPR techniques in cervical, thoracic, rib areas  Patient tolerated the procedure well with improvement in symptoms  Patient given exercises, stretches and lifestyle modifications  See medications in patient instructions if given  Patient will follow up in 4-8 weeks 

## 2018-05-02 ENCOUNTER — Encounter: Payer: Self-pay | Admitting: Family Medicine

## 2018-05-02 DIAGNOSIS — H3560 Retinal hemorrhage, unspecified eye: Secondary | ICD-10-CM | POA: Insufficient documentation

## 2018-05-18 ENCOUNTER — Other Ambulatory Visit: Payer: Self-pay | Admitting: Family Medicine

## 2018-05-18 DIAGNOSIS — Z1231 Encounter for screening mammogram for malignant neoplasm of breast: Secondary | ICD-10-CM

## 2018-05-22 ENCOUNTER — Ambulatory Visit (INDEPENDENT_AMBULATORY_CARE_PROVIDER_SITE_OTHER): Payer: Medicare Other | Admitting: Family Medicine

## 2018-05-22 ENCOUNTER — Encounter: Payer: Self-pay | Admitting: Family Medicine

## 2018-05-22 VITALS — BP 124/82 | HR 63 | Temp 98.3°F | Ht 65.0 in | Wt 134.6 lb

## 2018-05-22 DIAGNOSIS — H2513 Age-related nuclear cataract, bilateral: Secondary | ICD-10-CM | POA: Diagnosis not present

## 2018-05-22 DIAGNOSIS — H25012 Cortical age-related cataract, left eye: Secondary | ICD-10-CM | POA: Diagnosis not present

## 2018-05-22 DIAGNOSIS — H43813 Vitreous degeneration, bilateral: Secondary | ICD-10-CM | POA: Diagnosis not present

## 2018-05-22 DIAGNOSIS — I10 Essential (primary) hypertension: Secondary | ICD-10-CM

## 2018-05-22 DIAGNOSIS — H3561 Retinal hemorrhage, right eye: Secondary | ICD-10-CM | POA: Diagnosis not present

## 2018-05-22 NOTE — Patient Instructions (Signed)
BEFORE YOU LEAVE: -follow up: 3 months, come well hydrated  Check blood pressure 3x per week. If running higher then goal on average (> 120/70) then please call and we will send the increased dose of the Norvasc (amlodipine).   We recommend the following healthy lifestyle for LIFE: 1) Small portions. But, make sure to get regular (at least 3 per day), healthy meals and small healthy snacks if needed.  2) Eat a healthy clean diet.   TRY TO EAT: -at least 5-7 servings of low sugar, colorful, and nutrient rich vegetables per day (not corn, potatoes or bananas.) -berries are the best choice if you wish to eat fruit (only eat small amounts if trying to reduce weight)  -lean meets (fish, white meat of chicken or [place]) -vegan proteins for some meals - beans or tofu, whole grains, nuts and seeds -Replace bad fats with good fats - good fats include: fish, nuts and seeds, canola oil, olive oil -small amounts of low fat or non fat dairy -small amounts of100 % whole grains - check the lables -drink plenty of water  AVOID: -SUGAR, sweets, anything with added sugar, corn syrup or sweeteners - must read labels as even foods advertised as "healthy" often are loaded with sugar -if you must have a sweetener, small amounts of stevia may be best -sweetened beverages and artificially sweetened beverages -simple starches (rice, bread, potatoes, pasta, chips, etc - small amounts of 100% whole grains are ok) -red meat, pork, butter -fried foods, fast food, processed food, excessive dairy, eggs and coconut.  3)Get at least 150 minutes of sweaty aerobic exercise per week.  4)Reduce stress - consider counseling, meditation and relaxation to balance other aspects of your life.

## 2018-05-22 NOTE — Progress Notes (Signed)
HPI:  Using dictation device. Unfortunately this device frequently misinterprets words/phrases.  Follow up hypertension: -she finally agreed to low dose norvasc 1/20 -reports/denies: doing well, no CP/SOB. Tolerating medication well. Still lots of stress.  ROS: See pertinent positives and negatives per HPI.  Past Medical History:  Diagnosis Date  . COMMON MIGRAINE 09/20/2006  . DEPRESSION 06/11/2009  . GERD 06/11/2009  . Hypertension 04/17/2018  . Neck pain    eval with sports medicine in 2016/17  . Osteopenia 04/17/2018    Past Surgical History:  Procedure Laterality Date  . ABDOMINAL HYSTERECTOMY    . CHOLECYSTECTOMY    . OVARIAN CYST REMOVAL    . REPAIR EXTENSOR TENDON HAND    . TONSILLECTOMY      Family History  Problem Relation Age of Onset  . Dementia Mother   . Osteoporosis Mother   . Breast cancer Sister   . Heart attack Brother   . Colon cancer Neg Hx   . Esophageal cancer Neg Hx   . Rectal cancer Neg Hx   . Stomach cancer Neg Hx     SOCIAL HX: see hpi   Current Outpatient Medications:  .  amLODipine (NORVASC) 2.5 MG tablet, Take 1 tablet (2.5 mg total) by mouth daily., Disp: 90 tablet, Rfl: 0 .  buPROPion (WELLBUTRIN SR) 150 MG 12 hr tablet, Take 1 tablet (150 mg total) by mouth 2 (two) times daily., Disp: 180 tablet, Rfl: 1 .  Cholecalciferol (VITAMIN D3) 1000 UNITS capsule, Take 1,000 Units by mouth daily.  , Disp: , Rfl:  .  clindamycin (CLINDAGEL) 1 % gel, Apply topically 2 (two) times daily., Disp: 30 g, Rfl: 3 .  diphenhydramine-acetaminophen (TYLENOL PM) 25-500 MG TABS, Take 1 tablet by mouth at bedtime as needed., Disp: , Rfl:  .  Docusate Sodium (COLACE PO), Take by mouth., Disp: , Rfl:  .  fish oil-omega-3 fatty acids 1000 MG capsule, Take 1 g by mouth daily., Disp: , Rfl:  .  hydrocortisone (ANUSOL-HC) 2.5 % rectal cream, Place 1 application rectally 2 (two) times daily., Disp: 30 g, Rfl: 0 .  MAGNESIUM PO, Take by mouth., Disp: , Rfl:  .  Misc  Natural Products (TART CHERRY ADVANCED PO), Take by mouth daily., Disp: , Rfl:  .  niacin 250 MG tablet, Take 250 mg by mouth daily with breakfast.  , Disp: , Rfl:  .  pantoprazole (PROTONIX) 40 MG tablet, Take 1 tablet (40 mg total) by mouth daily., Disp: 30 tablet, Rfl: 3 .  SUMAtriptan (IMITREX) 100 MG tablet, TAKE 1/2-1 TABLET BY MOUTH AT ONSET MIGRAINE,MAY REPEAT ONCE 2HOURS IF NEED,MAX 2DOSES IN 24 HOURS, Disp: 9 tablet, Rfl: 3  EXAM:  Vitals:   05/22/18 0906  BP: 124/82  Pulse: 63  Temp: 98.3 F (36.8 C)    Body mass index is 22.4 kg/m.  GENERAL: vitals reviewed and listed above, alert, oriented, appears well hydrated and in no acute distress  HEENT: atraumatic, conjunttiva clear, no obvious abnormalities on inspection of external nose and ears  NECK: no obvious masses on inspection  LUNGS: clear to auscultation bilaterally, no wheezes, rales or rhonchi, good air movement  CV: HRRR, no peripheral edema  MS: moves all extremities without noticeable abnormality  PSYCH: pleasant and cooperative, no obvious depression or anxiety  ASSESSMENT AND PLAN:  Discussed the following assessment and plan:  Essential hypertension  -BP still a little elevated, but improved -advised increasing Norvasc to 5mg , she would like to continue at 2.5 and  monitor at home, she agrees to call if running high at home -follow up 3 months with labs then -Patient advised to return or notify a doctor immediately if symptoms worsen or persist or new concerns arise.  Patient Instructions  BEFORE YOU LEAVE: -follow up: 3 months, come well hydrated  Check blood pressure 3x per week. If running higher then goal on average (> 120/70) then please call and we will send the increased dose of the Norvasc (amlodipine).   We recommend the following healthy lifestyle for LIFE: 1) Small portions. But, make sure to get regular (at least 3 per day), healthy meals and small healthy snacks if needed.  2)  Eat a healthy clean diet.   TRY TO EAT: -at least 5-7 servings of low sugar, colorful, and nutrient rich vegetables per day (not corn, potatoes or bananas.) -berries are the best choice if you wish to eat fruit (only eat small amounts if trying to reduce weight)  -lean meets (fish, white meat of chicken or [place]) -vegan proteins for some meals - beans or tofu, whole grains, nuts and seeds -Replace bad fats with good fats - good fats include: fish, nuts and seeds, canola oil, olive oil -small amounts of low fat or non fat dairy -small amounts of100 % whole grains - check the lables -drink plenty of water  AVOID: -SUGAR, sweets, anything with added sugar, corn syrup or sweeteners - must read labels as even foods advertised as "healthy" often are loaded with sugar -if you must have a sweetener, small amounts of stevia may be best -sweetened beverages and artificially sweetened beverages -simple starches (rice, bread, potatoes, pasta, chips, etc - small amounts of 100% whole grains are ok) -red meat, pork, butter -fried foods, fast food, processed food, excessive dairy, eggs and coconut.  3)Get at least 150 minutes of sweaty aerobic exercise per week.  4)Reduce stress - consider counseling, meditation and relaxation to balance other aspects of your life.    Lucretia Kern, DO

## 2018-05-23 NOTE — Progress Notes (Signed)
[name] [address] [place], [place] [address] Phone: [phone] Subjective:     CC: Neck pain follow-up  IRS:[id number]  Lindsay [name] is a 68 y.o. female coming in with complaint of back pain. Patient states that she has been doing well. No change since last visit.  Patient has known degenerative disc disease.  Patient states that the tingling in the fingers seems to be stable with no weakness.  Patient has noticed some tightness.  Has been doing more working out and feels it is more soreness than anything else.      Past Medical History:  Diagnosis Date  . COMMON MIGRAINE 09/20/2006  . DEPRESSION 06/11/2009  . GERD 06/11/2009  . Hypertension 04/17/2018  . Neck pain    eval with sports medicine in 2016/17  . Osteopenia 04/17/2018   Past Surgical History:  Procedure Laterality Date  . ABDOMINAL HYSTERECTOMY    . CHOLECYSTECTOMY    . OVARIAN CYST REMOVAL    . REPAIR EXTENSOR TENDON HAND    . TONSILLECTOMY     Social History   Socioeconomic History  . Marital status: Married    Spouse name: Not on file  . Number of children: Not on file  . Years of education: Not on file  . Highest education level: Not on file  Occupational History  . Not on file  Social Needs  . Financial resource strain: Not on file  . Food insecurity:    Worry: Not on file    Inability: Not on file  . Transportation needs:    Medical: Not on file    Non-medical: Not on file  Tobacco Use  . Smoking status: Former Smoker    Types: Cigarettes    Last attempt to quit: 04/04/2004    Years since quitting: 14.1  . Smokeless tobacco: Never Used  Substance and Sexual Activity  . Alcohol use: Yes    Alcohol/week: 0.0 standard drinks    Comment: occ  . Drug use: No  . Sexual activity: Not on file  Lifestyle  . Physical activity:    Days per week: Not on file    Minutes per session: Not on file  . Stress: Not on file  Relationships  . Social connections:    Talks  on phone: Not on file    Gets together: Not on file    Attends religious service: Not on file    Active member of club or organization: Not on file    Attends meetings of clubs or organizations: Not on file    Relationship status: Not on file  Other Topics Concern  . Not on file  Social History Narrative  . Not on file   No Known Allergies Family History  Problem Relation Age of Onset  . Dementia Mother   . Osteoporosis Mother   . Breast cancer Sister   . Heart attack Brother   . Colon cancer Neg Hx   . Esophageal cancer Neg Hx   . Rectal cancer Neg Hx   . Stomach cancer Neg Hx      Current Outpatient Medications (Cardiovascular):  .  amLODipine (NORVASC) 2.5 MG tablet, Take 1 tablet (2.5 mg total) by mouth daily.   Current Outpatient Medications (Analgesics):  Marland Kitchen  SUMAtriptan (IMITREX) 100 MG tablet, TAKE 1/2-1 TABLET BY MOUTH AT ONSET MIGRAINE,MAY REPEAT ONCE 2HOURS IF NEED,MAX 2DOSES IN 24 HOURS   Current Outpatient Medications (Other):  Marland Kitchen  buPROPion [facility] SR) 150  MG 12 hr tablet, Take 1 tablet (150 mg total) by mouth 2 (two) times daily. .  Cholecalciferol (VITAMIN D3) 1000 UNITS capsule, Take 1,000 Units by mouth daily.   .  clindamycin (CLINDAGEL) 1 % gel, Apply topically 2 (two) times daily. .  diphenhydramine-acetaminophen (TYLENOL PM) 25-500 MG TABS, Take 1 tablet by mouth at bedtime as needed. .  Docusate Sodium (COLACE PO), Take by mouth. .  fish oil-omega-3 fatty acids 1000 MG capsule, Take 1 g by mouth daily. .  hydrocortisone (ANUSOL-HC) 2.5 % rectal cream, Place 1 application rectally 2 (two) times daily. Marland Kitchen  MAGNESIUM PO, Take by mouth. .  Misc Natural Products (TART CHERRY ADVANCED PO), Take by mouth daily. .  niacin 250 MG tablet, Take 250 mg by mouth daily with breakfast.   .  pantoprazole (PROTONIX) 40 MG tablet, Take 1 tablet (40 mg total) by mouth daily.    Past medical history, social, surgical and family history all reviewed in electronic  medical record.  No pertanent information unless stated regarding to the chief complaint.   Review of Systems:  No headache, visual changes, nausea, vomiting, diarrhea, constipation, dizziness, abdominal pain, skin rash, fevers, chills, night sweats, weight loss, swollen lymph nodes, body aches, joint swelling, muscle aches, chest pain, shortness of breath, mood changes.   Objective  Blood pressure 118/88, pulse 79, height 5\' 5"  (1.651 m), weight 134 lb (60.8 kg), SpO2 98 %.   General: No apparent distress alert and oriented x3 mood and affect normal, dressed appropriately.  HEENT: Pupils equal, extraocular movements intact  Respiratory: Patient's speak in full sentences and does not appear short of breath  Cardiovascular: No lower extremity edema, non tender, no erythema  Skin: Warm dry intact with no signs of infection or rash on extremities or on axial skeleton.  Abdomen: Soft nontender  Neuro: Cranial nerves II through XII are intact, neurovascularly intact in all extremities with 2+ DTRs and 2+ pulses.  Lymph: No lymphadenopathy of posterior or anterior cervical chain or axillae bilaterally.  Gait normal with good balance and coordination.  MSK:  Non tender with full range of motion and good stability and symmetric strength and tone of shoulders, elbows, wrist, hip, knee and ankles bilaterally.  Neck: Inspection lordosis. No palpable stepoffs. Negative Spurling's maneuver. Limited range of motion in all planes with mild crepitus noted. Grip strength and sensation normal in bilateral hands Strength good C4 to T1 distribution No sensory change to C4 to T1 Negative Hoffman sign bilaterally Reflexes normal Tightness of the trapezius bilaterally  Osteopathic findings  C2 flexed rotated and side bent right C4 flexed rotated and side bent right  C6 flexed rotated and side bent left T3 extended rotated and side bent right inhaled third rib      Impression and Recommendations:       This case required medical decision making of moderate complexity. The above documentation has been reviewed and is accurate and complete [name], DO       Note: This dictation was prepared with Dragon dictation along with smaller phrase technology. Any transcriptional errors that result from this process are unintentional.

## 2018-05-24 ENCOUNTER — Ambulatory Visit (INDEPENDENT_AMBULATORY_CARE_PROVIDER_SITE_OTHER): Payer: Medicare Other | Admitting: Family Medicine

## 2018-05-24 ENCOUNTER — Encounter: Payer: Self-pay | Admitting: Family Medicine

## 2018-05-24 VITALS — BP 118/88 | HR 79 | Ht 65.0 in | Wt 134.0 lb

## 2018-05-24 DIAGNOSIS — M999 Biomechanical lesion, unspecified: Secondary | ICD-10-CM

## 2018-05-24 DIAGNOSIS — M5030 Other cervical disc degeneration, unspecified cervical region: Secondary | ICD-10-CM

## 2018-05-24 NOTE — Assessment & Plan Note (Signed)
Decision today to treat with OMT was based on Physical Exam  After verbal consent patient was treated with HVLA, ME, FPR techniques in cervical, thoracic, rib areas  Patient tolerated the procedure well with improvement in symptoms  Patient given exercises, stretches and lifestyle modifications  See medications in patient instructions if given  Patient will follow up in 4-6 weeks 

## 2018-05-24 NOTE — Patient Instructions (Signed)
4 weeks You are the best!

## 2018-05-24 NOTE — Assessment & Plan Note (Signed)
Patient still does not want to do another epidural.  Discussed icing regimen and home exercise.  Discussed which activities to do which wants to avoid.  Posture and ergonomics.  We discussed with her not working on a more regular basis about timing of food as well as icing on a more regular basis.  Follow-up again 4 to 6 weeks

## 2018-05-31 ENCOUNTER — Ambulatory Visit
Admission: RE | Admit: 2018-05-31 | Discharge: 2018-05-31 | Disposition: A | Discharge status: Home / Self Care | Payer: Medicare Other | Source: Ambulatory Visit | Attending: Family Medicine | Admitting: Family Medicine

## 2018-05-31 DIAGNOSIS — Z1231 Encounter for screening mammogram for malignant neoplasm of breast: Secondary | ICD-10-CM | POA: Diagnosis not present

## 2018-06-20 NOTE — Progress Notes (Signed)
[name] [address] [place], [place] [address] Phone: [phone] Subjective:   [name], am serving as a scribe for Dr. Hulan Saas.   CC: Neck pain follow-up  QQP:[id number]  Lindsay [name] is a 68 y.o. female coming in with complaint of neck pain. Continues to have numbness in both hands with neck in forward flexion.  Patient does not know if it is more in the neck or if it is potentially in the hands.  Concern for potential carpal tunnel as well.      Past Medical History:  Diagnosis Date  . COMMON MIGRAINE 09/20/2006  . DEPRESSION 06/11/2009  . GERD 06/11/2009  . Hypertension 04/17/2018  . Neck pain    eval with sports medicine in 2016/17  . Osteopenia 04/17/2018   Past Surgical History:  Procedure Laterality Date  . ABDOMINAL HYSTERECTOMY    . CHOLECYSTECTOMY    . OVARIAN CYST REMOVAL    . REPAIR EXTENSOR TENDON HAND    . TONSILLECTOMY     Social History   Socioeconomic History  . Marital status: Married    Spouse name: Not on file  . Number of children: Not on file  . Years of education: Not on file  . Highest education level: Not on file  Occupational History  . Not on file  Social Needs  . Financial resource strain: Not on file  . Food insecurity:    Worry: Not on file    Inability: Not on file  . Transportation needs:    Medical: Not on file    Non-medical: Not on file  Tobacco Use  . Smoking status: Former Smoker    Types: Cigarettes    Last attempt to quit: 04/04/2004    Years since quitting: 14.2  . Smokeless tobacco: Never Used  Substance and Sexual Activity  . Alcohol use: Yes    Alcohol/week: 0.0 standard drinks    Comment: occ  . Drug use: No  . Sexual activity: Not on file  Lifestyle  . Physical activity:    Days per week: Not on file    Minutes per session: Not on file  . Stress: Not on file  Relationships  . Social connections:    Talks on phone: Not on file    Gets together: Not on file   Attends religious service: Not on file    Active member of club or organization: Not on file    Attends meetings of clubs or organizations: Not on file    Relationship status: Not on file  Other Topics Concern  . Not on file  Social History Narrative  . Not on file   No Known Allergies Family History  Problem Relation Age of Onset  . Dementia Mother   . Osteoporosis Mother   . Breast cancer Sister   . Heart attack Brother   . Colon cancer Neg Hx   . Esophageal cancer Neg Hx   . Rectal cancer Neg Hx   . Stomach cancer Neg Hx      Current Outpatient Medications (Cardiovascular):  .  amLODipine (NORVASC) 2.5 MG tablet, Take 1 tablet (2.5 mg total) by mouth daily.   Current Outpatient Medications (Analgesics):  Marland Kitchen  SUMAtriptan (IMITREX) 100 MG tablet, TAKE 1/2-1 TABLET BY MOUTH AT ONSET MIGRAINE,MAY REPEAT ONCE 2HOURS IF NEED,MAX 2DOSES IN 24 HOURS   Current Outpatient Medications (Other):  Marland Kitchen  buPROPion (WELLBUTRIN SR) 150 MG 12 hr tablet, Take 1 tablet (150  mg total) by mouth 2 (two) times daily. .  Cholecalciferol (VITAMIN D3) 1000 UNITS capsule, Take 1,000 Units by mouth daily.   .  clindamycin (CLINDAGEL) 1 % gel, Apply topically 2 (two) times daily. .  diphenhydramine-acetaminophen (TYLENOL PM) 25-500 MG TABS, Take 1 tablet by mouth at bedtime as needed. .  Docusate Sodium (COLACE PO), Take by mouth. .  fish oil-omega-3 fatty acids 1000 MG capsule, Take 1 g by mouth daily. .  hydrocortisone (ANUSOL-HC) 2.5 % rectal cream, Place 1 application rectally 2 (two) times daily. Marland Kitchen  MAGNESIUM PO, Take by mouth. .  Misc Natural Products (TART CHERRY ADVANCED PO), Take by mouth daily. .  niacin 250 MG tablet, Take 250 mg by mouth daily with breakfast.   .  pantoprazole (PROTONIX) 40 MG tablet, Take 1 tablet (40 mg total) by mouth daily.    Past medical history, social, surgical and family history all reviewed in electronic medical record.  No pertanent information unless stated  regarding to the chief complaint.   Review of Systems:  No headache, visual changes, nausea, vomiting, diarrhea, constipation, dizziness, abdominal pain, skin rash, fevers, chills, night sweats, weight loss, swollen lymph nodes, body aches, joint swelling,chest pain, shortness of breath, mood changes.  Positive muscle aches  Objective  Blood pressure 118/82, pulse 71, height 5\' 5"  (1.651 m), weight 134 lb (60.8 kg), SpO2 98 %.    General: No apparent distress alert and oriented x3 mood and affect normal, dressed appropriately.  HEENT: Pupils equal, extraocular movements intact  Respiratory: Patient's speak in full sentences and does not appear short of breath  Cardiovascular: No lower extremity edema, non tender, no erythema  Skin: Warm dry intact with no signs of infection or rash on extremities or on axial skeleton.  Abdomen: Soft nontender  Neuro: Cranial nerves II through XII are intact, neurovascularly intact in all extremities with 2+ DTRs and 2+ pulses.  Lymph: No lymphadenopathy of posterior or anterior cervical chain or axillae bilaterally.  Gait normal with good balance and coordination.  MSK:  Non tender with full range of motion and good stability and symmetric strength and tone of shoulders, elbows, wrist, hip, knee and ankles bilaterally.  Mild arthritic changes Neck: Inspection loss of lordosis. No palpable stepoffs. Positive Spurling's maneuver. Limited range of motion in all planes of 5 to 10 degrees Grip strength and sensation normal in bilateral hands Strength good C4 to T1 distribution No sensory change to C4 to T1 Negative Hoffman sign bilaterally Reflexes normal Tightness in the trapezius  Osteopathic findings C2 flexed rotated and side bent right C4 flexed rotated and side bent left C6 flexed rotated and side bent left T3 extended rotated and side bent right inhaled third rib     Impression and Recommendations:     This case required medical decision  making of moderate complexity. The above documentation has been reviewed and is accurate and complete [name], DO       Note: This dictation was prepared with Dragon dictation along with smaller phrase technology. Any transcriptional errors that result from this process are unintentional.

## 2018-06-21 ENCOUNTER — Other Ambulatory Visit: Payer: Self-pay

## 2018-06-21 ENCOUNTER — Ambulatory Visit (INDEPENDENT_AMBULATORY_CARE_PROVIDER_SITE_OTHER): Payer: Medicare Other | Admitting: Family Medicine

## 2018-06-21 VITALS — BP 118/82 | HR 71 | Ht 65.0 in | Wt 134.0 lb

## 2018-06-21 DIAGNOSIS — M542 Cervicalgia: Secondary | ICD-10-CM | POA: Diagnosis not present

## 2018-06-21 DIAGNOSIS — M999 Biomechanical lesion, unspecified: Secondary | ICD-10-CM

## 2018-06-21 NOTE — Assessment & Plan Note (Signed)
Discussed posture and ergonomics.  Discussed which activities to do which wants to avoid.  Patient will try to increase activity slowly.  Follow-up again 8 weeks.  We did discuss the possibility of nerve conduction study or the possibility of carpal tunnel injections if patient would like.

## 2018-06-21 NOTE — Patient Instructions (Addendum)
Good to see you  Good to see you  Consider nerve conduction test or carpal tunnel injections  I will see you again soon

## 2018-06-21 NOTE — Assessment & Plan Note (Signed)
Decision today to treat with OMT was based on Physical Exam  After verbal consent patient was treated with HVLA, ME, FPR techniques in cervical, thoracic, rib areas  Patient tolerated the procedure well with improvement in symptoms  Patient given exercises, stretches and lifestyle modifications  See medications in patient instructions if given  Patient will follow up in 8 weeks 

## 2018-07-02 ENCOUNTER — Other Ambulatory Visit: Payer: Self-pay | Admitting: Family Medicine

## 2018-07-05 ENCOUNTER — Other Ambulatory Visit: Payer: Self-pay | Admitting: Family Medicine

## 2018-07-06 MED ORDER — HYDROCORTISONE 2.5 % RE CREA: 1.0000 "application " | TOPICAL_CREAM | Freq: Two times a day (BID) | RECTAL | 0 refills | Status: DC

## 2018-07-08 ENCOUNTER — Encounter: Payer: Self-pay | Admitting: Family Medicine

## 2018-07-19 NOTE — Telephone Encounter (Signed)
I left a message for the pt to return my call. 

## 2018-07-19 NOTE — Telephone Encounter (Signed)
Lindsay [name],  Can you call pt? Looks like due for check in for BP and can discuss this medication and symptoms as well. Can she do virtual visit today?

## 2018-07-20 NOTE — Telephone Encounter (Signed)
I spoke with the pt on 4/16 and scheduled an appt for 5/19 per pts request.

## 2018-07-30 ENCOUNTER — Encounter: Payer: Self-pay | Admitting: Family Medicine

## 2018-08-02 ENCOUNTER — Ambulatory Visit: Payer: Self-pay

## 2018-08-02 ENCOUNTER — Ambulatory Visit (INDEPENDENT_AMBULATORY_CARE_PROVIDER_SITE_OTHER): Payer: Medicare Other | Admitting: Family Medicine

## 2018-08-02 ENCOUNTER — Encounter: Payer: Self-pay | Admitting: Family Medicine

## 2018-08-02 VITALS — BP 132/84 | HR 64 | Ht 65.0 in | Wt 133.0 lb

## 2018-08-02 DIAGNOSIS — M5030 Other cervical disc degeneration, unspecified cervical region: Secondary | ICD-10-CM

## 2018-08-02 DIAGNOSIS — G5601 Carpal tunnel syndrome, right upper limb: Secondary | ICD-10-CM

## 2018-08-02 DIAGNOSIS — M25531 Pain in right wrist: Secondary | ICD-10-CM

## 2018-08-02 DIAGNOSIS — M999 Biomechanical lesion, unspecified: Secondary | ICD-10-CM

## 2018-08-02 NOTE — Progress Notes (Signed)
[name] [address] [place], [place] [address] Phone: [phone] Subjective:   Lindsay [name], am serving as a scribe for Dr. Hulan Saas.   CC: Neck pain and wrist pain follow-up  OIN:[id number]  Lindsay [name] is a 68 y.o. female coming in with complaint of neck and wrist pain. Patient does feel like the epidurals have helped improve her neck pain. Does still have numbness in thumb and 2nd and 3rd fingers bilaterally. Right hand is worse than left. Patient would like an injection in right wrist today as the pain is waking her up at night.   Known degenerative disc disease of the cervical spine that is fairly significant.  Has responded to epidurals but has wanted to avoid it.  Seems that most of the pain seems to be mechanical.    Past Medical History:  Diagnosis Date  . COMMON MIGRAINE 09/20/2006  . DEPRESSION 06/11/2009  . GERD 06/11/2009  . Hypertension 04/17/2018  . Neck pain    eval with sports medicine in 2016/17  . Osteopenia 04/17/2018   Past Surgical History:  Procedure Laterality Date  . ABDOMINAL HYSTERECTOMY    . CHOLECYSTECTOMY    . OVARIAN CYST REMOVAL    . REPAIR EXTENSOR TENDON HAND    . TONSILLECTOMY     Social History   Socioeconomic History  . Marital status: Married    Spouse name: Not on file  . Number of children: Not on file  . Years of education: Not on file  . Highest education level: Not on file  Occupational History  . Not on file  Social Needs  . Financial resource strain: Not on file  . Food insecurity:    Worry: Not on file    Inability: Not on file  . Transportation needs:    Medical: Not on file    Non-medical: Not on file  Tobacco Use  . Smoking status: Former Smoker    Types: Cigarettes    Last attempt to quit: 04/04/2004    Years since quitting: 14.3  . Smokeless tobacco: Never Used  Substance and Sexual Activity  . Alcohol use: Yes    Alcohol/week: 0.0 standard drinks    Comment: occ  .  Drug use: [name]  . Sexual activity: Not on file  Lifestyle  . Physical activity:    Days per week: Not on file    Minutes per session: Not on file  . Stress: Not on file  Relationships  . Social connections:    Talks on phone: Not on file    Gets together: Not on file    Attends religious service: Not on file    Active member of club or organization: Not on file    Attends meetings of clubs or organizations: Not on file    Relationship status: Not on file  Other Topics Concern  . Not on file  Social History Narrative  . Not on file   [name] Known Allergies Family History  Problem Relation Age of Onset  . Dementia Mother   . Osteoporosis Mother   . Breast cancer Sister   . Heart attack Brother   . Colon cancer Neg Hx   . Esophageal cancer Neg Hx   . Rectal cancer Neg Hx   . Stomach cancer Neg Hx      Current Outpatient Medications (Cardiovascular):  .  amLODipine (NORVASC) 2.5 MG tablet, TAKE 1 TABLET BY MOUTH EVERY DAY   Current Outpatient Medications (  Analgesics):  Marland Kitchen  SUMAtriptan (IMITREX) 100 MG tablet, TAKE 1/2-1 TABLET BY MOUTH AT ONSET MIGRAINE,MAY REPEAT ONCE 2HOURS IF NEED,MAX 2DOSES IN 24 HOURS   Current Outpatient Medications (Other):  Marland Kitchen  buPROPion (WELLBUTRIN SR) 150 MG 12 hr tablet, Take 1 tablet (150 mg total) by mouth 2 (two) times daily. .  Cholecalciferol (VITAMIN D3) 1000 UNITS capsule, Take 1,000 Units by mouth daily.   .  clindamycin (CLINDAGEL) 1 % gel, Apply topically 2 (two) times daily. .  diphenhydramine-acetaminophen (TYLENOL PM) 25-500 MG TABS, Take 1 tablet by mouth at bedtime as needed. .  Docusate Sodium (COLACE PO), Take by mouth. .  fish oil-omega-3 fatty acids 1000 MG capsule, Take 1 g by mouth daily. .  hydrocortisone (ANUSOL-HC) 2.5 % rectal cream, Place 1 application rectally 2 (two) times daily. Marland Kitchen  MAGNESIUM PO, Take by mouth. .  Misc Natural Products (TART CHERRY ADVANCED PO), Take by mouth daily. .  niacin 250 MG tablet, Take 250 mg by  mouth daily with breakfast.   .  pantoprazole (PROTONIX) 40 MG tablet, Take 1 tablet (40 mg total) by mouth daily.    Past medical history, social, surgical and family history all reviewed in electronic medical record.  [name] pertanent information unless stated regarding to the chief complaint.   Review of Systems:  [name] headache, visual changes, nausea, vomiting, diarrhea, constipation, dizziness, abdominal pain, skin rash, fevers, chills, night sweats, weight loss, swollen lymph nodes, body aches, joint swelling, , chest pain, shortness of breath, mood changes.  Positive muscle aches  Objective  Blood pressure 132/84, pulse 64, height 5\' 5"  (1.651 m), weight 133 lb (60.3 kg), SpO2 99 %.   General: [name] apparent distress alert and oriented x3 mood and affect normal, dressed appropriately.  HEENT: Pupils equal, extraocular movements intact  Respiratory: Patient's speak in full sentences and does not appear short of breath  Cardiovascular: [name] lower extremity edema, non tender, [name] erythema  Skin: Warm dry intact with [name] signs of infection or rash on extremities or on axial skeleton.  Abdomen: Soft nontender  Neuro: Cranial nerves II through XII are intact, neurovascularly intact in all extremities with 2+ DTRs and 2+ pulses.  Lymph: [name] lymphadenopathy of posterior or anterior cervical chain or axillae bilaterally.  Gait normal with good balance and coordination.  MSK:  Non tender with full range of motion and good stability and symmetric strength and tone of shoulders, elbows,  hip, knee and ankles bilaterally.  Right wrist exam shows a patient does have a positive Tinel's.  Mild arthritic changes of multiple joints.  Full range of motion of the wrist.  Good grip strength.  Contralateral wrist unremarkable  Limited exam shows loss of lordosis.  Tightness of the shoulder blades bilaterally.  Mild crepitus.  Negative Spurling's.  Procedure: Real-time Ultrasound Guided Injection of right carpal tunnel  Device: GE Logiq Q7  Ultrasound guided injection is preferred based studies that show increased duration, increased effect, greater accuracy, decreased procedural pain, increased response rate with ultrasound guided versus blind injection.  Verbal informed consent obtained.  Time-out conducted.  Noted [name] overlying erythema, induration, or other signs of local infection.  Skin prepped in a sterile fashion.  Local anesthesia: Topical Ethyl chloride.  With sterile technique and under real time ultrasound guidance:  median nerve visualized.  23g 5/8 inch needle inserted distal to proximal approach into nerve sheath. Pictures taken nfor needle placement. Patient did have injection of 2 cc of 1% lidocaine, 1 cc of  0.5% Marcaine, and 1 cc of Kenalog 40 mg/dL. Completed without difficulty  Pain immediately resolved suggesting accurate placement of the medication.  Advised to call if fevers/chills, erythema, induration, drainage, or persistent bleeding.  Images permanently stored and available for review in the ultrasound unit.  Impression: Technically successful ultrasound guided injection.  Osteopathic findings  C4 flexed rotated and side bent left C6 flexed rotated and side bent left T3 extended rotated and side bent right inhaled third rib T9 extended rotated and side bent left L2 flexed rotated and side bent right Sacrum right on right    Impression and Recommendations:     This case required medical decision making of moderate complexity. The above documentation has been reviewed and is accurate and complete [name], DO       Note: This dictation was prepared with Dragon dictation along with smaller phrase technology. Any transcriptional errors that result from this process are unintentional.

## 2018-08-02 NOTE — Patient Instructions (Signed)
Good to see you  Ice 20 minutes 2 times daily. Usually after activity and before bed.

## 2018-08-03 ENCOUNTER — Encounter: Payer: Self-pay | Admitting: Family Medicine

## 2018-08-03 DIAGNOSIS — G5601 Carpal tunnel syndrome, right upper limb: Secondary | ICD-10-CM | POA: Insufficient documentation

## 2018-08-03 DIAGNOSIS — G5603 Carpal tunnel syndrome, bilateral upper limbs: Secondary | ICD-10-CM | POA: Insufficient documentation

## 2018-08-03 NOTE — Assessment & Plan Note (Signed)
Decision today to treat with OMT was based on Physical Exam  After verbal consent patient was treated with HVLA, ME, FPR techniques in cervical, thoracic, ribl areas  Patient tolerated the procedure well with improvement in symptoms  Patient given exercises, stretches and lifestyle modifications  See medications in patient instructions if given  Patient will follow up in 6 weeks

## 2018-08-03 NOTE — Assessment & Plan Note (Signed)
Patient given injection.  Tolerated procedure well.  Discussed icing regimen and home exercise.  Discussed which activities to do which wants to avoid.  Increase activity sports but we did discuss bracing at night.  Follow-up again in 4 weeks

## 2018-08-09 ENCOUNTER — Encounter: Payer: Self-pay | Admitting: Internal Medicine

## 2018-08-09 ENCOUNTER — Ambulatory Visit (INDEPENDENT_AMBULATORY_CARE_PROVIDER_SITE_OTHER): Payer: Medicare Other | Admitting: Internal Medicine

## 2018-08-09 ENCOUNTER — Other Ambulatory Visit: Payer: Self-pay

## 2018-08-09 VITALS — BP 140/98 | HR 75 | Temp 97.6°F | Wt 133.0 lb

## 2018-08-09 DIAGNOSIS — H8112 Benign paroxysmal vertigo, left ear: Secondary | ICD-10-CM | POA: Diagnosis not present

## 2018-08-09 NOTE — Progress Notes (Signed)
Acute Office Visit     CC/Reason for Visit: Vertigo  HPI: Lindsay [name] is a 68 y.o. female who is coming in today for the above mentioned reasons. Started experiencing vertigo 3 days ago. Feels like room is spinning around her. Worse when laying in bed on her left side. No recent URI or sinus drainage. Not worse when going from sitting to standing. Has never had this before. No focal weakness. Has chronic right arm numbness, already under the care of neuro and sports medicine.   Past Medical/Surgical History: Past Medical History:  Diagnosis Date  . COMMON MIGRAINE 09/20/2006  . DEPRESSION 06/11/2009  . GERD 06/11/2009  . Hypertension 04/17/2018  . Neck pain    eval with sports medicine in 2016/17  . Osteopenia 04/17/2018    Past Surgical History:  Procedure Laterality Date  . ABDOMINAL HYSTERECTOMY    . CHOLECYSTECTOMY    . OVARIAN CYST REMOVAL    . REPAIR EXTENSOR TENDON HAND    . TONSILLECTOMY      Social History:  reports that she quit smoking about 14 years ago. Her smoking use included cigarettes. She has never used smokeless tobacco. She reports current alcohol use. She reports that she does not use drugs.  Allergies: No Known Allergies  Family History:  Family History  Problem Relation Age of Onset  . Dementia Mother   . Osteoporosis Mother   . Breast cancer Sister   . Heart attack Brother   . Colon cancer Neg Hx   . Esophageal cancer Neg Hx   . Rectal cancer Neg Hx   . Stomach cancer Neg Hx      Current Outpatient Medications:  .  amLODipine (NORVASC) 2.5 MG tablet, TAKE 1 TABLET BY MOUTH EVERY DAY, Disp: 90 tablet, Rfl: 0 .  buPROPion (WELLBUTRIN SR) 150 MG 12 hr tablet, Take 1 tablet (150 mg total) by mouth 2 (two) times daily., Disp: 180 tablet, Rfl: 1 .  Cholecalciferol (VITAMIN D3) 1000 UNITS capsule, Take 1,000 Units by mouth daily.  , Disp: , Rfl:  .  clindamycin (CLINDAGEL) 1 % gel, Apply topically 2 (two) times daily., Disp: 30 g, Rfl: 3 .   diphenhydramine-acetaminophen (TYLENOL PM) 25-500 MG TABS, Take 1 tablet by mouth at bedtime as needed., Disp: , Rfl:  .  Docusate Sodium (COLACE PO), Take by mouth., Disp: , Rfl:  .  fish oil-omega-3 fatty acids 1000 MG capsule, Take 1 g by mouth daily., Disp: , Rfl:  .  hydrocortisone (ANUSOL-HC) 2.5 % rectal cream, Place 1 application rectally 2 (two) times daily., Disp: 30 g, Rfl: 0 .  MAGNESIUM PO, Take by mouth., Disp: , Rfl:  .  Misc Natural Products (TART CHERRY ADVANCED PO), Take by mouth daily., Disp: , Rfl:  .  niacin 250 MG tablet, Take 250 mg by mouth daily with breakfast.  , Disp: , Rfl:  .  pantoprazole (PROTONIX) 40 MG tablet, Take 1 tablet (40 mg total) by mouth daily., Disp: 30 tablet, Rfl: 3 .  SUMAtriptan (IMITREX) 100 MG tablet, TAKE 1/2-1 TABLET BY MOUTH AT ONSET MIGRAINE,MAY REPEAT ONCE 2HOURS IF NEED,MAX 2DOSES IN 24 HOURS, Disp: 9 tablet, Rfl: 3  Review of Systems:  Constitutional: Denies fever, chills, diaphoresis, appetite change and fatigue.  HEENT: Denies photophobia, eye pain, redness, hearing loss, ear pain, congestion, sore throat, rhinorrhea, sneezing, mouth sores, trouble swallowing, neck pain, neck stiffness and tinnitus.   Respiratory: Denies SOB, DOE, cough, chest tightness,  and  wheezing.   Cardiovascular: Denies chest pain, palpitations and leg swelling.  Gastrointestinal: Denies nausea, vomiting, abdominal pain, diarrhea, constipation, blood in stool and abdominal distention.  Genitourinary: Denies dysuria, urgency, frequency, hematuria, flank pain and difficulty urinating.  Endocrine: Denies: hot or cold intolerance, sweats, changes in hair or nails, polyuria, polydipsia. Musculoskeletal: Denies myalgias, back pain, joint swelling, arthralgias and gait problem.  Skin: Denies pallor, rash and wound.  Neurological: Denies  seizures, syncope, weakness, light-headedness, numbness and headaches.  Hematological: Denies adenopathy. Easy bruising, personal or  family bleeding history  Psychiatric/Behavioral: Denies suicidal ideation, mood changes, confusion, nervousness, sleep disturbance and agitation    Physical Exam: Vitals:   08/09/18 1356  BP: (!) 140/98  Pulse: 75  Temp: 97.6 F (36.4 C)  TempSrc: Oral  SpO2: 97%  Weight: 133 lb (60.3 kg)    Body mass index is 22.13 kg/m.   Constitutional: NAD, calm, comfortable Eyes: PERRL, lids and conjunctivae normal ENMT: Mucous membranes are moist. Positive dizziness and nystagmus with Dixie Hallpike on left. Neck: normal, supple, no masses, no thyromegaly Musculoskeletal: no clubbing / cyanosis. No joint deformity upper and lower extremities. Good ROM, no contractures. Normal muscle tone.  Skin: no rashes, lesions, ulcers. No induration Neurologic: CN 2-12 grossly intact. Sensation intact, DTR normal. Strength 5/5 in all 4.  Psychiatric: Normal judgment and insight. Alert and oriented x 3. Normal mood.    Impression and Plan:  Benign paroxysmal positional vertigo of left ear -Vertigo and nystagmus elicited with Dixie Halpike on left. -Have performed Epley maneuver with success.   Time spent: 25 minutes. Greater than 50% of this time was spent in direct contact with the patient, coordinating care and discussing etiologies of vertigo and performing Epley maneuver.   Patient Instructions  -Hope you feel better!  -May perform the Epley maneuver as needed at home. Start on the left side.   Benign Positional Vertigo Vertigo is the feeling that you or your surroundings are moving when they are not. Benign positional vertigo is the most common form of vertigo. This is usually a harmless condition (benign). This condition is positional. This means that symptoms are triggered by certain movements and positions. This condition can be dangerous if it occurs while you are doing something that could cause harm to you or others. This includes activities such as driving or operating machinery.  What are the causes? In many cases, the cause of this condition is not known. It may be caused by a disturbance in an area of the inner ear that helps your brain to sense movement and balance. This disturbance can be caused by:  Viral infection (labyrinthitis).  Head injury.  Repetitive motion, such as jumping, dancing, or running. What increases the risk? You are more likely to develop this condition if:  You are a woman.  You are 38 years of age or older. What are the signs or symptoms? Symptoms of this condition usually happen when you move your head or your eyes in different directions. Symptoms may start suddenly, and usually last for less than a minute. They include:  Loss of balance and falling.  Feeling like you are spinning or moving.  Feeling like your surroundings are spinning or moving.  Nausea and vomiting.  Blurred vision.  Dizziness.  Involuntary eye movement (nystagmus). Symptoms can be mild and cause only minor problems, or they can be severe and interfere with daily life. Episodes of benign positional vertigo may return (recur) over time. Symptoms may improve over time.  How is this diagnosed? This condition may be diagnosed based on:  Your medical history.  Physical exam of the head, neck, and ears.  Tests, such as: ? MRI. ? CT scan. ? Eye movement tests. Your health care provider may ask you to change positions quickly while he or she watches you for symptoms of benign positional vertigo, such as nystagmus. Eye movement may be tested with a variety of exams that are designed to evaluate or stimulate vertigo. ? An electroencephalogram (EEG). This records electrical activity in your brain. ? Hearing tests. You may be referred to a health care provider who specializes in ear, nose, and throat (ENT) problems (otolaryngologist) or a provider who specializes in disorders of the nervous system (neurologist). How is this treated?  This condition may be  treated in a session in which your health care provider moves your head in specific positions to adjust your inner ear back to normal. Treatment for this condition may take several sessions. Surgery may be needed in severe cases, but this is rare. In some cases, benign positional vertigo may resolve on its own in 2-4 weeks. Follow these instructions at home: Safety  Move slowly. Avoid sudden body or head movements or certain positions, as told by your health care provider.  Avoid driving until your health care provider says it is safe for you to do so.  Avoid operating heavy machinery until your health care provider says it is safe for you to do so.  Avoid doing any tasks that would be dangerous to you or others if vertigo occurs.  If you have trouble walking or keeping your balance, try using a cane for stability. If you feel dizzy or unstable, sit down right away.  Return to your normal activities as told by your health care provider. Ask your health care provider what activities are safe for you. General instructions  Take over-the-counter and prescription medicines only as told by your health care provider.  Drink enough fluid to keep your urine pale yellow.  Keep all follow-up visits as told by your health care provider. This is important. Contact a health care provider if:  You have a fever.  Your condition gets worse or you develop new symptoms. How to Perform the Epley Maneuver The Epley maneuver is an exercise that relieves symptoms of vertigo. Vertigo is the feeling that you or your surroundings are moving when they are not. When you feel vertigo, you may feel like the room is spinning and have trouble walking. Dizziness is a little different than vertigo. When you are dizzy, you may feel unsteady or light-headed. You can do this maneuver at home whenever you have symptoms of vertigo. You can do it up to 3 times a day until your symptoms go away. Even though the Epley  maneuver may relieve your vertigo for a few weeks, it is possible that your symptoms will return. This maneuver relieves vertigo, but it does not relieve dizziness. What are the risks? If it is done correctly, the Epley maneuver is considered safe. Sometimes it can lead to dizziness or nausea that goes away after a short time. If you develop other symptoms, such as changes in vision, weakness, or numbness, stop doing the maneuver and call your health care provider. How to perform the Epley maneuver 1. Sit on the edge of a bed or table with your back straight and your legs extended or hanging over the edge of the bed or table. 2. Turn your head halfway toward  the affected ear or side. 3. Lie backward quickly with your head turned until you are lying flat on your back. You may want to position a pillow under your shoulders. 4. Hold this position for 30 seconds. You may experience an attack of vertigo. This is normal. 5. Turn your head to the opposite direction until your unaffected ear is facing the floor. 6. Hold this position for 30 seconds. You may experience an attack of vertigo. This is normal. Hold this position until the vertigo stops. 7. Turn your whole body to the same side as your head. Hold for another 30 seconds. 8. Sit back up. You can repeat this exercise up to 3 times a day. Follow these instructions at home:  After doing the Epley maneuver, you can return to your normal activities.  Ask your health care provider if there is anything you should do at home to prevent vertigo. He or she may recommend that you: ? Keep your head raised (elevated) with two or more pillows while you sleep. ? Do not sleep on the side of your affected ear. ? Get up slowly from bed. ? Avoid sudden movements during the day. ? Avoid extreme head movement, like looking up or bending over. Contact a health care provider if:  Your vertigo gets worse.  You have other symptoms, including: ? Nausea. ?  Vomiting. ? Headache. Get help right away if:  You have vision changes.  You have a severe or worsening headache or neck pain.  You cannot stop vomiting.  You have new numbness or weakness in any part of your body. Summary  Vertigo is the feeling that you or your surroundings are moving when they are not.  The Epley maneuver is an exercise that relieves symptoms of vertigo.  If the Epley maneuver is done correctly, it is considered safe. You can do it up to 3 times a day. This information is not intended to replace advice given to you by your health care provider. Make sure you discuss any questions you have with your health care provider. Document Released: 03/26/2013 Document Revised: 02/09/2016 Document Reviewed: 02/09/2016 Elsevier Interactive Patient Education  [employer].  Your family or friends notice any behavioral changes.  You have nausea or vomiting that gets worse.  You have numbness or a "pins and needles" sensation. Get help right away if you:  Have difficulty speaking or moving.  Are always dizzy.  Faint.  Develop severe headaches.  Have weakness in your legs or arms.  Have changes in your hearing or vision.  Develop a stiff neck.  Develop sensitivity to light. Summary  Vertigo is the feeling that you or your surroundings are moving when they are not. Benign positional vertigo is the most common form of vertigo.  The cause of this condition is not known. It may be caused by a disturbance in an area of the inner ear that helps your brain to sense movement and balance.  Symptoms include loss of balance and falling, feeling that you or your surroundings are moving, nausea and vomiting, and blurred vision.  This condition can be diagnosed based on symptoms, physical exam, and other tests, such as MRI, CT scan, eye movement tests, and hearing tests.  Follow safety instructions as told by your health care provider. You will also be told when to  contact your health care provider in case of problems. This information is not intended to replace advice given to you by your health care provider. Make sure  you discuss any questions you have with your health care provider. Document Released: 12/27/2005 Document Revised: 08/30/2017 Document Reviewed: 08/30/2017 Elsevier Interactive Patient Education  2019 [place], [place] [facility]

## 2018-08-09 NOTE — Patient Instructions (Signed)
-Hope you feel better!  -May perform the Epley maneuver as needed at home. Start on the left side.   Benign Positional Vertigo Vertigo is the feeling that you or your surroundings are moving when they are not. Benign positional vertigo is the most common form of vertigo. This is usually a harmless condition (benign). This condition is positional. This means that symptoms are triggered by certain movements and positions. This condition can be dangerous if it occurs while you are doing something that could cause harm to you or others. This includes activities such as driving or operating machinery. What are the causes? In many cases, the cause of this condition is not known. It may be caused by a disturbance in an area of the inner ear that helps your brain to sense movement and balance. This disturbance can be caused by:  Viral infection (labyrinthitis).  Head injury.  Repetitive motion, such as jumping, dancing, or running. What increases the risk? You are more likely to develop this condition if:  You are a woman.  You are 66 years of age or older. What are the signs or symptoms? Symptoms of this condition usually happen when you move your head or your eyes in different directions. Symptoms may start suddenly, and usually last for less than a minute. They include:  Loss of balance and falling.  Feeling like you are spinning or moving.  Feeling like your surroundings are spinning or moving.  Nausea and vomiting.  Blurred vision.  Dizziness.  Involuntary eye movement (nystagmus). Symptoms can be mild and cause only minor problems, or they can be severe and interfere with daily life. Episodes of benign positional vertigo may return (recur) over time. Symptoms may improve over time. How is this diagnosed? This condition may be diagnosed based on:  Your medical history.  Physical exam of the head, neck, and ears.  Tests, such as: ? MRI. ? CT scan. ? Eye movement tests.  Your health care provider may ask you to change positions quickly while he or she watches you for symptoms of benign positional vertigo, such as nystagmus. Eye movement may be tested with a variety of exams that are designed to evaluate or stimulate vertigo. ? An electroencephalogram (EEG). This records electrical activity in your brain. ? Hearing tests. You may be referred to a health care provider who specializes in ear, nose, and throat (ENT) problems (otolaryngologist) or a provider who specializes in disorders of the nervous system (neurologist). How is this treated?  This condition may be treated in a session in which your health care provider moves your head in specific positions to adjust your inner ear back to normal. Treatment for this condition may take several sessions. Surgery may be needed in severe cases, but this is rare. In some cases, benign positional vertigo may resolve on its own in 2-4 weeks. Follow these instructions at home: Safety  Move slowly. Avoid sudden body or head movements or certain positions, as told by your health care provider.  Avoid driving until your health care provider says it is safe for you to do so.  Avoid operating heavy machinery until your health care provider says it is safe for you to do so.  Avoid doing any tasks that would be dangerous to you or others if vertigo occurs.  If you have trouble walking or keeping your balance, try using a cane for stability. If you feel dizzy or unstable, sit down right away.  Return to your normal activities as told  by your health care provider. Ask your health care provider what activities are safe for you. General instructions  Take over-the-counter and prescription medicines only as told by your health care provider.  Drink enough fluid to keep your urine pale yellow.  Keep all follow-up visits as told by your health care provider. This is important. Contact a health care provider if:  You have a  fever.  Your condition gets worse or you develop new symptoms. How to Perform the Epley Maneuver The Epley maneuver is an exercise that relieves symptoms of vertigo. Vertigo is the feeling that you or your surroundings are moving when they are not. When you feel vertigo, you may feel like the room is spinning and have trouble walking. Dizziness is a little different than vertigo. When you are dizzy, you may feel unsteady or light-headed. You can do this maneuver at home whenever you have symptoms of vertigo. You can do it up to 3 times a day until your symptoms go away. Even though the Epley maneuver may relieve your vertigo for a few weeks, it is possible that your symptoms will return. This maneuver relieves vertigo, but it does not relieve dizziness. What are the risks? If it is done correctly, the Epley maneuver is considered safe. Sometimes it can lead to dizziness or nausea that goes away after a short time. If you develop other symptoms, such as changes in vision, weakness, or numbness, stop doing the maneuver and call your health care provider. How to perform the Epley maneuver 1. Sit on the edge of a bed or table with your back straight and your legs extended or hanging over the edge of the bed or table. 2. Turn your head halfway toward the affected ear or side. 3. Lie backward quickly with your head turned until you are lying flat on your back. You may want to position a pillow under your shoulders. 4. Hold this position for 30 seconds. You may experience an attack of vertigo. This is normal. 5. Turn your head to the opposite direction until your unaffected ear is facing the floor. 6. Hold this position for 30 seconds. You may experience an attack of vertigo. This is normal. Hold this position until the vertigo stops. 7. Turn your whole body to the same side as your head. Hold for another 30 seconds. 8. Sit back up. You can repeat this exercise up to 3 times a day. Follow these  instructions at home:  After doing the Epley maneuver, you can return to your normal activities.  Ask your health care provider if there is anything you should do at home to prevent vertigo. He or she may recommend that you: ? Keep your head raised (elevated) with two or more pillows while you sleep. ? Do not sleep on the side of your affected ear. ? Get up slowly from bed. ? Avoid sudden movements during the day. ? Avoid extreme head movement, like looking up or bending over. Contact a health care provider if:  Your vertigo gets worse.  You have other symptoms, including: ? Nausea. ? Vomiting. ? Headache. Get help right away if:  You have vision changes.  You have a severe or worsening headache or neck pain.  You cannot stop vomiting.  You have new numbness or weakness in any part of your body. Summary  Vertigo is the feeling that you or your surroundings are moving when they are not.  The Epley maneuver is an exercise that relieves symptoms of vertigo.  If the Epley maneuver is done correctly, it is considered safe. You can do it up to 3 times a day. This information is not intended to replace advice given to you by your health care provider. Make sure you discuss any questions you have with your health care provider. Document Released: 03/26/2013 Document Revised: 02/09/2016 Document Reviewed: 02/09/2016 Elsevier Interactive Patient Education  [employer].  Your family or friends notice any behavioral changes.  You have nausea or vomiting that gets worse.  You have numbness or a "pins and needles" sensation. Get help right away if you:  Have difficulty speaking or moving.  Are always dizzy.  Faint.  Develop severe headaches.  Have weakness in your legs or arms.  Have changes in your hearing or vision.  Develop a stiff neck.  Develop sensitivity to light. Summary  Vertigo is the feeling that you or your surroundings are moving when they are not.  Benign positional vertigo is the most common form of vertigo.  The cause of this condition is not known. It may be caused by a disturbance in an area of the inner ear that helps your brain to sense movement and balance.  Symptoms include loss of balance and falling, feeling that you or your surroundings are moving, nausea and vomiting, and blurred vision.  This condition can be diagnosed based on symptoms, physical exam, and other tests, such as MRI, CT scan, eye movement tests, and hearing tests.  Follow safety instructions as told by your health care provider. You will also be told when to contact your health care provider in case of problems. This information is not intended to replace advice given to you by your health care provider. Make sure you discuss any questions you have with your health care provider. Document Released: 12/27/2005 Document Revised: 08/30/2017 Document Reviewed: 08/30/2017 Elsevier Interactive Patient Education  2019 [employer].

## 2018-08-16 ENCOUNTER — Encounter: Payer: Self-pay | Admitting: Family Medicine

## 2018-08-21 ENCOUNTER — Encounter: Payer: Self-pay | Admitting: Family Medicine

## 2018-08-21 ENCOUNTER — Other Ambulatory Visit: Payer: Self-pay

## 2018-08-21 ENCOUNTER — Ambulatory Visit: Payer: Medicare Other | Admitting: Family Medicine

## 2018-08-21 ENCOUNTER — Telehealth: Payer: Self-pay | Admitting: *Deleted

## 2018-08-21 ENCOUNTER — Ambulatory Visit (INDEPENDENT_AMBULATORY_CARE_PROVIDER_SITE_OTHER): Payer: Medicare Other | Admitting: Family Medicine

## 2018-08-21 VITALS — BP 132/84 | HR 59

## 2018-08-21 DIAGNOSIS — F339 Major depressive disorder, recurrent, unspecified: Secondary | ICD-10-CM | POA: Diagnosis not present

## 2018-08-21 DIAGNOSIS — E785 Hyperlipidemia, unspecified: Secondary | ICD-10-CM

## 2018-08-21 DIAGNOSIS — I10 Essential (primary) hypertension: Secondary | ICD-10-CM

## 2018-08-21 NOTE — Telephone Encounter (Signed)
-----   Message from Lucretia Kern, DO sent at 08/21/2018  3:13 PM EDT ----- -phq9 in epic -follow up with Dr. Maudie Mercury or [facility] with Dr. Jerilee Hoh in 3 months per pt preference -lab visit fasting in next 1-2 weeks.

## 2018-08-21 NOTE — Progress Notes (Signed)
Virtual Visit via Video Note  I connected with Lindsay [name]  on 08/21/18 at  3:00 PM EDT by a video enabled telemedicine application and verified that I am speaking with the correct person using two identifiers.  Location patient: home Location provider:work or home office Persons participating in the virtual visit: patient, provider  I discussed the limitations of evaluation and management by telemedicine and the availability of in person appointments. The patient expressed understanding and agreed to proceed.   HPI:  Lindsay [name] is a pleasant 68 y.o. here for follow up. Chronic medical problems summarized below were reviewed for changes and stability and were updated as needed below. These issues and their treatment remain stable for the most part. Has been watching BP at home with more then half of readings 1 teens-120s/70s. A few higher. Has felt well other then a bout with BPPV that resolved with Epley manuver - saw Dr. Jerilee Hoh. Reports staying busy and depression well controlled despite the covid19 pandemic. She is homeschooling her grandchildren. Denies CP, SOB, DOE, treatment intolerance or new symptoms.  Elevated BP: -she preferred to monitor, finally agreed to treatment in 05/2018 -meds: norvasc 2.5mg   GERD: -uses PPI prn -rare dysphagia, refused GI evaluation in the past,  -hx dilation of esophagus remotely -stable  Migraines: -Uses prn triptan, magnesium  Depression and stress: -Chronic depression, treated with Wellbutrin; sport med rxd trazadone as well -stable  Mild hyperlipidemia: -walking some -Reports a healthy diet -prefers to take niacin and fish oil rather then medication for this  Chronic neck pain: -sees sports medicine for management  ROS: See pertinent positives and negatives per HPI.  Past Medical History:  Diagnosis Date  . COMMON MIGRAINE 09/20/2006  . DEPRESSION 06/11/2009  . GERD 06/11/2009  . Hypertension 04/17/2018  . Neck pain    eval with  sports medicine in 2016/17  . Osteopenia 04/17/2018    Past Surgical History:  Procedure Laterality Date  . ABDOMINAL HYSTERECTOMY    . CHOLECYSTECTOMY    . OVARIAN CYST REMOVAL    . REPAIR EXTENSOR TENDON HAND    . TONSILLECTOMY      Family History  Problem Relation Age of Onset  . Dementia Mother   . Osteoporosis Mother   . Breast cancer Sister   . Heart attack Brother   . Colon cancer Neg Hx   . Esophageal cancer Neg Hx   . Rectal cancer Neg Hx   . Stomach cancer Neg Hx     SOCIAL HX: see hpi   Current Outpatient Medications:  .  amLODipine (NORVASC) 2.5 MG tablet, TAKE 1 TABLET BY MOUTH EVERY DAY, Disp: 90 tablet, Rfl: 0 .  buPROPion (WELLBUTRIN SR) 150 MG 12 hr tablet, Take 1 tablet (150 mg total) by mouth 2 (two) times daily., Disp: 180 tablet, Rfl: 1 .  Cholecalciferol (VITAMIN D3) 1000 UNITS capsule, Take 1,000 Units by mouth daily.  , Disp: , Rfl:  .  clindamycin (CLINDAGEL) 1 % gel, Apply topically 2 (two) times daily., Disp: 30 g, Rfl: 3 .  diphenhydramine-acetaminophen (TYLENOL PM) 25-500 MG TABS, Take 1 tablet by mouth at bedtime as needed., Disp: , Rfl:  .  Docusate Sodium (COLACE PO), Take by mouth., Disp: , Rfl:  .  fish oil-omega-3 fatty acids 1000 MG capsule, Take 1 g by mouth daily., Disp: , Rfl:  .  hydrocortisone (ANUSOL-HC) 2.5 % rectal cream, Place 1 application rectally 2 (two) times daily., Disp: 30 g, Rfl: 0 .  MAGNESIUM PO,  Take by mouth., Disp: , Rfl:  .  Misc Natural Products (TART CHERRY ADVANCED PO), Take by mouth daily., Disp: , Rfl:  .  niacin 250 MG tablet, Take 250 mg by mouth daily with breakfast.  , Disp: , Rfl:  .  pantoprazole (PROTONIX) 40 MG tablet, Take 1 tablet (40 mg total) by mouth daily., Disp: 30 tablet, Rfl: 3 .  SUMAtriptan (IMITREX) 100 MG tablet, TAKE 1/2-1 TABLET BY MOUTH AT ONSET MIGRAINE,MAY REPEAT ONCE 2HOURS IF NEED,MAX 2DOSES IN 24 HOURS, Disp: 9 tablet, Rfl: 3  EXAM:  VITALS per patient if applicable:  GENERAL:  alert, oriented, appears well and in no acute distress  HEENT: atraumatic, conjunttiva clear, no obvious abnormalities on inspection of external nose and ears  NECK: normal movements of the head and neck  LUNGS: on inspection no signs of respiratory distress, breathing rate appears normal, no obvious gross SOB, gasping or wheezing  CV: no obvious cyanosis  MS: moves all visible extremities without noticeable abnormality  PSYCH/NEURO: pleasant and cooperative, no obvious depression or anxiety, speech and thought processing grossly intact  ASSESSMENT AND PLAN:  Discussed the following assessment and plan:  Essential hypertension - Plan: Basic metabolic panel, CBC  Depression, recurrent (HCC)  Hyperlipidemia, unspecified hyperlipidemia type - Plan: Lipid panel  BP borderline but improved. P prefers to try continued monitoring with current medication. If more valuaes above 130/80 she agrees to call and would increase norvasc.  Healthy diet and regular exercise advised. Continue current meds Fasting labs per orders Follow up 3 months    I discussed the assessment and treatment plan with the patient. The patient was provided an opportunity to ask questions and all were answered. The patient agreed with the plan and demonstrated an understanding of the instructions.   Follow up instructions: Advised assistant Wendie Simmer to help patient arrange the following: -phq9 in epic -follow up with Dr. Maudie Mercury or [facility] with Dr. Jerilee Hoh in 3 months per pt preference -lab visit fasting in next 1-2 weeks.   Lucretia Kern, DO

## 2018-08-21 NOTE — Telephone Encounter (Signed)
I called the pt and questions from PHQ-9 entered in the chart.  Patient stated she will call back for an appt with new PCP of her choice and a lab appt was scheduled for 5/26.

## 2018-08-28 ENCOUNTER — Other Ambulatory Visit: Payer: Self-pay

## 2018-08-28 ENCOUNTER — Other Ambulatory Visit (INDEPENDENT_AMBULATORY_CARE_PROVIDER_SITE_OTHER): Payer: Medicare Other

## 2018-08-28 DIAGNOSIS — E785 Hyperlipidemia, unspecified: Secondary | ICD-10-CM

## 2018-08-28 DIAGNOSIS — I10 Essential (primary) hypertension: Secondary | ICD-10-CM

## 2018-08-28 LAB — BASIC METABOLIC PANEL
BUN: 17 mg/dL (ref 6–23)
CO2: 32 mEq/L (ref 19–32)
Calcium: 9.1 mg/dL (ref 8.4–10.5)
Chloride: 104 mEq/L (ref 96–112)
Creatinine, Ser: 0.9 mg/dL (ref 0.40–1.20)
GFR: 62.68 mL/min (ref >60.00)
Glucose, Bld: 83 mg/dL (ref 70–99)
Potassium: 4.4 mEq/L (ref 3.5–5.1)
Sodium: 140 mEq/L (ref 135–145)

## 2018-08-28 LAB — CBC
HCT: 41.7 % (ref 36.0–46.0)
Hemoglobin: 14.4 g/dL (ref 12.0–15.0)
MCHC: 34.6 g/dL (ref 30.0–36.0)
MCV: 95.2 fl (ref 78.0–100.0)
Platelets: 273 10*3/uL (ref 150.0–400.0)
RBC: 4.37 Mil/uL (ref 3.87–5.11)
RDW: 12.8 % (ref 11.5–15.5)
WBC: 3.7 10*3/uL — ABNORMAL LOW (ref 4.0–10.5)

## 2018-08-28 LAB — LIPID PANEL
Cholesterol: 194 mg/dL (ref 0–200)
HDL: 76.5 mg/dL (ref >39.00)
LDL Cholesterol: 103 mg/dL — ABNORMAL HIGH (ref 0–99)
NonHDL: 117.85
Total CHOL/HDL Ratio: 3
Triglycerides: 72 mg/dL (ref 0.0–149.0)
VLDL: 14.4 mg/dL (ref 0.0–40.0)

## 2018-08-29 NOTE — Progress Notes (Signed)
[facility] [address], [name] [address] Phone: [phone] Subjective:   I Lindsay [name] am serving as a Education administrator for Dr. Hulan Saas.    CC: Right hand pain, back and neck pain follow-up  QVZ:[id number]    08/30/2018 Lindsay [name] is a 68 y.o. female coming in with complaint of wrist pain. States she is doing well. Believes the injection was very helpful. Patient did have the injection involving the carpal tunnel.  Patient is doing much better overall.  Feels like this is helped some of the neck pain as well.  Patient is always responds well to manipulation.  Patient states that the radicular symptoms is improved.    Past Medical History:  Diagnosis Date  . COMMON MIGRAINE 09/20/2006  . DEPRESSION 06/11/2009  . GERD 06/11/2009  . Hypertension 04/17/2018  . Neck pain    eval with sports medicine in 2016/17  . Osteopenia 04/17/2018   Past Surgical History:  Procedure Laterality Date  . ABDOMINAL HYSTERECTOMY    . CHOLECYSTECTOMY    . OVARIAN CYST REMOVAL    . REPAIR EXTENSOR TENDON HAND    . TONSILLECTOMY     Social History   Socioeconomic History  . Marital status: Married    Spouse name: Not on file  . Number of children: Not on file  . Years of education: Not on file  . Highest education level: Not on file  Occupational History  . Not on file  Social Needs  . Financial resource strain: Not on file  . Food insecurity:    Worry: Not on file    Inability: Not on file  . Transportation needs:    Medical: Not on file    Non-medical: Not on file  Tobacco Use  . Smoking status: Former Smoker    Types: Cigarettes    Last attempt to quit: 04/04/2004    Years since quitting: 14.4  . Smokeless tobacco: Never Used  Substance and Sexual Activity  . Alcohol use: Yes    Alcohol/week: 0.0 standard drinks    Comment: occ  . Drug use: No  . Sexual activity: Not on file  Lifestyle  . Physical activity:    Days per week: Not on file    Minutes per session: Not on file  . Stress: Not on file  Relationships  . Social connections:    Talks on phone: Not on file    Gets together: Not on file    Attends religious service: Not on file    Active member of club or organization: Not on file    Attends meetings of clubs or organizations: Not on file    Relationship status: Not on file  Other Topics Concern  . Not on file  Social History Narrative  . Not on file   No Known Allergies Family History  Problem Relation Age of Onset  . Dementia Mother   . Osteoporosis Mother   . Breast cancer Sister   . Heart attack Brother   . Colon cancer Neg Hx   . Esophageal cancer Neg Hx   . Rectal cancer Neg Hx   . Stomach cancer Neg Hx      Current Outpatient Medications (Cardiovascular):  .  amLODipine (NORVASC) 2.5 MG tablet, TAKE 1 TABLET BY MOUTH EVERY DAY   Current Outpatient Medications (Analgesics):  Marland Kitchen  SUMAtriptan (IMITREX) 100 MG tablet, TAKE 1/2-1 TABLET BY MOUTH AT ONSET MIGRAINE,MAY REPEAT ONCE 2HOURS IF NEED,MAX 2DOSES IN  24 HOURS   Current Outpatient Medications (Other):  Marland Kitchen  buPROPion (WELLBUTRIN SR) 150 MG 12 hr tablet, Take 1 tablet (150 mg total) by mouth 2 (two) times daily. .  Cholecalciferol (VITAMIN D3) 1000 UNITS capsule, Take 1,000 Units by mouth daily.   .  clindamycin (CLINDAGEL) 1 % gel, Apply topically 2 (two) times daily. .  diphenhydramine-acetaminophen (TYLENOL PM) 25-500 MG TABS, Take 1 tablet by mouth at bedtime as needed. .  Docusate Sodium (COLACE PO), Take by mouth. .  fish oil-omega-3 fatty acids 1000 MG capsule, Take 1 g by mouth daily. .  hydrocortisone (ANUSOL-HC) 2.5 % rectal cream, Place 1 application rectally 2 (two) times daily. Marland Kitchen  MAGNESIUM PO, Take by mouth. .  Misc Natural Products (TART CHERRY ADVANCED PO), Take by mouth daily. .  niacin 250 MG tablet, Take 250 mg by mouth daily with breakfast.   .  pantoprazole (PROTONIX) 40 MG tablet, Take 1 tablet (40 mg total) by mouth  daily.    Past medical history, social, surgical and family history all reviewed in electronic medical record.  No pertanent information unless stated regarding to the chief complaint.   Review of Systems:  No headache, visual changes, nausea, vomiting, diarrhea, constipation, dizziness, abdominal pain, skin rash, fevers, chills, night sweats, weight loss, swollen lymph nodes, body aches, joint swelling, muscle aches, chest pain, shortness of breath, mood changes.   Objective  There were no vitals taken for this visit. Systems examined below as of    General: No apparent distress alert and oriented x3 mood and affect normal, dressed appropriately.  HEENT: Pupils equal, extraocular movements intact  Respiratory: Patient's speak in full sentences and does not appear short of breath  Cardiovascular: No lower extremity edema, non tender, no erythema  Skin: Warm dry intact with no signs of infection or rash on extremities or on axial skeleton.  Abdomen: Soft nontender  Neuro: Cranial nerves II through XII are intact, neurovascularly intact in all extremities with 2+ DTRs and 2+ pulses.  Lymph: No lymphadenopathy of posterior or anterior cervical chain or axillae bilaterally.  Gait normal with good balance and coordination.  MSK:  Non tender with full range of motion and good stability and symmetric strength and tone of shoulders, elbows, hip, knee and ankles bilaterally.  Mild arthritic changes in multiple joints   Right wrist exam still shows some mild discomfort with Tinel's.  Otherwise fairly unremarkable.  Mild arthritic changes of the hands bilaterally. Neck exam shows the patient does have some loss of lordosis.  Tightness in the trapezius bilaterally.  Negative Spurling's.  5 out of 5 strength of the upper extremities.  Osteopathic findings  C4 flexed rotated and side bent left C6 flexed rotated and side bent left T3 extended rotated and side bent right inhaled third rib T6 extended  rotated and side bent left L2 flexed rotated and side bent right Sacrum right on right     Impression and Recommendations:     This case required medical decision making of moderate complexity. The above documentation has been reviewed and is accurate and complete Lindsay Pulley, DO       Note: This dictation was prepared with Dragon dictation along with smaller phrase technology. Any transcriptional errors that result from this process are unintentional.

## 2018-08-30 ENCOUNTER — Other Ambulatory Visit: Payer: Self-pay

## 2018-08-30 ENCOUNTER — Ambulatory Visit (INDEPENDENT_AMBULATORY_CARE_PROVIDER_SITE_OTHER): Payer: Medicare Other | Admitting: Family Medicine

## 2018-08-30 ENCOUNTER — Encounter: Payer: Self-pay | Admitting: Family Medicine

## 2018-08-30 VITALS — BP 110/70 | HR 59 | Ht 65.0 in | Wt 133.0 lb

## 2018-08-30 DIAGNOSIS — G5601 Carpal tunnel syndrome, right upper limb: Secondary | ICD-10-CM

## 2018-08-30 DIAGNOSIS — M5030 Other cervical disc degeneration, unspecified cervical region: Secondary | ICD-10-CM

## 2018-08-30 DIAGNOSIS — M999 Biomechanical lesion, unspecified: Secondary | ICD-10-CM | POA: Diagnosis not present

## 2018-08-30 NOTE — Assessment & Plan Note (Signed)
Decision today to treat with OMT was based on Physical Exam  After verbal consent patient was treated with HVLA, ME, FPR techniques in cervical, thoracic, rib areas  Patient tolerated the procedure well with improvement in symptoms  Patient given exercises, stretches and lifestyle modifications  See medications in patient instructions if given  Patient will follow up in 4-8 weeks 

## 2018-08-30 NOTE — Assessment & Plan Note (Signed)
Significant improvement after the injection.  Discussed icing regimen and home exercises.  Discussed continuing bracing at night.  Can repeat injections every 6 to 12 months if needed.  Patient wants to avoid surgical intervention.  Follow-up again in 4 to 6 weeks

## 2018-08-30 NOTE — Assessment & Plan Note (Signed)
Stable overall.  Likely contribute to some of the radicular symptoms but as we can see with patient responding well to the injection likely, more at the carpal tunnel.  Patient will try to do the home exercises on a more regular basis.  Discussed posture and ergonomics.  Discussed proper lifting techniques as well to try to avoid the neck pain.  Follow-up again in 4 to 8 weeks

## 2018-09-20 ENCOUNTER — Other Ambulatory Visit: Payer: Self-pay

## 2018-09-20 ENCOUNTER — Ambulatory Visit (INDEPENDENT_AMBULATORY_CARE_PROVIDER_SITE_OTHER): Payer: Medicare Other | Admitting: Internal Medicine

## 2018-09-20 DIAGNOSIS — R928 Other abnormal and inconclusive findings on diagnostic imaging of breast: Secondary | ICD-10-CM | POA: Diagnosis not present

## 2018-09-20 DIAGNOSIS — K219 Gastro-esophageal reflux disease without esophagitis: Secondary | ICD-10-CM | POA: Diagnosis not present

## 2018-09-20 DIAGNOSIS — G43009 Migraine without aura, not intractable, without status migrainosus: Secondary | ICD-10-CM

## 2018-09-20 DIAGNOSIS — I10 Essential (primary) hypertension: Secondary | ICD-10-CM | POA: Diagnosis not present

## 2018-09-20 DIAGNOSIS — G5601 Carpal tunnel syndrome, right upper limb: Secondary | ICD-10-CM | POA: Diagnosis not present

## 2018-09-20 DIAGNOSIS — M5030 Other cervical disc degeneration, unspecified cervical region: Secondary | ICD-10-CM

## 2018-09-20 DIAGNOSIS — F418 Other specified anxiety disorders: Secondary | ICD-10-CM | POA: Diagnosis not present

## 2018-09-20 NOTE — Progress Notes (Signed)
Virtual Visit via Telephone Note  I connected with Lindsay [name] on 09/20/18 at 10:00 AM EDT by telephone and verified that I am speaking with the correct person using two identifiers.   I discussed the limitations, risks, security and privacy concerns of performing an evaluation and management service by telephone and the availability of in person appointments. I also discussed with the patient that there may be a patient responsible charge related to this service. The patient expressed understanding and agreed to proceed.  We initially attempted to connect via video chat but were unable to due to technical difficulties on the patient's end, so we converted this visit to a phone visit.  Location patient: home Location provider: work office Participants present for the call: patient, provider Patient did not have a visit in the prior 7 days to address this/these issue(s).   History of Present Illness:  This is a scheduled visit to establish care and to discuss chronic medical conditions.  Lindsay [name] is now retired, she is helping her daughter homeschooling her 38-year-old grandson and also helping take care of a new granddaughter.  She is married she has 1 son with 3 children and a daughter with 2 children.  She is a former smoker who quit in 2006, she drinks a glass of wine about every evening.  Her family history significant for a sister with breast cancer, mother with presumed hyperlipidemia and coronary artery disease.  A brother who had an MI, and a father who had a CVA in his 6s.  Her past medical history significant for:  1.  Migraine headaches that have gotten better over the years, she uses Imitrex as needed at the onset of the headache.  2.  Hypertension on low-dose Norvasc 2.5 mg that was just started this year in February.  3.  GERD she takes Protonix as needed.  4.  Depression/anxiety has been well controlled on Wellbutrin for years.  5.  Cervical arthritis for which she gets  occasional epidural injections, last one was 2 years ago.  6.  Carpal tunnel syndrome, has seen sports medicine and has had several cortisone injections that have provided significant relief.   Observations/Objective: Patient sounds cheerful and well on the phone. I do not appreciate any increased work of breathing. Speech and thought processing are grossly intact. Patient reported vitals: None reported   Current Outpatient Medications:  .  amLODipine (NORVASC) 2.5 MG tablet, TAKE 1 TABLET BY MOUTH EVERY DAY, Disp: 90 tablet, Rfl: 0 .  buPROPion (WELLBUTRIN SR) 150 MG 12 hr tablet, Take 1 tablet (150 mg total) by mouth 2 (two) times daily., Disp: 180 tablet, Rfl: 1 .  Cholecalciferol (VITAMIN D3) 1000 UNITS capsule, Take 1,000 Units by mouth daily.  , Disp: , Rfl:  .  clindamycin (CLINDAGEL) 1 % gel, Apply topically 2 (two) times daily., Disp: 30 g, Rfl: 3 .  diphenhydramine-acetaminophen (TYLENOL PM) 25-500 MG TABS, Take 1 tablet by mouth at bedtime as needed., Disp: , Rfl:  .  Docusate Sodium (COLACE PO), Take by mouth., Disp: , Rfl:  .  fish oil-omega-3 fatty acids 1000 MG capsule, Take 1 g by mouth daily., Disp: , Rfl:  .  hydrocortisone (ANUSOL-HC) 2.5 % rectal cream, Place 1 application rectally 2 (two) times daily., Disp: 30 g, Rfl: 0 .  MAGNESIUM PO, Take by mouth., Disp: , Rfl:  .  Misc Natural Products (TART CHERRY ADVANCED PO), Take by mouth daily., Disp: , Rfl:  .  niacin 250  MG tablet, Take 250 mg by mouth daily with breakfast.  , Disp: , Rfl:  .  pantoprazole (PROTONIX) 40 MG tablet, Take 1 tablet (40 mg total) by mouth daily., Disp: 30 tablet, Rfl: 3 .  SUMAtriptan (IMITREX) 100 MG tablet, TAKE 1/2-1 TABLET BY MOUTH AT ONSET MIGRAINE,MAY REPEAT ONCE 2HOURS IF NEED,MAX 2DOSES IN 24 HOURS, Disp: 9 tablet, Rfl: 3  Review of Systems:  Constitutional: Denies fever, chills, diaphoresis, appetite change and fatigue.  HEENT: Denies photophobia, eye pain, redness, hearing loss, ear  pain, congestion, sore throat, rhinorrhea, sneezing, mouth sores, trouble swallowing, neck pain, neck stiffness and tinnitus.   Respiratory: Denies SOB, DOE, cough, chest tightness,  and wheezing.   Cardiovascular: Denies chest pain, palpitations and leg swelling.  Gastrointestinal: Denies nausea, vomiting, abdominal pain, diarrhea, constipation, blood in stool and abdominal distention.  Genitourinary: Denies dysuria, urgency, frequency, hematuria, flank pain and difficulty urinating.  Endocrine: Denies: hot or cold intolerance, sweats, changes in hair or nails, polyuria, polydipsia. Musculoskeletal: Denies myalgias, back pain, joint swelling, arthralgias and gait problem.  Skin: Denies pallor, rash and wound.  Neurological: Denies dizziness, seizures, syncope, weakness, light-headedness, numbness and headaches.  Hematological: Denies adenopathy. Easy bruising, personal or family bleeding history  Psychiatric/Behavioral: Denies suicidal ideation, mood changes, confusion, nervousness, sleep disturbance and agitation   Assessment and Plan:  Migraine without aura and without status migrainosus, not intractable -Currently not a major issue, uses Imitrex as needed.  Gastroesophageal reflux disease, esophagitis presence not specified -On as needed PPI therapy.  Degenerative cervical disc -With occasional numbness of the arms, has gotten epidural injections of the neck in the past with significant relief.  Essential hypertension -Appears well-controlled on low-dose Norvasc.  Abnormal mammogram -She has yearly mammograms as she had a sister with breast cancer and has had callback mammograms at least 3 times throughout her years.  Right carpal tunnel syndrome -Follow-up with sports medicine as needed.  Depression with anxiety -Mood is stable on Wellbutrin.    I discussed the assessment and treatment plan with the patient. The patient was provided an opportunity to ask questions and all  were answered. The patient agreed with the plan and demonstrated an understanding of the instructions.   The patient was advised to call back or seek an in-person evaluation if the symptoms worsen or if the condition fails to improve as anticipated.  I provided 25 minutes of non-face-to-face time during this encounter.   Lelon Frohlich, MD [facility]

## 2018-09-25 DIAGNOSIS — H3561 Retinal hemorrhage, right eye: Secondary | ICD-10-CM | POA: Diagnosis not present

## 2018-09-26 NOTE — Assessment & Plan Note (Signed)
Decision today to treat with OMT was based on Physical Exam  After verbal consent patient was treated with HVLA, ME, FPR techniques in cervical, thoracic, rib l areas  Patient tolerated the procedure well with improvement in symptoms  Patient given exercises, stretches and lifestyle modifications  See medications in patient instructions if given  Patient will follow up in 4-8 weeks

## 2018-09-26 NOTE — Progress Notes (Signed)
[name] [place], [place] [address] Phone: [phone] Subjective:      CC: Neck pain follow-up  I, Thalia Bloodgood, PT, LAT, ATC am serving as scribe for Dr. Hulan Saas.  GBT:[id number]    08/30/18: Stable overall.  Likely contribute to some of the radicular symptoms but as we can see with patient responding well to the injection likely, more at the carpal tunnel.  Patient will try to do the home exercises on a more regular basis.  Discussed posture and ergonomics.  Discussed proper lifting techniques as well to try to avoid the neck pain.  Follow-up again in 4 to 8 weeks  Update: 09/27/18  Lindsay [name] is a 68 y.o. female coming in with complaint of of R hand/wrist pain and neck pain.  Pt states that she has had some numbness/tingling return in her R fingers 1-3.  She reports that she has been having more cracking/popping in her neck.  Pt states that she has not been doing her HEP as discussed previously.      Past Medical History:  Diagnosis Date  . COMMON MIGRAINE 09/20/2006  . DEPRESSION 06/11/2009  . GERD 06/11/2009  . Hypertension 04/17/2018  . Neck pain    eval with sports medicine in 2016/17  . Osteopenia 04/17/2018   Past Surgical History:  Procedure Laterality Date  . ABDOMINAL HYSTERECTOMY    . CHOLECYSTECTOMY    . OVARIAN CYST REMOVAL    . REPAIR EXTENSOR TENDON HAND    . TONSILLECTOMY     Social History   Socioeconomic History  . Marital status: Married    Spouse name: Not on file  . Number of children: Not on file  . Years of education: Not on file  . Highest education level: Not on file  Occupational History  . Not on file  Social Needs  . Financial resource strain: Not on file  . Food insecurity    Worry: Not on file    Inability: Not on file  . Transportation needs    Medical: Not on file    Non-medical: Not on file  Tobacco Use  . Smoking status: Former Smoker    Types: Cigarettes    Quit date:  04/04/2004    Years since quitting: 14.4  . Smokeless tobacco: Never Used  Substance and Sexual Activity  . Alcohol use: Yes    Alcohol/week: 0.0 standard drinks    Comment: occ  . Drug use: No  . Sexual activity: Not on file  Lifestyle  . Physical activity    Days per week: Not on file    Minutes per session: Not on file  . Stress: Not on file  Relationships  . Social Herbalist on phone: Not on file    Gets together: Not on file    Attends religious service: Not on file    Active member of club or organization: Not on file    Attends meetings of clubs or organizations: Not on file    Relationship status: Not on file  Other Topics Concern  . Not on file  Social History Narrative  . Not on file   No Known Allergies Family History  Problem Relation Age of Onset  . Dementia Mother   . Osteoporosis Mother   . Breast cancer Sister   . Heart attack Brother   . Colon cancer Neg Hx   . Esophageal cancer Neg Hx   .  Rectal cancer Neg Hx   . Stomach cancer Neg Hx      Current Outpatient Medications (Cardiovascular):  .  amLODipine (NORVASC) 2.5 MG tablet, TAKE 1 TABLET BY MOUTH EVERY DAY   Current Outpatient Medications (Analgesics):  Marland Kitchen  SUMAtriptan (IMITREX) 100 MG tablet, TAKE 1/2-1 TABLET BY MOUTH AT ONSET MIGRAINE,MAY REPEAT ONCE 2HOURS IF NEED,MAX 2DOSES IN 24 HOURS   Current Outpatient Medications (Other):  Marland Kitchen  buPROPion (WELLBUTRIN SR) 150 MG 12 hr tablet, Take 1 tablet (150 mg total) by mouth 2 (two) times daily. .  Cholecalciferol (VITAMIN D3) 1000 UNITS capsule, Take 1,000 Units by mouth daily.   .  clindamycin (CLINDAGEL) 1 % gel, Apply topically 2 (two) times daily. .  diphenhydramine-acetaminophen (TYLENOL PM) 25-500 MG TABS, Take 1 tablet by mouth at bedtime as needed. .  Docusate Sodium (COLACE PO), Take by mouth. .  fish oil-omega-3 fatty acids 1000 MG capsule, Take 1 g by mouth daily. .  hydrocortisone (ANUSOL-HC) 2.5 % rectal cream, Place 1  application rectally 2 (two) times daily. Marland Kitchen  MAGNESIUM PO, Take by mouth. .  Misc Natural Products (TART CHERRY ADVANCED PO), Take by mouth daily. .  niacin 250 MG tablet, Take 250 mg by mouth daily with breakfast.   .  pantoprazole (PROTONIX) 40 MG tablet, Take 1 tablet (40 mg total) by mouth daily.    Past medical history, social, surgical and family history all reviewed in electronic medical record.  No pertanent information unless stated regarding to the chief complaint.   Review of Systems:  No headache, visual changes, nausea, vomiting, diarrhea, constipation,  abdominal pain, skin rash, fevers, chills, night sweats, weight loss, swollen lymph nodes, body aches, joint swelling, mild positive muscle aches mild dizziness chest pain, shortness of breath, mood changes.   Objective  Blood pressure 130/78, pulse 64, height 5\' 5"  (1.651 m), weight 132 lb 9.6 oz (60.1 kg), SpO2 99 %.   General: No apparent distress alert and oriented x3 mood and affect normal, dressed appropriately.  HEENT: Pupils equal, extraocular movements intact  Respiratory: Patient's speak in full sentences and does not appear short of breath  Cardiovascular: No lower extremity edema, non tender, no erythema  Skin: Warm dry intact with no signs of infection or rash on extremities or on axial skeleton.  Abdomen: Soft nontender  Neuro: Cranial nerves II through XII are intact, neurovascularly intact in all extremities with 2+ DTRs and 2+ pulses.  Lymph: No lymphadenopathy of posterior or anterior cervical chain or axillae bilaterally.  Gait normal with good balance and coordination.  MSK:  Non tender with full range of motion and good stability and symmetric strength and tone of shoulders, elbows, wrist, hip, knee and ankles bilaterally.  Neck: Inspection loss of lordosis. No palpable stepoffs. Negative Spurling's maneuver. Limited range of motion in all planes of 5 to 10 degrees. Grip strength and sensation normal  in bilateral hands Strength good C4 to T1 distribution No sensory change to C4 to T1 Negative Hoffman sign bilaterally Reflexes normal Tightness of the trapezius bilaterally  Osteopathic findings  C6 flexed rotated and side bent left T3 extended rotated and side bent right inhaled third rib    Impression and Recommendations:     This case required medical decision making of moderate complexity. The above documentation has been reviewed and is accurate and complete [name], DO       Note: This dictation was prepared with Dragon dictation along with smaller phrase technology. Any  transcriptional errors that result from this process are unintentional.

## 2018-09-26 NOTE — Assessment & Plan Note (Signed)
Degenerative disc disease of the cervical spine.  Discussed icing regimen and home exercises.  Discussed which activities of doing which wants to avoid.  Follow-up again in 4 to 8 weeks

## 2018-09-27 ENCOUNTER — Ambulatory Visit (INDEPENDENT_AMBULATORY_CARE_PROVIDER_SITE_OTHER): Payer: Medicare Other | Admitting: Family Medicine

## 2018-09-27 ENCOUNTER — Encounter: Payer: Self-pay | Admitting: Family Medicine

## 2018-09-27 ENCOUNTER — Other Ambulatory Visit: Payer: Self-pay

## 2018-09-27 VITALS — BP 130/78 | HR 64 | Ht 65.0 in | Wt 132.6 lb

## 2018-09-27 DIAGNOSIS — M999 Biomechanical lesion, unspecified: Secondary | ICD-10-CM | POA: Diagnosis not present

## 2018-09-27 DIAGNOSIS — M5030 Other cervical disc degeneration, unspecified cervical region: Secondary | ICD-10-CM

## 2018-09-27 NOTE — Patient Instructions (Signed)
See me again in 6 weeks If more dizziness occurs call me [place]

## 2018-10-11 ENCOUNTER — Other Ambulatory Visit: Payer: Self-pay | Admitting: Family Medicine

## 2018-10-13 ENCOUNTER — Encounter: Payer: Self-pay | Admitting: Internal Medicine

## 2018-10-13 ENCOUNTER — Encounter: Payer: Self-pay | Admitting: Family Medicine

## 2018-10-15 ENCOUNTER — Telehealth: Payer: Self-pay | Admitting: Internal Medicine

## 2018-10-15 NOTE — Telephone Encounter (Signed)
Copied from [place] [id number]. Topic: Quick Communication - Rx Refill/Question >> Oct 15, 2018  2:26 PM Leward Quan A wrote: Medication: amLODipine (NORVASC) 2.5 MG tablet   Patient called to say that she have 1 tablet left please refill  Has the patient contacted their pharmacy? Yes.   (Agent: If no, request that the patient contact the pharmacy for the refill.) (Agent: If yes, when and what did the pharmacy advise?)  Preferred Pharmacy (with phone number or street name): [id number] - [place], [place] [phone] (Phone) [phone] (Fax)    Agent: Please be advised that RX refills may take up to 3 business days. We ask that you follow-up with your pharmacy.

## 2018-10-16 MED ORDER — AMLODIPINE BESYLATE 2.5 MG PO TABS: 2.5000 mg | ORAL_TABLET | Freq: Every day | ORAL | 1 refills | Status: DC

## 2018-10-30 ENCOUNTER — Encounter: Payer: Self-pay | Admitting: Family Medicine

## 2018-10-30 ENCOUNTER — Ambulatory Visit (INDEPENDENT_AMBULATORY_CARE_PROVIDER_SITE_OTHER): Payer: Medicare Other | Admitting: Family Medicine

## 2018-10-30 ENCOUNTER — Other Ambulatory Visit: Payer: Self-pay

## 2018-10-30 VITALS — BP 100/72 | HR 65 | Ht 65.0 in | Wt 133.0 lb

## 2018-10-30 DIAGNOSIS — M5030 Other cervical disc degeneration, unspecified cervical region: Secondary | ICD-10-CM | POA: Diagnosis not present

## 2018-10-30 DIAGNOSIS — M999 Biomechanical lesion, unspecified: Secondary | ICD-10-CM | POA: Diagnosis not present

## 2018-10-30 NOTE — Progress Notes (Signed)
[name] [place], [place] [address] Phone: [phone] Subjective:   [name], am serving as a scribe for Dr. Hulan Saas.   CC: Neck pain and hand pain follow-up  [id number]  Lindsay [name] is a 68 y.o. female coming in with complaint of back and neck pain. Patient last seen on 09/27/2018. Patient states that her neck is feeling fine but is having continued numbness in both hands R>L. Still having same symptoms patient still does not want to do the nerve conduction study.  Continues to have pain in the neck a little bit.  Does respond well to manipulation     Past Medical History:  Diagnosis Date  . COMMON MIGRAINE 09/20/2006  . DEPRESSION 06/11/2009  . GERD 06/11/2009  . Hypertension 04/17/2018  . Neck pain    eval with sports medicine in 2016/17  . Osteopenia 04/17/2018   Past Surgical History:  Procedure Laterality Date  . ABDOMINAL HYSTERECTOMY    . CHOLECYSTECTOMY    . OVARIAN CYST REMOVAL    . REPAIR EXTENSOR TENDON HAND    . TONSILLECTOMY     Social History   Socioeconomic History  . Marital status: Married    Spouse name: Not on file  . Number of children: Not on file  . Years of education: Not on file  . Highest education level: Not on file  Occupational History  . Not on file  Social Needs  . Financial resource strain: Not on file  . Food insecurity    Worry: Not on file    Inability: Not on file  . Transportation needs    Medical: Not on file    Non-medical: Not on file  Tobacco Use  . Smoking status: Former Smoker    Types: Cigarettes    Quit date: 04/04/2004    Years since quitting: 14.5  . Smokeless tobacco: Never Used  Substance and Sexual Activity  . Alcohol use: Yes    Alcohol/week: 0.0 standard drinks    Comment: occ  . Drug use: No  . Sexual activity: Not on file  Lifestyle  . Physical activity    Days per week: Not on file    Minutes per session: Not on file  . Stress: Not on file   Relationships  . Social Herbalist on phone: Not on file    Gets together: Not on file    Attends religious service: Not on file    Active member of club or organization: Not on file    Attends meetings of clubs or organizations: Not on file    Relationship status: Not on file  Other Topics Concern  . Not on file  Social History Narrative  . Not on file   No Known Allergies Family History  Problem Relation Age of Onset  . Dementia Mother   . Osteoporosis Mother   . Breast cancer Sister   . Heart attack Brother   . Colon cancer Neg Hx   . Esophageal cancer Neg Hx   . Rectal cancer Neg Hx   . Stomach cancer Neg Hx      Current Outpatient Medications (Cardiovascular):  .  amLODipine (NORVASC) 2.5 MG tablet, Take 1 tablet (2.5 mg total) by mouth daily.   Current Outpatient Medications (Analgesics):  Marland Kitchen  SUMAtriptan (IMITREX) 100 MG tablet, TAKE 1/2-1 TABLET BY MOUTH AT ONSET MIGRAINE,MAY REPEAT ONCE 2HOURS IF NEED,MAX 2DOSES IN 24 HOURS   Current  Outpatient Medications (Other):  Marland Kitchen  buPROPion (WELLBUTRIN SR) 150 MG 12 hr tablet, Take 1 tablet (150 mg total) by mouth 2 (two) times daily. .  Cholecalciferol (VITAMIN D3) 1000 UNITS capsule, Take 1,000 Units by mouth daily.   .  clindamycin (CLINDAGEL) 1 % gel, Apply topically 2 (two) times daily. .  diphenhydramine-acetaminophen (TYLENOL PM) 25-500 MG TABS, Take 1 tablet by mouth at bedtime as needed. .  Docusate Sodium (COLACE PO), Take by mouth. .  fish oil-omega-3 fatty acids 1000 MG capsule, Take 1 g by mouth daily. .  hydrocortisone (ANUSOL-HC) 2.5 % rectal cream, Place 1 application rectally 2 (two) times daily. Marland Kitchen  MAGNESIUM PO, Take by mouth. .  Misc Natural Products (TART CHERRY ADVANCED PO), Take by mouth daily. .  niacin 250 MG tablet, Take 250 mg by mouth daily with breakfast.   .  pantoprazole (PROTONIX) 40 MG tablet, Take 1 tablet (40 mg total) by mouth daily.    Past medical history, social, surgical  and family history all reviewed in electronic medical record.  No pertanent information unless stated regarding to the chief complaint.   Review of Systems:  No headache, visual changes, nausea, vomiting, diarrhea, constipation, dizziness, abdominal pain, skin rash, fevers, chills, night sweats, weight loss, swollen lymph nodes, body aches, joint swelling,  chest pain, shortness of breath, mood changes.  Positive muscle aches  Objective  Blood pressure 100/72, pulse 65, height 5\' 5"  (1.651 m), weight 133 lb (60.3 kg), SpO2 99 %.    General: No apparent distress alert and oriented x3 mood and affect normal, dressed appropriately.  HEENT: Pupils equal, extraocular movements intact  Respiratory: Patient's speak in full sentences and does not appear short of breath  Cardiovascular: No lower extremity edema, non tender, no erythema  Skin: Warm dry intact with no signs of infection or rash on extremities or on axial skeleton.  Abdomen: Soft nontender  Neuro: Cranial nerves II through XII are intact, neurovascularly intact in all extremities with 2+ DTRs and 2+ pulses.  Lymph: No lymphadenopathy of posterior or anterior cervical chain or axillae bilaterally.  Gait normal with good balance and coordination.  MSK:  Non tender with full range of motion and good stability and symmetric strength and tone of shoulders, elbows, hip, knee and ankles bilaterally.  Bilateral wrist exams does show positive Tinel sign right greater than left Patient is married and does have some loss of lordosis.  Some crepitus noted.  Patient does have a negative Spurling's today though.  Patient has tenderness there with the crepitus.  Osteopathic findings C6 flexed rotated and side bent left T3 extended rotated and side bent right inhaled third rib T7 extended rotated and side bent left     Impression and Recommendations:     This case required medical decision making of moderate complexity. The above documentation  has been reviewed and is accurate and complete [name], DO       Note: This dictation was prepared with Dragon dictation along with smaller phrase technology. Any transcriptional errors that result from this process are unintentional.

## 2018-10-30 NOTE — Assessment & Plan Note (Signed)
Known degenerative disc disease.  Has responded well to 1 epidural.  Patient wants to avoid that at the moment.  Discussed which activities of doing which wants to avoid.  Increase activity as tolerated.  Follow-up again in 4 to 6 weeks

## 2018-10-30 NOTE — Assessment & Plan Note (Signed)
Decision today to treat with OMT was based on Physical Exam  After verbal consent patient was treated with HVLA, ME, FPR techniques in cervical, thoracic, rib areas  Patient tolerated the procedure well with improvement in symptoms  Patient given exercises, stretches and lifestyle modifications  See medications in patient instructions if given  Patient will follow up in 4-8 weeks 

## 2018-10-30 NOTE — Patient Instructions (Signed)
See me again in  

## 2018-11-15 DIAGNOSIS — H35372 Puckering of macula, left eye: Secondary | ICD-10-CM | POA: Diagnosis not present

## 2018-11-15 DIAGNOSIS — H35462 Secondary vitreoretinal degeneration, left eye: Secondary | ICD-10-CM | POA: Diagnosis not present

## 2018-11-15 DIAGNOSIS — H35033 Hypertensive retinopathy, bilateral: Secondary | ICD-10-CM | POA: Diagnosis not present

## 2018-11-15 DIAGNOSIS — H43813 Vitreous degeneration, bilateral: Secondary | ICD-10-CM | POA: Diagnosis not present

## 2018-11-24 ENCOUNTER — Other Ambulatory Visit: Payer: Self-pay | Admitting: Family Medicine

## 2018-12-04 ENCOUNTER — Ambulatory Visit (INDEPENDENT_AMBULATORY_CARE_PROVIDER_SITE_OTHER): Payer: Medicare Other | Admitting: Family Medicine

## 2018-12-04 ENCOUNTER — Encounter: Payer: Self-pay | Admitting: Family Medicine

## 2018-12-04 ENCOUNTER — Other Ambulatory Visit: Payer: Self-pay

## 2018-12-04 VITALS — BP 124/82 | HR 67 | Ht 65.0 in | Wt 133.0 lb

## 2018-12-04 DIAGNOSIS — M999 Biomechanical lesion, unspecified: Secondary | ICD-10-CM | POA: Diagnosis not present

## 2018-12-04 DIAGNOSIS — M5030 Other cervical disc degeneration, unspecified cervical region: Secondary | ICD-10-CM | POA: Diagnosis not present

## 2018-12-04 DIAGNOSIS — G5601 Carpal tunnel syndrome, right upper limb: Secondary | ICD-10-CM | POA: Diagnosis not present

## 2018-12-04 NOTE — Assessment & Plan Note (Signed)
Patient is been stable for some time.  Some mild radicular symptoms which could also be secondary to the carpal tunnel.  Patient will try more bracing of the wrist at night.  Discussed with patient to stay active.  Follow-up again in 4 to 8 weeks

## 2018-12-04 NOTE — Assessment & Plan Note (Signed)
Bracing at night, will consider another injection, discussed PRP.  Follow-up 4 to 6 weeks may need EMG

## 2018-12-04 NOTE — Progress Notes (Signed)
[name] [address] [place], [place] [address] Phone: [phone] Subjective:   I [name] am serving as a Education administrator for Dr. Hulan Saas.  I  CC: Back and neck pain follow-up  [id number]  Lindsay [name] is a 67 y.o. female coming in with complaint of back pain. States she is doing well. Has been doing ADLs lately. Patient has been doing more lifting around the house.  Noticing more tenderness in the wrist bilaterally right greater than left.  Sometimes associated with numbness in the hands.  Has been doing more exercises as well.  Feels like this could be contributing.  Still not stopping her from any activities of activity very difficult.  Patient has had no increase in neck pain since last injections.     Past Medical History:  Diagnosis Date  . COMMON MIGRAINE 09/20/2006  . DEPRESSION 06/11/2009  . GERD 06/11/2009  . Hypertension 04/17/2018  . Neck pain    eval with sports medicine in 2016/17  . Osteopenia 04/17/2018   Past Surgical History:  Procedure Laterality Date  . ABDOMINAL HYSTERECTOMY    . CHOLECYSTECTOMY    . OVARIAN CYST REMOVAL    . REPAIR EXTENSOR TENDON HAND    . TONSILLECTOMY     Social History   Socioeconomic History  . Marital status: Married    Spouse name: Not on file  . Number of children: Not on file  . Years of education: Not on file  . Highest education level: Not on file  Occupational History  . Not on file  Social Needs  . Financial resource strain: Not on file  . Food insecurity    Worry: Not on file    Inability: Not on file  . Transportation needs    Medical: Not on file    Non-medical: Not on file  Tobacco Use  . Smoking status: Former Smoker    Types: Cigarettes    Quit date: 04/04/2004    Years since quitting: 14.6  . Smokeless tobacco: Never Used  Substance and Sexual Activity  . Alcohol use: Yes    Alcohol/week: 0.0 standard drinks    Comment: occ  . Drug use: No  . Sexual activity:  Not on file  Lifestyle  . Physical activity    Days per week: Not on file    Minutes per session: Not on file  . Stress: Not on file  Relationships  . Social Herbalist on phone: Not on file    Gets together: Not on file    Attends religious service: Not on file    Active member of club or organization: Not on file    Attends meetings of clubs or organizations: Not on file    Relationship status: Not on file  Other Topics Concern  . Not on file  Social History Narrative  . Not on file   No Known Allergies Family History  Problem Relation Age of Onset  . Dementia Mother   . Osteoporosis Mother   . Breast cancer Sister   . Heart attack Brother   . Colon cancer Neg Hx   . Esophageal cancer Neg Hx   . Rectal cancer Neg Hx   . Stomach cancer Neg Hx      Current Outpatient Medications (Cardiovascular):  .  amLODipine (NORVASC) 2.5 MG tablet, Take 1 tablet (2.5 mg total) by mouth daily.   Current Outpatient Medications (Analgesics):  Marland Kitchen  SUMAtriptan (IMITREX) 100  MG tablet, TAKE 1/2-1 TABLET BY MOUTH AT ONSET MIGRAINE,MAY REPEAT ONCE 2HOURS IF NEED,MAX 2DOSES IN 24 HOURS   Current Outpatient Medications (Other):  Marland Kitchen  buPROPion (WELLBUTRIN SR) 150 MG 12 hr tablet, TAKE 1 TABLET BY MOUTH TWICE A DAY .  Cholecalciferol (VITAMIN D3) 1000 UNITS capsule, Take 1,000 Units by mouth daily.   .  clindamycin (CLINDAGEL) 1 % gel, Apply topically 2 (two) times daily. .  diphenhydramine-acetaminophen (TYLENOL PM) 25-500 MG TABS, Take 1 tablet by mouth at bedtime as needed. .  Docusate Sodium (COLACE PO), Take by mouth. .  fish oil-omega-3 fatty acids 1000 MG capsule, Take 1 g by mouth daily. .  hydrocortisone (ANUSOL-HC) 2.5 % rectal cream, Place 1 application rectally 2 (two) times daily. Marland Kitchen  MAGNESIUM PO, Take by mouth. .  Misc Natural Products (TART CHERRY ADVANCED PO), Take by mouth daily. .  niacin 250 MG tablet, Take 250 mg by mouth daily with breakfast.   .  pantoprazole  (PROTONIX) 40 MG tablet, Take 1 tablet (40 mg total) by mouth daily.    Past medical history, social, surgical and family history all reviewed in electronic medical record.  No pertanent information unless stated regarding to the chief complaint.   Review of Systems:  No headache, visual changes, nausea, vomiting, diarrhea, constipation, dizziness, abdominal pain, skin rash, fevers, chills, night sweats, weight loss, swollen lymph nodes, body aches, joint swelling, muscle aches, chest pain, shortness of breath, mood changes.   Objective  Blood pressure 124/82, pulse 67, height 5\' 5"  (1.651 m), weight 133 lb (60.3 kg), SpO2 97 %.    General: No apparent distress alert and oriented x3 mood and affect normal, dressed appropriately.  HEENT: Pupils equal, extraocular movements intact  Respiratory: Patient's speak in full sentences and does not appear short of breath  Cardiovascular: No lower extremity edema, non tender, no erythema  Skin: Warm dry intact with no signs of infection or rash on extremities or on axial skeleton.  Abdomen: Soft nontender  Neuro: Cranial nerves II through XII are intact, neurovascularly intact in all extremities with 2+ DTRs and 2+ pulses.  Lymph: No lymphadenopathy of posterior or anterior cervical chain or axillae bilaterally.  Gait normal with good balance and coordination.  MSK:  tender with full range of motion and good stability and symmetric strength and tone of shoulders, elbows, , hip, knee and ankles bilaterally.  Arthritic changes of multiple joints Right wrist exam does have a positive Tinel sign.  Patient does have positive Phalen's.  No loss of the thenar eminence.  CMC arthritis of the hands bilaterally.  Neck exam has loss of lordosis.  Mild positive Spurling's on the radicular symptoms on the right in the C5-C6 distribution.  Patient does have mild crepitus with range of motion.  5-5 strength of the upper extremities bilaterally  Osteopathic findings   C4 flexed rotated and side bent left C6 flexed rotated and side bent left T3 extended rotated and side bent right inhaled third rib      Impression and Recommendations:     This case required medical decision making of moderate complexity. The above documentation has been reviewed and is accurate and complete [name], DO       Note: This dictation was prepared with Dragon dictation along with smaller phrase technology. Any transcriptional errors that result from this process are unintentional.

## 2018-12-04 NOTE — Assessment & Plan Note (Signed)
Decision today to treat with OMT was based on Physical Exam  After verbal consent patient was treated with HVLA, ME, FPR techniques in cervical, thoracic, rib areas  Patient tolerated the procedure well with improvement in symptoms  Patient given exercises, stretches and lifestyle modifications  See medications in patient instructions if given  Patient will follow up in 4-8 weeks 

## 2018-12-25 DIAGNOSIS — Z23 Encounter for immunization: Secondary | ICD-10-CM | POA: Diagnosis not present

## 2018-12-27 ENCOUNTER — Encounter: Payer: Self-pay | Admitting: Internal Medicine

## 2018-12-28 NOTE — Telephone Encounter (Signed)
Updated in chart

## 2019-01-01 ENCOUNTER — Telehealth: Payer: Self-pay | Admitting: *Deleted

## 2019-01-01 NOTE — Telephone Encounter (Signed)
Patient is calling complaining of vaginal dryness and itching.  Dr Maudie Mercury treated the patient and there was improvement.  Patient states the symptoms have returned and she has tried OTC with no relief. Patient currently does not have a GYN. Please advise?   Okay per patient to respond by MyChart.

## 2019-01-01 NOTE — Telephone Encounter (Signed)
Patient will go to GYN.  Patient will call back if needed.

## 2019-01-01 NOTE — Telephone Encounter (Signed)
She needs a pelvic exam with wet prep. We can do here or send to GYN.

## 2019-01-03 ENCOUNTER — Other Ambulatory Visit: Payer: Self-pay

## 2019-01-03 ENCOUNTER — Ambulatory Visit (INDEPENDENT_AMBULATORY_CARE_PROVIDER_SITE_OTHER): Payer: Medicare Other | Admitting: Family Medicine

## 2019-01-03 ENCOUNTER — Encounter: Payer: Self-pay | Admitting: Family Medicine

## 2019-01-03 VITALS — BP 122/82 | HR 68 | Ht 65.0 in | Wt 135.0 lb

## 2019-01-03 DIAGNOSIS — M999 Biomechanical lesion, unspecified: Secondary | ICD-10-CM

## 2019-01-03 DIAGNOSIS — M5030 Other cervical disc degeneration, unspecified cervical region: Secondary | ICD-10-CM | POA: Diagnosis not present

## 2019-01-03 NOTE — Progress Notes (Signed)
[name] [place], [place] [address] Phone: [phone] Subjective:   Lindsay [name], am serving as a scribe for Dr. Hulan Saas.  I'm seeing this patient by the request  of:    Follow-up CC:   [id number]     01/03/2019 Lindsay [name] is a 68 y.o. female coming in with complaint of neck pain. [name] change in pain since last visit. Using OMT to manage her neck pain.  Patient has been responding fairly well to conservative therapy otherwise.  Patient previously seen mild tightness from time to time.  [name] significant radiation the pain except at night from time to time.  Patient states that when she starts moving seems to do a little better.  Always aware of her neck but nothing that seems to be stopping her from activity at this point.     Past Medical History:  Diagnosis Date  . COMMON MIGRAINE 09/20/2006  . DEPRESSION 06/11/2009  . GERD 06/11/2009  . Hypertension 04/17/2018  . Neck pain    eval with sports medicine in 2016/17  . Osteopenia 04/17/2018   Past Surgical History:  Procedure Laterality Date  . ABDOMINAL HYSTERECTOMY    . CHOLECYSTECTOMY    . OVARIAN CYST REMOVAL    . REPAIR EXTENSOR TENDON HAND    . TONSILLECTOMY     Social History   Socioeconomic History  . Marital status: Married    Spouse name: Not on file  . Number of children: Not on file  . Years of education: Not on file  . Highest education level: Not on file  Occupational History  . Not on file  Social Needs  . Financial resource strain: Not on file  . Food insecurity    Worry: Not on file    Inability: Not on file  . Transportation needs    Medical: Not on file    Non-medical: Not on file  Tobacco Use  . Smoking status: Former Smoker    Types: Cigarettes    Quit date: 04/04/2004    Years since quitting: 14.7  . Smokeless tobacco: Never Used  Substance and Sexual Activity  . Alcohol use: Yes    Alcohol/week: 0.0 standard drinks    Comment: occ   . Drug use: [name]  . Sexual activity: Not on file  Lifestyle  . Physical activity    Days per week: Not on file    Minutes per session: Not on file  . Stress: Not on file  Relationships  . Social Herbalist on phone: Not on file    Gets together: Not on file    Attends religious service: Not on file    Active member of club or organization: Not on file    Attends meetings of clubs or organizations: Not on file    Relationship status: Not on file  Other Topics Concern  . Not on file  Social History Narrative  . Not on file   [name] Known Allergies Family History  Problem Relation Age of Onset  . Dementia Mother   . Osteoporosis Mother   . Breast cancer Sister   . Heart attack Brother   . Colon cancer Neg Hx   . Esophageal cancer Neg Hx   . Rectal cancer Neg Hx   . Stomach cancer Neg Hx      Current Outpatient Medications (Cardiovascular):  .  amLODipine (NORVASC) 2.5 MG tablet, Take 1 tablet (2.5 mg total) by  mouth daily.   Current Outpatient Medications (Analgesics):  Marland Kitchen  SUMAtriptan (IMITREX) 100 MG tablet, TAKE 1/2-1 TABLET BY MOUTH AT ONSET MIGRAINE,MAY REPEAT ONCE 2HOURS IF NEED,MAX 2DOSES IN 24 HOURS   Current Outpatient Medications (Other):  Marland Kitchen  buPROPion (WELLBUTRIN SR) 150 MG 12 hr tablet, TAKE 1 TABLET BY MOUTH TWICE A DAY .  Cholecalciferol (VITAMIN D3) 1000 UNITS capsule, Take 1,000 Units by mouth daily.   .  clindamycin (CLINDAGEL) 1 % gel, Apply topically 2 (two) times daily. .  diphenhydramine-acetaminophen (TYLENOL PM) 25-500 MG TABS, Take 1 tablet by mouth at bedtime as needed. .  Docusate Sodium (COLACE PO), Take by mouth. .  fish oil-omega-3 fatty acids 1000 MG capsule, Take 1 g by mouth daily. .  hydrocortisone (ANUSOL-HC) 2.5 % rectal cream, Place 1 application rectally 2 (two) times daily. Marland Kitchen  MAGNESIUM PO, Take by mouth. .  Misc Natural Products (TART CHERRY ADVANCED PO), Take by mouth daily. .  niacin 250 MG tablet, Take 250 mg by mouth  daily with breakfast.   .  pantoprazole (PROTONIX) 40 MG tablet, Take 1 tablet (40 mg total) by mouth daily.    Past medical history, social, surgical and family history all reviewed in electronic medical record.  [name] pertanent information unless stated regarding to the chief complaint.   Review of Systems:  [name] headache, visual changes, nausea, vomiting, diarrhea, constipation, dizziness, abdominal pain, skin rash, fevers, chills, night sweats, weight loss, swollen lymph nodes, body aches, joint swelling, muscle aches, chest pain, shortness of breath, mood changes.   Objective  There were [name] vitals taken for this visit. Systems examined below as of    General: [name] apparent distress alert and oriented x3 mood and affect normal, dressed appropriately.  HEENT: Pupils equal, extraocular movements intact  Respiratory: Patient's speak in full sentences and does not appear short of breath  Cardiovascular: [name] lower extremity edema, non tender, [name] erythema  Skin: Warm dry intact with [name] signs of infection or rash on extremities or on axial skeleton.  Abdomen: Soft nontender  Neuro: Cranial nerves II through XII are intact, neurovascularly intact in all extremities with 2+ DTRs and 2+ pulses.  Lymph: [name] lymphadenopathy of posterior or anterior cervical chain or axillae bilaterally.  Gait normal with good balance and coordination.  MSK:  Non tender with full range of motion and good stability and symmetric strength and tone of shoulders, elbows, wrist, hip, knee and ankles bilaterally.  Neck exam does have some loss of lordosis.  Mild crepitus.  Still mild positive Spurling's maneuver noted.  5-5 strength though of the hands bilaterally.  Osteopathic findings C2 flexed rotated and side bent right C4 flexed rotated and side bent left C6 flexed rotated and side bent left T3 extended rotated and side bent right inhaled third rib T7 extended rotated and side bent left    Impression and  Recommendations:     This case required medical decision making of moderate complexity. The above documentation has been reviewed and is accurate and complete Lindsay [name]       Note: This dictation was prepared with Dragon dictation along with smaller phrase technology. Any transcriptional errors that result from this process are unintentional.

## 2019-01-03 NOTE — Patient Instructions (Signed)
See me again in 6 weeks 

## 2019-01-03 NOTE — Assessment & Plan Note (Signed)
Stable overall.  Discussed icing regimen exercises, which activities to do which will start.  Patient will continue to work on posture and ergonomics.

## 2019-01-03 NOTE — Assessment & Plan Note (Signed)
Decision today to treat with OMT was based on Physical Exam  After verbal consent patient was treated with HVLA, ME, FPR techniques in cervical, thoracic, rib areas  Patient tolerated the procedure well with improvement in symptoms  Patient given exercises, stretches and lifestyle modifications  See medications in patient instructions if given  Patient will follow up in 4-8 weeks 

## 2019-01-19 ENCOUNTER — Other Ambulatory Visit: Payer: Self-pay | Admitting: Family Medicine

## 2019-01-19 DIAGNOSIS — K219 Gastro-esophageal reflux disease without esophagitis: Secondary | ICD-10-CM

## 2019-01-31 ENCOUNTER — Other Ambulatory Visit: Payer: Self-pay | Admitting: Obstetrics and Gynecology

## 2019-01-31 DIAGNOSIS — Z6821 Body mass index (BMI) 21.0-21.9, adult: Secondary | ICD-10-CM | POA: Diagnosis not present

## 2019-01-31 DIAGNOSIS — Z01419 Encounter for gynecological examination (general) (routine) without abnormal findings: Secondary | ICD-10-CM | POA: Diagnosis not present

## 2019-01-31 DIAGNOSIS — N6320 Unspecified lump in the left breast, unspecified quadrant: Secondary | ICD-10-CM | POA: Diagnosis not present

## 2019-01-31 DIAGNOSIS — N899 Noninflammatory disorder of vagina, unspecified: Secondary | ICD-10-CM | POA: Diagnosis not present

## 2019-02-12 ENCOUNTER — Other Ambulatory Visit: Payer: Medicare Other

## 2019-02-14 ENCOUNTER — Ambulatory Visit (INDEPENDENT_AMBULATORY_CARE_PROVIDER_SITE_OTHER): Payer: Medicare Other | Admitting: Family Medicine

## 2019-02-14 ENCOUNTER — Encounter: Payer: Self-pay | Admitting: Family Medicine

## 2019-02-14 ENCOUNTER — Other Ambulatory Visit: Payer: Self-pay

## 2019-02-14 VITALS — BP 118/60 | HR 63 | Ht 65.0 in | Wt 133.0 lb

## 2019-02-14 DIAGNOSIS — M5030 Other cervical disc degeneration, unspecified cervical region: Secondary | ICD-10-CM

## 2019-02-14 DIAGNOSIS — M999 Biomechanical lesion, unspecified: Secondary | ICD-10-CM | POA: Diagnosis not present

## 2019-02-14 DIAGNOSIS — G5601 Carpal tunnel syndrome, right upper limb: Secondary | ICD-10-CM | POA: Diagnosis not present

## 2019-02-14 NOTE — Assessment & Plan Note (Signed)
Decision today to treat with OMT was based on Physical Exam  After verbal consent patient was treated with HVLA, ME, FPR techniques in cervical, thoracic, rib areas  Patient tolerated the procedure well with improvement in symptoms  Patient given exercises, stretches and lifestyle modifications  See medications in patient instructions if given  Patient will follow up in 6 weeks

## 2019-02-14 NOTE — Assessment & Plan Note (Signed)
We discussed the possibility of repeating injection which patient declined today.

## 2019-02-14 NOTE — Progress Notes (Signed)
[name] [place], [place] [address] Phone: [phone] Subjective:   [name], am serving as a scribe for Dr. Hulan Saas.    CC: Neck pain, hand pain follow-up  [id number]  Lindsay [name] is a 68 y.o. female coming in with complaint of neck and back pain. Patient is here for OMT. Last seen on 01/03/2019. Patient states that she has been doing fine. Finds that her hand is numb in the mornings but after time passes the numbness dissipates.     Past Medical History:  Diagnosis Date  . COMMON MIGRAINE 09/20/2006  . DEPRESSION 06/11/2009  . GERD 06/11/2009  . Hypertension 04/17/2018  . Neck pain    eval with sports medicine in 2016/17  . Osteopenia 04/17/2018   Past Surgical History:  Procedure Laterality Date  . ABDOMINAL HYSTERECTOMY    . CHOLECYSTECTOMY    . OVARIAN CYST REMOVAL    . REPAIR EXTENSOR TENDON HAND    . TONSILLECTOMY     Social History   Socioeconomic History  . Marital status: Married    Spouse name: Not on file  . Number of children: Not on file  . Years of education: Not on file  . Highest education level: Not on file  Occupational History  . Not on file  Social Needs  . Financial resource strain: Not on file  . Food insecurity    Worry: Not on file    Inability: Not on file  . Transportation needs    Medical: Not on file    Non-medical: Not on file  Tobacco Use  . Smoking status: Former Smoker    Types: Cigarettes    Quit date: 04/04/2004    Years since quitting: 14.8  . Smokeless tobacco: Never Used  Substance and Sexual Activity  . Alcohol use: Yes    Alcohol/week: 0.0 standard drinks    Comment: occ  . Drug use: No  . Sexual activity: Not on file  Lifestyle  . Physical activity    Days per week: Not on file    Minutes per session: Not on file  . Stress: Not on file  Relationships  . Social Herbalist on phone: Not on file    Gets together: Not on file    Attends  religious service: Not on file    Active member of club or organization: Not on file    Attends meetings of clubs or organizations: Not on file    Relationship status: Not on file  Other Topics Concern  . Not on file  Social History Narrative  . Not on file   No Known Allergies Family History  Problem Relation Age of Onset  . Dementia Mother   . Osteoporosis Mother   . Breast cancer Sister   . Heart attack Brother   . Colon cancer Neg Hx   . Esophageal cancer Neg Hx   . Rectal cancer Neg Hx   . Stomach cancer Neg Hx      Current Outpatient Medications (Cardiovascular):  .  amLODipine (NORVASC) 2.5 MG tablet, Take 1 tablet (2.5 mg total) by mouth daily.   Current Outpatient Medications (Analgesics):  Marland Kitchen  SUMAtriptan (IMITREX) 100 MG tablet, TAKE 1/2-1 TABLET BY MOUTH AT ONSET MIGRAINE,MAY REPEAT ONCE 2HOURS IF NEED,MAX 2DOSES IN 24 HOURS   Current Outpatient Medications (Other):  Marland Kitchen  buPROPion (WELLBUTRIN SR) 150 MG 12 hr tablet, TAKE 1 TABLET BY MOUTH TWICE A  DAY .  Cholecalciferol (VITAMIN D3) 1000 UNITS capsule, Take 1,000 Units by mouth daily.   .  clindamycin (CLINDAGEL) 1 % gel, Apply topically 2 (two) times daily. .  diphenhydramine-acetaminophen (TYLENOL PM) 25-500 MG TABS, Take 1 tablet by mouth at bedtime as needed. .  Docusate Sodium (COLACE PO), Take by mouth. .  fish oil-omega-3 fatty acids 1000 MG capsule, Take 1 g by mouth daily. .  hydrocortisone (ANUSOL-HC) 2.5 % rectal cream, Place 1 application rectally 2 (two) times daily. Marland Kitchen  MAGNESIUM PO, Take by mouth. .  Misc Natural Products (TART CHERRY ADVANCED PO), Take by mouth daily. .  niacin 250 MG tablet, Take 250 mg by mouth daily with breakfast.   .  pantoprazole (PROTONIX) 40 MG tablet, TAKE 1 TABLET BY MOUTH DAILY    Past medical history, social, surgical and family history all reviewed in electronic medical record.  No pertanent information unless stated regarding to the chief complaint.   Review of  Systems:  No headache, visual changes, nausea, vomiting, diarrhea, constipation, dizziness, abdominal pain, skin rash, fevers, chills, night sweats, weight loss, swollen lymph nodes, body aches, joint swelling, muscle aches, chest pain, shortness of breath, mood changes.   Objective  Blood pressure 118/60, pulse 63, height 5\' 5"  (1.651 m), weight 133 lb (60.3 kg), SpO2 99 %.    General: No apparent distress alert and oriented x3 mood and affect normal, dressed appropriately.  HEENT: Pupils equal, extraocular movements intact  Respiratory: Patient's speak in full sentences and does not appear short of breath  Cardiovascular: No lower extremity edema, non tender, no erythema  Skin: Warm dry intact with no signs of infection or rash on extremities or on axial skeleton.  Abdomen: Soft nontender  Neuro: Cranial nerves II through XII are intact, neurovascularly intact in all extremities with 2+ DTRs and 2+ pulses.  Lymph: No lymphadenopathy of posterior or anterior cervical chain or axillae bilaterally.  Gait normal with good balance and coordination.  MSK:  Non tender with full range of motion and good stability and symmetric strength and tone of shoulders, elbows, hip, knee and a OA stability Patient's wrist exam still has a positive Tinel's.  Moderate arthritic changes of multiple joints.  Neck exam does have some loss of lordosis.  Positive Spurling's on the right side with radicular symptoms in the C7-C8 distribution.  Osteopathic findings  C2 flexed rotated and side bent right C4 flexed rotated and side bent left C6 flexed rotated and side bent left T6 extended rotated and side bent right inhaled rib    Impression and Recommendations:     This case required medical decision making of moderate complexity. The above documentation has been reviewed and is accurate and complete [name], DO       Note: This dictation was prepared with Dragon dictation along with smaller  phrase technology. Any transcriptional errors that result from this process are unintentional.

## 2019-02-14 NOTE — Patient Instructions (Signed)
Double up amoldipine Consider glove liners at night See me again in 6 weeks

## 2019-02-14 NOTE — Assessment & Plan Note (Signed)
Degenerative disc disease.  We discussed which activities to do which will support.  Discussed icing regimen and home exercises.  Discussed avoiding certain activities.  Follow-up again in 6 weeks.

## 2019-02-19 ENCOUNTER — Ambulatory Visit
Admission: RE | Admit: 2019-02-19 | Discharge: 2019-02-19 | Disposition: A | Discharge status: Home / Self Care | Payer: Medicare Other | Source: Ambulatory Visit | Attending: Obstetrics and Gynecology | Admitting: Obstetrics and Gynecology

## 2019-02-19 ENCOUNTER — Other Ambulatory Visit: Payer: Self-pay

## 2019-02-19 DIAGNOSIS — N6022 Fibroadenosis of left breast: Secondary | ICD-10-CM | POA: Diagnosis not present

## 2019-02-19 DIAGNOSIS — R928 Other abnormal and inconclusive findings on diagnostic imaging of breast: Secondary | ICD-10-CM | POA: Diagnosis not present

## 2019-02-19 DIAGNOSIS — N6320 Unspecified lump in the left breast, unspecified quadrant: Secondary | ICD-10-CM

## 2019-03-07 DIAGNOSIS — Z20828 Contact with and (suspected) exposure to other viral communicable diseases: Secondary | ICD-10-CM | POA: Diagnosis not present

## 2019-03-25 ENCOUNTER — Encounter: Payer: Self-pay | Admitting: Family Medicine

## 2019-03-26 ENCOUNTER — Ambulatory Visit: Payer: Medicare Other | Admitting: Family Medicine

## 2019-04-02 ENCOUNTER — Encounter: Payer: Self-pay | Admitting: Family Medicine

## 2019-04-03 ENCOUNTER — Other Ambulatory Visit: Payer: Self-pay | Admitting: Internal Medicine

## 2019-04-20 ENCOUNTER — Encounter: Payer: Self-pay | Admitting: Internal Medicine

## 2019-04-25 ENCOUNTER — Other Ambulatory Visit: Payer: Self-pay | Admitting: Internal Medicine

## 2019-04-25 DIAGNOSIS — Z1231 Encounter for screening mammogram for malignant neoplasm of breast: Secondary | ICD-10-CM

## 2019-04-30 ENCOUNTER — Other Ambulatory Visit: Payer: Self-pay

## 2019-04-30 ENCOUNTER — Ambulatory Visit (INDEPENDENT_AMBULATORY_CARE_PROVIDER_SITE_OTHER): Payer: Medicare Other | Admitting: Family Medicine

## 2019-04-30 ENCOUNTER — Encounter: Payer: Self-pay | Admitting: Family Medicine

## 2019-04-30 VITALS — BP 132/82 | HR 74 | Ht 65.0 in | Wt 133.0 lb

## 2019-04-30 DIAGNOSIS — M5030 Other cervical disc degeneration, unspecified cervical region: Secondary | ICD-10-CM | POA: Diagnosis not present

## 2019-04-30 DIAGNOSIS — M999 Biomechanical lesion, unspecified: Secondary | ICD-10-CM | POA: Diagnosis not present

## 2019-04-30 NOTE — Assessment & Plan Note (Signed)
Decision today to treat with OMT was based on Physical Exam  After verbal consent patient was treated with HVLA, ME, FPR techniques in cervical, thoracic, rib,  areas  Patient tolerated the procedure well with improvement in symptoms  Patient given exercises, stretches and lifestyle modifications  See medications in patient instructions if given  Patient will follow up in 4-8 weeks 

## 2019-04-30 NOTE — Progress Notes (Signed)
[place] Phone: [phone] Subjective:   Lindsay [name], am serving as a scribe for Dr. Hulan Saas. This visit occurred during the SARS-CoV-2 public health emergency.  Safety protocols were in place, including screening questions prior to the visit, additional usage of staff PPE, and extensive cleaning of exam room while observing appropriate contact time as indicated for disinfecting solutions.   I'm seeing this patient by the request  of:  Isaac Bliss, Rayford Halsted, MD  CC: Neck pain follow-up  [id number]  Lindsay [name] is a 69 y.o. female coming in with complaint of back pain. Last seen on 02/14/2019 for OMT. Patient states that she has been doing well.  Known degenerative disc disease.  Patient is having some mild exacerbation of this chronic illness.  Patient has had to take anti-inflammatories on a more regular basis at the moment.  Has not been able to work out quite as regularly secondary to psychosocial determinants of being the primary caregiver for her GrandChildren.       Past Medical History:  Diagnosis Date  . COMMON MIGRAINE 09/20/2006  . DEPRESSION 06/11/2009  . GERD 06/11/2009  . Hypertension 04/17/2018  . Neck pain    eval with sports medicine in 2016/17  . Osteopenia 04/17/2018   Past Surgical History:  Procedure Laterality Date  . ABDOMINAL HYSTERECTOMY    . CHOLECYSTECTOMY    . OVARIAN CYST REMOVAL    . REPAIR EXTENSOR TENDON HAND    . TONSILLECTOMY     Social History   Socioeconomic History  . Marital status: Married    Spouse name: Not on file  . Number of children: Not on file  . Years of education: Not on file  . Highest education level: Not on file  Occupational History  . Not on file  Tobacco Use  . Smoking status: Former Smoker    Types: Cigarettes    Quit date: 04/04/2004    Years since quitting: 15.0  . Smokeless tobacco: Never Used  Substance and Sexual  Activity  . Alcohol use: Yes    Alcohol/week: 0.0 standard drinks    Comment: occ  . Drug use: [name]  . Sexual activity: Not on file  Other Topics Concern  . Not on file  Social History Narrative  . Not on file   Social Determinants of Health   Financial Resource Strain:   . Difficulty of Paying Living Expenses: Not on file  Food Insecurity:   . Worried About Charity fundraiser in the Last Year: Not on file  . Ran Out of Food in the Last Year: Not on file  Transportation Needs:   . Lack of Transportation (Medical): Not on file  . Lack of Transportation (Non-Medical): Not on file  Physical Activity:   . Days of Exercise per Week: Not on file  . Minutes of Exercise per Session: Not on file  Stress:   . Feeling of Stress : Not on file  Social Connections:   . Frequency of Communication with Friends and Family: Not on file  . Frequency of Social Gatherings with Friends and Family: Not on file  . Attends Religious Services: Not on file  . Active Member of Clubs or Organizations: Not on file  . Attends Archivist Meetings: Not on file  . Marital Status: Not on file   [name] Known Allergies Family History  Problem Relation Age of Onset  . Dementia Mother   .  Osteoporosis Mother   . Breast cancer Sister   . Heart attack Brother   . Colon cancer Neg Hx   . Esophageal cancer Neg Hx   . Rectal cancer Neg Hx   . Stomach cancer Neg Hx      Current Outpatient Medications (Cardiovascular):  .  amLODipine (NORVASC) 2.5 MG tablet, TAKE 1 TABLET BY MOUTH EVERY DAY   Current Outpatient Medications (Analgesics):  Marland Kitchen  SUMAtriptan (IMITREX) 100 MG tablet, TAKE 1/2-1 TABLET BY MOUTH AT ONSET MIGRAINE,MAY REPEAT ONCE 2HOURS IF NEED,MAX 2DOSES IN 24 HOURS   Current Outpatient Medications (Other):  Marland Kitchen  buPROPion (WELLBUTRIN SR) 150 MG 12 hr tablet, TAKE 1 TABLET BY MOUTH TWICE A DAY .  Cholecalciferol (VITAMIN D3) 1000 UNITS capsule, Take 1,000 Units by mouth daily.   .   clindamycin (CLINDAGEL) 1 % gel, Apply topically 2 (two) times daily. .  diphenhydramine-acetaminophen (TYLENOL PM) 25-500 MG TABS, Take 1 tablet by mouth at bedtime as needed. .  Docusate Sodium (COLACE PO), Take by mouth. .  fish oil-omega-3 fatty acids 1000 MG capsule, Take 1 g by mouth daily. .  hydrocortisone (ANUSOL-HC) 2.5 % rectal cream, Place 1 application rectally 2 (two) times daily. Marland Kitchen  MAGNESIUM PO, Take by mouth. .  Misc Natural Products (TART CHERRY ADVANCED PO), Take by mouth daily. .  niacin 250 MG tablet, Take 250 mg by mouth daily with breakfast.   .  pantoprazole (PROTONIX) 40 MG tablet, TAKE 1 TABLET BY MOUTH DAILY   Reviewed prior external information including notes and imaging from  primary care provider As well as notes that were available from care everywhere and other healthcare systems.  Past medical history, social, surgical and family history all reviewed in electronic medical record.  [name] pertanent information unless stated regarding to the chief complaint.   Review of Systems:  [name] headache, visual changes, nausea, vomiting, diarrhea, constipation, dizziness, abdominal pain, skin rash, fevers, chills, night sweats, weight loss, swollen lymph nodes, body aches, joint swelling, chest pain, shortness of breath, mood changes. POSITIVE muscle aches headaches seem to be better  Objective  Blood pressure 132/82, pulse 74, height 5\' 5"  (1.651 m), weight 133 lb (60.3 kg), SpO2 97 %.   General: [name] apparent distress alert and oriented x3 mood and affect normal, dressed appropriately.  HEENT: Pupils equal, extraocular movements intact  Respiratory: Patient's speak in full sentences and does not appear short of breath  Cardiovascular: [name] lower extremity edema, non tender, [name] erythema  Skin: Warm dry intact with [name] signs of infection or rash on extremities or on axial skeleton.  Abdomen: Soft nontender  Neuro: Cranial nerves II through XII are intact, neurovascularly  intact in all extremities with 2+ DTRs and 2+ pulses.  Lymph: [name] lymphadenopathy of posterior or anterior cervical chain or axillae bilaterally.  Gait normal with good balance and coordination.  MSK:  tender with full range of motion and good stability and symmetric strength and tone of shoulders, elbows, , hip, knee and ankles bilaterally.  Moderate arthritic changes of multiple joints Loss of lordosis of the cervical spine.  Patient does have some crepitus.  Negative Tinel's over noted today.  Negative Spurling's as well.  Patient does have some tightness in the parascapular region right greater than left  Osteopathic findings  C2 flexed rotated and side bent right C6 flexed rotated and side bent left T3 extended rotated and side bent right inhaled third rib T8 extended rotated and side bent left  Impression and Recommendations:     This case required medical decision making of moderate complexity. The above documentation has been reviewed and is accurate and complete [name], DO       Note: This dictation was prepared with Dragon dictation along with smaller phrase technology. Any transcriptional errors that result from this process are unintentional.

## 2019-04-30 NOTE — Assessment & Plan Note (Signed)
Discussed with patient in great length about icing regimen, home exercise, which activities to do which was to avoid.  Patient is to increase activity slowly over the course the next several weeks.  Discussed icing regimen.  Patient continues to respond well to conservative therapy at this time.  Follow-up again in 4 to 8 weeks.

## 2019-04-30 NOTE — Patient Instructions (Signed)
See me again in 6 weeks 

## 2019-05-16 ENCOUNTER — Ambulatory Visit: Payer: Medicare Other

## 2019-05-16 DIAGNOSIS — H3582 Retinal ischemia: Secondary | ICD-10-CM | POA: Diagnosis not present

## 2019-05-16 DIAGNOSIS — H35033 Hypertensive retinopathy, bilateral: Secondary | ICD-10-CM | POA: Diagnosis not present

## 2019-05-16 DIAGNOSIS — H43813 Vitreous degeneration, bilateral: Secondary | ICD-10-CM | POA: Diagnosis not present

## 2019-05-16 DIAGNOSIS — H35372 Puckering of macula, left eye: Secondary | ICD-10-CM | POA: Diagnosis not present

## 2019-06-04 ENCOUNTER — Ambulatory Visit: Payer: Medicare Other

## 2019-06-11 ENCOUNTER — Ambulatory Visit (INDEPENDENT_AMBULATORY_CARE_PROVIDER_SITE_OTHER): Payer: Medicare Other

## 2019-06-11 ENCOUNTER — Other Ambulatory Visit: Payer: Self-pay

## 2019-06-11 ENCOUNTER — Ambulatory Visit (INDEPENDENT_AMBULATORY_CARE_PROVIDER_SITE_OTHER): Payer: Medicare Other | Admitting: Family Medicine

## 2019-06-11 ENCOUNTER — Encounter: Payer: Self-pay | Admitting: Family Medicine

## 2019-06-11 VITALS — BP 120/70 | HR 70 | Ht 65.0 in | Wt 133.0 lb

## 2019-06-11 DIAGNOSIS — M25571 Pain in right ankle and joints of right foot: Secondary | ICD-10-CM

## 2019-06-11 DIAGNOSIS — S8264XA Nondisplaced fracture of lateral malleolus of right fibula, initial encounter for closed fracture: Secondary | ICD-10-CM | POA: Diagnosis not present

## 2019-06-11 DIAGNOSIS — S93401A Sprain of unspecified ligament of right ankle, initial encounter: Secondary | ICD-10-CM | POA: Diagnosis not present

## 2019-06-11 NOTE — Progress Notes (Signed)
[place] Phone: [phone] Subjective:   Lindsay [name], am serving as a scribe for Dr. Hulan Saas. This visit occurred during the SARS-CoV-2 public health emergency.  Safety protocols were in place, including screening questions prior to the visit, additional usage of staff PPE, and extensive cleaning of exam room while observing appropriate contact time as indicated for disinfecting solutions.    I'm seeing this patient by the request  of:  Erline Hau, MD  CC: Right ankle pain  [id number]  Lindsay [name] is a 69 y.o. female coming in with complaint of neck and back pain. Also having right ankle pain. Slipped yesterday. pain over distal fibula and ATF. Denis any numbness or tingling.  Patient states it has been difficult to walk on.  Only using a crutch at the moment.  Patient states that she does not think she can put her full weight on it.  Possibly minorly better from yesterday ago.      Past Medical History:  Diagnosis Date  . COMMON MIGRAINE 09/20/2006  . DEPRESSION 06/11/2009  . GERD 06/11/2009  . Hypertension 04/17/2018  . Neck pain    eval with sports medicine in 2016/17  . Osteopenia 04/17/2018   Past Surgical History:  Procedure Laterality Date  . ABDOMINAL HYSTERECTOMY    . CHOLECYSTECTOMY    . OVARIAN CYST REMOVAL    . REPAIR EXTENSOR TENDON HAND    . TONSILLECTOMY     Social History   Socioeconomic History  . Marital status: Married    Spouse name: Not on file  . Number of children: Not on file  . Years of education: Not on file  . Highest education level: Not on file  Occupational History  . Not on file  Tobacco Use  . Smoking status: Former Smoker    Types: Cigarettes    Quit date: 04/04/2004    Years since quitting: 15.1  . Smokeless tobacco: Never Used  Substance and Sexual Activity  . Alcohol use: Yes    Alcohol/week: 0.0 standard drinks    Comment: occ    . Drug use: [name]  . Sexual activity: Not on file  Other Topics Concern  . Not on file  Social History Narrative  . Not on file   Social Determinants of Health   Financial Resource Strain:   . Difficulty of Paying Living Expenses: Not on file  Food Insecurity:   . Worried About Charity fundraiser in the Last Year: Not on file  . Ran Out of Food in the Last Year: Not on file  Transportation Needs:   . Lack of Transportation (Medical): Not on file  . Lack of Transportation (Non-Medical): Not on file  Physical Activity:   . Days of Exercise per Week: Not on file  . Minutes of Exercise per Session: Not on file  Stress:   . Feeling of Stress : Not on file  Social Connections:   . Frequency of Communication with Friends and Family: Not on file  . Frequency of Social Gatherings with Friends and Family: Not on file  . Attends Religious Services: Not on file  . Active Member of Clubs or Organizations: Not on file  . Attends Archivist Meetings: Not on file  . Marital Status: Not on file   [name] Known Allergies Family History  Problem Relation Age of Onset  . Dementia Mother   . Osteoporosis Mother   .  Breast cancer Sister   . Heart attack Brother   . Colon cancer Neg Hx   . Esophageal cancer Neg Hx   . Rectal cancer Neg Hx   . Stomach cancer Neg Hx      Current Outpatient Medications (Cardiovascular):  .  amLODipine (NORVASC) 2.5 MG tablet, TAKE 1 TABLET BY MOUTH EVERY DAY   Current Outpatient Medications (Analgesics):  Marland Kitchen  SUMAtriptan (IMITREX) 100 MG tablet, TAKE 1/2-1 TABLET BY MOUTH AT ONSET MIGRAINE,MAY REPEAT ONCE 2HOURS IF NEED,MAX 2DOSES IN 24 HOURS   Current Outpatient Medications (Other):  Marland Kitchen  buPROPion (WELLBUTRIN SR) 150 MG 12 hr tablet, TAKE 1 TABLET BY MOUTH TWICE A DAY .  Cholecalciferol (VITAMIN D3) 1000 UNITS capsule, Take 1,000 Units by mouth daily.   .  clindamycin (CLINDAGEL) 1 % gel, Apply topically 2 (two) times daily. .   diphenhydramine-acetaminophen (TYLENOL PM) 25-500 MG TABS, Take 1 tablet by mouth at bedtime as needed. .  Docusate Sodium (COLACE PO), Take by mouth. .  fish oil-omega-3 fatty acids 1000 MG capsule, Take 1 g by mouth daily. .  hydrocortisone (ANUSOL-HC) 2.5 % rectal cream, Place 1 application rectally 2 (two) times daily. Marland Kitchen  MAGNESIUM PO, Take by mouth. .  Misc Natural Products (TART CHERRY ADVANCED PO), Take by mouth daily. .  niacin 250 MG tablet, Take 250 mg by mouth daily with breakfast.   .  pantoprazole (PROTONIX) 40 MG tablet, TAKE 1 TABLET BY MOUTH DAILY   Reviewed prior external information including notes and imaging from  primary care provider As well as notes that were available from care everywhere and other healthcare systems.  Past medical history, social, surgical and family history all reviewed in electronic medical record.  [name] pertanent information unless stated regarding to the chief complaint.   Review of Systems:  [name] headache, visual changes, nausea, vomiting, diarrhea, constipation, dizziness, abdominal pain, skin rash, fevers, chills, night sweats, weight loss, swollen lymph nodes, body aches, j chest pain, shortness of breath, mood changes. POSITIVE muscle aches, joint swelling  Objective  There were [name] vitals taken for this visit.   General: [name] apparent distress alert and oriented x3 mood and affect normal, dressed appropriately.  HEENT: Pupils equal, extraocular movements intact  Respiratory: Patient's speak in full sentences and does not appear short of breath  Cardiovascular: [name] lower extremity edema, non tender, [name] erythema  Skin: Warm dry intact with [name] signs of infection or rash on extremities or on axial skeleton.  Abdomen: Soft nontender  Neuro: Cranial nerves II through XII are intact, neurovascularly intact in all extremities with 2+ DTRs and 2+ pulses.  Lymph: [name] lymphadenopathy of posterior or anterior cervical chain or axillae bilaterally.  Gait  severely antalgic favoring right ankle and using 1 crutch MSK: Right ankle shows that patient does have tenderness to palpation mostly over the lateral malleolus as well as the lateral foot peroneal tendons.  [name] pain over the medial joint space.  Patient though has limited range of motion secondary to voluntary guarding.  Neurovascularly intact distally.  Negative calf squeeze test.  Limited musculoskeletal ultrasound was performed and interpreted by [name]  Limited ultrasound shows the patient does have a nondisplaced fracture of the lateral malleolus noted on ultrasound with a cortical irregularity and overlying hypoechoic changes.  Significant soft tissue hypoechoic changes mild hypoechoic changes noted of the deltoid ligament but appears to be intact.  Difficult to assess ATFL Impression: Lateral malleolus fracture   Impression and Recommendations:  This case required medical decision making of moderate complexity. The above documentation has been reviewed and is accurate and complete [name], DO       Note: This dictation was prepared with Dragon dictation along with smaller phrase technology. Any transcriptional errors that result from this process are unintentional.

## 2019-06-11 NOTE — Patient Instructions (Addendum)
Ankle pumps and ABCs Ok to come out of boot with sitting Arnica lotion Ice 20 min, 2x a day See me in 2-3 weeks

## 2019-06-11 NOTE — Assessment & Plan Note (Signed)
Very small.  Overlying x-rays are pending at the moment.  This should actually heal relatively well.  Due to patient potential noncompliance put patient in a cam walker even though we can probably advance to a Aircast fairly quickly.  Discussed avoiding certain activities initially.  Discussed home exercise, which activities to do which wants to avoid.  Discussed Arnica lotion.  Follow-up again in 2 to 3 weeks and we will reimage and advance patient's activity accordingly.

## 2019-06-17 ENCOUNTER — Encounter: Payer: Self-pay | Admitting: Family Medicine

## 2019-06-24 NOTE — Progress Notes (Signed)
[place] Phone: [phone] Subjective:   Lindsay [name], am serving as a scribe for Dr. Hulan Saas. This visit occurred during the SARS-CoV-2 public health emergency.  Safety protocols were in place, including screening questions prior to the visit, additional usage of staff PPE, and extensive cleaning of exam room while observing appropriate contact time as indicated for disinfecting solutions.    I'm seeing this patient by the request  of:  Isaac Bliss, Rayford Halsted, MD  CC:   [id number]   06/11/2019 Very small.  Overlying x-rays are pending at the moment.  This should actually heal relatively well.  Due to patient potential noncompliance put patient in a cam walker even though we can probably advance to a Aircast fairly quickly.  Discussed avoiding certain activities initially.  Discussed home exercise, which activities to do which wants to avoid.  Discussed Arnica lotion.  Follow-up again in 2 to 3 weeks and we will reimage and advance patient's activity accordingly.  Update 06/25/2019 Lindsay [name] is a 69 y.o. female coming in with complaint of right ankle pain. Patient states that her pain, discoloration and swelling is less than last visit. Tender to palpation over the bone but this is improving.  Patient was found to have a nondisplaced lateral malleolus fracture.      Past Medical History:  Diagnosis Date  . COMMON MIGRAINE 09/20/2006  . DEPRESSION 06/11/2009  . GERD 06/11/2009  . Hypertension 04/17/2018  . Neck pain    eval with sports medicine in 2016/17  . Osteopenia 04/17/2018   Past Surgical History:  Procedure Laterality Date  . ABDOMINAL HYSTERECTOMY    . CHOLECYSTECTOMY    . OVARIAN CYST REMOVAL    . REPAIR EXTENSOR TENDON HAND    . TONSILLECTOMY     Social History   Socioeconomic History  . Marital status: Married    Spouse name: Not on file  . Number of children: Not on file  .  Years of education: Not on file  . Highest education level: Not on file  Occupational History  . Not on file  Tobacco Use  . Smoking status: Former Smoker    Types: Cigarettes    Quit date: 04/04/2004    Years since quitting: 15.2  . Smokeless tobacco: Never Used  Substance and Sexual Activity  . Alcohol use: Yes    Alcohol/week: 0.0 standard drinks    Comment: occ  . Drug use: [name]  . Sexual activity: Not on file  Other Topics Concern  . Not on file  Social History Narrative  . Not on file   Social Determinants of Health   Financial Resource Strain:   . Difficulty of Paying Living Expenses:   Food Insecurity:   . Worried About Charity fundraiser in the Last Year:   . Arboriculturist in the Last Year:   Transportation Needs:   . Film/video editor (Medical):   Marland Kitchen Lack of Transportation (Non-Medical):   Physical Activity:   . Days of Exercise per Week:   . Minutes of Exercise per Session:   Stress:   . Feeling of Stress :   Social Connections:   . Frequency of Communication with Friends and Family:   . Frequency of Social Gatherings with Friends and Family:   . Attends Religious Services:   . Active Member of Clubs or Organizations:   . Attends Archivist Meetings:   .  Marital Status:    [name] Known Allergies Family History  Problem Relation Age of Onset  . Dementia Mother   . Osteoporosis Mother   . Breast cancer Sister   . Heart attack Brother   . Colon cancer Neg Hx   . Esophageal cancer Neg Hx   . Rectal cancer Neg Hx   . Stomach cancer Neg Hx      Current Outpatient Medications (Cardiovascular):  .  amLODipine (NORVASC) 2.5 MG tablet, TAKE 1 TABLET BY MOUTH EVERY DAY   Current Outpatient Medications (Analgesics):  Marland Kitchen  SUMAtriptan (IMITREX) 100 MG tablet, TAKE 1/2-1 TABLET BY MOUTH AT ONSET MIGRAINE,MAY REPEAT ONCE 2HOURS IF NEED,MAX 2DOSES IN 24 HOURS   Current Outpatient Medications (Other):  Marland Kitchen  buPROPion (WELLBUTRIN SR) 150 MG 12 hr  tablet, TAKE 1 TABLET BY MOUTH TWICE A DAY .  Cholecalciferol (VITAMIN D3) 1000 UNITS capsule, Take 1,000 Units by mouth daily.   .  clindamycin (CLINDAGEL) 1 % gel, Apply topically 2 (two) times daily. .  diphenhydramine-acetaminophen (TYLENOL PM) 25-500 MG TABS, Take 1 tablet by mouth at bedtime as needed. .  Docusate Sodium (COLACE PO), Take by mouth. .  fish oil-omega-3 fatty acids 1000 MG capsule, Take 1 g by mouth daily. .  hydrocortisone (ANUSOL-HC) 2.5 % rectal cream, Place 1 application rectally 2 (two) times daily. Marland Kitchen  MAGNESIUM PO, Take by mouth. .  Misc Natural Products (TART CHERRY ADVANCED PO), Take by mouth daily. .  niacin 250 MG tablet, Take 250 mg by mouth daily with breakfast.   .  pantoprazole (PROTONIX) 40 MG tablet, TAKE 1 TABLET BY MOUTH DAILY   Reviewed prior external information including notes and imaging from  primary care provider As well as notes that were available from care everywhere and other healthcare systems.  Past medical history, social, surgical and family history all reviewed in electronic medical record.  [name] pertanent information unless stated regarding to the chief complaint.   Review of Systems:  [name] headache, visual changes, nausea, vomiting, diarrhea, constipation, dizziness, abdominal pain, skin rash, fevers, chills, night sweats, weight loss, swollen lymph nodes, body aches, joint swelling, chest pain, shortness of breath, mood changes. POSITIVE muscle aches  Objective  Blood pressure 122/78, pulse 66, height 5\' 5"  (1.651 m), weight 133 lb (60.3 kg), SpO2 96 %.   General: [name] apparent distress alert and oriented x3 mood and affect normal, dressed appropriately.  HEENT: Pupils equal, extraocular movements intact  Respiratory: Patient's speak in full sentences and does not appear short of breath  Cardiovascular: [name] lower extremity edema, non tender, [name] erythema  Neuro: Cranial nerves II through XII are intact, neurovascularly intact in all  extremities with 2+ DTRs and 2+ pulses.  Right ankle exam still shows that patient does have significant swelling noted at that mortise.  Patient is still tender to palpation over the lateral malleolus noted.  [name] pain over the fibular head.  Patient has minimal pain over the medial malleolus.  Patient has significant improvement in range of motion.  Neurovascularly intact distally   Limited musculoskeletal ultrasound was performed and interpreted by.me Limited ultrasound of patient's right ankle shows that patient does have good callus formation starting on the lateral inferior malleolus tip where the fracture was appreciated previously.  Patient does have a fairly large ankle effusion still noted.  Seems potentially been worse than previous exam, [name] cortical irregularity though of the talar dome appreciated on ultrasound today   Impression and Recommendations:  The above documentation has been reviewed and is accurate and complete Lindsay Pulley, DO       Note: This dictation was prepared with Dragon dictation along with smaller phrase technology. Any transcriptional errors that result from this process are unintentional.

## 2019-06-25 ENCOUNTER — Ambulatory Visit (INDEPENDENT_AMBULATORY_CARE_PROVIDER_SITE_OTHER): Payer: Medicare Other | Admitting: Family Medicine

## 2019-06-25 ENCOUNTER — Encounter: Payer: Self-pay | Admitting: Family Medicine

## 2019-06-25 ENCOUNTER — Ambulatory Visit (INDEPENDENT_AMBULATORY_CARE_PROVIDER_SITE_OTHER): Payer: Medicare Other

## 2019-06-25 ENCOUNTER — Other Ambulatory Visit: Payer: Self-pay

## 2019-06-25 VITALS — BP 122/78 | HR 66 | Ht 65.0 in | Wt 133.0 lb

## 2019-06-25 DIAGNOSIS — M25571 Pain in right ankle and joints of right foot: Secondary | ICD-10-CM

## 2019-06-25 DIAGNOSIS — S8264XA Nondisplaced fracture of lateral malleolus of right fibula, initial encounter for closed fracture: Secondary | ICD-10-CM

## 2019-06-25 NOTE — Assessment & Plan Note (Signed)
Acute injury with some mild healing noted.  interventions previously, including medication management: Patient given ibuprofen for 3-day burst   Interventions this visit: Transition into the Aircast from cam walker boot patient will go back and forth We discussed with patient the importance ergonomics, home exercises, icing regimen, and over-the-counter natural products.   Future considerations but will be based on evaluation and next visit: Due to the amount of swelling in the ankle mortise will consider the possibility of need of advanced imaging.    Return to clinic: 3 weeks

## 2019-06-25 NOTE — Patient Instructions (Signed)
Continue the ice and everything else See me again in 3-4 weeks

## 2019-07-04 ENCOUNTER — Other Ambulatory Visit: Payer: Self-pay

## 2019-07-04 ENCOUNTER — Ambulatory Visit (INDEPENDENT_AMBULATORY_CARE_PROVIDER_SITE_OTHER): Payer: Medicare Other | Admitting: Internal Medicine

## 2019-07-04 ENCOUNTER — Encounter: Payer: Self-pay | Admitting: Internal Medicine

## 2019-07-04 VITALS — BP 120/80 | HR 76 | Temp 97.3°F | Ht 65.0 in | Wt 133.4 lb

## 2019-07-04 DIAGNOSIS — D229 Melanocytic nevi, unspecified: Secondary | ICD-10-CM | POA: Diagnosis not present

## 2019-07-04 DIAGNOSIS — Z13820 Encounter for screening for osteoporosis: Secondary | ICD-10-CM | POA: Diagnosis not present

## 2019-07-04 DIAGNOSIS — Z0000 Encounter for general adult medical examination without abnormal findings: Secondary | ICD-10-CM

## 2019-07-04 DIAGNOSIS — I10 Essential (primary) hypertension: Secondary | ICD-10-CM

## 2019-07-04 DIAGNOSIS — Z720 Tobacco use: Secondary | ICD-10-CM

## 2019-07-04 DIAGNOSIS — Z1283 Encounter for screening for malignant neoplasm of skin: Secondary | ICD-10-CM | POA: Diagnosis not present

## 2019-07-04 DIAGNOSIS — F418 Other specified anxiety disorders: Secondary | ICD-10-CM

## 2019-07-04 DIAGNOSIS — H3561 Retinal hemorrhage, right eye: Secondary | ICD-10-CM

## 2019-07-04 DIAGNOSIS — G43009 Migraine without aura, not intractable, without status migrainosus: Secondary | ICD-10-CM

## 2019-07-04 DIAGNOSIS — K219 Gastro-esophageal reflux disease without esophagitis: Secondary | ICD-10-CM | POA: Diagnosis not present

## 2019-07-04 DIAGNOSIS — Z87891 Personal history of nicotine dependence: Secondary | ICD-10-CM

## 2019-07-04 LAB — COMPREHENSIVE METABOLIC PANEL
ALT: 16 U/L (ref 0–35)
AST: 17 U/L (ref 0–37)
Albumin: 4.4 g/dL (ref 3.5–5.2)
Alkaline Phosphatase: 73 U/L (ref 39–117)
BUN: 16 mg/dL (ref 6–23)
CO2: 29 mEq/L (ref 19–32)
Calcium: 9.5 mg/dL (ref 8.4–10.5)
Chloride: 104 mEq/L (ref 96–112)
Creatinine, Ser: 0.83 mg/dL (ref 0.40–1.20)
GFR: 68.64 mL/min (ref >60.00)
Glucose, Bld: 88 mg/dL (ref 70–99)
Potassium: 4 mEq/L (ref 3.5–5.1)
Sodium: 139 mEq/L (ref 135–145)
Total Bilirubin: 0.7 mg/dL (ref 0.2–1.2)
Total Protein: 6.7 g/dL (ref 6.0–8.3)

## 2019-07-04 LAB — CBC WITH DIFFERENTIAL/PLATELET
Basophils Absolute: 0 10*3/uL (ref 0.0–0.1)
Basophils Relative: 1.3 % (ref 0.0–3.0)
Eosinophils Absolute: 0.2 10*3/uL (ref 0.0–0.7)
Eosinophils Relative: 4.1 % (ref 0.0–5.0)
HCT: 42.6 % (ref 36.0–46.0)
Hemoglobin: 14.4 g/dL (ref 12.0–15.0)
Lymphocytes Relative: 44.2 % (ref 12.0–46.0)
Lymphs Abs: 1.7 10*3/uL (ref 0.7–4.0)
MCHC: 33.9 g/dL (ref 30.0–36.0)
MCV: 95.5 fl (ref 78.0–100.0)
Monocytes Absolute: 0.4 10*3/uL (ref 0.1–1.0)
Monocytes Relative: 10 % (ref 3.0–12.0)
Neutro Abs: 1.6 10*3/uL (ref 1.4–7.7)
Neutrophils Relative %: 40.4 % — ABNORMAL LOW (ref 43.0–77.0)
Platelets: 336 10*3/uL (ref 150.0–400.0)
RBC: 4.46 Mil/uL (ref 3.87–5.11)
RDW: 12.6 % (ref 11.5–15.5)
WBC: 3.9 10*3/uL — ABNORMAL LOW (ref 4.0–10.5)

## 2019-07-04 LAB — LIPID PANEL
Cholesterol: 202 mg/dL — ABNORMAL HIGH (ref 0–200)
HDL: 73.5 mg/dL (ref >39.00)
LDL Cholesterol: 116 mg/dL — ABNORMAL HIGH (ref 0–99)
NonHDL: 128.64
Total CHOL/HDL Ratio: 3
Triglycerides: 65 mg/dL (ref 0.0–149.0)
VLDL: 13 mg/dL (ref 0.0–40.0)

## 2019-07-04 MED ORDER — BUPROPION HCL ER (SR) 150 MG PO TB12: 150.0000 mg | ORAL_TABLET | Freq: Two times a day (BID) | ORAL | 1 refills | Status: DC

## 2019-07-04 MED ORDER — AMLODIPINE BESYLATE 2.5 MG PO TABS: 2.5000 mg | ORAL_TABLET | Freq: Every day | ORAL | 1 refills | Status: DC

## 2019-07-04 MED ORDER — SUMATRIPTAN SUCCINATE 100 MG PO TABS: ORAL_TABLET | ORAL | 3 refills | Status: DC

## 2019-07-04 MED ORDER — PANTOPRAZOLE SODIUM 40 MG PO TBEC: 40.0000 mg | DELAYED_RELEASE_TABLET | Freq: Every day | ORAL | 1 refills | Status: DC

## 2019-07-04 NOTE — Patient Instructions (Signed)
-Nice seeing you today!!  -Lab work today; will notify you once results are available.  -Dermatology referral today.  -Consider shingles vaccine 6 weeks out from your last COVID vaccine.  -Check BP at home 3 times a week and contact me once you have 3 weeks worth of data.  -See you back in 3-4 months.   Preventive Care 51 Years and Older, Female Preventive care refers to lifestyle choices and visits with your health care provider that can promote health and wellness. This includes:  A yearly physical exam. This is also called an annual well check.  Regular dental and eye exams.  Immunizations.  Screening for certain conditions.  Healthy lifestyle choices, such as diet and exercise. What can I expect for my preventive care visit? Physical exam Your health care provider will check:  Height and weight. These may be used to calculate body mass index (BMI), which is a measurement that tells if you are at a healthy weight.  Heart rate and blood pressure.  Your skin for abnormal spots. Counseling Your health care provider may ask you questions about:  Alcohol, tobacco, and drug use.  Emotional well-being.  Home and relationship well-being.  Sexual activity.  Eating habits.  History of falls.  Memory and ability to understand (cognition).  Work and work Statistician.  Pregnancy and menstrual history. What immunizations do I need?  Influenza (flu) vaccine  This is recommended every year. Tetanus, diphtheria, and pertussis (Tdap) vaccine  You may need a Td booster every 10 years. Varicella (chickenpox) vaccine  You may need this vaccine if you have not already been vaccinated. Zoster (shingles) vaccine  You may need this after age 49. Pneumococcal conjugate (PCV13) vaccine  One dose is recommended after age 14. Pneumococcal polysaccharide (PPSV23) vaccine  One dose is recommended after age 107. Measles, mumps, and rubella (MMR) vaccine  You may need at  least one dose of MMR if you were born in 1957 or later. You may also need a second dose. Meningococcal conjugate (MenACWY) vaccine  You may need this if you have certain conditions. Hepatitis A vaccine  You may need this if you have certain conditions or if you travel or work in places where you may be exposed to hepatitis A. Hepatitis B vaccine  You may need this if you have certain conditions or if you travel or work in places where you may be exposed to hepatitis B. Haemophilus influenzae type b (Hib) vaccine  You may need this if you have certain conditions. You may receive vaccines as individual doses or as more than one vaccine together in one shot (combination vaccines). Talk with your health care provider about the risks and benefits of combination vaccines. What tests do I need? Blood tests  Lipid and cholesterol levels. These may be checked every 5 years, or more frequently depending on your overall health.  Hepatitis C test.  Hepatitis B test. Screening  Lung cancer screening. You may have this screening every year starting at age 24 if you have a 30-pack-year history of smoking and currently smoke or have quit within the past 15 years.  Colorectal cancer screening. All adults should have this screening starting at age 81 and continuing until age 23. Your health care provider may recommend screening at age 24 if you are at increased risk. You will have tests every 1-10 years, depending on your results and the type of screening test.  Diabetes screening. This is done by checking your blood sugar (glucose) after  you have not eaten for a while (fasting). You may have this done every 1-3 years.  Mammogram. This may be done every 1-2 years. Talk with your health care provider about how often you should have regular mammograms.  BRCA-related cancer screening. This may be done if you have a family history of breast, ovarian, tubal, or peritoneal cancers. Other tests  Sexually  transmitted disease (STD) testing.  Bone density scan. This is done to screen for osteoporosis. You may have this done starting at age 78. Follow these instructions at home: Eating and drinking  Eat a diet that includes fresh fruits and vegetables, whole grains, lean protein, and low-fat dairy products. Limit your intake of foods with high amounts of sugar, saturated fats, and salt.  Take vitamin and mineral supplements as recommended by your health care provider.  Do not drink alcohol if your health care provider tells you not to drink.  If you drink alcohol: ? Limit how much you have to 0-1 drink a day. ? Be aware of how much alcohol is in your drink. In the [place] one drink equals one 12 oz bottle of beer (355 mL), one 5 oz glass of wine (148 mL), or one 1 oz glass of hard liquor (44 mL). Lifestyle  Take daily care of your teeth and gums.  Stay active. Exercise for at least 30 minutes on 5 or more days each week.  Do not use any products that contain nicotine or tobacco, such as cigarettes, e-cigarettes, and chewing tobacco. If you need help quitting, ask your health care provider.  If you are sexually active, practice safe sex. Use a condom or other form of protection in order to prevent STIs (sexually transmitted infections).  Talk with your health care provider about taking a low-dose aspirin or statin. What's next?  Go to your health care provider once a year for a well check visit.  Ask your health care provider how often you should have your eyes and teeth checked.  Stay up to date on all vaccines. This information is not intended to replace advice given to you by your health care provider. Make sure you discuss any questions you have with your health care provider. Document Revised: 03/15/2018 Document Reviewed: 03/15/2018 Elsevier Patient Education  2020 [employer].

## 2019-07-04 NOTE — Progress Notes (Signed)
Established Patient Office Visit     This visit occurred during the SARS-CoV-2 public health emergency.  Safety protocols were in place, including screening questions prior to the visit, additional usage of staff PPE, and extensive cleaning of exam room while observing appropriate contact time as indicated for disinfecting solutions.    CC/Reason for Visit: Subsequent Medicare wellness visit and follow-up chronic conditions  HPI: Lindsay [name] is a 69 y.o. female who is coming in today for the above mentioned reasons. Past Medical History is significant for: History of migraine headaches, hypertension on amlodipine 2.5 mg daily, GERD on as needed PPI therapy, depression/anxiety that has been well controlled on Wellbutrin, carpal tunnel syndrome followed by sports medicine.  She has routine eye and dental care.  She has not noticed any hearing difficulty.  She does not do any dedicated exercising but she does take care of her 54-year-old granddaughter and it is quite labor-intensive.  She had a colonoscopy in 2016, mammogram in November 2020, she had a DEXA scan in 2019.  She has completed both Covid vaccines since I last saw her.  She has several moles that she would like me to look at today.   Past Medical/Surgical History: Past Medical History:  Diagnosis Date  . COMMON MIGRAINE 09/20/2006  . DEPRESSION 06/11/2009  . GERD 06/11/2009  . Hypertension 04/17/2018  . Neck pain    eval with sports medicine in 2016/17  . Osteopenia 04/17/2018    Past Surgical History:  Procedure Laterality Date  . ABDOMINAL HYSTERECTOMY    . CHOLECYSTECTOMY    . OVARIAN CYST REMOVAL    . REPAIR EXTENSOR TENDON HAND    . TONSILLECTOMY      Social History:  reports that she quit smoking about 15 years ago. Her smoking use included cigarettes. She has never used smokeless tobacco. She reports current alcohol use. She reports that she does not use drugs.  Allergies: No Known Allergies  Family History:   Family History  Problem Relation Age of Onset  . Dementia Mother   . Osteoporosis Mother   . Breast cancer Sister   . Heart attack Brother   . Colon cancer Neg Hx   . Esophageal cancer Neg Hx   . Rectal cancer Neg Hx   . Stomach cancer Neg Hx      Current Outpatient Medications:  .  amLODipine (NORVASC) 2.5 MG tablet, Take 1 tablet (2.5 mg total) by mouth daily., Disp: 90 tablet, Rfl: 1 .  buPROPion (WELLBUTRIN SR) 150 MG 12 hr tablet, Take 1 tablet (150 mg total) by mouth 2 (two) times daily., Disp: 180 tablet, Rfl: 1 .  Cholecalciferol (VITAMIN D3) 1000 UNITS capsule, Take 1,000 Units by mouth daily.  , Disp: , Rfl:  .  clindamycin (CLINDAGEL) 1 % gel, Apply topically 2 (two) times daily., Disp: 30 g, Rfl: 3 .  diphenhydramine-acetaminophen (TYLENOL PM) 25-500 MG TABS, Take 1 tablet by mouth at bedtime as needed., Disp: , Rfl:  .  Docusate Sodium (COLACE PO), Take by mouth., Disp: , Rfl:  .  fish oil-omega-3 fatty acids 1000 MG capsule, Take 1 g by mouth daily., Disp: , Rfl:  .  hydrocortisone (ANUSOL-HC) 2.5 % rectal cream, Place 1 application rectally 2 (two) times daily., Disp: 30 g, Rfl: 0 .  MAGNESIUM PO, Take by mouth., Disp: , Rfl:  .  Misc Natural Products (TART CHERRY ADVANCED PO), Take by mouth daily., Disp: , Rfl:  .  niacin 250  MG tablet, Take 250 mg by mouth daily with breakfast.  , Disp: , Rfl:  .  pantoprazole (PROTONIX) 40 MG tablet, Take 1 tablet (40 mg total) by mouth daily., Disp: 90 tablet, Rfl: 1 .  SUMAtriptan (IMITREX) 100 MG tablet, TAKE 1/2-1 TABLET BY MOUTH AT ONSET MIGRAINE,MAY REPEAT ONCE 2HOURS IF NEED,MAX 2DOSES IN 24 HOURS, Disp: 9 tablet, Rfl: 3  Review of Systems:  Constitutional: Denies fever, chills, diaphoresis, appetite change and fatigue.  HEENT: Denies photophobia, eye pain, redness, hearing loss, ear pain, congestion, sore throat, rhinorrhea, sneezing, mouth sores, trouble swallowing, neck pain, neck stiffness and tinnitus.   Respiratory:  Denies SOB, DOE, cough, chest tightness,  and wheezing.   Cardiovascular: Denies chest pain, palpitations and leg swelling.  Gastrointestinal: Denies nausea, vomiting, abdominal pain, diarrhea, constipation, blood in stool and abdominal distention.  Genitourinary: Denies dysuria, urgency, frequency, hematuria, flank pain and difficulty urinating.  Endocrine: Denies: hot or cold intolerance, sweats, changes in hair or nails, polyuria, polydipsia. Musculoskeletal: Denies myalgias, back pain, joint swelling, arthralgias and gait problem.  Skin: Denies pallor, rash and wound.  Neurological: Denies dizziness, seizures, syncope, weakness, light-headedness, numbness and headaches.  Hematological: Denies adenopathy. Easy bruising, personal or family bleeding history  Psychiatric/Behavioral: Denies suicidal ideation, mood changes, confusion, nervousness, sleep disturbance and agitation    Physical Exam: Vitals:   07/04/19 0857  BP: 120/80  Pulse: 76  Temp: (!) 97.3 F (36.3 C)  TempSrc: Temporal  SpO2: 98%  Weight: 133 lb 6.4 oz (60.5 kg)  Height: '5\' 5"'  (1.651 m)    Body mass index is 22.2 kg/m.   Constitutional: NAD, calm, comfortable Eyes: PERRL, lids and conjunctivae normal ENMT: Mucous membranes are moist.  Tympanic membrane is pearly white, no erythema or bulging. Neck: normal, supple, no masses, no thyromegaly Respiratory: clear to auscultation bilaterally, no wheezing, no crackles. Normal respiratory effort. No accessory muscle use.  Cardiovascular: Regular rate and rhythm, no murmurs / rubs / gallops. No extremity edema. 2+ pedal pulses. No carotid bruits.  Abdomen: no tenderness, no masses palpated. No hepatosplenomegaly. Bowel sounds positive.  Musculoskeletal: no clubbing / cyanosis. No joint deformity upper and lower extremities. Good ROM, no contractures. Normal muscle tone.  Skin: no rashes, lesions, ulcers. No induration Neurologic: CN 2-12 grossly intact. Sensation  intact, DTR normal. Strength 5/5 in all 4.  Psychiatric: Normal judgment and insight. Alert and oriented x 3. Normal mood.    Subsequent Medicare wellness visit   1. Risk factors, based on past  M,S,F -cardiovascular disease risk factors include history of hypertension   2.  Physical activities: No dedicated activity   3.  Depression/mood:  Mood is stable, not depressed   4.  Hearing:  No perceived issues   5.  ADL's: Independent in all ADLs   6.  Fall risk:  Low fall risk   7.  Home safety: No problems identified   8.  Height weight, and visual acuity: Height and weight as above, visual acuity is 20/25 with each eye independently and 20/20 with eyes together   9.  Counseling:  Advised to increase exercise time 30 to 45 minutes 3 times a week, advised ambulatory blood pressure monitoring blood pressure follow-up recheck   10. Lab orders based on risk factors: Laboratory update will be reviewed   11. Referral :  None today   12. Care plan:  Follow-up 3 months   13. Cognitive assessment:  No cognitive impairment   14. Screening: Patient provided with a  written and personalized 5-10 year screening schedule in the AVS.   yes   15. Provider List Update:   PCP, ophthalmologist  16. Advance Directives: Full code     Office Visit from 07/04/2019 in [facility] at [facility]  PHQ-9 Total Score  0      Fall Risk  07/04/2019 04/17/2018  Falls in the past year? 1 0  Number falls in past yr: 0 0  Injury with Fall? 1 0     Impression and Plan:  Encounter for preventive health examination -She has routine eye care. -Vaccines are up-to-date with the exception of shingles, she is advised to wait 6 weeks after her last Covid vaccine to obtain these. -Screening labs today. -Healthy lifestyle discussed in detail. -Breast cancer screening is up-to-date. -She had a colonoscopy in 2016 and is a 10-year callback. -She recently started seeing a GYN, Pap smears are being done by  them. -Reorder DEXA scan as it has been since last ordered.  Gastroesophageal reflux disease without esophagitis  - Plan: pantoprazole (PROTONIX) 40 MG tablet which she takes as needed  Migraine without aura and without status migrainosus, not intractable  - Plan: SUMAtriptan (IMITREX) 100 MG tablet -No recent issues with migraines  Essential hypertension  -Blood pressure remains elevated on today's visit to 140/90. -She will do ambulatory blood pressure monitoring and contact me when she has 3 weeks worth of data to determine if we need to augment her antihypertensive therapy.  Retinal hemorrhage of right eye -Followed by ophthalmologist and retina specialist.  Depression with anxiety  - Plan: buPROPion (WELLBUTRIN SR) 150 MG 12 hr tablet   Office Visit from 07/04/2019 in [place] at [facility]  PHQ-9 Total Score  0     -Mood is stable   Osteoporosis screening  - Plan: DG Bone Density  Screening exam for skin cancer/Skin mole  - Plan: Ambulatory referral to Dermatology    Patient Instructions  -Nice seeing you today!!  -Lab work today; will notify you once results are available.  -Dermatology referral today.  -Consider shingles vaccine 6 weeks out from your last COVID vaccine.  -Check BP at home 3 times a week and contact me once you have 3 weeks worth of data.  -See you back in 3-4 months.   Preventive Care 62 Years and Older, Female Preventive care refers to lifestyle choices and visits with your health care provider that can promote health and wellness. This includes:  A yearly physical exam. This is also called an annual well check.  Regular dental and eye exams.  Immunizations.  Screening for certain conditions.  Healthy lifestyle choices, such as diet and exercise. What can I expect for my preventive care visit? Physical exam Your health care provider will check:  Height and weight. These may be used to calculate body mass index (BMI),  which is a measurement that tells if you are at a healthy weight.  Heart rate and blood pressure.  Your skin for abnormal spots. Counseling Your health care provider may ask you questions about:  Alcohol, tobacco, and drug use.  Emotional well-being.  Home and relationship well-being.  Sexual activity.  Eating habits.  History of falls.  Memory and ability to understand (cognition).  Work and work Statistician.  Pregnancy and menstrual history. What immunizations do I need?  Influenza (flu) vaccine  This is recommended every year. Tetanus, diphtheria, and pertussis (Tdap) vaccine  You may need a Td booster every 10 years. Varicella (chickenpox) vaccine  You  may need this vaccine if you have not already been vaccinated. Zoster (shingles) vaccine  You may need this after age 29. Pneumococcal conjugate (PCV13) vaccine  One dose is recommended after age 74. Pneumococcal polysaccharide (PPSV23) vaccine  One dose is recommended after age 89. Measles, mumps, and rubella (MMR) vaccine  You may need at least one dose of MMR if you were born in 1957 or later. You may also need a second dose. Meningococcal conjugate (MenACWY) vaccine  You may need this if you have certain conditions. Hepatitis A vaccine  You may need this if you have certain conditions or if you travel or work in places where you may be exposed to hepatitis A. Hepatitis B vaccine  You may need this if you have certain conditions or if you travel or work in places where you may be exposed to hepatitis B. Haemophilus influenzae type b (Hib) vaccine  You may need this if you have certain conditions. You may receive vaccines as individual doses or as more than one vaccine together in one shot (combination vaccines). Talk with your health care provider about the risks and benefits of combination vaccines. What tests do I need? Blood tests  Lipid and cholesterol levels. These may be checked every 5  years, or more frequently depending on your overall health.  Hepatitis C test.  Hepatitis B test. Screening  Lung cancer screening. You may have this screening every year starting at age 74 if you have a 30-pack-year history of smoking and currently smoke or have quit within the past 15 years.  Colorectal cancer screening. All adults should have this screening starting at age 64 and continuing until age 53. Your health care provider may recommend screening at age 41 if you are at increased risk. You will have tests every 1-10 years, depending on your results and the type of screening test.  Diabetes screening. This is done by checking your blood sugar (glucose) after you have not eaten for a while (fasting). You may have this done every 1-3 years.  Mammogram. This may be done every 1-2 years. Talk with your health care provider about how often you should have regular mammograms.  BRCA-related cancer screening. This may be done if you have a family history of breast, ovarian, tubal, or peritoneal cancers. Other tests  Sexually transmitted disease (STD) testing.  Bone density scan. This is done to screen for osteoporosis. You may have this done starting at age 89. Follow these instructions at home: Eating and drinking  Eat a diet that includes fresh fruits and vegetables, whole grains, lean protein, and low-fat dairy products. Limit your intake of foods with high amounts of sugar, saturated fats, and salt.  Take vitamin and mineral supplements as recommended by your health care provider.  Do not drink alcohol if your health care provider tells you not to drink.  If you drink alcohol: ? Limit how much you have to 0-1 drink a day. ? Be aware of how much alcohol is in your drink. In the [place] one drink equals one 12 oz bottle of beer (355 mL), one 5 oz glass of wine (148 mL), or one 1 oz glass of hard liquor (44 mL). Lifestyle  Take daily care of your teeth and gums.  Stay active.  Exercise for at least 30 minutes on 5 or more days each week.  Do not use any products that contain nicotine or tobacco, such as cigarettes, e-cigarettes, and chewing tobacco. If you need help quitting, ask  your health care provider.  If you are sexually active, practice safe sex. Use a condom or other form of protection in order to prevent STIs (sexually transmitted infections).  Talk with your health care provider about taking a low-dose aspirin or statin. What's next?  Go to your health care provider once a year for a well check visit.  Ask your health care provider how often you should have your eyes and teeth checked.  Stay up to date on all vaccines. This information is not intended to replace advice given to you by your health care provider. Make sure you discuss any questions you have with your health care provider. Document Revised: 03/15/2018 Document Reviewed: 03/15/2018 Elsevier Patient Education  2020 [place], [place] [facility]

## 2019-07-09 ENCOUNTER — Encounter: Payer: Self-pay | Admitting: Internal Medicine

## 2019-07-17 ENCOUNTER — Telehealth: Payer: Self-pay | Admitting: Acute Care

## 2019-07-17 DIAGNOSIS — Z87891 Personal history of nicotine dependence: Secondary | ICD-10-CM

## 2019-07-17 NOTE — Telephone Encounter (Signed)
LMTC x 1  

## 2019-07-17 NOTE — Telephone Encounter (Signed)
Spoke with pt and scheduled SDMV 08/06/19 9:00 with Derl Barrow, NP CT ordered Nothing further needed

## 2019-07-23 ENCOUNTER — Other Ambulatory Visit: Payer: Self-pay

## 2019-07-23 ENCOUNTER — Ambulatory Visit (INDEPENDENT_AMBULATORY_CARE_PROVIDER_SITE_OTHER): Payer: Medicare Other | Admitting: Family Medicine

## 2019-07-23 ENCOUNTER — Encounter: Payer: Self-pay | Admitting: Family Medicine

## 2019-07-23 ENCOUNTER — Ambulatory Visit (INDEPENDENT_AMBULATORY_CARE_PROVIDER_SITE_OTHER): Payer: Medicare Other

## 2019-07-23 ENCOUNTER — Ambulatory Visit
Admission: RE | Admit: 2019-07-23 | Discharge: 2019-07-23 | Disposition: A | Discharge status: Home / Self Care | Payer: Medicare Other | Source: Ambulatory Visit | Attending: Internal Medicine | Admitting: Internal Medicine

## 2019-07-23 VITALS — BP 118/84 | HR 78 | Ht 65.0 in | Wt 134.0 lb

## 2019-07-23 DIAGNOSIS — Z1231 Encounter for screening mammogram for malignant neoplasm of breast: Secondary | ICD-10-CM | POA: Diagnosis not present

## 2019-07-23 DIAGNOSIS — M5030 Other cervical disc degeneration, unspecified cervical region: Secondary | ICD-10-CM | POA: Diagnosis not present

## 2019-07-23 DIAGNOSIS — S8264XA Nondisplaced fracture of lateral malleolus of right fibula, initial encounter for closed fracture: Secondary | ICD-10-CM | POA: Diagnosis not present

## 2019-07-23 DIAGNOSIS — M999 Biomechanical lesion, unspecified: Secondary | ICD-10-CM | POA: Diagnosis not present

## 2019-07-23 DIAGNOSIS — M25571 Pain in right ankle and joints of right foot: Secondary | ICD-10-CM

## 2019-07-23 NOTE — Assessment & Plan Note (Signed)
Patient is healing relatively well.  Patient seems to be doing better and encouraged her to continue to wear the Aircast on a regular basis.  I would actually stay more secondary to the ankle mortise is what I am more concerned about.  We will continue to monitor.  Worsening symptoms with increasing activity will need to consider advanced imaging.  Patient is in agreement with the plan.

## 2019-07-23 NOTE — Assessment & Plan Note (Signed)
   Decision today to treat with OMT was based on Physical Exam  After verbal consent patient was treated with HVLA, ME, FPR techniques in cervical, thoracic, rib areas, all areas are chronic   Patient tolerated the procedure well with improvement in symptoms  Patient given exercises, stretches and lifestyle modifications  See medications in patient instructions if given  Patient will follow up in 6-8 weeks 

## 2019-07-23 NOTE — Patient Instructions (Signed)
Wear ankle brace AS NEEDED See me again in 6 weeks

## 2019-07-23 NOTE — Assessment & Plan Note (Signed)
Chronic problem with exacerbation.  Discussed gabapentin and other medications that has been was prescribed for her previously.  Discussed icing regimen, which activities to doing which wants to avoid.  Follow-up with me again 6 to 8 weeks

## 2019-07-23 NOTE — Progress Notes (Signed)
[place] Phone: [phone] Subjective:   Lindsay [name], am serving as a scribe for Dr. Hulan [name]. This visit occurred during the SARS-CoV-2 public health emergency.  Safety protocols were in place, including screening questions prior to the visit, additional usage of staff PPE, and extensive cleaning of exam room while observing appropriate contact time as indicated for disinfecting solutions.   I'm seeing this patient by the request  of:  Lindsay [name], Lindsay Halsted, MD  CC: Ankle neck pain follow-up  [id number]   06/25/2019 Acute injury with some mild healing noted.  interventions previously, including medication management: Patient given ibuprofen for 3-day burst   Interventions this visit: Transition into the Aircast from cam walker boot patient will go back and forth We discussed with patient the importance ergonomics, home exercises, icing regimen, and over-the-counter natural products.   Future considerations but will be based on evaluation and next visit: Due to the amount of swelling in the ankle mortise will consider the possibility of need of advanced imaging.  Update 07/23/2019 Lindsay [name] is a 69 y.o. female coming in with complaint of right ankle pain. Patient states that she has been working on her ROM. Continues to wear Aircast. Has occasional sharp pain when walking down stairs and stretching in the morning.   Is also here for OMT.  Patient has had some increase in neck pain.  Nothing is too severe that stopping her from activity.  Dizziness and being very uncomfortable at night.  Continues to have some radicular symptoms at all times though.    Past Medical History:  Diagnosis Date  . COMMON MIGRAINE 09/20/2006  . DEPRESSION 06/11/2009  . GERD 06/11/2009  . Hypertension 04/17/2018  . Neck pain    eval with sports medicine in 2016/17  . Osteopenia 04/17/2018   Past Surgical  History:  Procedure Laterality Date  . ABDOMINAL HYSTERECTOMY    . CHOLECYSTECTOMY    . OVARIAN CYST REMOVAL    . REPAIR EXTENSOR TENDON HAND    . TONSILLECTOMY     Social History   Socioeconomic History  . Marital status: Married    Spouse name: Not on file  . Number of children: Not on file  . Years of education: Not on file  . Highest education level: Not on file  Occupational History  . Not on file  Tobacco Use  . Smoking status: Former Smoker    Types: Cigarettes    Quit date: 04/04/2004    Years since quitting: 15.3  . Smokeless tobacco: Never Used  Substance and Sexual Activity  . Alcohol use: Yes    Alcohol/week: 0.0 standard drinks    Comment: occ  . Drug use: [name]  . Sexual activity: Not on file  Other Topics Concern  . Not on file  Social History Narrative  . Not on file   Social Determinants of Health   Financial Resource Strain:   . Difficulty of Paying Living Expenses:   Food Insecurity:   . Worried About Charity fundraiser in the Last Year:   . Arboriculturist in the Last Year:   Transportation Needs:   . Film/video editor (Medical):   Marland Kitchen Lack of Transportation (Non-Medical):   Physical Activity:   . Days of Exercise per Week:   . Minutes of Exercise per Session:   Stress:   . Feeling of Stress :   Social Connections:   .  Frequency of Communication with Friends and Family:   . Frequency of Social Gatherings with Friends and Family:   . Attends Religious Services:   . Active Member of Clubs or Organizations:   . Attends Archivist Meetings:   Marland Kitchen Marital Status:    [name] Known Allergies Family History  Problem Relation Age of Onset  . Dementia Mother   . Osteoporosis Mother   . Breast cancer Sister   . Heart attack Brother   . Colon cancer Neg Hx   . Esophageal cancer Neg Hx   . Rectal cancer Neg Hx   . Stomach cancer Neg Hx      Current Outpatient Medications (Cardiovascular):  .  amLODipine (NORVASC) 2.5 MG tablet, Take 1  tablet (2.5 mg total) by mouth daily.   Current Outpatient Medications (Analgesics):  Marland Kitchen  SUMAtriptan (IMITREX) 100 MG tablet, TAKE 1/2-1 TABLET BY MOUTH AT ONSET MIGRAINE,MAY REPEAT ONCE 2HOURS IF NEED,MAX 2DOSES IN 24 HOURS   Current Outpatient Medications (Other):  Marland Kitchen  buPROPion (WELLBUTRIN SR) 150 MG 12 hr tablet, Take 1 tablet (150 mg total) by mouth 2 (two) times daily. .  Cholecalciferol (VITAMIN D3) 1000 UNITS capsule, Take 1,000 Units by mouth daily.   .  clindamycin (CLINDAGEL) 1 % gel, Apply topically 2 (two) times daily. .  diphenhydramine-acetaminophen (TYLENOL PM) 25-500 MG TABS, Take 1 tablet by mouth at bedtime as needed. .  Docusate Sodium (COLACE PO), Take by mouth. .  fish oil-omega-3 fatty acids 1000 MG capsule, Take 1 g by mouth daily. .  hydrocortisone (ANUSOL-HC) 2.5 % rectal cream, Place 1 application rectally 2 (two) times daily. Marland Kitchen  MAGNESIUM PO, Take by mouth. .  Misc Natural Products (TART CHERRY ADVANCED PO), Take by mouth daily. .  niacin 250 MG tablet, Take 250 mg by mouth daily with breakfast.   .  pantoprazole (PROTONIX) 40 MG tablet, Take 1 tablet (40 mg total) by mouth daily.   Reviewed prior external information including notes and imaging from  primary care provider As well as notes that were available from care everywhere and other healthcare systems.  Past medical history, social, surgical and family history all reviewed in electronic medical record.  [name] pertanent information unless stated regarding to the chief complaint.   Review of Systems:  [name] headache, visual changes, nausea, vomiting, diarrhea, constipation, dizziness, abdominal pain, skin rash, fevers, chills, night sweats, weight loss, swollen lymph nodes, body aches, joint swelling, chest pain, shortness of breath, mood changes. POSITIVE muscle aches  Objective  Blood pressure 118/84, pulse 78, height 5\' 5"  (1.651 m), weight 134 lb (60.8 kg), SpO2 99 %.   General: [name] apparent distress  alert and oriented x3 mood and affect normal, dressed appropriately.  HEENT: Pupils equal, extraocular movements intact  Respiratory: Patient's speak in full sentences and does not appear short of breath  Cardiovascular: [name] lower extremity edema, non tender, [name] erythema  Neuro: Cranial nerves II through XII are intact, neurovascularly intact in all extremities with 2+ DTRs and 2+ pulses.  MSK: Right ankle still shows a trace effusion noted.  Patient continues to have lack of motion in plantar flexion and 5 degrees and 15 degrees of dorsiflexion.  Patient is tender over the talar dome noted.  Patient's pain is improved over the lateral malleolus.  Neck exam does have loss of lordosis.  Tender to palpation in the paraspinal musculature.  Limited side bending and rotation bilaterally of 5 to 10 degrees.  Mild Spurling's with radicular  symptoms noted right side.  Osteopathic findings C2 flexed rotated and side bent right C4 flexed rotated and side bent left C6 flexed rotated and side bent left T3 extended rotated and side bent right inhaled third rib   Limited musculoskeletal ultrasound was performed and interpreted by [name]  Limited ultrasound shows that patient does have effusion noted of the ankle mortise joint.  Cortical irregularity of the talar dome is noted.  Patient's lateral malleolus seems to be healing appropriately.   Impression and Recommendations:     This case required medical decision making of moderate complexity. The above documentation has been reviewed and is accurate and complete [name], DO       Note: This dictation was prepared with Dragon dictation along with smaller phrase technology. Any transcriptional errors that result from this process are unintentional.

## 2019-07-24 ENCOUNTER — Telehealth: Payer: Self-pay | Admitting: Primary Care

## 2019-07-25 ENCOUNTER — Ambulatory Visit: Payer: Medicare Other | Admitting: Family Medicine

## 2019-07-26 NOTE — Telephone Encounter (Signed)
Lynndyl x 1 - Shared Decision visit on 08/06/19 with Derl Barrow, NP is an in person visit.

## 2019-07-31 NOTE — Telephone Encounter (Signed)
ATC patient left message with details. Nothing further needed at this time

## 2019-08-06 ENCOUNTER — Encounter: Payer: Self-pay | Admitting: Primary Care

## 2019-08-06 ENCOUNTER — Ambulatory Visit (INDEPENDENT_AMBULATORY_CARE_PROVIDER_SITE_OTHER): Payer: Medicare Other | Admitting: Primary Care

## 2019-08-06 ENCOUNTER — Ambulatory Visit
Admission: RE | Admit: 2019-08-06 | Discharge: 2019-08-06 | Disposition: A | Discharge status: Home / Self Care | Payer: Medicare Other | Source: Ambulatory Visit | Attending: Acute Care | Admitting: Acute Care

## 2019-08-06 ENCOUNTER — Other Ambulatory Visit: Payer: Self-pay

## 2019-08-06 VITALS — BP 140/90 | HR 76 | Temp 98.2°F

## 2019-08-06 DIAGNOSIS — Z87891 Personal history of nicotine dependence: Secondary | ICD-10-CM

## 2019-08-06 NOTE — Progress Notes (Signed)
Shared Decision Making Visit Lung Cancer Screening Program [id number])   Eligibility:  Age 69 y.o.  Pack Years Smoking History Calculation 30 (# packs/per year x # years smoked)  Recent History of coughing up blood  no  Unexplained weight loss? no ( >Than 15 pounds within the last 6 months )  Prior History Lung / other cancer no (Diagnosis within the last 5 years already requiring surveillance chest CT Scans).  Smoking Status Former Smoker  Former Smokers: Years since quit: 14 years  Quit Date: 2006  Visit Components:  Discussion included one or more decision making aids. yes  Discussion included risk/benefits of screening. yes  Discussion included potential follow up diagnostic testing for abnormal scans. yes  Discussion included meaning and risk of over diagnosis. yes  Discussion included meaning and risk of False Positives. yes  Discussion included meaning of total radiation exposure. yes  Counseling Included:  Importance of adherence to annual lung cancer LDCT screening. yes  Impact of comorbidities on ability to participate in the program. yes  Ability and willingness to under diagnostic treatment. yes  Smoking Cessation Counseling:  Former Smokers:   Discussed the importance of maintaining cigarette abstinence. yes  Diagnosis Code: Personal History of Nicotine Dependence. [id number]  Information about tobacco cessation classes and interventions provided to patient. Yes  Patient provided with "ticket" for LDCT Scan. yes  Written Order for Lung Cancer Screening with LDCT placed in Epic. Yes (CT Chest Lung Cancer Screening Low Dose W/O CM) [id number] Z12.2-Screening of respiratory organs Z87.891-Personal history of nicotine dependence  BP 140/90 (BP Location: Right Leg, Cuff Size: Normal)   Pulse 76   Temp 98.2 F (36.8 C) (Temporal)   SpO2 100%    I have spent 25 minutes of face to face time with Ms. Heinke discussing the risks and benefits of lung cancer  screening. We viewed a power point together that explained in detail the above noted topics. We paused at intervals to allow for questions to be asked and answered to ensure understanding.We discussed that the single most powerful action that she can take to decrease her risk of developing lung cancer is to quit smoking. I have also given her my card and contact information in the event she needs to contact me. We discussed the time and location of the scan, and that either Doroteo Glassman RN or I will call with the results within 24-48 hours of receiving them. I have offered her  a copy of the power point we viewed  as a resource in the event they need reinforcement of the concepts we discussed today in the office. The patient verbalized understanding of all of  the above and had no further questions upon leaving the office. They have my contact information in the event they have any further questions.   I explained to the patient that there has been a high incidence of coronary artery disease noted on these exams. I explained that this is a non-gated exam therefore degree or severity cannot be determined. This patient is not on statin therapy. I have asked the patient to follow-up with their PCP regarding any incidental finding of coronary artery disease and management with diet or medication as their PCP  feels is clinically indicated. The patient verbalized understanding of the above and had no further questions upon completion of the visit.  BP is slightly elevated today, she takes Norvasc 2.5mg  daily in the morning. She is keeping log for 3 weeks and will follow-up with PCP.  Patient is aware she may only qualify for this one LDCT as she quit smoking 14-15 years ago. If lung RADS 1 no follow-up needed, if Lung RADS 2 would likely recommend repeat CT Chest wo contrast in 12 months. Patient agrees and understands.   Martyn Ehrich, NP

## 2019-08-06 NOTE — Patient Instructions (Signed)
Thank you for participating in the [facility]. It was our pleasure to meet you today. We will call you with the results of your scan within the next few days. Your scan will be assigned a Lung RADS category score by the physicians reading the scans.  This Lung RADS score determines follow up scanning.  See below for description of categories, and follow up screening recommendations. We will be in touch to schedule your follow up screening annually or based on recommendations of our providers. We will fax a copy of your scan results to your Primary Care Physician, or the physician who referred you to the program, to ensure they have the results. Please call the office if you have any questions or concerns regarding your scanning experience or results.  Our office number is [phone]. Please speak with Denise Phelps, RN. She is our Lung Cancer Screening RN. If she is unavailable when you call, please have the office staff send her a message. She will return your call at her earliest convenience. Remember, if your scan is normal, we will scan you annually as long as you continue to meet the criteria for the program. (Age 55-77, Current smoker or smoker who has quit within the last 15 years). If you are a smoker, remember, quitting is the single most powerful action that you can take to decrease your risk of lung cancer and other pulmonary, breathing related problems. We know quitting is hard, and we are here to help.  Please let us know if there is anything we can do to help you meet your goal of quitting. If you are a former smoker, congratulations. We are proud of you! Remain smoke free! Remember you can refer friends or family members through the number above.  We will screen them to make sure they meet criteria for the program. Thank you for helping us take better care of you by participating in Lung Screening.  Lung RADS Categories:  Lung RADS 1: no nodules  or definitely non-concerning nodules.  Recommendation is for a repeat annual scan in 12 months.  Lung RADS 2:  nodules that are non-concerning in appearance and behavior with a very low likelihood of becoming an active cancer. Recommendation is for a repeat annual scan in 12 months.  Lung RADS 3: nodules that are probably non-concerning , includes nodules with a low likelihood of becoming an active cancer.  Recommendation is for a 6-month repeat screening scan. Often noted after an upper respiratory illness. We will be in touch to make sure you have no questions, and to schedule your 6-month scan.  Lung RADS 4 A: nodules with concerning findings, recommendation is most often for a follow up scan in 3 months or additional testing based on our provider's assessment of the scan. We will be in touch to make sure you have no questions and to schedule the recommended 3 month follow up scan.  Lung RADS 4 B:  indicates findings that are concerning. We will be in touch with you to schedule additional diagnostic testing based on our provider's  assessment of the scan.   

## 2019-08-08 NOTE — Progress Notes (Signed)
Please call patient and let them  know their  low dose Ct was read as a Lung RADS 2: nodules that are benign in appearance and behavior with a very low likelihood of becoming a clinically active cancer due to size or lack of growth. Recommendation per radiology is for a repeat LDCT in 12 months. .Please let them  know we will order and schedule their  annual screening scan for 08/2020. Please let them  know there was notation of CAD on their  scan.  Please remind the patient  that this is a non-gated exam therefore degree or severity of disease  cannot be determined. Please have them  follow up with their PCP regarding potential risk factor modification, dietary therapy or pharmacologic therapy if clinically indicated. Pt.  is  currently on statin therapy. Please place order for annual  screening scan for  08/2020 and fax results to PCP. Thanks so much. 

## 2019-08-09 ENCOUNTER — Other Ambulatory Visit: Payer: Self-pay | Admitting: *Deleted

## 2019-08-09 DIAGNOSIS — Z87891 Personal history of nicotine dependence: Secondary | ICD-10-CM

## 2019-08-13 ENCOUNTER — Encounter: Payer: Self-pay | Admitting: Internal Medicine

## 2019-08-27 ENCOUNTER — Other Ambulatory Visit: Payer: Self-pay

## 2019-08-27 ENCOUNTER — Ambulatory Visit (INDEPENDENT_AMBULATORY_CARE_PROVIDER_SITE_OTHER): Payer: Medicare Other | Admitting: Physician Assistant

## 2019-08-27 ENCOUNTER — Encounter: Payer: Self-pay | Admitting: Physician Assistant

## 2019-08-27 DIAGNOSIS — L820 Inflamed seborrheic keratosis: Secondary | ICD-10-CM

## 2019-08-27 DIAGNOSIS — D485 Neoplasm of uncertain behavior of skin: Secondary | ICD-10-CM

## 2019-08-27 DIAGNOSIS — L570 Actinic keratosis: Secondary | ICD-10-CM | POA: Diagnosis not present

## 2019-08-27 DIAGNOSIS — D1809 Hemangioma of other sites: Secondary | ICD-10-CM | POA: Diagnosis not present

## 2019-08-27 DIAGNOSIS — Z1283 Encounter for screening for malignant neoplasm of skin: Secondary | ICD-10-CM | POA: Diagnosis not present

## 2019-08-27 NOTE — Progress Notes (Signed)
   New Patient   Subjective  Lindsay [name] is a 69 y.o. female who presents for the following: Annual Exam (right wrist-scaly spot, back-dark spot).   The following portions of the chart were reviewed this encounter and updated as appropriate: Tobacco  Allergies  Meds  Problems  Med Hx  Surg Hx  Fam Hx      Objective  Well appearing patient in no apparent distress; mood and affect are within normal limits.  A full examination was performed including scalp, head, eyes, ears, nose, lips, neck, chest, axillae, abdomen, back, buttocks, bilateral upper extremities, bilateral lower extremities, hands, feet, fingers, toes, fingernails, and toenails. All findings within normal limits unless otherwise noted below.  Objective  Right Wrist - Posterior: Erythematous patches with gritty scale.Erythematous patches with gritty scale.  Objective  right inner Knee - Anterior: Red papule     Objective  Left Thigh - Anterior, Right Buccal Cheek , Right Shoulder - Anterior: Brown crust   Assessment & Plan  AK (actinic keratosis) Right Wrist - Posterior  Destruction of lesion - Right Wrist - Posterior Complexity: simple   Destruction method: cryotherapy   Informed consent: discussed and consent obtained   Timeout:  patient name, date of birth, surgical site, and procedure verified Lesion destroyed using liquid nitrogen: Yes   Cryotherapy cycles:  1 Outcome: patient tolerated procedure well with no complications   Post-procedure details: wound care instructions given    Neoplasm of uncertain behavior of skin right inner Knee - Anterior  Skin / nail biopsy Type of biopsy: tangential   Informed consent: discussed and consent obtained   Timeout: patient name, date of birth, surgical site, and procedure verified   Anesthesia: the lesion was anesthetized in a standard fashion   Anesthetic:  1% lidocaine w/ epinephrine 1-100,000 local infiltration Instrument used: flexible razor blade    Hemostasis achieved with: aluminum chloride and electrodesiccation   Outcome: patient tolerated procedure well   Post-procedure details: wound care instructions given    Specimen 1 - Surgical pathology Differential Diagnosis: hemangioma Check Margins: No  Screening exam for skin cancer Head - Anterior (Face)  Seborrheic keratosis, inflamed (3) Right Shoulder - Anterior; Left Thigh - Anterior; Right Buccal Cheek     Destruction of lesion - Left Thigh - Anterior, Right Buccal Cheek , Right Shoulder - Anterior Complexity: simple   Destruction method: cryotherapy   Informed consent: discussed and consent obtained   Timeout:  patient name, date of birth, surgical site, and procedure verified Lesion destroyed using liquid nitrogen: Yes   Cryotherapy cycles:  1 Outcome: patient tolerated procedure well with no complications   Post-procedure details: wound care instructions given

## 2019-08-27 NOTE — Patient Instructions (Signed)

## 2019-08-28 ENCOUNTER — Encounter: Payer: Self-pay | Admitting: Physician Assistant

## 2019-09-03 ENCOUNTER — Encounter: Payer: Self-pay | Admitting: Family Medicine

## 2019-09-03 ENCOUNTER — Ambulatory Visit (INDEPENDENT_AMBULATORY_CARE_PROVIDER_SITE_OTHER): Payer: Medicare Other | Admitting: Family Medicine

## 2019-09-03 ENCOUNTER — Other Ambulatory Visit: Payer: Self-pay

## 2019-09-03 VITALS — BP 136/80 | HR 62 | Ht 65.0 in | Wt 133.0 lb

## 2019-09-03 DIAGNOSIS — M5030 Other cervical disc degeneration, unspecified cervical region: Secondary | ICD-10-CM | POA: Diagnosis not present

## 2019-09-03 DIAGNOSIS — M999 Biomechanical lesion, unspecified: Secondary | ICD-10-CM

## 2019-09-03 NOTE — Patient Instructions (Addendum)
Good to see you  New stretch See me again in 6 weeks

## 2019-09-03 NOTE — Progress Notes (Signed)
[facility] Phone: [phone] Subjective:   I, Lindsay [name], am serving as a scribe for Dr. Hulan Saas This visit occurred during the SARS-CoV-2 public health emergency.  Safety protocols were in place, including screening questions prior to the visit, additional usage of staff PPE, and extensive cleaning of exam room while observing appropriate contact time as indicated for disinfecting solutions.  I'm seeing this patient by the request  of:  Isaac Bliss, Rayford Halsted, MD  CC: Back and neck pain follow-up  [id number]   07/23/2019 Patient is healing relatively well.  Patient seems to be doing better and encouraged her to continue to wear the Aircast on a regular basis.  I would actually stay more secondary to the ankle mortise is what I am more concerned about.  We will continue to monitor.  Worsening symptoms with increasing activity will need to consider advanced imaging.  Patient is in agreement with the plan.  Update 09/03/2019 Lindsay [name] is a 69 y.o. female coming in with complaint of back and neck pain and right ankle pain. Patient states the ankle is doing well. Believes she is making progress.   Medications patient has been prescribed: Patient has been prescribed anti-inflammatories as well as gabapentin.  Taking: Occasionally.     Feels the neck is somewhat tighter than usual.  Patient denies though any of the radicular symptoms she has had.  Patient does also have carpal tunnel but states that this is not causing as much concern at the moment.    Reviewed prior external information including notes and imaging from previsou exam, outside providers and external EMR if available.   As well as notes that were available from care everywhere and other healthcare systems.  Past medical history, social, surgical and family history all reviewed in electronic medical record.  No pertanent information unless  stated regarding to the chief complaint.   Past Medical History:  Diagnosis Date  . COMMON MIGRAINE 09/20/2006  . DEPRESSION 06/11/2009  . GERD 06/11/2009  . Hypertension 04/17/2018  . Neck pain    eval with sports medicine in 2016/17  . Osteopenia 04/17/2018    No Known Allergies   Review of Systems:  No headache, visual changes, nausea, vomiting, diarrhea, constipation, dizziness, abdominal pain, skin rash, fevers, chills, night sweats, weight loss, swollen lymph nodes, body aches, joint swelling, chest pain, shortness of breath, mood changes. POSITIVE muscle aches  Objective  Blood pressure 136/80, pulse 62, height 5\' 5"  (1.651 m), weight 133 lb (60.3 kg), SpO2 98 %.   General: No apparent distress alert and oriented x3 mood and affect normal, dressed appropriately.  HEENT: Pupils equal, extraocular movements intact  Respiratory: Patient's speak in full sentences and does not appear short of breath  Cardiovascular: No lower extremity edema, non tender, no erythema  Neuro: Cranial nerves II through XII are intact, neurovascularly intact in all extremities with 2+ DTRs and 2+ pulses.  Gait normal with good balance and coordination.  MSK:   Neck exam loss of lordosis.  Patient does have mild radicular symptoms with Spurling's. Osteopathic findings  C2 flexed rotated and side bent right C6 flexed rotated and side bent left T4 extended rotated and side bent right inhaled rib T7 extended rotated and side bent left        Assessment and Plan:  Degenerative disc disease cervical-moderate to severe.  Patient is doing relatively well at this time.  We will continue  to monitor.  Patient does have chronic issues.  Discussed medications but nothing new prescribed today.  Responding fairly well to manipulation.  Right carpal tunnel seems to be stable and will continue to monitor   Lateral malleolus fracture seems to be doing well and patient is able to get out of the brace at this  moment.  Worsening symptoms advanced imaging may be warranted  Nonallopathic problems  Decision today to treat with OMT was based on Physical Exam  After verbal consent patient was treated with HVLA, ME, FPR techniques in cervical, rib, thoracic, lumbar, and sacral  areas  Patient tolerated the procedure well with improvement in symptoms  Patient given exercises, stretches and lifestyle modifications  See medications in patient instructions if given  Patient will follow up in 4-8 weeks      The above documentation has been reviewed and is accurate and complete [name], DO       Note: This dictation was prepared with Dragon dictation along with smaller phrase technology. Any transcriptional errors that result from this process are unintentional.

## 2019-09-10 ENCOUNTER — Encounter: Payer: Self-pay | Admitting: Internal Medicine

## 2019-09-10 ENCOUNTER — Other Ambulatory Visit: Payer: Self-pay | Admitting: *Deleted

## 2019-09-10 ENCOUNTER — Other Ambulatory Visit: Payer: Self-pay | Admitting: Internal Medicine

## 2019-09-10 DIAGNOSIS — I10 Essential (primary) hypertension: Secondary | ICD-10-CM

## 2019-09-10 MED ORDER — AMLODIPINE BESYLATE 5 MG PO TABS: 5.0000 mg | ORAL_TABLET | Freq: Every day | ORAL | 1 refills | Status: DC

## 2019-09-10 NOTE — Telephone Encounter (Signed)
Rx re-sent via escribe due to being printed.

## 2019-09-11 ENCOUNTER — Encounter: Payer: Self-pay | Admitting: Family Medicine

## 2019-09-24 ENCOUNTER — Encounter (HOSPITAL_COMMUNITY): Payer: Self-pay

## 2019-09-24 ENCOUNTER — Emergency Department (HOSPITAL_COMMUNITY): Payer: Medicare Other

## 2019-09-24 ENCOUNTER — Encounter (HOSPITAL_COMMUNITY): Payer: Self-pay | Admitting: Emergency Medicine

## 2019-09-24 ENCOUNTER — Emergency Department (HOSPITAL_COMMUNITY)
Admission: EM | Admit: 2019-09-24 | Discharge: 2019-09-24 | Disposition: A | Discharge status: Home / Self Care | Payer: Medicare Other | Attending: Emergency Medicine | Admitting: Emergency Medicine

## 2019-09-24 ENCOUNTER — Ambulatory Visit (INDEPENDENT_AMBULATORY_CARE_PROVIDER_SITE_OTHER)
Admission: EM | Admit: 2019-09-24 | Discharge: 2019-09-24 | Disposition: A | Discharge status: Home / Self Care | Payer: Medicare Other | Source: Home / Self Care

## 2019-09-24 ENCOUNTER — Other Ambulatory Visit: Payer: Self-pay

## 2019-09-24 DIAGNOSIS — R1013 Epigastric pain: Secondary | ICD-10-CM | POA: Diagnosis not present

## 2019-09-24 DIAGNOSIS — R1110 Vomiting, unspecified: Secondary | ICD-10-CM | POA: Insufficient documentation

## 2019-09-24 DIAGNOSIS — Z79899 Other long term (current) drug therapy: Secondary | ICD-10-CM | POA: Diagnosis not present

## 2019-09-24 DIAGNOSIS — I10 Essential (primary) hypertension: Secondary | ICD-10-CM | POA: Diagnosis not present

## 2019-09-24 DIAGNOSIS — Z87891 Personal history of nicotine dependence: Secondary | ICD-10-CM | POA: Insufficient documentation

## 2019-09-24 DIAGNOSIS — R072 Precordial pain: Secondary | ICD-10-CM | POA: Insufficient documentation

## 2019-09-24 DIAGNOSIS — K2970 Gastritis, unspecified, without bleeding: Secondary | ICD-10-CM

## 2019-09-24 DIAGNOSIS — R0789 Other chest pain: Secondary | ICD-10-CM | POA: Diagnosis present

## 2019-09-24 DIAGNOSIS — K2900 Acute gastritis without bleeding: Secondary | ICD-10-CM | POA: Diagnosis not present

## 2019-09-24 DIAGNOSIS — R079 Chest pain, unspecified: Secondary | ICD-10-CM | POA: Diagnosis not present

## 2019-09-24 LAB — COMPREHENSIVE METABOLIC PANEL
ALT: 32 U/L (ref 0–44)
AST: 40 U/L (ref 15–41)
Albumin: 4.2 g/dL (ref 3.5–5.0)
Alkaline Phosphatase: 69 U/L (ref 38–126)
Anion gap: 13 (ref 5–15)
BUN: 20 mg/dL (ref 8–23)
CO2: 21 mmol/L — ABNORMAL LOW (ref 22–32)
Calcium: 9.3 mg/dL (ref 8.9–10.3)
Chloride: 105 mmol/L (ref 98–111)
Creatinine, Ser: 0.91 mg/dL (ref 0.44–1.00)
GFR calc Af Amer: >60 mL/min (ref >60)
GFR calc non Af Amer: >60 mL/min (ref >60)
Glucose, Bld: 102 mg/dL — ABNORMAL HIGH (ref 70–99)
Potassium: 3.5 mmol/L (ref 3.5–5.1)
Sodium: 139 mmol/L (ref 135–145)
Total Bilirubin: 1 mg/dL (ref 0.3–1.2)
Total Protein: 6.7 g/dL (ref 6.5–8.1)

## 2019-09-24 LAB — CBC
HCT: 45.2 % (ref 36.0–46.0)
Hemoglobin: 15.3 g/dL — ABNORMAL HIGH (ref 12.0–15.0)
MCH: 31.5 pg (ref 26.0–34.0)
MCHC: 33.8 g/dL (ref 30.0–36.0)
MCV: 93 fL (ref 80.0–100.0)
Platelets: 318 10*3/uL (ref 150–400)
RBC: 4.86 MIL/uL (ref 3.87–5.11)
RDW: 12.5 % (ref 11.5–15.5)
WBC: 10.1 10*3/uL (ref 4.0–10.5)
nRBC: 0 % (ref 0.0–0.2)

## 2019-09-24 LAB — TROPONIN I (HIGH SENSITIVITY)
Troponin I (High Sensitivity): 4 ng/L (ref <18)
Troponin I (High Sensitivity): 4 ng/L (ref <18)

## 2019-09-24 LAB — LIPASE, BLOOD: Lipase: 24 U/L (ref 11–51)

## 2019-09-24 MED ORDER — FAMOTIDINE IN NACL 20-0.9 MG/50ML-% IV SOLN
20.0000 mg | Freq: Once | INTRAVENOUS | Status: AC
  Administered 2019-09-24: 20 mg via INTRAVENOUS
  Filled 2019-09-24: qty 50

## 2019-09-24 MED ORDER — IBUPROFEN 800 MG PO TABS
800.0000 mg | ORAL_TABLET | Freq: Once | ORAL | Status: AC
  Administered 2019-09-24: 800 mg via ORAL
  Filled 2019-09-24: qty 1

## 2019-09-24 MED ORDER — SODIUM CHLORIDE 0.9 % IV BOLUS
1000.0000 mL | Freq: Once | INTRAVENOUS | Status: AC
  Administered 2019-09-24: 1000 mL via INTRAVENOUS

## 2019-09-24 MED ORDER — ONDANSETRON HCL 4 MG/2ML IJ SOLN
4.0000 mg | Freq: Once | INTRAMUSCULAR | Status: AC
  Administered 2019-09-24: 4 mg via INTRAVENOUS
  Filled 2019-09-24: qty 2

## 2019-09-24 MED ORDER — ACETAMINOPHEN 325 MG PO TABS
650.0000 mg | ORAL_TABLET | Freq: Once | ORAL | Status: AC
  Administered 2019-09-24: 650 mg via ORAL
  Filled 2019-09-24: qty 2

## 2019-09-24 MED ORDER — ONDANSETRON 4 MG PO TBDP: ORAL_TABLET | ORAL | 0 refills | Status: DC

## 2019-09-24 MED ORDER — SODIUM CHLORIDE 0.9% FLUSH
3.0000 mL | Freq: Once | INTRAVENOUS | Status: AC
  Administered 2019-09-24: 3 mL via INTRAVENOUS

## 2019-09-24 MED ORDER — ONDANSETRON HCL 4 MG PO TABS
4.0000 mg | ORAL_TABLET | Freq: Once | ORAL | Status: AC
  Administered 2019-09-24: 4 mg via ORAL
  Filled 2019-09-24: qty 1

## 2019-09-24 NOTE — ED Notes (Signed)
Patient is being discharged from the Urgent Care and sent to the Emergency Department via POV with family. Per Alyse Low, NP, patient is in need of higher level of care due to active CP with emesis Patient is aware and verbalizes understanding of plan of care.  Vitals:   09/24/19 0859  BP: 133/80  Pulse: 76  Resp: 19  Temp: 98.1 F (36.7 C)  SpO2: 100%

## 2019-09-24 NOTE — ED Notes (Signed)
RN spoke with MD Darl Householder , made aware of pt temp, medication ordered, pt will be discharged as plan after medication administration.

## 2019-09-24 NOTE — ED Triage Notes (Signed)
Patient arrives to ED with complaints of epigastric pain and left sided chest pain since 0100 this morning. Patient states that the pain kept her up all night. Patient states she has been vomiting x3 since 0630 this morning.

## 2019-09-24 NOTE — ED Triage Notes (Signed)
Pt c/o acute mid-epigastric discomfort and left chest pain radiating to left scapula. Pt states that the initial central discomfort woke her up at approx 0100 this morning, radiated to left chest; vomited twice at approx 0630. Denies diaphoresis, dizziness, SOB. Pt states she took two tums and burning to left chest eased but "it took some time". Pt states that now it feels like "the [facility] punched me in my chest" at the sternal area. Denies weakness to arms/legs, changes in vision or speech.  Grips equal/strong; smile symmetrical, leg strength good/equal.

## 2019-09-24 NOTE — ED Provider Notes (Addendum)
[place]    CSN: [id number] Arrival date & time: 09/24/19  0845      History   Chief Complaint Chief Complaint  Patient presents with  . Chest Pain  . Emesis    HPI Lindsay [name] is a 69 y.o. female.   Pt reports unable to sleep due to chest pain,  Pt vomited x 2.  Pt reports pain radiated to her left scapula.  Pt still has pain but not as severe.   The history is provided by the patient. No language interpreter was used.  Chest Pain Pain location:  Substernal area and L chest Pain quality: aching   Associated symptoms: vomiting   Emesis   Past Medical History:  Diagnosis Date  . COMMON MIGRAINE 09/20/2006  . DEPRESSION 06/11/2009  . GERD 06/11/2009  . Hypertension 04/17/2018  . Neck pain    eval with sports medicine in 2016/17  . Osteopenia 04/17/2018    Patient Active Problem List   Diagnosis Date Noted  . Nondisplaced fracture of lateral malleolus of right fibula, initial encounter for closed fracture 06/11/2019  . Depression with anxiety 09/20/2018  . Right carpal tunnel syndrome 08/03/2018  . Retinal hemorrhage 05/02/2018  . Hypertension 04/17/2018  . Osteopenia 04/17/2018  . Abnormal mammogram 07/14/2015  . Degeneration of lateral meniscus of right knee 05/05/2015  . Degenerative cervical disc 12/03/2014  . Nonallopathic lesion of cervical region 12/03/2014  . Nonallopathic lesion of thoracic region 12/03/2014  . Nonallopathic lesion-rib cage 12/03/2014  . GERD 06/11/2009  . NECK PAIN 07/21/2008  . Migraine without aura 09/20/2006    Past Surgical History:  Procedure Laterality Date  . ABDOMINAL HYSTERECTOMY    . CHOLECYSTECTOMY    . OVARIAN CYST REMOVAL    . REPAIR EXTENSOR TENDON HAND    . TONSILLECTOMY      OB History   No obstetric history on file.      Home Medications    Prior to Admission medications   Medication Sig Start Date End Date Taking? Authorizing Provider  amLODipine (NORVASC) 5 MG tablet Take 1 tablet (5 mg  total) by mouth daily. 09/10/19  Yes Isaac Bliss, Rayford Halsted, MD  buPROPion [facility] SR) 150 MG 12 hr tablet Take 1 tablet (150 mg total) by mouth 2 (two) times daily. 07/04/19  Yes Isaac Bliss, Rayford Halsted, MD  Cholecalciferol (VITAMIN D3) 1000 UNITS capsule Take 1,000 Units by mouth daily.     Yes [provider]  MAGNESIUM PO Take by mouth.   Yes [provider]  niacin 250 MG tablet Take 250 mg by mouth daily with breakfast.     Yes [provider]  diphenhydramine-acetaminophen (TYLENOL PM) 25-500 MG TABS Take 1 tablet by mouth at bedtime as needed.    [provider]  Docusate Sodium (COLACE PO) Take by mouth.    [provider]  fish oil-omega-3 fatty acids 1000 MG capsule Take 1 g by mouth daily.    [provider]  hydrocortisone (ANUSOL-HC) 2.5 % rectal cream Place 1 application rectally 2 (two) times daily. 07/06/18   Caren Macadam, MD  Misc Natural Products (TART CHERRY ADVANCED PO) Take by mouth daily.    [provider]  OVER THE COUNTER MEDICATION Take 1 tablet by mouth daily. Tart cherry    [provider]  pantoprazole (PROTONIX) 40 MG tablet Take 1 tablet (40 mg total) by mouth daily. 07/04/19   Isaac Bliss, Rayford Halsted, MD  SUMAtriptan Dellis Filbert)  100 MG tablet TAKE 1/2-1 TABLET BY MOUTH AT ONSET MIGRAINE,MAY REPEAT ONCE 2HOURS IF NEED,MAX 2DOSES IN 24 HOURS 07/04/19   Isaac Bliss, Rayford Halsted, MD    Family History Family History  Problem Relation Age of Onset  . Dementia Mother   . Osteoporosis Mother   . Breast cancer Sister   . Heart attack Brother   . Colon cancer Neg Hx   . Esophageal cancer Neg Hx   . Rectal cancer Neg Hx   . Stomach cancer Neg Hx     Social History Social History   Tobacco Use  . Smoking status: Former Smoker    Types: Cigarettes    Quit date: 04/04/2004    Years since quitting: 15.4  . Smokeless tobacco: Never Used  Vaping Use  . Vaping Use: Never used    Substance Use Topics  . Alcohol use: Yes    Alcohol/week: 0.0 standard drinks    Comment: occ  . Drug use: No     Allergies   Patient has no known allergies.   Review of Systems Review of Systems  Cardiovascular: Positive for chest pain.  Gastrointestinal: Positive for vomiting.  All other systems reviewed and are negative.    Physical Exam Triage Vital Signs ED Triage Vitals  Enc Vitals Group     BP 09/24/19 0859 133/80     Pulse Rate 09/24/19 0859 76     Resp 09/24/19 0859 19     Temp 09/24/19 0859 98.1 F (36.7 C)     Temp Source 09/24/19 0859 Oral     SpO2 09/24/19 0859 100 %     Weight --      Height --      Head Circumference --      Peak Flow --      Pain Score 09/24/19 0903 3     Pain Loc --      Pain Edu? --      Excl. in [place]? --    No data found.  Updated Vital Signs BP 133/80 (BP Location: Left Arm)   Pulse 76   Temp 98.1 F (36.7 C) (Oral)   Resp 19   SpO2 100%   Visual Acuity Right Eye Distance:   Left Eye Distance:   Bilateral Distance:    Right Eye Near:   Left Eye Near:    Bilateral Near:     Physical Exam Vitals and nursing note reviewed.  Constitutional:      Appearance: She is well-developed.  HENT:     Head: Normocephalic.  Cardiovascular:     Heart sounds: Normal heart sounds.  Pulmonary:     Effort: Pulmonary effort is normal.  Skin:    General: Skin is warm.  Neurological:     General: No focal deficit present.     Mental Status: She is alert.      UC Treatments / Results  Labs (all labs ordered are listed, but only abnormal results are displayed) Labs Reviewed - No data to display  EKG   Radiology No results found.  Procedures Procedures (including critical care time)  Medications Ordered in UC Medications - No data to display  Initial Impression / Assessment and Plan / UC Course  I have reviewed the triage vital signs and the nursing notes.  Pertinent labs & imaging results that were available  during my care of the patient were reviewed by me and considered in my medical decision making (see chart for details).  EKG: normal sinus low qrs, vent rate is 76, pr 132, qrs 86, qt 441    EKG  No stemi,  Pt actively having chest pain.  Pt to ED now for evaluation  Final Clinical Impressions(s) / UC Diagnoses   Final diagnoses:  Chest pain, unspecified type   Discharge Instructions   None    ED Prescriptions    None     PDMP not reviewed this encounter.   Fransico Meadow, PA-C 09/24/19 0916    Fransico Meadow, PA-C 09/24/19 [phone]

## 2019-09-24 NOTE — ED Provider Notes (Signed)
[facility] EMERGENCY DEPARTMENT Provider Note   CSN: [id number] Arrival date & time: 09/24/19  0737     History Chief Complaint  Patient presents with  . Chest Pain  . Abdominal Pain    Lindsay [name] is a 69 y.o. female hx of HTN, depression, presenting with vomiting and chest pain.  Patient states that she woke up in the middle of the night with epigastric and left-sided chest pain.  She states that she has a history of reflux and takes Prilosec occasionally.  She took a dose of Prilosec with no relief.  Patient vomited several times since this morning.  She initially went to urgent care was sent here for further evaluation.  Patient denies any history of heart problems.  Denies any stents in her heart.  The history is provided by the patient.       Past Medical History:  Diagnosis Date  . COMMON MIGRAINE 09/20/2006  . DEPRESSION 06/11/2009  . GERD 06/11/2009  . Hypertension 04/17/2018  . Neck pain    eval with sports medicine in 2016/17  . Osteopenia 04/17/2018    Patient Active Problem List   Diagnosis Date Noted  . Nondisplaced fracture of lateral malleolus of right fibula, initial encounter for closed fracture 06/11/2019  . Depression with anxiety 09/20/2018  . Right carpal tunnel syndrome 08/03/2018  . Retinal hemorrhage 05/02/2018  . Hypertension 04/17/2018  . Osteopenia 04/17/2018  . Abnormal mammogram 07/14/2015  . Degeneration of lateral meniscus of right knee 05/05/2015  . Degenerative cervical disc 12/03/2014  . Nonallopathic lesion of cervical region 12/03/2014  . Nonallopathic lesion of thoracic region 12/03/2014  . Nonallopathic lesion-rib cage 12/03/2014  . GERD 06/11/2009  . NECK PAIN 07/21/2008  . Migraine without aura 09/20/2006    Past Surgical History:  Procedure Laterality Date  . ABDOMINAL HYSTERECTOMY    . CHOLECYSTECTOMY    . OVARIAN CYST REMOVAL    . REPAIR EXTENSOR TENDON HAND    . TONSILLECTOMY       OB History     No obstetric history on file.     Family History  Problem Relation Age of Onset  . Dementia Mother   . Osteoporosis Mother   . Breast cancer Sister   . Heart attack Brother   . Colon cancer Neg Hx   . Esophageal cancer Neg Hx   . Rectal cancer Neg Hx   . Stomach cancer Neg Hx     Social History   Tobacco Use  . Smoking status: Former Smoker    Types: Cigarettes    Quit date: 04/04/2004    Years since quitting: 15.4  . Smokeless tobacco: Never Used  Vaping Use  . Vaping Use: Never used  Substance Use Topics  . Alcohol use: Yes    Alcohol/week: 0.0 standard drinks    Comment: occ  . Drug use: No    Home Medications Prior to Admission medications   Medication Sig Start Date End Date Taking? Authorizing Provider  amLODipine (NORVASC) 5 MG tablet Take 1 tablet (5 mg total) by mouth daily. 09/10/19  Yes Isaac Bliss, Rayford Halsted, MD  buPROPion [employer] SR) 150 MG 12 hr tablet Take 1 tablet (150 mg total) by mouth 2 (two) times daily. 07/04/19  Yes Isaac Bliss, Rayford Halsted, MD  Cholecalciferol (VITAMIN D-3 PO) Take 1 capsule by mouth daily.   Yes [provider]  diphenhydramine-acetaminophen (TYLENOL PM) 25-500 MG TABS Take 2 tablets by mouth at bedtime as  needed (for sleep).    Yes [provider]  docusate sodium (COLACE) 100 MG capsule Take 200 mg by mouth at bedtime.   Yes [provider]  fish oil-omega-3 fatty acids 1000 MG capsule Take 1 g by mouth daily.   Yes [provider]  hydrocortisone (ANUSOL-HC) 2.5 % rectal cream Place 1 application rectally 2 (two) times daily. Patient taking differently: Place 1 application rectally 2 (two) times daily as needed (for anal fissure-related discomfort).  07/06/18  Yes Koberlein, Steele Berg, MD  Inositol Niacinate (NO FLUSH NIACIN) 250 MG TABS Take 250 mg by mouth in the morning.   Yes [provider]  MAGNESIUM PO Take 1 tablet by mouth at bedtime.    Yes [provider]  Misc  Natural Products (TART CHERRY ADVANCED PO) Take 1 tablet by mouth at bedtime.    Yes [provider]  pantoprazole (PROTONIX) 40 MG tablet Take 1 tablet (40 mg total) by mouth daily. Patient taking differently: Take 40 mg by mouth daily as needed (for GERD).  07/04/19  Yes Isaac Bliss, Rayford Halsted, MD  SUMAtriptan (IMITREX) 100 MG tablet TAKE 1/2-1 TABLET BY MOUTH AT ONSET MIGRAINE,MAY REPEAT ONCE 2HOURS IF NEED,MAX 2DOSES IN 24 HOURS Patient taking differently: Take 50-100 mg by mouth See admin instructions. Take 50-100 mg by mouth at onset of migraine- may repeat once in 2 hours, if no relief (max 2 doses/24 hours) 07/04/19  Yes Isaac Bliss, Rayford Halsted, MD    Allergies    Patient has no known allergies.  Review of Systems   Review of Systems  Cardiovascular: Positive for chest pain.  Gastrointestinal: Positive for abdominal pain.  All other systems reviewed and are negative.   Physical Exam Updated Vital Signs BP 114/79 (BP Location: Right Arm)   Pulse 98   Temp 98.8 F (37.1 C) (Oral)   Resp 15   Ht 5\' 5"  (1.651 m)   Wt 60.8 kg   SpO2 98%   BMI 22.30 kg/m   Physical Exam Vitals and nursing note reviewed.  HENT:     Head: Normocephalic.  Eyes:     Pupils: Pupils are equal, round, and reactive to light.  Cardiovascular:     Rate and Rhythm: Normal rate and regular rhythm.     Heart sounds: Normal heart sounds.  Pulmonary:     Effort: Pulmonary effort is normal.     Breath sounds: Normal breath sounds.  Abdominal:     General: Bowel sounds are normal.     Palpations: Abdomen is soft.  Musculoskeletal:        General: Normal range of motion.     Cervical back: Normal range of motion.  Skin:    General: Skin is warm.     Capillary Refill: Capillary refill takes less than 2 seconds.  Neurological:     General: No focal deficit present.     Mental Status: She is alert and oriented to person, place, and time.  Psychiatric:        Mood and Affect: Mood  normal.        Behavior: Behavior normal.     ED Results / Procedures / Treatments   Labs (all labs ordered are listed, but only abnormal results are displayed) Labs Reviewed  COMPREHENSIVE METABOLIC PANEL - Abnormal; Notable for the following components:      Result Value   CO2 21 (*)    Glucose, Bld 102 (*)    All other components within  normal limits  CBC - Abnormal; Notable for the following components:   Hemoglobin 15.3 (*)    All other components within normal limits  LIPASE, BLOOD  URINALYSIS, ROUTINE W REFLEX MICROSCOPIC  TROPONIN I (HIGH SENSITIVITY)  TROPONIN I (HIGH SENSITIVITY)    EKG None  Radiology DG Chest 2 View  Result Date: 09/24/2019 CLINICAL DATA:  Chest pain EXAM: CHEST - 2 VIEW COMPARISON:  None. FINDINGS: The heart size and mediastinal contours are within normal limits. Both lungs are clear. No pleural effusion or pneumothorax. The visualized skeletal structures are unremarkable. IMPRESSION: No acute process in the chest. Electronically Signed   By: Macy Mis M.D.   On: 09/24/2019 10:57    Procedures Procedures (including critical care time)  Medications Ordered in ED Medications  sodium chloride flush (NS) 0.9 % injection 3 mL (has no administration in time range)  sodium chloride 0.9 % bolus 1,000 mL (has no administration in time range)  ondansetron (ZOFRAN) injection 4 mg (has no administration in time range)  famotidine (PEPCID) IVPB 20 mg premix (has no administration in time range)  ibuprofen (ADVIL) tablet 800 mg (has no administration in time range)  ondansetron (ZOFRAN) tablet 4 mg (4 mg Oral Given 09/24/19 1016)    ED Course  I have reviewed the triage vital signs and the nursing notes.  Pertinent labs & imaging results that were available during my care of the patient were reviewed by me and considered in my medical decision making (see chart for details).    MDM Rules/Calculators/A&P                          Lindsay [name] is  a 69 y.o. female here presenting with chest pain.  Likely reflux or gastritis or gastroenteritis.  Patient has a very benign abdominal exam.  I have low suspicion for PE or ACS.  Plan to get CBC, CMP, lipase, troponin x2, chest x-ray.  Will hydrate patient and reassess.  7:04 PM Troponin negative x2, CMP unremarkable.  Patient hydrated in the ED and felt better.  Also given Pepcid.  I think likely reflux.  Will discharge home with Zofran and Pepcid as needed.   Final Clinical Impression(s) / ED Diagnoses Final diagnoses:  None    Rx / DC Orders ED Discharge Orders    None       Drenda Freeze, MD 09/24/19 [phone]

## 2019-09-24 NOTE — Discharge Instructions (Signed)
Take pepcid twice daily as needed   Continue prilosec or protonix daily   See your GI doctor for follow up. You may need an endoscopy   Return to ER if you have worse chest pain, abdominal pain, vomiting

## 2019-09-25 ENCOUNTER — Inpatient Hospital Stay: Payer: Medicare Other | Admitting: Internal Medicine

## 2019-10-03 ENCOUNTER — Encounter: Payer: Self-pay | Admitting: Internal Medicine

## 2019-10-03 MED ORDER — HYDROCORTISONE (PERIANAL) 2.5 % EX CREA: 1.0000 "application " | TOPICAL_CREAM | Freq: Two times a day (BID) | CUTANEOUS | 0 refills | Status: DC

## 2019-10-05 ENCOUNTER — Other Ambulatory Visit: Payer: Self-pay | Admitting: Internal Medicine

## 2019-10-15 ENCOUNTER — Ambulatory Visit: Payer: Medicare Other | Admitting: Family Medicine

## 2019-10-15 ENCOUNTER — Ambulatory Visit (INDEPENDENT_AMBULATORY_CARE_PROVIDER_SITE_OTHER): Payer: Medicare Other | Admitting: Internal Medicine

## 2019-10-15 ENCOUNTER — Encounter: Payer: Self-pay | Admitting: Internal Medicine

## 2019-10-15 ENCOUNTER — Other Ambulatory Visit: Payer: Self-pay

## 2019-10-15 VITALS — BP 110/80 | HR 80 | Temp 98.7°F | Wt 131.2 lb

## 2019-10-15 DIAGNOSIS — Z09 Encounter for follow-up examination after completed treatment for conditions other than malignant neoplasm: Secondary | ICD-10-CM | POA: Diagnosis not present

## 2019-10-15 DIAGNOSIS — F339 Major depressive disorder, recurrent, unspecified: Secondary | ICD-10-CM | POA: Diagnosis not present

## 2019-10-15 DIAGNOSIS — K219 Gastro-esophageal reflux disease without esophagitis: Secondary | ICD-10-CM

## 2019-10-15 DIAGNOSIS — I10 Essential (primary) hypertension: Secondary | ICD-10-CM | POA: Diagnosis not present

## 2019-10-15 DIAGNOSIS — J069 Acute upper respiratory infection, unspecified: Secondary | ICD-10-CM

## 2019-10-15 NOTE — Progress Notes (Deleted)
  [place] Phone: [phone] Subjective:    I'm seeing this patient by the request  of:  Isaac Bliss, Rayford Halsted, MD  CC:   [id number]  BATHSHEBA DURRETT is a 69 y.o. female coming in with complaint of back and neck pain. OMT 09/03/2019. Patient states   Medications patient has been prescribed:   Taking:         Reviewed prior external information including notes and imaging from previsou exam, outside providers and external EMR if available.   As well as notes that were available from care everywhere and other healthcare systems.  Past medical history, social, surgical and family history all reviewed in electronic medical record.  No pertanent information unless stated regarding to the chief complaint.   Past Medical History:  Diagnosis Date  . COMMON MIGRAINE 09/20/2006  . DEPRESSION 06/11/2009  . GERD 06/11/2009  . Hypertension 04/17/2018  . Neck pain    eval with sports medicine in 2016/17  . Osteopenia 04/17/2018    No Known Allergies   Review of Systems:  No headache, visual changes, nausea, vomiting, diarrhea, constipation, dizziness, abdominal pain, skin rash, fevers, chills, night sweats, weight loss, swollen lymph nodes, body aches, joint swelling, chest pain, shortness of breath, mood changes. POSITIVE muscle aches  Objective  There were no vitals taken for this visit.   General: No apparent distress alert and oriented x3 mood and affect normal, dressed appropriately.  HEENT: Pupils equal, extraocular movements intact  Respiratory: Patient's speak in full sentences and does not appear short of breath  Cardiovascular: No lower extremity edema, non tender, no erythema  Neuro: Cranial nerves II through XII are intact, neurovascularly intact in all extremities with 2+ DTRs and 2+ pulses.  Gait normal with good balance and coordination.  MSK:  Non tender with full range of motion and good  stability and symmetric strength and tone of shoulders, elbows, wrist, hip, knee and ankles bilaterally.  Back - Normal skin, Spine with normal alignment and no deformity.  No tenderness to vertebral process palpation.  Paraspinous muscles are not tender and without spasm.   Range of motion is full at neck and lumbar sacral regions  Osteopathic findings  C2 flexed rotated and side bent right C6 flexed rotated and side bent left T3 extended rotated and side bent right inhaled rib T9 extended rotated and side bent left L2 flexed rotated and side bent right Sacrum right on right       Assessment and Plan:    Nonallopathic problems  Decision today to treat with OMT was based on Physical Exam  After verbal consent patient was treated with HVLA, ME, FPR techniques in cervical, rib, thoracic, lumbar, and sacral  areas  Patient tolerated the procedure well with improvement in symptoms  Patient given exercises, stretches and lifestyle modifications  See medications in patient instructions if given  Patient will follow up in 4-8 weeks      The above documentation has been reviewed and is accurate and complete Jacqualin Combes       Note: This dictation was prepared with Dragon dictation along with smaller phrase technology. Any transcriptional errors that result from this process are unintentional.

## 2019-10-15 NOTE — Progress Notes (Signed)
Established Patient Office Visit     This visit occurred during the SARS-CoV-2 public health emergency.  Safety protocols were in place, including screening questions prior to the visit, additional usage of staff PPE, and extensive cleaning of exam room while observing appropriate contact time as indicated for disinfecting solutions.    CC/Reason for Visit: Blood pressure follow-up and discuss some acute concerns  HPI: Lindsay [name] is a 69 y.o. female who is coming in today for the above mentioned reasons. Past Medical History is significant for: migraine headaches, hypertension on amlodipine 2.5 mg daily, GERD on as needed PPI therapy, depression/anxiety that has been well controlled on Wellbutrin, carpal tunnel syndrome followed by sports medicine.  She just came back from a trip to the beach where her granddaughter had a cold and it seems like she has contracted it.  She has sinus pressure, headache, sore throat and a cough.  She has been fully vaccinated against Covid.  3 weeks ago she developed chest pain, nausea, vomiting, went to the emergency department and was subsequently discharged home after negative work-up.   Past Medical/Surgical History: Past Medical History:  Diagnosis Date  . COMMON MIGRAINE 09/20/2006  . DEPRESSION 06/11/2009  . GERD 06/11/2009  . Hypertension 04/17/2018  . Neck pain    eval with sports medicine in 2016/17  . Osteopenia 04/17/2018    Past Surgical History:  Procedure Laterality Date  . ABDOMINAL HYSTERECTOMY    . CHOLECYSTECTOMY    . OVARIAN CYST REMOVAL    . REPAIR EXTENSOR TENDON HAND    . TONSILLECTOMY      Social History:  reports that she quit smoking about 15 years ago. Her smoking use included cigarettes. She has never used smokeless tobacco. She reports current alcohol use. She reports that she does not use drugs.  Allergies: No Known Allergies  Family History:  Family History  Problem Relation Age of Onset  . Dementia Mother     . Osteoporosis Mother   . Breast cancer Sister   . Heart attack Brother   . Colon cancer Neg Hx   . Esophageal cancer Neg Hx   . Rectal cancer Neg Hx   . Stomach cancer Neg Hx      Current Outpatient Medications:  .  amLODipine (NORVASC) 5 MG tablet, Take 1 tablet (5 mg total) by mouth daily., Disp: 90 tablet, Rfl: 1 .  buPROPion (WELLBUTRIN SR) 150 MG 12 hr tablet, Take 1 tablet (150 mg total) by mouth 2 (two) times daily., Disp: 180 tablet, Rfl: 1 .  Cholecalciferol (VITAMIN D-3 PO), Take 1 capsule by mouth daily., Disp: , Rfl:  .  diphenhydramine-acetaminophen (TYLENOL PM) 25-500 MG TABS, Take 2 tablets by mouth at bedtime as needed (for sleep). , Disp: , Rfl:  .  docusate sodium (COLACE) 100 MG capsule, Take 200 mg by mouth at bedtime., Disp: , Rfl:  .  fish oil-omega-3 fatty acids 1000 MG capsule, Take 1 g by mouth daily., Disp: , Rfl:  .  hydrocortisone (ANUSOL-HC) 2.5 % rectal cream, Place 1 application rectally 2 (two) times daily. (Patient taking differently: Place 1 application rectally 2 (two) times daily as needed (for anal fissure-related discomfort). ), Disp: 30 g, Rfl: 0 .  hydrocortisone (ANUSOL-HC) 2.5 % rectal cream, Place 1 application rectally 2 (two) times daily., Disp: 30 g, Rfl: 0 .  Inositol Niacinate (NO FLUSH NIACIN) 250 MG TABS, Take 250 mg by mouth in the morning., Disp: , Rfl:  .  MAGNESIUM PO, Take 1 tablet by mouth at bedtime. , Disp: , Rfl:  .  Misc Natural Products (TART CHERRY ADVANCED PO), Take 1 tablet by mouth at bedtime. , Disp: , Rfl:  .  ondansetron (ZOFRAN ODT) 4 MG disintegrating tablet, 4mg  ODT q4 hours prn nausea/vomit, Disp: 10 tablet, Rfl: 0 .  pantoprazole (PROTONIX) 40 MG tablet, Take 1 tablet (40 mg total) by mouth daily. (Patient taking differently: Take 40 mg by mouth daily as needed (for GERD). ), Disp: 90 tablet, Rfl: 1 .  SUMAtriptan (IMITREX) 100 MG tablet, TAKE 1/2-1 TABLET BY MOUTH AT ONSET MIGRAINE,MAY REPEAT ONCE 2HOURS IF NEED,MAX  2DOSES IN 24 HOURS (Patient taking differently: Take 50-100 mg by mouth See admin instructions. Take 50-100 mg by mouth at onset of migraine- may repeat once in 2 hours, if no relief (max 2 doses/24 hours)), Disp: 9 tablet, Rfl: 3  Review of Systems:  Constitutional: Denies fever, chills, diaphoresis, appetite change and fatigue.  HEENT: Denies photophobia, eye pain, redness, hearing loss, ear pain,  mouth sores, trouble swallowing, neck pain, neck stiffness and tinnitus.   Respiratory: Denies SOB, DOE,  chest tightness,  and wheezing.   Cardiovascular: Denies chest pain, palpitations and leg swelling.  Gastrointestinal: Denies nausea, vomiting, abdominal pain, diarrhea, constipation, blood in stool and abdominal distention.  Genitourinary: Denies dysuria, urgency, frequency, hematuria, flank pain and difficulty urinating.  Endocrine: Denies: hot or cold intolerance, sweats, changes in hair or nails, polyuria, polydipsia. Musculoskeletal: Denies myalgias, back pain, joint swelling, arthralgias and gait problem.  Skin: Denies pallor, rash and wound.  Neurological: Denies dizziness, seizures, syncope, weakness, light-headedness, numbness and headaches.  Hematological: Denies adenopathy. Easy bruising, personal or family bleeding history  Psychiatric/Behavioral: Denies suicidal ideation, mood changes, confusion, nervousness, sleep disturbance and agitation    Physical Exam: Vitals:   10/15/19 1004  BP: 110/80  Pulse: 80  Temp: 98.7 F (37.1 C)  TempSrc: Temporal  SpO2: 97%  Weight: 131 lb 3.2 oz (59.5 kg)    Body mass index is 21.83 kg/m.   Constitutional: NAD, calm, comfortable Eyes: PERRL, lids and conjunctivae normal ENMT: Mucous membranes are moist. Posterior pharynx clear is erythematous.   Respiratory: clear to auscultation bilaterally, no wheezing, no crackles. Normal respiratory effort. No accessory muscle use.  Cardiovascular: Regular rate and rhythm, no murmurs / rubs /  gallops. No extremity edema.   Neurologic: Grossly intact and nonfocal Psychiatric: Normal judgment and insight. Alert and oriented x 3. Normal mood.    Impression and Plan:  Hospital discharge follow-up [facility] charts reviewed, no recurrence of chest pain, she has been taking daily GERD.  Essential hypertension -Well-controlled on increased dose of amlodipine from 2.5 to 5 mg.  Gastroesophageal reflux disease, unspecified whether esophagitis present -On daily PPI therapy.  Depression, recurrent ([place]), Chronic -On Wellbutrin.  Upper respiratory tract infection, unspecified type --Given HPI and exam findings today, a serious infection or illness is unlikely. We discussed potential etiologies, with a viral URI being most likely, and advised supportive care and monitoring. We discussed treatment side effects, likely course, antibiotic misuse, transmission, and signs of developing a serious illness. -Of course, we have advised to return or notify a doctor immediately if symptoms worsen or persist or new concerns arise.      Lelon Frohlich, MD [facility]

## 2019-10-17 ENCOUNTER — Encounter: Payer: Self-pay | Admitting: Internal Medicine

## 2019-10-24 ENCOUNTER — Ambulatory Visit (INDEPENDENT_AMBULATORY_CARE_PROVIDER_SITE_OTHER): Payer: Medicare Other | Admitting: Family Medicine

## 2019-10-24 ENCOUNTER — Other Ambulatory Visit: Payer: Self-pay

## 2019-10-24 ENCOUNTER — Encounter: Payer: Self-pay | Admitting: Family Medicine

## 2019-10-24 VITALS — BP 122/80 | HR 64 | Ht 65.0 in | Wt 130.0 lb

## 2019-10-24 DIAGNOSIS — M999 Biomechanical lesion, unspecified: Secondary | ICD-10-CM

## 2019-10-24 DIAGNOSIS — M5030 Other cervical disc degeneration, unspecified cervical region: Secondary | ICD-10-CM

## 2019-10-24 NOTE — Progress Notes (Signed)
[facility] [address] [place] Phone: [phone] Subjective:   I [name] am serving as a Education administrator for Dr. Hulan Saas.  This visit occurred during the SARS-CoV-2 public health emergency.  Safety protocols were in place, including screening questions prior to the visit, additional usage of staff PPE, and extensive cleaning of exam room while observing appropriate contact time as indicated for disinfecting solutions.   I'm seeing this patient by the request  of:  Erline Hau, MD  CC: Neck pain and upper back pain follow-up  [id number]  Lindsay [name] is a 69 y.o. female coming in with complaint of back and neck pain Patient states she is doing well. She is sore.   Medications patient has been prescribed: Gabapentin  Taking: No         Reviewed prior external information including notes and imaging from previsou exam, outside providers and external EMR if available.   As well as notes that were available from care everywhere and other healthcare systems.  Past medical history, social, surgical and family history all reviewed in electronic medical record.  No pertanent information unless stated regarding to the chief complaint.   Past Medical History:  Diagnosis Date   COMMON MIGRAINE 09/20/2006   DEPRESSION 06/11/2009   GERD 06/11/2009   Hypertension 04/17/2018   Neck pain    eval with sports medicine in 2016/17   Osteopenia 04/17/2018    No Known Allergies   Review of Systems:  No headache, visual changes, nausea, vomiting, diarrhea, constipation, dizziness, abdominal pain, skin rash, fevers, chills, night sweats, weight loss, swollen lymph nodes, body aches, joint swelling, chest pain, shortness of breath, mood changes. POSITIVE muscle aches  Objective  Blood pressure 122/80, pulse 64, height 5\' 5"  (1.651 m), weight 130 lb (59 kg), SpO2 98 %.   General: No apparent distress alert and oriented  x3 mood and affect normal, dressed appropriately.  HEENT: Pupils equal, extraocular movements intact  Respiratory: Patient's speak in full sentences and does not appear short of breath  Cardiovascular: No lower extremity edema, non tender, no erythema  Neuro: Cranial nerves II through XII are intact, neurovascularly intact in all extremities with 2+ DTRs and 2+ pulses.  Gait normal with good balance and coordination.  MSK:  Non tender with full range of motion and good stability and symmetric strength and tone of shoulders, elbows, wrist, hip, knee and ankles bilaterally.  Back - Normal skin, Spine with normal alignment and no deformity.  No tenderness to vertebral process palpation.  Paraspinous muscles are not tender and without spasm.   Range of motion is full at neck and lumbar sacral regions  Osteopathic findings  C2 flexed rotated and side bent left C4 flexed rotated and side bent right T3 extended rotated and side bent right inhaled rib        Assessment and Plan:  Degenerative cervical disc Known degenerative disc disease.  Discussed with patient in great length.  Patient is having a mild exacerbation but likely secondary to the cold patient did have.  Patient had had a cough and that seemed to cause more tightness of the upper back.  Discussed medication management again but patient wants to continue with when she is on at the moment and taking anti-inflammatories intermittently.  Follow-up again in 4 to 8 weeks.  Responding well to manipulation.    Nonallopathic problems  Decision today to treat with OMT was based on Physical Exam  After verbal consent patient was treated with HVLA, ME, FPR techniques in cervical, rib, thoracic,  areas  Patient tolerated the procedure well with improvement in symptoms  Patient given exercises, stretches and lifestyle modifications  See medications in patient instructions if given  Patient will follow up in 4-8 weeks      The above  documentation has been reviewed and is accurate and complete [name], DO       Note: This dictation was prepared with Dragon dictation along with smaller phrase technology. Any transcriptional errors that result from this process are unintentional.

## 2019-10-24 NOTE — Patient Instructions (Signed)
Good to see you See me again in 6-8 weeks 

## 2019-10-24 NOTE — Assessment & Plan Note (Signed)
Known degenerative disc disease.  Discussed with patient in great length.  Patient is having a mild exacerbation but likely secondary to the cold patient did have.  Patient had had a cough and that seemed to cause more tightness of the upper back.  Discussed medication management again but patient wants to continue with when she is on at the moment and taking anti-inflammatories intermittently.  Follow-up again in 4 to 8 weeks.  Responding well to manipulation.

## 2019-11-26 ENCOUNTER — Ambulatory Visit (INDEPENDENT_AMBULATORY_CARE_PROVIDER_SITE_OTHER): Payer: Medicare Other | Admitting: Family Medicine

## 2019-11-26 ENCOUNTER — Encounter: Payer: Self-pay | Admitting: Family Medicine

## 2019-11-26 ENCOUNTER — Other Ambulatory Visit: Payer: Self-pay

## 2019-11-26 VITALS — BP 130/82 | HR 60 | Ht 65.0 in | Wt 132.0 lb

## 2019-11-26 DIAGNOSIS — M999 Biomechanical lesion, unspecified: Secondary | ICD-10-CM

## 2019-11-26 DIAGNOSIS — M5030 Other cervical disc degeneration, unspecified cervical region: Secondary | ICD-10-CM

## 2019-11-26 NOTE — Assessment & Plan Note (Signed)
Continue tightness with mild radicular symptoms from time to time.  Patient is responding well with significant medications.  Continuing up to do more of the home exercise and icing regimen.  Discussed which activities to do which wants to avoid.  Follow-up again in 4 to 8 weeks

## 2019-11-26 NOTE — Progress Notes (Signed)
Lindsay [name] [address] [place] Phone: [phone] Subjective:   I [name] am serving as a Education administrator for Dr. Hulan Saas.  This visit occurred during the SARS-CoV-2 public health emergency.  Safety protocols were in place, including screening questions prior to the visit, additional usage of staff PPE, and extensive cleaning of exam room while observing appropriate contact time as indicated for disinfecting solutions.   I'm seeing this patient by the request  of:  Isaac Bliss, Rayford Halsted, MD  CC: Neck pain follow-up  [id number]  BRISTOL OSENTOSKI is a 69 y.o. female coming in with complaint of back and neck pain. OMT 10/24/2019. Patient states her neck is tight ant "noisey". Back is doing well.  Patient has been doing relatively well.  Intermittently letter symptoms  Medications patient has been prescribed: Nothing that I have prescribed  Taking:         Reviewed prior external information including notes and imaging from previsou exam, outside providers and external EMR if available.   As well as notes that were available from care everywhere and other healthcare systems.  Past medical history, social, surgical and family history all reviewed in electronic medical record.  No pertanent information unless stated regarding to the chief complaint.   Past Medical History:  Diagnosis Date  . COMMON MIGRAINE 09/20/2006  . DEPRESSION 06/11/2009  . GERD 06/11/2009  . Hypertension 04/17/2018  . Neck pain    eval with sports medicine in 2016/17  . Osteopenia 04/17/2018    No Known Allergies   Review of Systems:  No headache, visual changes, nausea, vomiting, diarrhea, constipation, dizziness, abdominal pain, skin rash, fevers, chills, night sweats, weight loss, swollen lymph nodes, body aches, joint swelling, chest pain, shortness of breath, mood changes. POSITIVE muscle aches  Objective  Blood pressure 130/82, pulse 60,  height 5\' 5"  (1.651 m), weight 132 lb (59.9 kg), SpO2 97 %.   General: No apparent distress alert and oriented x3 mood and affect normal, dressed appropriately.  HEENT: Pupils equal, extraocular movements intact  Respiratory: Patient's speak in full sentences and does not appear short of breath  Cardiovascular: No lower extremity edema, non tender, no erythema  Neuro: Cranial nerves II through XII are intact, neurovascularly intact in all extremities with 2+ DTRs and 2+ pulses.  Gait normal with good balance and coordination.  MSK:  Non tender with full range of motion and good stability and symmetric strength and tone of shoulders, elbows, wrist, hip, knee and ankles bilaterally.  Mild arthritic changes of multiple joints Neck exam does have some mild loss of lordosis, some mild crepitus noted.  Some tenderness to palpation in the parascapular region.  Patient does have mild positive Spurling's with radicular symptoms on the right side.  Osteopathic findings  C2 flexed rotated and side bent right C6 flexed rotated and side bent left T3 extended rotated and side bent right inhaled rib T8 extended rotated and side bent left L2 flexed rotated and side bent right Sacrum right on right       Assessment and Plan:  Degenerative cervical disc Continue tightness with mild radicular symptoms from time to time.  Patient is responding well with significant medications.  Continuing up to do more of the home exercise and icing regimen.  Discussed which activities to do which wants to avoid.  Follow-up again in 4 to 8 weeks  Nonallopathic lesion of cervical region   Decision today to treat with OMT  was based on Physical Exam  After verbal consent patient was treated with HVLA, ME, FPR techniques in cervical, thoracic, rib areas, all areas are chronic   Patient tolerated the procedure well with improvement in symptoms  Patient given exercises, stretches and lifestyle modifications  See  medications in patient instructions if given  Patient will follow up in 4-8 weeks         The above documentation has been reviewed and is accurate and complete [name], DO       Note: This dictation was prepared with Dragon dictation along with smaller phrase technology. Any transcriptional errors that result from this process are unintentional.

## 2019-11-26 NOTE — Assessment & Plan Note (Signed)
   Decision today to treat with OMT was based on Physical Exam  After verbal consent patient was treated with HVLA, ME, FPR techniques in cervical, thoracic, rib areas, all areas are chronic   Patient tolerated the procedure well with improvement in symptoms  Patient given exercises, stretches and lifestyle modifications  See medications in patient instructions if given  Patient will follow up in 4-8 weeks 

## 2019-11-29 ENCOUNTER — Telehealth: Payer: Self-pay | Admitting: Internal Medicine

## 2019-11-29 NOTE — Telephone Encounter (Addendum)
Patient is requesting to transfer care from Dr Isaac Bliss at [facility] to Dr Billey Gosling at [facility]. This has been approved by Dr Quay Burow.  Dr Isaac Bliss, is this okay with you?

## 2019-12-04 NOTE — Telephone Encounter (Signed)
Ok with me 

## 2019-12-05 NOTE — Telephone Encounter (Signed)
Appointment scheduled.

## 2019-12-10 ENCOUNTER — Encounter: Payer: Medicare Other | Admitting: Internal Medicine

## 2019-12-15 NOTE — Progress Notes (Signed)
Subjective:    Patient ID: Lindsay [name], female    DOB: Apr 10, 1950, 69 y.o.   MRN: [id number]  HPI  She is here to establish with a new pcp.  The patient is here for follow up of their chronic medical problems, including htn, GERD, depression, anxiety, migraines.  Went into menopause  Years ago after having a hysterectomy and then one ovary was removed and then the second.  She occasionally gets flushes - somewhat like hot flashes but not has bad.  She was unsure what these were from.     She had her dexa today.      Medications and allergies reviewed with patient and updated if appropriate.  Patient Active Problem List   Diagnosis Date Noted  . Nondisplaced fracture of lateral malleolus of right fibula, initial encounter for closed fracture 06/11/2019  . Depression with anxiety 09/20/2018  . Right carpal tunnel syndrome 08/03/2018  . Retinal hemorrhage 05/02/2018  . Hypertension 04/17/2018  . Osteopenia 04/17/2018  . Abnormal mammogram 07/14/2015  . Degeneration of lateral meniscus of right knee 05/05/2015  . Degenerative cervical disc 12/03/2014  . Nonallopathic lesion of cervical region 12/03/2014  . Nonallopathic lesion of thoracic region 12/03/2014  . Nonallopathic lesion-rib cage 12/03/2014  . Depression, recurrent ([place]) 06/11/2009  . GERD 06/11/2009  . NECK PAIN 07/21/2008  . Migraine without aura 09/20/2006    Current Outpatient Medications on File Prior to Visit  Medication Sig Dispense Refill  . amLODipine (NORVASC) 5 MG tablet Take 1 tablet (5 mg total) by mouth daily. 90 tablet 1  . buPROPion (WELLBUTRIN SR) 150 MG 12 hr tablet Take 1 tablet (150 mg total) by mouth 2 (two) times daily. 180 tablet 1  . Cholecalciferol (VITAMIN D-3 PO) Take 1 capsule by mouth daily.    . diphenhydramine-acetaminophen (TYLENOL PM) 25-500 MG TABS Take 2 tablets by mouth at bedtime as needed (for sleep).     . docusate sodium (COLACE) 100 MG capsule Take 200 mg by mouth at bedtime.     . fish oil-omega-3 fatty acids 1000 MG capsule Take 1 g by mouth daily.    . hydrocortisone (ANUSOL-HC) 2.5 % rectal cream Place 1 application rectally 2 (two) times daily. 30 g 0  . Inositol Niacinate (NO FLUSH NIACIN) 250 MG TABS Take 250 mg by mouth in the morning.    Marland Kitchen MAGNESIUM PO Take 1 tablet by mouth at bedtime.     . Misc Natural Products (TART CHERRY ADVANCED PO) Take 1 tablet by mouth at bedtime.     . pantoprazole (PROTONIX) 40 MG tablet Take 1 tablet (40 mg total) by mouth daily. (Patient taking differently: Take 40 mg by mouth daily as needed (for GERD). ) 90 tablet 1  . SUMAtriptan (IMITREX) 100 MG tablet TAKE 1/2-1 TABLET BY MOUTH AT ONSET MIGRAINE,MAY REPEAT ONCE 2HOURS IF NEED,MAX 2DOSES IN 24 HOURS (Patient taking differently: Take 50-100 mg by mouth See admin instructions. Take 50-100 mg by mouth at onset of migraine- may repeat once in 2 hours, if no relief (max 2 doses/24 hours)) 9 tablet 3   No current facility-administered medications on file prior to visit.    Past Medical History:  Diagnosis Date  . COMMON MIGRAINE 09/20/2006  . DEPRESSION 06/11/2009  . GERD 06/11/2009  . Hypertension 04/17/2018  . Neck pain    eval with sports medicine in 2016/17  . Osteopenia 04/17/2018    Past Surgical History:  Procedure Laterality Date  . ABDOMINAL  HYSTERECTOMY    . CHOLECYSTECTOMY    . OVARIAN CYST REMOVAL    . REPAIR EXTENSOR TENDON HAND    . TONSILLECTOMY      Social History   Socioeconomic History  . Marital status: Married    Spouse name: Not on file  . Number of children: Not on file  . Years of education: Not on file  . Highest education level: Not on file  Occupational History  . Not on file  Tobacco Use  . Smoking status: Former Smoker    Types: Cigarettes    Quit date: 04/04/2004    Years since quitting: 15.7  . Smokeless tobacco: Never Used  Vaping Use  . Vaping Use: Never used  Substance and Sexual Activity  . Alcohol use: Yes    Alcohol/week:  0.0 standard drinks    Comment: occ  . Drug use: No  . Sexual activity: Not on file  Other Topics Concern  . Not on file  Social History Narrative  . Not on file   Social Determinants of Health   Financial Resource Strain:   . Difficulty of Paying Living Expenses: Not on file  Food Insecurity:   . Worried About Charity fundraiser in the Last Year: Not on file  . Ran Out of Food in the Last Year: Not on file  Transportation Needs:   . Lack of Transportation (Medical): Not on file  . Lack of Transportation (Non-Medical): Not on file  Physical Activity:   . Days of Exercise per Week: Not on file  . Minutes of Exercise per Session: Not on file  Stress:   . Feeling of Stress : Not on file  Social Connections:   . Frequency of Communication with Friends and Family: Not on file  . Frequency of Social Gatherings with Friends and Family: Not on file  . Attends Religious Services: Not on file  . Active Member of Clubs or Organizations: Not on file  . Attends Archivist Meetings: Not on file  . Marital Status: Not on file    Family History  Problem Relation Age of Onset  . Dementia Mother   . Osteoporosis Mother   . Breast cancer Sister   . Heart attack Brother   . Colon cancer Neg Hx   . Esophageal cancer Neg Hx   . Rectal cancer Neg Hx   . Stomach cancer Neg Hx     Review of Systems  Constitutional: Negative for chills and fever.       Occ flushes  HENT: Negative for postnasal drip.   Respiratory: Positive for cough (since ED episode). Negative for shortness of breath and wheezing.   Cardiovascular: Negative for chest pain, palpitations and leg swelling.  Gastrointestinal:       GERD controlled  Neurological: Positive for dizziness and headaches (rare migraines). Negative for light-headedness.       Objective:   Vitals:   12/17/19 1324  BP: 130/78  Pulse: 79  Temp: 98.2 F (36.8 C)  SpO2: 97%   BP Readings from Last 3 Encounters:  12/17/19 130/78    11/26/19 130/82  10/24/19 122/80   Wt Readings from Last 3 Encounters:  12/17/19 133 lb 6.4 oz (60.5 kg)  11/26/19 132 lb (59.9 kg)  10/24/19 130 lb (59 kg)   Body mass index is 22.2 kg/m.   Physical Exam    Constitutional: Appears well-developed and well-nourished. No distress.  HENT:  Head: Normocephalic and atraumatic.  Neck: Neck supple.  No tracheal deviation present. No thyromegaly present.  No cervical lymphadenopathy Cardiovascular: Normal rate, regular rhythm and normal heart sounds.   No murmur heard. No carotid bruit .  No edema Pulmonary/Chest: Effort normal and breath sounds normal. No respiratory distress. No has no wheezes. No rales.  Abdomen: soft, NT, ND Skin: Skin is warm and dry. Not diaphoretic.  Psychiatric: Normal mood and affect. Behavior is normal.      Assessment & Plan:    See Problem List for Assessment and Plan of chronic medical problems.    This visit occurred during the SARS-CoV-2 public health emergency.  Safety protocols were in place, including screening questions prior to the visit, additional usage of staff PPE, and extensive cleaning of exam room while observing appropriate contact time as indicated for disinfecting solutions.     FU for annual FU next spring with labs prior

## 2019-12-16 ENCOUNTER — Other Ambulatory Visit: Payer: Medicare Other

## 2019-12-17 ENCOUNTER — Other Ambulatory Visit: Payer: Self-pay

## 2019-12-17 ENCOUNTER — Ambulatory Visit
Admission: RE | Admit: 2019-12-17 | Discharge: 2019-12-17 | Disposition: A | Discharge status: Home / Self Care | Payer: Medicare Other | Source: Ambulatory Visit | Attending: Internal Medicine | Admitting: Internal Medicine

## 2019-12-17 ENCOUNTER — Encounter: Payer: Self-pay | Admitting: Internal Medicine

## 2019-12-17 ENCOUNTER — Encounter: Payer: Self-pay | Admitting: Family Medicine

## 2019-12-17 ENCOUNTER — Ambulatory Visit (INDEPENDENT_AMBULATORY_CARE_PROVIDER_SITE_OTHER): Payer: Medicare Other | Admitting: Internal Medicine

## 2019-12-17 VITALS — BP 130/78 | HR 79 | Temp 98.2°F | Wt 133.4 lb

## 2019-12-17 DIAGNOSIS — M8589 Other specified disorders of bone density and structure, multiple sites: Secondary | ICD-10-CM | POA: Diagnosis not present

## 2019-12-17 DIAGNOSIS — K602 Anal fissure, unspecified: Secondary | ICD-10-CM

## 2019-12-17 DIAGNOSIS — I10 Essential (primary) hypertension: Secondary | ICD-10-CM | POA: Diagnosis not present

## 2019-12-17 DIAGNOSIS — M85851 Other specified disorders of bone density and structure, right thigh: Secondary | ICD-10-CM | POA: Diagnosis not present

## 2019-12-17 DIAGNOSIS — Z780 Asymptomatic menopausal state: Secondary | ICD-10-CM | POA: Diagnosis not present

## 2019-12-17 DIAGNOSIS — K219 Gastro-esophageal reflux disease without esophagitis: Secondary | ICD-10-CM | POA: Diagnosis not present

## 2019-12-17 DIAGNOSIS — G43009 Migraine without aura, not intractable, without status migrainosus: Secondary | ICD-10-CM | POA: Diagnosis not present

## 2019-12-17 DIAGNOSIS — F418 Other specified anxiety disorders: Secondary | ICD-10-CM

## 2019-12-17 DIAGNOSIS — Z13820 Encounter for screening for osteoporosis: Secondary | ICD-10-CM

## 2019-12-17 DIAGNOSIS — M85852 Other specified disorders of bone density and structure, left thigh: Secondary | ICD-10-CM | POA: Diagnosis not present

## 2019-12-17 MED ORDER — AMLODIPINE BESYLATE 10 MG PO TABS
10.0000 mg | ORAL_TABLET | Freq: Every day | ORAL | 3 refills | Status: DC
Start: 2019-12-17 — End: 2019-12-19

## 2019-12-17 NOTE — Assessment & Plan Note (Addendum)
Chronic BP well controlled Current regimen effective and well tolerated Continue current medications at current doses  

## 2019-12-17 NOTE — Patient Instructions (Addendum)
  Medications reviewed and updated.  Changes include :   Taper off the protonix if possible.  Start calcium.      Follow up for your physical next year.

## 2019-12-17 NOTE — Assessment & Plan Note (Signed)
Chronic Controlled, stable Continue current dose of medication ? Still need wellbutrin - can try to decrease at any time to see -- dec to one pill a day and see how she does for a few weeks then can stop  Can restart if needed

## 2019-12-17 NOTE — Assessment & Plan Note (Addendum)
Chronic Had dexa today Taking vitamin d daily Advised 600 mg of calcium a day

## 2019-12-17 NOTE — Assessment & Plan Note (Signed)
Chronic, intermittent No current symptoms

## 2019-12-17 NOTE — Assessment & Plan Note (Signed)
Since episode in June has been taking protonix daily - ? Severe GERD that brought her to the hospital No GERD with the medication - prior to that event she only took the Protonix prn Try to taper off the medication and go back to taking it prn only

## 2019-12-17 NOTE — Assessment & Plan Note (Signed)
Chronic Infrequent Takes imitrex prn

## 2019-12-19 ENCOUNTER — Encounter: Payer: Self-pay | Admitting: Internal Medicine

## 2019-12-19 MED ORDER — AMLODIPINE BESYLATE 5 MG PO TABS: 5.0000 mg | ORAL_TABLET | Freq: Every day | ORAL | 3 refills | Status: DC

## 2020-01-01 ENCOUNTER — Encounter: Payer: Self-pay | Admitting: Internal Medicine

## 2020-01-01 DIAGNOSIS — Z23 Encounter for immunization: Secondary | ICD-10-CM | POA: Diagnosis not present

## 2020-01-01 DIAGNOSIS — F418 Other specified anxiety disorders: Secondary | ICD-10-CM

## 2020-01-02 MED ORDER — BUPROPION HCL ER (SR) 150 MG PO TB12: 150.0000 mg | ORAL_TABLET | Freq: Two times a day (BID) | ORAL | 1 refills | Status: DC

## 2020-01-08 ENCOUNTER — Ambulatory Visit (INDEPENDENT_AMBULATORY_CARE_PROVIDER_SITE_OTHER): Payer: Medicare Other | Admitting: Family Medicine

## 2020-01-08 ENCOUNTER — Encounter: Payer: Self-pay | Admitting: Family Medicine

## 2020-01-08 ENCOUNTER — Other Ambulatory Visit: Payer: Self-pay

## 2020-01-08 VITALS — BP 120/82 | HR 68 | Ht 65.0 in | Wt 122.0 lb

## 2020-01-08 DIAGNOSIS — M5030 Other cervical disc degeneration, unspecified cervical region: Secondary | ICD-10-CM

## 2020-01-08 DIAGNOSIS — M5010 Cervical disc disorder with radiculopathy, unspecified cervical region: Secondary | ICD-10-CM

## 2020-01-08 DIAGNOSIS — M999 Biomechanical lesion, unspecified: Secondary | ICD-10-CM | POA: Diagnosis not present

## 2020-01-08 MED ORDER — GABAPENTIN 100 MG PO CAPS: 200.0000 mg | ORAL_CAPSULE | Freq: Every day | ORAL | 0 refills | Status: DC

## 2020-01-08 NOTE — Progress Notes (Signed)
[place] Phone: [phone] Subjective:   Lindsay Lindsay [name], am serving as a scribe for Dr. Hulan Saas. This visit occurred during the SARS-CoV-2 public health emergency.  Safety protocols were in place, including screening questions prior to the visit, additional usage of staff PPE, and extensive cleaning of exam room while observing appropriate contact time as indicated for disinfecting solutions.   I'm seeing this patient by the request  of:  Binnie Rail, MD  CC: Back and neck pain follow-up  [id number]  Lindsay Lindsay [name] is a 69 y.o. female coming in with complaint of back and neck pain. OMT 11/26/2019. Patient states that she lifted some rocks and twisted her back. Pain in thoracic and lumbar spine on left side.   Is also complaining of numbness in bilateral thumbs and 2nd and 3rd fingers. Had epidural in 2018.   Medications patient has been prescribed: None          Reviewed prior external information including notes and imaging from previsou exam, outside providers and external EMR if available.   As well as notes that were available from care everywhere and other healthcare systems.  Past medical history, social, surgical and family history all reviewed in electronic medical record.  Lindsay [name] pertanent information unless stated regarding to the chief complaint.   Past Medical History:  Diagnosis Date  . COMMON MIGRAINE 09/20/2006  . DEPRESSION 06/11/2009  . GERD 06/11/2009  . Hypertension 04/17/2018  . Neck pain    eval with sports medicine in 2016/17  . Osteopenia 04/17/2018    Lindsay [name] Known Allergies   Review of Systems:  Lindsay [name] headache, visual changes, nausea, vomiting, diarrhea, constipation, dizziness, abdominal pain, skin rash, fevers, chills, night sweats, weight loss, swollen lymph nodes, body aches, joint swelling, chest pain, shortness of breath, mood changes. POSITIVE muscle aches  Objective    Blood pressure 120/82, pulse 68, height 5\' 5"  (1.651 m), weight 122 lb (55.3 kg), SpO2 99 %.   General: Lindsay [name] apparent distress alert and oriented x3 mood and affect normal, dressed appropriately.  HEENT: Pupils equal, extraocular movements intact  Respiratory: Patient's speak in full sentences and does not appear short of breath  Cardiovascular: Lindsay [name] lower extremity edema, non tender, Lindsay [name] erythema  Neuro: Cranial nerves II through XII are intact, neurovascularly intact in all extremities with 2+ DTRs and 2+ pulses.  Gait normal with good balance and coordination.  MSK: Mild tender with full range of motion and good stability and symmetric strength and tone of shoulders, elbows, wrist, hip, knee and ankles bilaterally.  Mild arthritic changes of multiple joints.  Very minimal positive Tinel's on the right side of the wrist.  Neck exam does have loss of lordosis.  Mild positive Spurling's on the right side.  Tender to palpation diffusely.  Osteopathic findings  C6 flexed rotated and side bent left T3 extended rotated and side bent right inhaled rib T5 extended rotated and side bent left       Assessment and Plan:  Degenerative cervical disc Exacerbation of an underlying problem.  He was restarted gabapentin.  Warned of potential side effects.  Will have an epidural again.  Has been 3 years since her last 1.  Differential includes carpal tunnel but I think it is less likely.  If continuing to have trouble we will consider injections at that time.  Follow-up with me again in 6 weeks    Nonallopathic problems  Decision today  to treat with OMT was based on Physical Exam  After verbal consent patient was treated with HVLA, ME, FPR techniques in cervical, rib, thoracic,\areas  Patient tolerated the procedure well with improvement in symptoms  Patient given exercises, stretches and lifestyle modifications  See medications in patient instructions if given  Patient will follow up in 4-8  weeks      The above documentation has been reviewed and is accurate and complete Lindsay Pulley, DO       Note: This dictation was prepared with Dragon dictation along with smaller phrase technology. Any transcriptional errors that result from this process are unintentional.

## 2020-01-08 NOTE — Assessment & Plan Note (Signed)
Exacerbation of an underlying problem.  He was restarted gabapentin.  Warned of potential side effects.  Will have an epidural again.  Has been 3 years since her last 1.  Differential includes carpal tunnel but I think it is less likely.  If continuing to have trouble we will consider injections at that time.  Follow-up with me again in 6 weeks

## 2020-01-08 NOTE — Patient Instructions (Signed)
Gabapentin 200mg  at night Epidural [phone] See me in 6 weeks, if not better will consider carpal tunnel injection

## 2020-01-09 ENCOUNTER — Encounter: Payer: Self-pay | Admitting: Family Medicine

## 2020-01-23 ENCOUNTER — Ambulatory Visit
Admission: RE | Admit: 2020-01-23 | Discharge: 2020-01-23 | Disposition: A | Discharge status: Home / Self Care | Payer: Medicare Other | Source: Ambulatory Visit | Attending: Family Medicine | Admitting: Family Medicine

## 2020-01-23 ENCOUNTER — Other Ambulatory Visit: Payer: Self-pay

## 2020-01-23 DIAGNOSIS — M47812 Spondylosis without myelopathy or radiculopathy, cervical region: Secondary | ICD-10-CM | POA: Diagnosis not present

## 2020-01-23 DIAGNOSIS — M5030 Other cervical disc degeneration, unspecified cervical region: Secondary | ICD-10-CM

## 2020-01-23 MED ORDER — IOPAMIDOL (ISOVUE-M 300) INJECTION 61%
1.0000 mL | Freq: Once | INTRAMUSCULAR | Status: AC | PRN
  Administered 2020-01-23: 1 mL via EPIDURAL

## 2020-01-23 MED ORDER — TRIAMCINOLONE ACETONIDE 40 MG/ML IJ SUSP (RADIOLOGY)
40.0000 mg | Freq: Once | INTRAMUSCULAR | Status: AC
  Administered 2020-01-23: 40 mg via EPIDURAL

## 2020-01-23 NOTE — Discharge Instructions (Signed)

## 2020-02-18 NOTE — Progress Notes (Signed)
[facility] [address] [place] Phone: [phone] Subjective:   I Lindsay [name] am serving as a Education administrator for Dr. Hulan Saas.  This visit occurred during the SARS-CoV-2 public health emergency.  Safety protocols were in place, including screening questions prior to the visit, additional usage of staff PPE, and extensive cleaning of exam room while observing appropriate contact time as indicated for disinfecting solutions.   I'm seeing this patient by the request  of:  Binnie Rail, MD  CC: Neck pain follow-up  GYB:[id number]   01/08/2020 Exacerbation of an underlying problem.  He was restarted gabapentin.  Warned of potential side effects.  Will have an epidural again.  Has been 3 years since her last 1.  Differential includes carpal tunnel but I think it is less likely.  If continuing to have trouble we will consider injections at that time.  Follow-up with me again in 6 weeks  Update 02/19/2020 Lindsay [name] is a 69 y.o. female coming in with complaint of neck pain. Epidural 01/23/2020. Patient states the neck has been better since the injection. Numbness and pain has decreased.  With states she is approximately 80% better.  Making significant progress.      Past Medical History:  Diagnosis Date   COMMON MIGRAINE 09/20/2006   DEPRESSION 06/11/2009   GERD 06/11/2009   Hypertension 04/17/2018   Neck pain    eval with sports medicine in 2016/17   Osteopenia 04/17/2018   Past Surgical History:  Procedure Laterality Date   ABDOMINAL HYSTERECTOMY     CHOLECYSTECTOMY     OVARIAN CYST REMOVAL     REPAIR EXTENSOR TENDON HAND     TONSILLECTOMY     Social History   Socioeconomic History   Marital status: Married    Spouse name: Not on file   Number of children: Not on file   Years of education: Not on file   Highest education level: Not on file  Occupational History   Not on file  Tobacco Use   Smoking status:  Former Smoker    Types: Cigarettes    Quit date: 04/04/2004    Years since quitting: 15.8   Smokeless tobacco: Never Used  Vaping Use   Vaping Use: Never used  Substance and Sexual Activity   Alcohol use: Yes    Alcohol/week: 0.0 standard drinks    Comment: occ   Drug use: No   Sexual activity: Not on file  Other Topics Concern   Not on file  Social History Narrative   Not on file   Social Determinants of Health   Financial Resource Strain:    Difficulty of Paying Living Expenses: Not on file  Food Insecurity:    Worried About [place] in the Last Year: Not on file   Ran Out of Food in the Last Year: Not on file  Transportation Needs:    Lack of Transportation (Medical): Not on file   Lack of Transportation (Non-Medical): Not on file  Physical Activity:    Days of Exercise per Week: Not on file   Minutes of Exercise per Session: Not on file  Stress:    Feeling of Stress : Not on file  Social Connections:    Frequency of Communication with Friends and Family: Not on file   Frequency of Social Gatherings with Friends and Family: Not on file   Attends Religious Services: Not on file   Active Member of Clubs or  Organizations: Not on file   Attends Archivist Meetings: Not on file   Marital Status: Not on file   No Known Allergies Family History  Problem Relation Age of Onset   Dementia Mother    Osteoporosis Mother    Breast cancer Sister    Heart attack Brother    Colon cancer Neg Hx    Esophageal cancer Neg Hx    Rectal cancer Neg Hx    Stomach cancer Neg Hx      Current Outpatient Medications (Cardiovascular):    amLODipine (NORVASC) 5 MG tablet, Take 1 tablet (5 mg total) by mouth daily.   Inositol Niacinate (NO FLUSH NIACIN) 250 MG TABS, Take 250 mg by mouth in the morning.   Current Outpatient Medications (Analgesics):    SUMAtriptan (IMITREX) 100 MG tablet, TAKE 1/2-1 TABLET BY MOUTH AT ONSET  MIGRAINE,MAY REPEAT ONCE 2HOURS IF NEED,MAX 2DOSES IN 24 HOURS (Patient taking differently: Take 50-100 mg by mouth See admin instructions. Take 50-100 mg by mouth at onset of migraine- may repeat once in 2 hours, if no relief (max 2 doses/24 hours))   Current Outpatient Medications (Other):    buPROPion (WELLBUTRIN SR) 150 MG 12 hr tablet, Take 1 tablet (150 mg total) by mouth 2 (two) times daily.   Cholecalciferol (VITAMIN D-3 PO), Take 1 capsule by mouth daily.   diphenhydramine-acetaminophen (TYLENOL PM) 25-500 MG TABS, Take 2 tablets by mouth at bedtime as needed (for sleep).    docusate sodium (COLACE) 100 MG capsule, Take 200 mg by mouth at bedtime.   fish oil-omega-3 fatty acids 1000 MG capsule, Take 1 g by mouth daily.   gabapentin (NEURONTIN) 100 MG capsule, Take 2 capsules (200 mg total) by mouth at bedtime.   hydrocortisone (ANUSOL-HC) 2.5 % rectal cream, Place 1 application rectally 2 (two) times daily.   MAGNESIUM PO, Take 1 tablet by mouth at bedtime.    Misc Natural Products (TART CHERRY ADVANCED PO), Take 1 tablet by mouth at bedtime.    pantoprazole (PROTONIX) 40 MG tablet, Take 1 tablet (40 mg total) by mouth daily. (Patient taking differently: Take 40 mg by mouth daily as needed (for GERD). )   Reviewed prior external information including notes and imaging from  primary care provider As well as notes that were available from care everywhere and other healthcare systems.  Past medical history, social, surgical and family history all reviewed in electronic medical record.  No pertanent information unless stated regarding to the chief complaint.   Review of Systems:  No headache, visual changes, nausea, vomiting, diarrhea, constipation, dizziness, abdominal pain, skin rash, fevers, chills, night sweats, weight loss, swollen lymph nodes, body aches, joint swelling, chest pain, shortness of breath, mood changes. POSITIVE muscle aches  Objective  Blood pressure  120/78, pulse 67, height 5\' 5"  (1.651 m), weight 132 lb (59.9 kg), SpO2 97 %.   General: No apparent distress alert and oriented x3 mood and affect normal, dressed appropriately.  HEENT: Pupils equal, extraocular movements intact  Respiratory: Patient's speak in full sentences and does not appear short of breath  Cardiovascular: No lower extremity edema, non tender, no erythema  Neuro: Cranial nerves II through XII are intact, neurovascularly intact in all extremities with 2+ DTRs and 2+ pulses.  Gait normal with good balance and coordination.  Neck exam still has some mild limited range of motion.  Patient does have some mild crepitus.  Patient does have 5-5 strength.  Still mild Tinel's positive on the  right wrist.  Good grip strength noted.  Arthritic changes of the [facility] joint noted \ Osteopathic findings  C2 flexed rotated and side bent right C4 flexed rotated and side bent left C6 flexed rotated and side bent left T3 extended rotated and side bent right inhaled third rib     Impression and Recommendations:     The above documentation has been reviewed and is accurate and complete [name], DO

## 2020-02-19 ENCOUNTER — Ambulatory Visit (INDEPENDENT_AMBULATORY_CARE_PROVIDER_SITE_OTHER): Payer: Medicare Other | Admitting: Family Medicine

## 2020-02-19 ENCOUNTER — Other Ambulatory Visit: Payer: Self-pay

## 2020-02-19 ENCOUNTER — Encounter: Payer: Self-pay | Admitting: Family Medicine

## 2020-02-19 VITALS — BP 120/78 | HR 67 | Ht 65.0 in | Wt 132.0 lb

## 2020-02-19 DIAGNOSIS — M5030 Other cervical disc degeneration, unspecified cervical region: Secondary | ICD-10-CM | POA: Diagnosis not present

## 2020-02-19 DIAGNOSIS — M999 Biomechanical lesion, unspecified: Secondary | ICD-10-CM

## 2020-02-19 NOTE — Assessment & Plan Note (Signed)
   Decision today to treat with OMT was based on Physical Exam  After verbal consent patient was treated with HVLA, ME, FPR techniques in cervical, thoracic, rib,areas, all areas are chronic   Patient tolerated the procedure well with improvement in symptoms  Patient given exercises, stretches and lifestyle modifications  See medications in patient instructions if given  Patient will follow up in 4-8 weeks 

## 2020-02-19 NOTE — Assessment & Plan Note (Signed)
Known arthritic changes but patient did respond well to another epidural in the neck.  Discussed with patient about icing regimen and home exercises, discussed which activities to doing which wants to avoid.  Patient is to increase activity slowly over the course the next several weeks.  Patient did respond well to manipulation again.  No change in medication but did discuss the gabapentin.

## 2020-02-24 ENCOUNTER — Telehealth: Payer: Self-pay | Admitting: Internal Medicine

## 2020-02-24 NOTE — Telephone Encounter (Signed)
   Patient calling to report vaginal discharge and itching Patient does not have a  GYN   Please advise

## 2020-02-25 ENCOUNTER — Other Ambulatory Visit (HOSPITAL_COMMUNITY)
Admission: RE | Admit: 2020-02-25 | Discharge: 2020-02-25 | Disposition: A | Discharge status: Home / Self Care | Payer: Medicare Other | Source: Ambulatory Visit | Attending: Family | Admitting: Family

## 2020-02-25 ENCOUNTER — Other Ambulatory Visit: Payer: Self-pay

## 2020-02-25 ENCOUNTER — Encounter: Payer: Self-pay | Admitting: Family

## 2020-02-25 ENCOUNTER — Ambulatory Visit (INDEPENDENT_AMBULATORY_CARE_PROVIDER_SITE_OTHER): Payer: Medicare Other | Admitting: Family

## 2020-02-25 VITALS — BP 130/86 | HR 73 | Temp 98.0°F | Ht 65.0 in | Wt 131.4 lb

## 2020-02-25 DIAGNOSIS — Z1151 Encounter for screening for human papillomavirus (HPV): Secondary | ICD-10-CM | POA: Diagnosis not present

## 2020-02-25 DIAGNOSIS — N8110 Cystocele, unspecified: Secondary | ICD-10-CM | POA: Diagnosis not present

## 2020-02-25 DIAGNOSIS — N898 Other specified noninflammatory disorders of vagina: Secondary | ICD-10-CM | POA: Insufficient documentation

## 2020-02-25 DIAGNOSIS — Z01419 Encounter for gynecological examination (general) (routine) without abnormal findings: Secondary | ICD-10-CM | POA: Diagnosis not present

## 2020-02-25 NOTE — Progress Notes (Signed)
Lindsay [name] is a 69 y.o. female with the following history as recorded in Epic[facility]:  Patient Active Problem List   Diagnosis Date Noted  . Anal fissure 12/17/2019  . Nondisplaced fracture of lateral malleolus of right fibula, initial encounter for closed fracture 06/11/2019  . Depression with anxiety 09/20/2018  . Right carpal tunnel syndrome 08/03/2018  . Retinal hemorrhage 05/02/2018  . Hypertension 04/17/2018  . Osteopenia 04/17/2018  . Degeneration of lateral meniscus of right knee 05/05/2015  . Degenerative cervical disc 12/03/2014  . Nonallopathic lesion of cervical region 12/03/2014  . Nonallopathic lesion of thoracic region 12/03/2014  . Nonallopathic lesion-rib cage 12/03/2014  . GERD 06/11/2009  . NECK PAIN 07/21/2008  . Migraine without aura 09/20/2006    Current Outpatient Medications  Medication Sig Dispense Refill  . amLODipine (NORVASC) 5 MG tablet Take 1 tablet (5 mg total) by mouth daily. 90 tablet 3  . buPROPion (WELLBUTRIN SR) 150 MG 12 hr tablet Take 1 tablet (150 mg total) by mouth 2 (two) times daily. 180 tablet 1  . Cholecalciferol (VITAMIN D-3 PO) Take 1 capsule by mouth daily.    . diphenhydramine-acetaminophen (TYLENOL PM) 25-500 MG TABS Take 2 tablets by mouth at bedtime as needed (for sleep).     . docusate sodium (COLACE) 100 MG capsule Take 200 mg by mouth at bedtime.    . fish oil-omega-3 fatty acids 1000 MG capsule Take 1 g by mouth daily.    Marland Kitchen gabapentin (NEURONTIN) 100 MG capsule Take 2 capsules (200 mg total) by mouth at bedtime. 180 capsule 0  . hydrocortisone (ANUSOL-HC) 2.5 % rectal cream Place 1 application rectally 2 (two) times daily. 30 g 0  . Inositol Niacinate (NO FLUSH NIACIN) 250 MG TABS Take 250 mg by mouth in the morning.    Marland Kitchen MAGNESIUM PO Take 1 tablet by mouth at bedtime.     . Misc Natural Products (TART CHERRY ADVANCED PO) Take 1 tablet by mouth at bedtime.     . pantoprazole (PROTONIX) 40 MG tablet Take 1 tablet (40 mg total) by  mouth daily. (Patient taking differently: Take 40 mg by mouth daily as needed (for GERD). ) 90 tablet 1  . SUMAtriptan (IMITREX) 100 MG tablet TAKE 1/2-1 TABLET BY MOUTH AT ONSET MIGRAINE,MAY REPEAT ONCE 2HOURS IF NEED,MAX 2DOSES IN 24 HOURS (Patient taking differently: Take 50-100 mg by mouth See admin instructions. Take 50-100 mg by mouth at onset of migraine- may repeat once in 2 hours, if no relief (max 2 doses/24 hours)) 9 tablet 3   No current facility-administered medications for this visit.    Allergies: Patient has no known allergies.  Past Medical History:  Diagnosis Date  . COMMON MIGRAINE 09/20/2006  . DEPRESSION 06/11/2009  . GERD 06/11/2009  . Hypertension 04/17/2018  . Neck pain    eval with sports medicine in 2016/17  . Osteopenia 04/17/2018    Past Surgical History:  Procedure Laterality Date  . ABDOMINAL HYSTERECTOMY    . CHOLECYSTECTOMY    . OVARIAN CYST REMOVAL    . REPAIR EXTENSOR TENDON HAND    . TONSILLECTOMY      Family History  Problem Relation Age of Onset  . Dementia Mother   . Osteoporosis Mother   . Breast cancer Sister   . Heart attack Brother   . Colon cancer Neg Hx   . Esophageal cancer Neg Hx   . Rectal cancer Neg Hx   . Stomach cancer Neg Hx  Social History   Tobacco Use  . Smoking status: Former Smoker    Types: Cigarettes    Quit date: 04/04/2004    Years since quitting: 15.9  . Smokeless tobacco: Never Used  Substance Use Topics  . Alcohol use: Yes    Alcohol/week: 0.0 standard drinks    Comment: occ    Subjective:  Patient presents with concerns for recurrent episodes of vaginal itching; notes has occurred periodically for a couple of years; no vaginal spotting; s/p total hysterectomy for uterine fibroids; has been noticing a "protrusion" recently but no pain or urinary changes;     Objective:  Vitals:   02/25/20 1145  BP: 130/86  Pulse: 73  Temp: 98 F (36.7 C)  TempSrc: Oral  SpO2: 99%  Weight: 131 lb 6.4 oz (59.6 kg)   Height: 5\' 5"  (1.651 m)    General: Well developed, well nourished, in no acute distress  Skin : Warm and dry.  Head: Normocephalic and atraumatic  Lungs: Respirations unlabored;  Neurologic: Alert and oriented; speech intact; face symmetrical; moves all extremities well; CNII-XII intact without focal deficit  Pelvic exam- vaginal vault is normal; cystocele is noted; no obvious vaginal discharge noted on exam;  Assessment:  1. Female cystocele   2. Vaginal itching   3. Vaginal discharge     Plan:  Update thin prep pap to evaluate for possible BV or yeast but nothing obvious noted on exams; suspect area of lichen sclerosis at lower left outer vaginal region- okay to use topical steroid if/ when area bothers her; discussed cystocele and will refer to urogyn for further evaluation/ second opinion; may need to consider topical estrogen;  This visit occurred during the SARS-CoV-2 public health emergency.  Safety protocols were in place, including screening questions prior to the visit, additional usage of staff PPE, and extensive cleaning of exam room while observing appropriate contact time as indicated for disinfecting solutions.      No follow-ups on file.  Orders Placed This Encounter  Procedures  . Ambulatory referral to Urogynecology    Referral Priority:   Routine    Referral Type:   Consultation    Referral Reason:   Specialty Services Required    Requested Specialty:   Urology    Number of Visits Requested:   1    Requested Prescriptions    No prescriptions requested or ordered in this encounter

## 2020-02-25 NOTE — Telephone Encounter (Signed)
   Scheduler left message to have patient call back to get an appointment with Valere Dross for today

## 2020-02-29 DIAGNOSIS — Z23 Encounter for immunization: Secondary | ICD-10-CM | POA: Diagnosis not present

## 2020-03-01 ENCOUNTER — Other Ambulatory Visit: Payer: Self-pay | Admitting: Internal Medicine

## 2020-03-01 ENCOUNTER — Other Ambulatory Visit: Payer: Self-pay | Admitting: Family Medicine

## 2020-03-01 DIAGNOSIS — I10 Essential (primary) hypertension: Secondary | ICD-10-CM

## 2020-03-03 LAB — CYTOLOGY - PAP
Comment: NEGATIVE
Diagnosis: NEGATIVE
High risk HPV: NEGATIVE

## 2020-03-06 ENCOUNTER — Encounter: Payer: Self-pay | Admitting: Internal Medicine

## 2020-03-06 NOTE — Progress Notes (Signed)
[facility] New Patient Evaluation and Consultation  Referring Provider: Marrian Salvage,* PCP: Binnie Rail, MD Date of Service: 03/11/2020  SUBJECTIVE Chief Complaint: New Patient (Initial Visit) Lindsay [name] ref)  History of Present Illness: Lindsay [name] is a 69 y.o. White or Caucasian female seen in consultation at the request of Dr. Valere Dross for evaluation of prolapse.    Review of records significant for: Has noticed a protrusion in the vaginal area. S/p Hysterectomy for fibroids.   Urinary Symptoms: Leaks urine with with urgency- infrequent Pad use: none She is not bothered by her UI symptoms.  Day time voids 8.  Nocturia: rare  Voiding dysfunction: she empties her bladder well.  does not use a catheter to empty bladder.  When urinating, she occionally feels difficulty starting urine stream Feels burning on the outside when she urinates- more vaginal irritation.   UTIs: 0 UTI's in the last year.   Denies history of blood in urine and kidney or bladder stones  Pelvic Organ Prolapse Symptoms:                  She Admits to a feeling of a bulge the vaginal area. It has been present for 6 months.  She Denies seeing a bulge.  This bulge is sometimes bothersome.  Bowel Symptom: Bowel movements: 1 time(s) per day Stool consistency: hard, soft  or loose Straining: no.  Splinting: no.  Incomplete evacuation: no.  She Denies accidental bowel leakage / fecal incontinence Bowel regimen: stool softener Last colonoscopy: Date 2016, Results negative Uses hydrocortisone cream with anal fissures  Sexual Function Sexually active: no.  Sexual orientation: heterosexual  Pelvic Pain Denies pelvic pain  Past Medical History:  Past Medical History:  Diagnosis Date  . COMMON MIGRAINE 09/20/2006  . DEPRESSION 06/11/2009  . GERD 06/11/2009  . Hypertension 04/17/2018  . Neck pain    eval with sports medicine in 2016/17  . Osteopenia 04/17/2018     Past  Surgical History:   Past Surgical History:  Procedure Laterality Date  . ABDOMINAL HYSTERECTOMY    . BILATERAL OOPHORECTOMY    . CHOLECYSTECTOMY    . OVARIAN CYST REMOVAL    . REPAIR EXTENSOR TENDON HAND    . TONSILLECTOMY       Past OB/GYN History: G2 P2 Vaginal deliveries: 2,  Forceps/ Vacuum deliveries: 0, Cesarean section: 0 Menopausal: Yes, at age 65s, Denies vaginal bleeding since menopause Contraception: n/a. Last pap smear was 02/2020- negative.  Any history of abnormal pap smears: no.   Medications: She has a current medication list which includes the following prescription(s): amlodipine, bupropion, cholecalciferol, diphenhydramine-acetaminophen, docusate sodium, fish oil-omega-3 fatty acids, gabapentin, hydrocortisone, no flush niacin, magnesium, misc natural products, [START ON 03/12/2020] estradiol, pantoprazole, and sumatriptan.   Allergies: Patient has No Known Allergies.   Social History:  Social History   Tobacco Use  . Smoking status: Former Smoker    Types: Cigarettes    Quit date: 04/04/2004    Years since quitting: 15.9  . Smokeless tobacco: Never Used  Vaping Use  . Vaping Use: Never used  Substance Use Topics  . Alcohol use: Yes    Alcohol/week: 0.0 standard drinks    Comment: occ  . Drug use: No    Relationship status: married She lives with husband.   She is not employed. Regular exercise: Yes: walking, yard work History of abuse: No  Family History:   Family History  Problem Relation Age of Onset  .  Dementia Mother   . Osteoporosis Mother   . Breast cancer Sister 6  . Heart attack Brother   . Colon cancer Neg Hx   . Esophageal cancer Neg Hx   . Rectal cancer Neg Hx   . Stomach cancer Neg Hx      Review of Systems: Review of Systems  Constitutional: Negative for fever, malaise/fatigue and weight loss.  Respiratory: Negative for cough, shortness of breath and wheezing.   Cardiovascular: Negative for chest pain, palpitations and  leg swelling.  Gastrointestinal: Negative for abdominal pain and blood in stool.  Genitourinary: Negative for dysuria.  Musculoskeletal: Negative for myalgias.  Skin: Negative for rash.  Neurological: Negative for dizziness and headaches.  Endo/Heme/Allergies: Does not bruise/bleed easily.  Psychiatric/Behavioral: Negative for depression. The patient is nervous/anxious.      OBJECTIVE Physical Exam: Vitals:   03/11/20 1117  BP: (!) 144/88  Pulse: 64  Temp: 98.7 F (37.1 C)  Weight: 132 lb (59.9 kg)  Height: 5\' 5"  (1.651 m)    Physical Exam Constitutional:      General: She is not in acute distress. Pulmonary:     Effort: Pulmonary effort is normal.  Abdominal:     General: There is no distension.     Palpations: Abdomen is soft.     Tenderness: There is no abdominal tenderness. There is no rebound.  Musculoskeletal:        General: No swelling. Normal range of motion.  Skin:    General: Skin is warm and dry.     Findings: No rash.  Neurological:     Mental Status: She is alert and oriented to person, place, and time.  Psychiatric:        Mood and Affect: Mood normal.        Behavior: Behavior normal.      GU / Detailed Urogynecologic Evaluation:  Pelvic Exam: Normal external female genitalia with atrophy around the labia minora, possible lichen slerosus near the anus; Bartholin's and Skene's glands normal in appearance; urethral meatus normal in appearance, no urethral masses or discharge.   CST: negative  s/p hysterectomy: Speculum exam reveals normal vaginal mucosa with  atrophy and normal vaginal cuff.  Adnexa no mass, fullness, tenderness.     Pelvic floor strength I/V  Pelvic floor musculature: Right levator non-tender, Right obturator non-tender, Left levator non-tender, Left obturator non-tender  POP-Q:   POP-Q  0                                            Aa   0                                           Ba  -5.5                                               C   4                                            Gh  4                                            Pb  7                                            tvl   -2                                            Ap  -2                                            Bp                                                 D     Rectal Exam:  Normal external rectum with small 1cm fissure extending to the perineal body. Tissue appears thin over perineal body, possible atrophy vs lichen sclerosus  Post-Void Residual (PVR) by Bladder Scan: In order to evaluate bladder emptying, we discussed obtaining a postvoid residual and she agreed to this procedure.  Procedure: The ultrasound unit was placed on the patient's abdomen in the suprapubic region after the patient had voided. A PVR of 6 ml was obtained by bladder scan.  Laboratory Results: POC urine: negative  I visualized the urine specimen, noting the specimen to be clear yellow  ASSESSMENT AND PLAN Ms. Freimuth is a 69 y.o. with:  1. Prolapse of anterior vaginal wall   2. Vaginal atrophy   3. Prolapse of posterior vaginal wall   4. Frequency of urination     1. Stage II anterior, Stage I posterior, Stage I apical prolapse For treatment of pelvic organ prolapse, we discussed options for management including expectant management, conservative management, and surgical management, such as Kegels, a pessary, pelvic floor physical therapy, and specific surgical procedures. - She is interested in expectant management at this time. Discussed avoiding heavy lifting or straining in order to help prevent progression of prolapse.   2. Vaginal atrophy For symptomatic vaginal atrophy options include lubrication with a water-based lubricant, personal hygiene measures and barrier protection against wetness, and estrogen replacement in the form of vaginal cream, vaginal tablets, or a time-released vaginal ring.   - Will start estrace cream 0.5mg  nightly  for two weeks then twice a week after. Instructed that she can place the cream internally and externally over the vulva and perineum where she feels irritation. On days she is not using estrace cream, she will use coconut oil for moisturizer.  - May have an element of lichen sclerosus over perineum/ anal area. Pt states fissure occurs even though she has regular BM and does not strain. If estrace cream does not improve symptoms, can consider clobetasol ointment to use in that area.   3. Frequency/ urgency - Not bothersome to her at this time.  - POC urine negative for infection,  empties bladder well with normal PVR.   Return as needed.   Jaquita Folds, MD   Medical Decision Making:  - Reviewed/ ordered a clinical laboratory test - Review and summation of prior records - Independent review of urine specimen

## 2020-03-11 ENCOUNTER — Encounter: Payer: Self-pay | Admitting: Obstetrics and Gynecology

## 2020-03-11 ENCOUNTER — Other Ambulatory Visit: Payer: Self-pay

## 2020-03-11 ENCOUNTER — Ambulatory Visit (INDEPENDENT_AMBULATORY_CARE_PROVIDER_SITE_OTHER): Payer: Medicare Other | Admitting: Obstetrics and Gynecology

## 2020-03-11 VITALS — BP 144/88 | HR 64 | Temp 98.7°F | Ht 65.0 in | Wt 132.0 lb

## 2020-03-11 DIAGNOSIS — N8110 Cystocele, unspecified: Secondary | ICD-10-CM

## 2020-03-11 DIAGNOSIS — N952 Postmenopausal atrophic vaginitis: Secondary | ICD-10-CM | POA: Diagnosis not present

## 2020-03-11 DIAGNOSIS — R350 Frequency of micturition: Secondary | ICD-10-CM

## 2020-03-11 DIAGNOSIS — N816 Rectocele: Secondary | ICD-10-CM | POA: Diagnosis not present

## 2020-03-11 LAB — POCT URINALYSIS DIPSTICK
Appearance: NORMAL
Bilirubin, UA: NEGATIVE
Blood, UA: NEGATIVE
Glucose, UA: NEGATIVE
Ketones, UA: NEGATIVE
Leukocytes, UA: NEGATIVE
Nitrite, UA: NEGATIVE
Protein, UA: NEGATIVE
Spec Grav, UA: 1.01 (ref 1.010–1.025)
Urobilinogen, UA: 0.2 E.U./dL
pH, UA: 6.5 (ref 5.0–8.0)

## 2020-03-11 MED ORDER — ESTRADIOL 0.1 MG/GM VA CREA: 0.5000 g | TOPICAL_CREAM | VAGINAL | 11 refills | Status: DC

## 2020-03-11 NOTE — Patient Instructions (Signed)
Start vaginal estrogen therapy nightly for two weeks then 2 times weekly at night for treatment of vaginal atrophy (dryness of the vaginal tissues).  Please let us know if the prescription is too expensive and we can look for alternative options.  For vaginal atrophy (thinning of the vaginal tissue that can cause dryness and burning) and UTI prevention we discussed estrogen replacement in the form of vaginal cream.    Is vaginal estrogen therapy safe for me? Vaginal estrogen preparations act on the vaginal skin, and only a very tiny amount is absorbed into the bloodstream (0.01%).  They work in a similar way to hand or face cream.  There is minimal absorption and they are therefore perfectly safe. If you have had breast cancer and have persistent troublesome symptoms which aren't settling with vaginal moisturisers and lubricants, local estrogen treatment may be a possibility, but consultation with your oncologist should take place first.  Vulvovaginal moisturizer Options: Marland Kitchen Vitamin E oil (pump or capsule) or cream (Gene's Vit E Cream) . Coconut oil . Silicone-based lubricant for use during intercourse ("wet platinum" is a brand available at most drugstores) . Crisco Consider the ingredients of the product - the fewer the ingredients the better!  Directions for Use: Clean and dry your hands Gently dab the vulvar/vaginal area dry as needed Apply a "pea-sized" amount of the moisturizer onto your fingertip Using you other hand, open the labia  Apply the moisturizer to the vulvar/vaginal tissues Wear loose fitting underwear/clothing if possible following application Use moisturize up to 3 times daily as desired.

## 2020-03-15 ENCOUNTER — Encounter: Payer: Self-pay | Admitting: Internal Medicine

## 2020-03-23 DIAGNOSIS — B351 Tinea unguium: Secondary | ICD-10-CM | POA: Diagnosis not present

## 2020-03-23 DIAGNOSIS — L820 Inflamed seborrheic keratosis: Secondary | ICD-10-CM | POA: Diagnosis not present

## 2020-04-01 ENCOUNTER — Ambulatory Visit (INDEPENDENT_AMBULATORY_CARE_PROVIDER_SITE_OTHER): Payer: Medicare Other | Admitting: Family Medicine

## 2020-04-01 ENCOUNTER — Other Ambulatory Visit: Payer: Self-pay

## 2020-04-01 ENCOUNTER — Encounter: Payer: Self-pay | Admitting: Family Medicine

## 2020-04-01 VITALS — BP 140/80 | HR 61 | Ht 65.0 in | Wt 131.0 lb

## 2020-04-01 DIAGNOSIS — M5030 Other cervical disc degeneration, unspecified cervical region: Secondary | ICD-10-CM

## 2020-04-01 DIAGNOSIS — M999 Biomechanical lesion, unspecified: Secondary | ICD-10-CM | POA: Diagnosis not present

## 2020-04-01 NOTE — Assessment & Plan Note (Signed)
Degenerative disc disease.  Responding relatively well though to osteopathic manipulation.  Some mild increase in lower back pain that is secondary to her holding her grandchild on a more regular basis.  Discussed icing regimen and home exercises.  Discussed gabapentin.  Discussed icing regimen.  Follow-up again 4 to 8 weeks.

## 2020-04-01 NOTE — Progress Notes (Signed)
Lindsay [name] [address] [place] Phone: [phone] Subjective:   I Lindsay [name] am serving as a Education administrator for Dr. Hulan Saas.  This visit occurred during the SARS-CoV-2 public health emergency.  Safety protocols were in place, including screening questions prior to the visit, additional usage of staff PPE, and extensive cleaning of exam room while observing appropriate contact time as indicated for disinfecting solutions.   I'm seeing this patient by the request  of:  Binnie Rail, MD  CC: Neck pain follow-up  [id number]  Lindsay [name] is a 69 y.o. female coming in with complaint of back and neck pain. OMT 02/19/2020. Patient states she is doing ok today.  Mild tightness.  No significant radicular symptoms.  Has noticed some mild increase in tenderness in the lower back.  No radiation.  Has been taking care of her grandchild on a more regular basis during the holidays.  Medications patient has been prescribed: Gabapentin  Taking: Yes         Reviewed prior external information including notes and imaging from previsou exam, outside providers and external EMR if available.   As well as notes that were available from care everywhere and other healthcare systems.  Past medical history, social, surgical and family history all reviewed in electronic medical record.  No pertanent information unless stated regarding to the chief complaint.   Past Medical History:  Diagnosis Date  . COMMON MIGRAINE 09/20/2006  . DEPRESSION 06/11/2009  . GERD 06/11/2009  . Hypertension 04/17/2018  . Neck pain    eval with sports medicine in 2016/17  . Osteopenia 04/17/2018    No Known Allergies   Review of Systems:  No headache, visual changes, nausea, vomiting, diarrhea, constipation, dizziness, abdominal pain, skin rash, fevers, chills, night sweats, weight loss, swollen lymph nodes, body aches, joint swelling, chest pain, shortness of breath,  mood changes. POSITIVE muscle aches  Objective  Blood pressure 140/80, pulse 61, height 5\' 5"  (1.651 m), weight 131 lb (59.4 kg), SpO2 98 %.   General: No apparent distress alert and oriented x3 mood and affect normal, dressed appropriately.  HEENT: Pupils equal, extraocular movements intact  Respiratory: Patient's speak in full sentences and does not appear short of breath  Cardiovascular: No lower extremity edema, non tender, no erythema  Low back exam does show some mild tightness in the paraspinal musculature of the lumbar spine.  Right greater than left.  Patient does have significant tightness of the cervical spine with limited sidebending bilaterally right greater than left.  Negative Spurling's today.  Patient has also lacks the last 10 degrees of extension of the neck.  Osteopathic findings  C2 flexed rotated and side bent right C6 flexed rotated and side bent left T3 extended rotated and side bent right inhaled rib T9 extended rotated and side bent left L2 flexed rotated and side bent right Sacrum right on right       Assessment and Plan:  Degenerative cervical disc Degenerative disc disease.  Responding relatively well though to osteopathic manipulation.  Some mild increase in lower back pain that is secondary to her holding her grandchild on a more regular basis.  Discussed icing regimen and home exercises.  Discussed gabapentin.  Discussed icing regimen.  Follow-up again 4 to 8 weeks. Chronic problem with mild exacerbation.  Continue the gabapentin  Nonallopathic problems  Decision today to treat with OMT was based on Physical Exam  After verbal consent patient was treated  with HVLA, ME, FPR techniques in cervical, rib, thoracic, lumbar, and sacral  areas  Patient tolerated the procedure well with improvement in symptoms  Patient given exercises, stretches and lifestyle modifications  See medications in patient instructions if given  Patient will follow up in  4-8 weeks      The above documentation has been reviewed and is accurate and complete Judi Saa, DO       Note: This dictation was prepared with Dragon dictation along with smaller phrase technology. Any transcriptional errors that result from this process are unintentional.

## 2020-04-01 NOTE — Patient Instructions (Addendum)
Good to see you You're great See me again in 6 weeks

## 2020-05-07 ENCOUNTER — Encounter: Payer: Self-pay | Admitting: Family Medicine

## 2020-05-07 ENCOUNTER — Ambulatory Visit: Payer: Medicare HMO | Admitting: Family Medicine

## 2020-05-07 ENCOUNTER — Other Ambulatory Visit: Payer: Self-pay

## 2020-05-07 VITALS — BP 126/84 | HR 64 | Ht 65.0 in | Wt 135.0 lb

## 2020-05-07 DIAGNOSIS — M999 Biomechanical lesion, unspecified: Secondary | ICD-10-CM

## 2020-05-07 DIAGNOSIS — M5030 Other cervical disc degeneration, unspecified cervical region: Secondary | ICD-10-CM

## 2020-05-07 NOTE — Progress Notes (Signed)
[place] Phone: [phone] Subjective:   [name], am serving as a scribe for Dr. Hulan Saas. This visit occurred during the SARS-CoV-2 public health emergency.  Safety protocols were in place, including screening questions prior to the visit, additional usage of staff PPE, and extensive cleaning of exam room while observing appropriate contact time as indicated for disinfecting solutions.  I'm seeing this patient by the request  of:  Binnie Rail, MD  CC: Neck and back pain follow-up  [id number]  Lindsay [name] is a 70 y.o. female coming in with complaint of back and neck pain. OMT 04/01/2020. Patient states she has been having good and bad days. Using gabapentin for relief. Patient states that overall more good days and bad days. Has had some mild increase in stress with a mother and grandbaby on the way. Patient also recently had a crown placed and that has caused some mild discomfort..  Medications patient has been prescribed: Gabapentin  Taking: Yes         Reviewed prior external information including notes and imaging from previsou exam, outside providers and external EMR if available.   As well as notes that were available from care everywhere and other healthcare systems.  Past medical history, social, surgical and family history all reviewed in electronic medical record.  No pertanent information unless stated regarding to the chief complaint.   Past Medical History:  Diagnosis Date  . COMMON MIGRAINE 09/20/2006  . DEPRESSION 06/11/2009  . GERD 06/11/2009  . Hypertension 04/17/2018  . Neck pain    eval with sports medicine in 2016/17  . Osteopenia 04/17/2018    No Known Allergies   Review of Systems:  No headache, visual changes, nausea, vomiting, diarrhea, constipation, dizziness, abdominal pain, skin rash, fevers, chills, night sweats, weight loss, swollen lymph nodes, body  aches, joint swelling, chest pain, shortness of breath, mood changes. POSITIVE muscle aches  Objective  Blood pressure 126/84, pulse 64, height 5\' 5"  (1.651 m), weight 135 lb (61.2 kg), SpO2 99 %.   General: No apparent distress alert and oriented x3 mood and affect normal, dressed appropriately.  HEENT: Pupils equal, extraocular movements intact  Respiratory: Patient's speak in full sentences and does not appear short of breath  Cardiovascular: No lower extremity edema, non tender, no erythema  Gait normal with good balance and coordination.  MSK: Arthritic changes of multiple joints Back -back exam does have some mild loss of lordosis and tightness noted more in the thoracolumbar juncture. Mild discomfort over the sacroiliac joint on the right side.  Neck exam shows some mild loss of lordosis. Some tenderness to palpation of the paraspinal musculature. Negative Spurling's today. Does have limited range of motion in sidebending and rotation bilaterally  Osteopathic findings  C2 flexed rotated and side bent right C6 flexed rotated and side bent left T3 extended rotated and side bent right inhaled rib L1 flexed rotated and side bent right        Assessment and Plan:  Degenerative cervical disc Known degenerative disc but does respond very well to osteopathic manipulation.  We discussed continuing to work on Engineer, building services.  Discussed which activities to do which wants to avoid.  If patient has any increase in radicular symptoms patient may need the possibility of epidurals again.  Follow-up with me again in 6 to 8 weeks    Nonallopathic problems  Decision today to treat with OMT was  based on Physical Exam  After verbal consent patient was treated with  ME, FPR techniques in cervical, rib, thoracic, lumbar,areas  Patient tolerated the procedure well with improvement in symptoms  Patient given exercises, stretches and lifestyle modifications  See medications in patient  instructions if given  Patient will follow up in 4-8 weeks      The above documentation has been reviewed and is accurate and complete [name], DO       Note: This dictation was prepared with Dragon dictation along with smaller phrase technology. Any transcriptional errors that result from this process are unintentional.

## 2020-05-07 NOTE — Patient Instructions (Signed)
Stretches for hip flexor Doing great Can't wait for pics of grandbaby See me again in 6-8 weeks

## 2020-05-07 NOTE — Assessment & Plan Note (Signed)
Known degenerative disc but does respond very well to osteopathic manipulation.  We discussed continuing to work on Engineer, building services.  Discussed which activities to do which wants to avoid.  If patient has any increase in radicular symptoms patient may need the possibility of epidurals again.  Follow-up with me again in 6 to 8 weeks

## 2020-05-14 DIAGNOSIS — H35373 Puckering of macula, bilateral: Secondary | ICD-10-CM | POA: Diagnosis not present

## 2020-05-14 DIAGNOSIS — H35033 Hypertensive retinopathy, bilateral: Secondary | ICD-10-CM | POA: Diagnosis not present

## 2020-05-14 DIAGNOSIS — H35462 Secondary vitreoretinal degeneration, left eye: Secondary | ICD-10-CM | POA: Diagnosis not present

## 2020-05-14 DIAGNOSIS — H43813 Vitreous degeneration, bilateral: Secondary | ICD-10-CM | POA: Diagnosis not present

## 2020-05-29 ENCOUNTER — Telehealth: Payer: Self-pay | Admitting: Internal Medicine

## 2020-05-29 NOTE — Telephone Encounter (Signed)
Patient is requesting a call back at [phone].

## 2020-06-03 NOTE — Telephone Encounter (Signed)
Patient is looking for Lindsay [name] with sports medicine.

## 2020-06-03 NOTE — Telephone Encounter (Signed)
Attempted to call patient back at home number, no answer, left voicemail.

## 2020-06-22 NOTE — Progress Notes (Signed)
[place] Phone: [phone] Subjective:   Lindsay [name], am serving as a scribe for Dr. Hulan Saas. This visit occurred during the SARS-CoV-2 public health emergency.  Safety protocols were in place, including screening questions prior to the visit, additional usage of staff PPE, and extensive cleaning of exam room while observing appropriate contact time as indicated for disinfecting solutions.   I'm seeing this patient by the request  of:  Binnie Rail, MD  CC: Neck pain follow-up  [id number]  Lindsay [name] is a 70 y.o. female coming in with complaint of back and neck pain. OMT 05/07/2020. Patient states overall doing relatively well.  Patient has had some mild neck pain but nothing severe.  Denies any significant radiation down the arm.  Denies that the carpal tunnel is giving her any trouble.  Does get some worsening pain with certain activities such as painting  Medications patient has been prescribed: Gabapentin          Reviewed prior external information including notes and imaging from previsou exam, outside providers and external EMR if available.   As well as notes that were available from care everywhere and other healthcare systems.  Past medical history, social, surgical and family history all reviewed in electronic medical record.  [name] pertanent information unless stated regarding to the chief complaint.   Past Medical History:  Diagnosis Date  . COMMON MIGRAINE 09/20/2006  . DEPRESSION 06/11/2009  . GERD 06/11/2009  . Hypertension 04/17/2018  . Neck pain    eval with sports medicine in 2016/17  . Osteopenia 04/17/2018    [name] Known Allergies   Review of Systems:  [name] headache, visual changes, nausea, vomiting, diarrhea, constipation, dizziness, abdominal pain, skin rash, fevers, chills, night sweats, weight loss, swollen lymph nodes, body aches, joint swelling, chest pain, shortness of  breath, mood changes. POSITIVE muscle aches  Objective  Blood pressure 124/86, pulse 74, height 5\' 5"  (1.651 m), weight 135 lb (61.2 kg), SpO2 99 %.   General: [name] apparent distress alert and oriented x3 mood and affect normal, dressed appropriately.  HEENT: Pupils equal, extraocular movements intact  Respiratory: Patient's speak in full sentences and does not appear short of breath  Cardiovascular: [name] lower extremity edema, non tender, [name] erythema  Gait normal with good balance and coordination.  MSK:  Non tender with full range of motion and good stability and symmetric strength and tone of shoulders, elbows, wrist, hip, knee and ankles bilaterally.  Back -patient does have some tightness in the thoracic spine.  Neck exam does have some mild limited sidebending bilaterally.  Patient has negative Spurling's noted today.  Osteopathic findings  C2 flexed rotated and side bent right C6 flexed rotated and side bent left T3 extended rotated and side bent right inhaled rib T9 extended rotated and side bent left       Assessment and Plan:  Degenerative cervical disc Known degenerative disc disease.  Discussed with patient again at great length.  Doing relatively well.  Not having as much of the radicular symptoms.  Patient does have the gabapentin we discussed posture and ergonomics.  Discussed avoiding certain repetitive activities for taking breaks in between her yard work and painting.  Follow-up with me again in 6 weeks    Nonallopathic problems  Decision today to treat with OMT was based on Physical Exam  After verbal consent patient was treated with HVLA, ME, FPR techniques in cervical,  rib, thoracic  areas  Patient tolerated the procedure well with improvement in symptoms  Patient given exercises, stretches and lifestyle modifications  See medications in patient instructions if given  Patient will follow up in 4-8 weeks      The above documentation has been reviewed and  is accurate and complete [name], DO       Note: This dictation was prepared with Dragon dictation along with smaller phrase technology. Any transcriptional errors that result from this process are unintentional.

## 2020-06-23 ENCOUNTER — Encounter: Payer: Self-pay | Admitting: Family Medicine

## 2020-06-23 ENCOUNTER — Ambulatory Visit (INDEPENDENT_AMBULATORY_CARE_PROVIDER_SITE_OTHER): Payer: Medicare HMO | Admitting: Family Medicine

## 2020-06-23 ENCOUNTER — Ambulatory Visit: Payer: Medicare HMO | Admitting: Physician Assistant

## 2020-06-23 ENCOUNTER — Other Ambulatory Visit: Payer: Self-pay

## 2020-06-23 ENCOUNTER — Encounter: Payer: Self-pay | Admitting: Physician Assistant

## 2020-06-23 VITALS — BP 124/86 | HR 74 | Ht 65.0 in | Wt 135.0 lb

## 2020-06-23 DIAGNOSIS — L710 Perioral dermatitis: Secondary | ICD-10-CM

## 2020-06-23 DIAGNOSIS — Z1283 Encounter for screening for malignant neoplasm of skin: Secondary | ICD-10-CM

## 2020-06-23 DIAGNOSIS — M999 Biomechanical lesion, unspecified: Secondary | ICD-10-CM

## 2020-06-23 DIAGNOSIS — M5030 Other cervical disc degeneration, unspecified cervical region: Secondary | ICD-10-CM

## 2020-06-23 DIAGNOSIS — D1800 Hemangioma unspecified site: Secondary | ICD-10-CM

## 2020-06-23 DIAGNOSIS — L821 Other seborrheic keratosis: Secondary | ICD-10-CM | POA: Diagnosis not present

## 2020-06-23 DIAGNOSIS — L570 Actinic keratosis: Secondary | ICD-10-CM | POA: Diagnosis not present

## 2020-06-23 MED ORDER — MINOCYCLINE HCL 50 MG PO CAPS: 50.0000 mg | ORAL_CAPSULE | Freq: Two times a day (BID) | ORAL | 2 refills | Status: DC

## 2020-06-23 NOTE — Assessment & Plan Note (Signed)
Known degenerative disc disease.  Discussed with patient again at great length.  Doing relatively well.  Not having as much of the radicular symptoms.  Patient does have the gabapentin we discussed posture and ergonomics.  Discussed avoiding certain repetitive activities for taking breaks in between her yard work and painting.  Follow-up with me again in 6 weeks

## 2020-06-23 NOTE — Progress Notes (Signed)
   Follow-Up Visit   Subjective  Lindsay [name] is a 70 y.o. female who presents for the following: Annual Exam (Right jawline nonhealing x months also recheck back new spot under bra upper thigh post bumps? Left front thigh maybe mole or tag).   The following portions of the chart were reviewed this encounter and updated as appropriate:  Tobacco  Allergies  Meds  Problems  Med Hx  Surg Hx  Fam Hx      Objective  Well appearing patient in no apparent distress; mood and affect are within normal limits.  A full examination was performed including scalp, head, eyes, ears, nose, lips, neck, chest, axillae, abdomen, back, buttocks, bilateral upper extremities, bilateral lower extremities, hands, feet, fingers, toes, fingernails, and toenails. All findings within normal limits unless otherwise noted below.  Objective  head to toe: No atypical nevi No signs of non-mole skin cancer. Full body skin examination  Objective  Right Buccal Cheek: Thin scaly erythematous papules with xerosis.  Objective  Right Thigh - Anterior: Multiple red raised papules  Objective  Right Abdomen (side) - Upper: Multiple Stuck-on, waxy, tan-brown papules and plaques. --Discussed benign etiology and prognosis.   Objective  Right Upper Arm - Anterior: Erythematous patches with gritty scale.   Assessment & Plan  Encounter for screening for malignant neoplasm of skin head to toe  Yearly skin check  Perioral dermatitis Right Buccal Cheek  minocycline (MINOCIN) 50 MG capsule - Right Buccal Cheek  Hemangioma, unspecified site Right Thigh - Anterior  Okay to leave if stable  Seborrheic keratosis Right Abdomen (side) - Upper  Okay to leave if stable  AK (actinic keratosis) Right Upper Arm - Anterior  Destruction of lesion - Right Upper Arm - Anterior Complexity: simple   Destruction method: cryotherapy   Informed consent: discussed and consent obtained   Timeout:  patient name, date of  birth, surgical site, and procedure verified Lesion destroyed using liquid nitrogen: Yes   Cryotherapy cycles:  3 Outcome: patient tolerated procedure well with no complications      I, Lyzbeth Genrich, PA-C, have reviewed all documentation's for this visit.  The documentation on 06/23/20 for the exam, diagnosis, procedures and orders are all accurate and complete.

## 2020-06-23 NOTE — Patient Instructions (Signed)
Good to see you Tell Milta Deiters he is doing all the yard work today while you sit with lemonade Continue everything else  See me again in 6 weeks

## 2020-06-24 ENCOUNTER — Ambulatory Visit: Payer: Medicare HMO | Admitting: Family Medicine

## 2020-06-29 ENCOUNTER — Other Ambulatory Visit: Payer: Self-pay | Admitting: Internal Medicine

## 2020-06-29 DIAGNOSIS — Z1231 Encounter for screening mammogram for malignant neoplasm of breast: Secondary | ICD-10-CM

## 2020-07-02 ENCOUNTER — Other Ambulatory Visit (INDEPENDENT_AMBULATORY_CARE_PROVIDER_SITE_OTHER): Payer: Medicare HMO

## 2020-07-02 ENCOUNTER — Other Ambulatory Visit: Payer: Self-pay

## 2020-07-02 DIAGNOSIS — I10 Essential (primary) hypertension: Secondary | ICD-10-CM | POA: Diagnosis not present

## 2020-07-02 DIAGNOSIS — M85851 Other specified disorders of bone density and structure, right thigh: Secondary | ICD-10-CM

## 2020-07-02 DIAGNOSIS — M85852 Other specified disorders of bone density and structure, left thigh: Secondary | ICD-10-CM | POA: Diagnosis not present

## 2020-07-02 LAB — CBC WITH DIFFERENTIAL/PLATELET
Basophils Absolute: 0 10*3/uL (ref 0.0–0.1)
Basophils Relative: 1.4 % (ref 0.0–3.0)
Eosinophils Absolute: 0.2 10*3/uL (ref 0.0–0.7)
Eosinophils Relative: 5.5 % — ABNORMAL HIGH (ref 0.0–5.0)
HCT: 42.4 % (ref 36.0–46.0)
Hemoglobin: 14.2 g/dL (ref 12.0–15.0)
Lymphocytes Relative: 44.5 % (ref 12.0–46.0)
Lymphs Abs: 1.6 10*3/uL (ref 0.7–4.0)
MCHC: 33.5 g/dL (ref 30.0–36.0)
MCV: 93.8 fl (ref 78.0–100.0)
Monocytes Absolute: 0.4 10*3/uL (ref 0.1–1.0)
Monocytes Relative: 11.2 % (ref 3.0–12.0)
Neutro Abs: 1.3 10*3/uL — ABNORMAL LOW (ref 1.4–7.7)
Neutrophils Relative %: 37.4 % — ABNORMAL LOW (ref 43.0–77.0)
Platelets: 287 10*3/uL (ref 150.0–400.0)
RBC: 4.52 Mil/uL (ref 3.87–5.11)
RDW: 12.7 % (ref 11.5–15.5)
WBC: 3.6 10*3/uL — ABNORMAL LOW (ref 4.0–10.5)

## 2020-07-02 LAB — LIPID PANEL
Cholesterol: 196 mg/dL (ref 0–200)
HDL: 79.4 mg/dL (ref >39.00)
LDL Cholesterol: 102 mg/dL — ABNORMAL HIGH (ref 0–99)
NonHDL: 116.62
Total CHOL/HDL Ratio: 2
Triglycerides: 74 mg/dL (ref 0.0–149.0)
VLDL: 14.8 mg/dL (ref 0.0–40.0)

## 2020-07-02 LAB — VITAMIN D 25 HYDROXY (VIT D DEFICIENCY, FRACTURES): VITD: 39.61 ng/mL (ref 30.00–100.00)

## 2020-07-02 LAB — COMPREHENSIVE METABOLIC PANEL
ALT: 21 U/L (ref 0–35)
AST: 20 U/L (ref 0–37)
Albumin: 4.3 g/dL (ref 3.5–5.2)
Alkaline Phosphatase: 67 U/L (ref 39–117)
BUN: 20 mg/dL (ref 6–23)
CO2: 30 mEq/L (ref 19–32)
Calcium: 9.3 mg/dL (ref 8.4–10.5)
Chloride: 105 mEq/L (ref 96–112)
Creatinine, Ser: 0.85 mg/dL (ref 0.40–1.20)
GFR: 70.76 mL/min (ref >60.00)
Glucose, Bld: 85 mg/dL (ref 70–99)
Potassium: 3.7 mEq/L (ref 3.5–5.1)
Sodium: 141 mEq/L (ref 135–145)
Total Bilirubin: 0.6 mg/dL (ref 0.2–1.2)
Total Protein: 6.7 g/dL (ref 6.0–8.3)

## 2020-07-02 LAB — TSH: TSH: 2.21 u[IU]/mL (ref 0.35–4.50)

## 2020-07-02 NOTE — Addendum Note (Signed)
Addended by: Jacobo Forest on: 07/02/2020 08:05 AM   Modules accepted: Orders

## 2020-07-06 ENCOUNTER — Other Ambulatory Visit: Payer: Self-pay

## 2020-07-06 NOTE — Progress Notes (Signed)
Subjective:    Patient ID: Lindsay [name], female    DOB: [birth year], 70 y.o.   MRN: [id number]   This visit occurred during the SARS-CoV-2 public health emergency.  Safety protocols were in place, including screening questions prior to the visit, additional usage of staff PPE, and extensive cleaning of exam room while observing appropriate contact time as indicated for disinfecting solutions.    HPI She is here for a physical exam.   Overall she feels well and has no concerns.  She has had her blood work done already.  Medications and allergies reviewed with patient and updated if appropriate.  Patient Active Problem List   Diagnosis Date Noted  . Anal fissure 12/17/2019  . Nondisplaced fracture of lateral malleolus of right fibula, initial encounter for closed fracture 06/11/2019  . Depression with anxiety 09/20/2018  . Right carpal tunnel syndrome 08/03/2018  . Retinal hemorrhage 05/02/2018  . Hypertension 04/17/2018  . Osteopenia 04/17/2018  . Degeneration of lateral meniscus of right knee 05/05/2015  . Degenerative cervical disc 12/03/2014  . Nonallopathic lesion of cervical region 12/03/2014  . Nonallopathic lesion of thoracic region 12/03/2014  . Nonallopathic lesion-rib cage 12/03/2014  . GERD 06/11/2009  . NECK PAIN 07/21/2008  . Migraine without aura 09/20/2006    Current Outpatient Medications on File Prior to Visit  Medication Sig Dispense Refill  . Cholecalciferol (VITAMIN D-3 PO) Take 1 capsule by mouth daily.    . Cyanocobalamin (VITAMIN B-12 PO) Take by mouth.    . diphenhydramine-acetaminophen (TYLENOL PM) 25-500 MG TABS Take 2 tablets by mouth at bedtime as needed (for sleep).     . docusate sodium (COLACE) 100 MG capsule Take 200 mg by mouth at bedtime.    Marland Kitchen estradiol (ESTRACE) 0.1 MG/GM vaginal cream Place 0.5 g vaginally 2 (two) times a week. Place 0.5g nightly for two weeks then twice a week after 30 g 11  . fish oil-omega-3 fatty acids 1000 MG  capsule Take 1 g by mouth daily.    Marland Kitchen gabapentin (NEURONTIN) 100 MG capsule TAKE 2 CAPSULES BY MOUTH AT BEDTIME. 180 capsule 0  . hydrocortisone (ANUSOL-HC) 2.5 % rectal cream Place 1 application rectally 2 (two) times daily. 30 g 0  . Inositol Niacinate (NO FLUSH NIACIN) 250 MG TABS Take 250 mg by mouth in the morning.    Marland Kitchen MAGNESIUM PO Take 1 tablet by mouth at bedtime.     . minocycline (MINOCIN) 50 MG capsule Take 1 capsule (50 mg total) by mouth 2 (two) times daily. 60 capsule 2  . Misc Natural Products (TART CHERRY ADVANCED PO) Take 1 tablet by mouth at bedtime.     . SUMAtriptan (IMITREX) 100 MG tablet TAKE 1/2-1 TABLET BY MOUTH AT ONSET MIGRAINE,MAY REPEAT ONCE 2HOURS IF NEED,MAX 2DOSES IN 24 HOURS 9 tablet 3  . Turmeric (QC TUMERIC COMPLEX PO) Take by mouth.     No current facility-administered medications on file prior to visit.    Past Medical History:  Diagnosis Date  . COMMON MIGRAINE 09/20/2006  . DEPRESSION 06/11/2009  . GERD 06/11/2009  . Hypertension 04/17/2018  . Neck pain    eval with sports medicine in 2016/17  . Osteopenia 04/17/2018    Past Surgical History:  Procedure Laterality Date  . ABDOMINAL HYSTERECTOMY    . BILATERAL OOPHORECTOMY    . CHOLECYSTECTOMY    . OVARIAN CYST REMOVAL    . REPAIR EXTENSOR TENDON HAND    . TONSILLECTOMY  Social History   Socioeconomic History  . Marital status: Married    Spouse name: Not on file  . Number of children: Not on file  . Years of education: Not on file  . Highest education level: Not on file  Occupational History  . Not on file  Tobacco Use  . Smoking status: Former Smoker    Types: Cigarettes    Quit date: 04/04/2004    Years since quitting: 16.2  . Smokeless tobacco: Never Used  Vaping Use  . Vaping Use: Never used  Substance and Sexual Activity  . Alcohol use: Yes    Alcohol/week: 0.0 standard drinks    Comment: occ  . Drug use: No  . Sexual activity: Not Currently  Other Topics Concern  .  Not on file  Social History Narrative  . Not on file   Social Determinants of Health   Financial Resource Strain: Not on file  Food Insecurity: Not on file  Transportation Needs: Not on file  Physical Activity: Not on file  Stress: Not on file  Social Connections: Not on file    Family History  Problem Relation Age of Onset  . Dementia Mother   . Osteoporosis Mother   . Breast cancer Sister 37  . Heart attack Brother   . Colon cancer Neg Hx   . Esophageal cancer Neg Hx   . Rectal cancer Neg Hx   . Stomach cancer Neg Hx     Review of Systems  Constitutional: Negative for chills and fever.  Eyes: Negative for visual disturbance.  Respiratory: Positive for cough (dry throat). Negative for shortness of breath and wheezing.   Cardiovascular: Negative for chest pain, palpitations and leg swelling.  Gastrointestinal: Negative for abdominal pain, blood in stool, constipation, diarrhea and nausea.       Occ GERD  Genitourinary: Negative for dysuria and hematuria.  Musculoskeletal: Positive for arthralgias (hands) and neck pain.  Skin: Negative for color change and rash.  Neurological: Positive for headaches. Negative for light-headedness.  Psychiatric/Behavioral: Positive for dysphoric mood. The patient is nervous/anxious.        Objective:   Vitals:   07/07/20 0952  BP: 128/78  Pulse: (!) 51  Temp: 98.2 F (36.8 C)  SpO2: 99%   Filed Weights   07/07/20 0952  Weight: 132 lb (59.9 kg)   Body mass index is 21.97 kg/m.  BP Readings from Last 3 Encounters:  07/07/20 128/78  06/23/20 124/86  05/07/20 126/84    Wt Readings from Last 3 Encounters:  07/07/20 132 lb (59.9 kg)  06/23/20 135 lb (61.2 kg)  05/07/20 135 lb (61.2 kg)   Depression screen [facility] 2/9 07/07/2020 02/25/2020 07/04/2019 08/21/2018 04/17/2018  Decreased Interest 0 0 0 0 0  Down, Depressed, Hopeless 1 0 0 0 0  PHQ - 2 Score 1 0 0 0 0  Altered sleeping 0 0 0 0 -  Tired, decreased energy 1 0 0 0 -   Change in appetite 0 0 0 0 -  Feeling bad or failure about yourself  1 0 0 0 -  Trouble concentrating 0 0 0 0 -  Moving slowly or fidgety/restless 0 0 0 0 -  Suicidal thoughts 0 0 0 0 -  PHQ-9 Score 3 0 0 0 -  Difficult doing work/chores Not difficult at all - Not difficult at all - -  Some recent data might be hidden    GAD 7 : Generalized Anxiety Score 07/07/2020  Nervous, Anxious, on  Edge 1  Control/stop worrying 0  Worry too much - different things 1  Trouble relaxing 1  Restless 0  Easily annoyed or irritable 1  Afraid - awful might happen 1  Total GAD 7 Score 5  Anxiety Difficulty Not difficult at all         Physical Exam Constitutional: She appears well-developed and well-nourished. No distress.  HENT:  Head: Normocephalic and atraumatic.  Right Ear: External ear normal. Normal ear canal and TM Left Ear: External ear normal.  Normal ear canal and TM Mouth/Throat: Oropharynx is clear and moist.  Eyes: Conjunctivae and EOM are normal.  Neck: Neck supple. No tracheal deviation present. No thyromegaly present.  No carotid bruit  Cardiovascular: Normal rate, regular rhythm and normal heart sounds.   No murmur heard.  No edema. Pulmonary/Chest: Effort normal and breath sounds normal. No respiratory distress. She has no wheezes. She has no rales.  Breast: deferred   Abdominal: Soft. She exhibits no distension. There is no tenderness.  Lymphadenopathy: She has no cervical adenopathy.  Skin: Skin is warm and dry. She is not diaphoretic.  Psychiatric: She has a normal mood and affect. Her behavior is normal.   Lab Results  Component Value Date   WBC 3.6 (L) 07/02/2020   HGB 14.2 07/02/2020   HCT 42.4 07/02/2020   PLT 287.0 07/02/2020   GLUCOSE 85 07/02/2020   CHOL 196 07/02/2020   TRIG 74.0 07/02/2020   HDL 79.40 07/02/2020   LDLDIRECT 125.5 12/28/2011   LDLCALC 102 (H) 07/02/2020   ALT 21 07/02/2020   AST 20 07/02/2020   NA 141 07/02/2020   K 3.7 07/02/2020    CL 105 07/02/2020   CREATININE 0.85 07/02/2020   BUN 20 07/02/2020   CO2 30 07/02/2020   TSH 2.21 07/02/2020   HGBA1C 5.6 08/15/2017        Assessment & Plan:   Physical exam: Screening blood work    reviewed Immunizations  Had covid boosters x 3, shingrix Colonoscopy  Up to date  Mammogram  Up to date and scheduled Gyn  Saw urogyn recently Dexa  BC - up to date Eye exams  Up to date  Exercise  Walking 2 miles three days a week Weight  normal Substance abuse  none      See Problem List for Assessment and Plan of chronic medical problems.

## 2020-07-06 NOTE — Patient Instructions (Addendum)
Your blood work was reviewed.     Medications changes include :   none     Please followup in 1 year    Health Maintenance, Female Adopting a healthy lifestyle and getting preventive care are important in promoting health and wellness. Ask your health care provider about:  The right schedule for you to have regular tests and exams.  Things you can do on your own to prevent diseases and keep yourself healthy. What should I know about diet, weight, and exercise? Eat a healthy diet  Eat a diet that includes plenty of vegetables, fruits, low-fat dairy products, and lean protein.  Do not eat a lot of foods that are high in solid fats, added sugars, or sodium.   Maintain a healthy weight Body mass index (BMI) is used to identify weight problems. It estimates body fat based on height and weight. Your health care provider can help determine your BMI and help you achieve or maintain a healthy weight. Get regular exercise Get regular exercise. This is one of the most important things you can do for your health. Most adults should:  Exercise for at least 150 minutes each week. The exercise should increase your heart rate and make you sweat (moderate-intensity exercise).  Do strengthening exercises at least twice a week. This is in addition to the moderate-intensity exercise.  Spend less time sitting. Even light physical activity can be beneficial. Watch cholesterol and blood lipids Have your blood tested for lipids and cholesterol at 68 years of age, then have this test every 5 years. Have your cholesterol levels checked more often if:  Your lipid or cholesterol levels are high.  You are older than 68 years of age.  You are at high risk for heart disease. What should I know about cancer screening? Depending on your health history and family history, you may need to have cancer screening at various ages. This may include screening for:  Breast cancer.  Cervical  cancer.  Colorectal cancer.  Skin cancer.  Lung cancer. What should I know about heart disease, diabetes, and high blood pressure? Blood pressure and heart disease  High blood pressure causes heart disease and increases the risk of stroke. This is more likely to develop in people who have high blood pressure readings, are of African descent, or are overweight.  Have your blood pressure checked: ? Every 3-5 years if you are 35-73 years of age. ? Every year if you are 38 years old or older. Diabetes Have regular diabetes screenings. This checks your fasting blood sugar level. Have the screening done:  Once every three years after age 56 if you are at a normal weight and have a low risk for diabetes.  More often and at a younger age if you are overweight or have a high risk for diabetes. What should I know about preventing infection? Hepatitis B If you have a higher risk for hepatitis B, you should be screened for this virus. Talk with your health care provider to find out if you are at risk for hepatitis B infection. Hepatitis C Testing is recommended for:  Everyone born from [birth year] through 1963.  Anyone with known risk factors for hepatitis C. Sexually transmitted infections (STIs)  Get screened for STIs, including gonorrhea and chlamydia, if: ? You are sexually active and are younger than 68 years of age. ? You are older than 68 years of age and your health care provider tells you that you are at risk for this type  of infection. ? Your sexual activity has changed since you were last screened, and you are at increased risk for chlamydia or gonorrhea. Ask your health care provider if you are at risk.  Ask your health care provider about whether you are at high risk for HIV. Your health care provider may recommend a prescription medicine to help prevent HIV infection. If you choose to take medicine to prevent HIV, you should first get tested for HIV. You should then be tested every 3  months for as long as you are taking the medicine. Pregnancy  If you are about to stop having your period (premenopausal) and you may become pregnant, seek counseling before you get pregnant.  Take 400 to 800 micrograms (mcg) of folic acid every day if you become pregnant.  Ask for birth control (contraception) if you want to prevent pregnancy. Osteoporosis and menopause Osteoporosis is a disease in which the bones lose minerals and strength with aging. This can result in bone fractures. If you are 86 years old or older, or if you are at risk for osteoporosis and fractures, ask your health care provider if you should:  Be screened for bone loss.  Take a calcium or vitamin D supplement to lower your risk of fractures.  Be given hormone replacement therapy (HRT) to treat symptoms of menopause. Follow these instructions at home: Lifestyle  Do not use any products that contain nicotine or tobacco, such as cigarettes, e-cigarettes, and chewing tobacco. If you need help quitting, ask your health care provider.  Do not use street drugs.  Do not share needles.  Ask your health care provider for help if you need support or information about quitting drugs. Alcohol use  Do not drink alcohol if: ? Your health care provider tells you not to drink. ? You are pregnant, may be pregnant, or are planning to become pregnant.  If you drink alcohol: ? Limit how much you use to 0-1 drink a day. ? Limit intake if you are breastfeeding.  Be aware of how much alcohol is in your drink. In the [place] one drink equals one 12 oz bottle of beer (355 mL), one 5 oz glass of wine (148 mL), or one 1 oz glass of hard liquor (44 mL). General instructions  Schedule regular health, dental, and eye exams.  Stay current with your vaccines.  Tell your health care provider if: ? You often feel depressed. ? You have ever been abused or do not feel safe at home. Summary  Adopting a healthy lifestyle and getting  preventive care are important in promoting health and wellness.  Follow your health care provider's instructions about healthy diet, exercising, and getting tested or screened for diseases.  Follow your health care provider's instructions on monitoring your cholesterol and blood pressure. This information is not intended to replace advice given to you by your health care provider. Make sure you discuss any questions you have with your health care provider. Document Revised: 03/14/2018 Document Reviewed: 03/14/2018 Elsevier Patient Education  2021 [employer].

## 2020-07-07 ENCOUNTER — Ambulatory Visit (INDEPENDENT_AMBULATORY_CARE_PROVIDER_SITE_OTHER): Payer: Medicare HMO | Admitting: Internal Medicine

## 2020-07-07 ENCOUNTER — Encounter: Payer: Self-pay | Admitting: Internal Medicine

## 2020-07-07 VITALS — BP 128/78 | HR 51 | Temp 98.2°F | Ht 65.0 in | Wt 132.0 lb

## 2020-07-07 DIAGNOSIS — Z0000 Encounter for general adult medical examination without abnormal findings: Secondary | ICD-10-CM

## 2020-07-07 DIAGNOSIS — M85852 Other specified disorders of bone density and structure, left thigh: Secondary | ICD-10-CM

## 2020-07-07 DIAGNOSIS — M85851 Other specified disorders of bone density and structure, right thigh: Secondary | ICD-10-CM | POA: Diagnosis not present

## 2020-07-07 DIAGNOSIS — K219 Gastro-esophageal reflux disease without esophagitis: Secondary | ICD-10-CM | POA: Diagnosis not present

## 2020-07-07 DIAGNOSIS — G43009 Migraine without aura, not intractable, without status migrainosus: Secondary | ICD-10-CM

## 2020-07-07 DIAGNOSIS — I10 Essential (primary) hypertension: Secondary | ICD-10-CM

## 2020-07-07 DIAGNOSIS — F418 Other specified anxiety disorders: Secondary | ICD-10-CM

## 2020-07-07 DIAGNOSIS — R69 Illness, unspecified: Secondary | ICD-10-CM | POA: Diagnosis not present

## 2020-07-07 MED ORDER — BUPROPION HCL ER (SR) 150 MG PO TB12: 150.0000 mg | ORAL_TABLET | Freq: Two times a day (BID) | ORAL | 3 refills | Status: DC

## 2020-07-07 MED ORDER — AMLODIPINE BESYLATE 5 MG PO TABS: 1.0000 | ORAL_TABLET | Freq: Every day | ORAL | 3 refills | Status: DC

## 2020-07-07 NOTE — Assessment & Plan Note (Signed)
Chronic Infrequent migraine Takes half of the Imitrex-50 mg as needed, which is not often.  Medication works well and okay to continue

## 2020-07-07 NOTE — Assessment & Plan Note (Addendum)
Chronic Both depression and anxiety controlled and stable  depression screening done with PHQ-9 score of 3 Anxiety screening done with gad 7 with a score of 5 She is happy with her current medication and dose Continue Wellbutrin 150 mg twice daily

## 2020-07-07 NOTE — Assessment & Plan Note (Signed)
Chronic DEXA up-to-date Continue regular walking Continue calcium 600 mg daily and vitamin D daily She will make sure she is getting a good amount of calcium in her diet regularly

## 2020-07-07 NOTE — Assessment & Plan Note (Signed)
Chronic gerd intermittently Continue pepcid 20 mg prn

## 2020-07-07 NOTE — Assessment & Plan Note (Signed)
Chronic BP well controlled Continue amlodipine 5 mg daily cmp reviewed and within normal limits

## 2020-07-30 NOTE — Progress Notes (Signed)
[facility] Phone: [phone] Subjective:   Lindsay [name], am serving as a scribe for Dr. Hulan Saas. This visit occurred during the SARS-CoV-2 public health emergency.  Safety protocols were in place, including screening questions prior to the visit, additional usage of staff PPE, and extensive cleaning of exam room while observing appropriate contact time as indicated for disinfecting solutions.   I'm seeing this patient by the request  of:  Binnie Rail, MD  CC: Upper back and neck pain follow-up  [id number]  Lindsay [name] is a 70 y.o. female coming in with complaint of back and neck pain. OMT 06/23/2020. Patient states that she has been weight training by lifting and hold her grandson.  Feels like the positioning has caused some mild exacerbation mostly of the neck and the upper back.  Seems right better than left.  States that there is some increasing radicular symptoms down the arm.  Seems to be more of this a lot of the carpal tunnel and does not know how much the neck is playing a role.  Medications patient has been prescribed: Gabapentin  Taking:         Reviewed prior external information including notes and imaging from previsou exam, outside providers and external EMR if available.   As well as notes that were available from care everywhere and other healthcare systems.  Past medical history, social, surgical and family history all reviewed in electronic medical record.  [name] pertanent information unless stated regarding to the chief complaint.   Past Medical History:  Diagnosis Date  . COMMON MIGRAINE 09/20/2006  . DEPRESSION 06/11/2009  . GERD 06/11/2009  . Hypertension 04/17/2018  . Neck pain    eval with sports medicine in 2016/17  . Osteopenia 04/17/2018    [name] Known Allergies   Review of Systems:  [name] headache, visual changes, nausea, vomiting, diarrhea, constipation, dizziness,  abdominal pain, skin rash, fevers, chills, night sweats, weight loss, swollen lymph nodes, body aches, joint swelling, chest pain, shortness of breath, mood changes. POSITIVE muscle aches  Objective  Blood pressure 122/84, pulse (!) 59, height 5\' 5"  (1.651 m), weight 129 lb (58.5 kg), SpO2 99 %.   General: [name] apparent distress alert and oriented x3 mood and affect normal, dressed appropriately.  HEENT: Pupils equal, extraocular movements intact  Respiratory: Patient's speak in full sentences and does not appear short of breath  Cardiovascular: [name] lower extremity edema, non tender, [name] erythema   Gait normal with good balance and coordination.  MSK:  Non tender with full range of motion and good stability and symmetric strength and tone of shoulders, elbows, wrist, hip, knee and ankles bilaterally.  Mild to moderate arthritic changes of multiple joints Back -neck exam does have some loss of lordosis.  Mild positive radicular symptoms on the right side with the Spurling's test.  Patient does have a bone spur noted at the C5 level.  Right-sided.  Patient's grip strength is good.  Patient does have arthritic changes of the hands noted as well.  Tightness in the parascapular region as well noted.  Osteopathic findings  C2 flexed rotated and side bent right C6 flexed rotated and side bent right T4 extended rotated and side bent right inhaled rib T9 extended rotated and side bent left       Assessment and Plan:  Degenerative cervical disc Significant arthritic changes.  Mild chronic condition with some mild exacerbation.  Does have  the gabapentin.  He does also have some carpal tunnel that we will need to continue to monitor.  Discussed posture and ergonomics.  Worsening pain consider possible injections again.  Finding relatively well to osteopathic manipulation.  Follow-up with me again 6 weeks    Nonallopathic problems  Decision today to treat with OMT was based on Physical Exam  After  verbal consent patient was treated with HVLA, ME, FPR techniques in cervical, rib, thoracic,  areas  Patient tolerated the procedure well with improvement in symptoms  Patient given exercises, stretches and lifestyle modifications  See medications in patient instructions if given  Patient will follow up in 4-8 weeks      The above documentation has been reviewed and is accurate and complete Lindsay Pulley, DO       Note: This dictation was prepared with Dragon dictation along with smaller phrase technology. Any transcriptional errors that result from this process are unintentional.

## 2020-08-04 ENCOUNTER — Other Ambulatory Visit: Payer: Self-pay

## 2020-08-04 ENCOUNTER — Encounter: Payer: Self-pay | Admitting: Family Medicine

## 2020-08-04 ENCOUNTER — Ambulatory Visit: Payer: Medicare HMO | Admitting: Family Medicine

## 2020-08-04 VITALS — BP 122/84 | HR 59 | Ht 65.0 in | Wt 129.0 lb

## 2020-08-04 DIAGNOSIS — M5030 Other cervical disc degeneration, unspecified cervical region: Secondary | ICD-10-CM | POA: Diagnosis not present

## 2020-08-04 DIAGNOSIS — M9902 Segmental and somatic dysfunction of thoracic region: Secondary | ICD-10-CM

## 2020-08-04 DIAGNOSIS — M9908 Segmental and somatic dysfunction of rib cage: Secondary | ICD-10-CM

## 2020-08-04 DIAGNOSIS — M9901 Segmental and somatic dysfunction of cervical region: Secondary | ICD-10-CM

## 2020-08-04 NOTE — Assessment & Plan Note (Signed)
Significant arthritic changes.  Mild chronic condition with some mild exacerbation.  Does have the gabapentin.  He does also have some carpal tunnel that we will need to continue to monitor.  Discussed posture and ergonomics.  Worsening pain consider possible injections again.  Finding relatively well to osteopathic manipulation.  Follow-up with me again 6 weeks

## 2020-08-04 NOTE — Patient Instructions (Signed)
Be careful with Big Baby Butt (I'm not talking about Milta Deiters) See me again in 6 weeks

## 2020-08-11 ENCOUNTER — Other Ambulatory Visit: Payer: Self-pay

## 2020-08-11 ENCOUNTER — Encounter: Payer: Self-pay | Admitting: Internal Medicine

## 2020-08-11 ENCOUNTER — Ambulatory Visit (INDEPENDENT_AMBULATORY_CARE_PROVIDER_SITE_OTHER): Payer: Medicare HMO | Admitting: Internal Medicine

## 2020-08-11 DIAGNOSIS — I10 Essential (primary) hypertension: Secondary | ICD-10-CM

## 2020-08-11 DIAGNOSIS — S81851A Open bite, right lower leg, initial encounter: Secondary | ICD-10-CM | POA: Diagnosis not present

## 2020-08-11 DIAGNOSIS — W5501XA Bitten by cat, initial encounter: Secondary | ICD-10-CM | POA: Diagnosis not present

## 2020-08-11 MED ORDER — AMOXICILLIN-POT CLAVULANATE 875-125 MG PO TABS: 1.0000 | ORAL_TABLET | Freq: Two times a day (BID) | ORAL | 0 refills | Status: DC

## 2020-08-11 NOTE — Patient Instructions (Signed)
Please take all new medication as prescribed  Please continue all other medications as before, and refills have been done if requested.  Please have the pharmacy call with any other refills you may need.  Please keep your appointments with your specialists as you may have planned     

## 2020-08-17 ENCOUNTER — Other Ambulatory Visit: Payer: Self-pay | Admitting: Internal Medicine

## 2020-08-18 ENCOUNTER — Encounter: Payer: Self-pay | Admitting: Internal Medicine

## 2020-08-18 DIAGNOSIS — W5501XA Bitten by cat, initial encounter: Secondary | ICD-10-CM | POA: Insufficient documentation

## 2020-08-18 NOTE — Assessment & Plan Note (Signed)
Mild to mod, for antibx course,  to f/u any worsening symptoms or concerns 

## 2020-08-18 NOTE — Assessment & Plan Note (Signed)
BP Readings from Last 3 Encounters:  08/11/20 130/82  08/04/20 122/84  07/07/20 128/78   Stable, pt to continue medical treatment - norvasc

## 2020-08-18 NOTE — Progress Notes (Signed)
Patient ID: Lindsay [name], female   DOB: June 09, 1950, 70 y.o.   MRN: [id number]        Chief Complaint: cat bite       HPI:  Lindsay [name] is a 70 y.o. female here after unfortunate cat bite to right mid lower leg, just sort of an accident when she sort of ran into the cat who has nipped her in the past but never bittin; pt thinks a tooth sort of got hooked and maybe not a real bite, but anyway now there is red, tender swelling for the first time not seen with scratches in the past.  No red streaks or drainage or fever, chills.  Pt denies chest pain, increased sob or doe, wheezing, orthopnea, PND, increased LE swelling, palpitations, dizziness or syncope.       Wt Readings from Last 3 Encounters:  08/11/20 130 lb (59 kg)  08/04/20 129 lb (58.5 kg)  07/07/20 132 lb (59.9 kg)   BP Readings from Last 3 Encounters:  08/11/20 130/82  08/04/20 122/84  07/07/20 128/78         Past Medical History:  Diagnosis Date  . COMMON MIGRAINE 09/20/2006  . DEPRESSION 06/11/2009  . GERD 06/11/2009  . Hypertension 04/17/2018  . Neck pain    eval with sports medicine in 2016/17  . Osteopenia 04/17/2018   Past Surgical History:  Procedure Laterality Date  . ABDOMINAL HYSTERECTOMY    . BILATERAL OOPHORECTOMY    . CHOLECYSTECTOMY    . OVARIAN CYST REMOVAL    . REPAIR EXTENSOR TENDON HAND    . TONSILLECTOMY      reports that she quit smoking about 16 years ago. Her smoking use included cigarettes. She has never used smokeless tobacco. She reports current alcohol use. She reports that she does not use drugs. family history includes Breast cancer (age of onset: 56) in her sister; Dementia in her mother; Heart attack in her brother; Osteoporosis in her mother. No Known Allergies Current Outpatient Medications on File Prior to Visit  Medication Sig Dispense Refill  . amLODipine (NORVASC) 5 MG tablet Take 1 tablet (5 mg total) by mouth daily. 90 tablet 3  . buPROPion (WELLBUTRIN SR) 150 MG 12 hr tablet Take 1  tablet (150 mg total) by mouth 2 (two) times daily. 180 tablet 3  . Cholecalciferol (VITAMIN D-3 PO) Take 1 capsule by mouth daily.    . Cyanocobalamin (VITAMIN B-12 PO) Take by mouth.    . diphenhydramine-acetaminophen (TYLENOL PM) 25-500 MG TABS Take 2 tablets by mouth at bedtime as needed (for sleep).     . docusate sodium (COLACE) 100 MG capsule Take 200 mg by mouth at bedtime.    Marland Kitchen estradiol (ESTRACE) 0.1 MG/GM vaginal cream Place 0.5 g vaginally 2 (two) times a week. Place 0.5g nightly for two weeks then twice a week after 30 g 11  . fish oil-omega-3 fatty acids 1000 MG capsule Take 1 g by mouth daily.    Marland Kitchen gabapentin (NEURONTIN) 100 MG capsule TAKE 2 CAPSULES BY MOUTH AT BEDTIME. 180 capsule 0  . Inositol Niacinate (NO FLUSH NIACIN) 250 MG TABS Take 250 mg by mouth in the morning.    Marland Kitchen MAGNESIUM PO Take 1 tablet by mouth at bedtime.     . minocycline (MINOCIN) 50 MG capsule Take 1 capsule (50 mg total) by mouth 2 (two) times daily. 60 capsule 2  . Misc Natural Products (TART CHERRY ADVANCED PO) Take 1 tablet by mouth at  bedtime.     . SUMAtriptan (IMITREX) 100 MG tablet TAKE 1/2-1 TABLET BY MOUTH AT ONSET MIGRAINE,MAY REPEAT ONCE 2HOURS IF NEED,MAX 2DOSES IN 24 HOURS 9 tablet 3  . Turmeric (QC TUMERIC COMPLEX PO) Take by mouth.     No current facility-administered medications on file prior to visit.        ROS:  All others reviewed and negative.  Objective        PE:  BP 130/82 (BP Location: Right Arm, Patient Position: Sitting, Cuff Size: Normal)   Pulse (!) 59   Temp 98.5 F (36.9 C) (Oral)   Ht 5\' 5"  (1.651 m)   Wt 130 lb (59 kg)   SpO2 100%   BMI 21.63 kg/m                 Constitutional: Pt appears in NAD               HENT: Head: NCAT.                Right Ear: External ear normal.                 Left Ear: External ear normal.                Eyes: . Pupils are equal, round, and reactive to light. Conjunctivae and EOM are normal               Nose: without d/c or  deformity               Neck: Neck supple. Gross normal ROM               Cardiovascular: Normal rate and regular rhythm.                 Pulmonary/Chest: Effort normal and breath sounds without rales or wheezing.                               Neurological: Pt is alert. At baseline orientation, motor grossly intact               Skin: right mid leg with 1.5 cm area red, tender swelling with bite site soft fluctuance,  LE edema - none               Psychiatric: Pt behavior is normal without agitation   Micro: none  Cardiac tracings I have personally interpreted today:  none  Pertinent Radiological findings (summarize): none   Lab Results  Component Value Date   WBC 3.6 (L) 07/02/2020   HGB 14.2 07/02/2020   HCT 42.4 07/02/2020   PLT 287.0 07/02/2020   GLUCOSE 85 07/02/2020   CHOL 196 07/02/2020   TRIG 74.0 07/02/2020   HDL 79.40 07/02/2020   LDLDIRECT 125.5 12/28/2011   LDLCALC 102 (H) 07/02/2020   ALT 21 07/02/2020   AST 20 07/02/2020   NA 141 07/02/2020   K 3.7 07/02/2020   CL 105 07/02/2020   CREATININE 0.85 07/02/2020   BUN 20 07/02/2020   CO2 30 07/02/2020   TSH 2.21 07/02/2020   HGBA1C 5.6 08/15/2017   Assessment/Plan:  Lindsay [name] is a 70 y.o. White or Caucasian [1] female with  has a past medical history of COMMON MIGRAINE (09/20/2006), DEPRESSION (06/11/2009), GERD (06/11/2009), Hypertension (04/17/2018), Neck pain, and Osteopenia (04/17/2018).  Cat bite Mild to mod, for antibx course,  to f/u any worsening  symptoms or concerns  Hypertension BP Readings from Last 3 Encounters:  08/11/20 130/82  08/04/20 122/84  07/07/20 128/78   Stable, pt to continue medical treatment - norvasc   Followup: Return if symptoms worsen or fail to improve.  Cathlean Cower, MD 08/18/2020 10:23 PM [facility]

## 2020-08-20 ENCOUNTER — Ambulatory Visit
Admission: RE | Admit: 2020-08-20 | Discharge: 2020-08-20 | Disposition: A | Discharge status: Home / Self Care | Payer: Medicare HMO | Source: Ambulatory Visit | Attending: Internal Medicine | Admitting: Internal Medicine

## 2020-08-20 ENCOUNTER — Other Ambulatory Visit: Payer: Self-pay

## 2020-08-20 DIAGNOSIS — Z1231 Encounter for screening mammogram for malignant neoplasm of breast: Secondary | ICD-10-CM | POA: Diagnosis not present

## 2020-09-01 ENCOUNTER — Telehealth: Payer: Self-pay | Admitting: Internal Medicine

## 2020-09-01 NOTE — Telephone Encounter (Signed)
LVM for pt to rtn my call to schedule AWV with [facility]. Please schedule AWV if pt calls the office  

## 2020-09-14 NOTE — Progress Notes (Signed)
[facility] [address] [place] Phone: [phone] Subjective:   I [name] am serving as a Education administrator for Dr. Hulan Saas.  This visit occurred during the SARS-CoV-2 public health emergency.  Safety protocols were in place, including screening questions prior to the visit, additional usage of staff PPE, and extensive cleaning of exam room while observing appropriate contact time as indicated for disinfecting solutions.   I'm seeing this patient by the request  of:  Binnie Rail, MD  CC: neck and back pain follow up   [id number]  Lindsay [name] is a 70 y.o. female coming in with complaint of back and neck pain. OMT 08/04/2020. Patient states she is doing ok. Neck is tight today and is effecting her hands.  Medications patient has been prescribed: None          Reviewed prior external information including notes and imaging from previsou exam, outside providers and external EMR if available.   As well as notes that were available from care everywhere and other healthcare systems.  Past medical history, social, surgical and family history all reviewed in electronic medical record.  No pertanent information unless stated regarding to the chief complaint.   Past Medical History:  Diagnosis Date   COMMON MIGRAINE 09/20/2006   DEPRESSION 06/11/2009   GERD 06/11/2009   Hypertension 04/17/2018   Neck pain    eval with sports medicine in 2016/17   Osteopenia 04/17/2018    No Known Allergies   Review of Systems:  No headache, visual changes, nausea, vomiting, diarrhea, constipation, dizziness, abdominal pain, skin rash, fevers, chills, night sweats, weight loss, swollen lymph nodes, body aches, joint swelling, chest pain, shortness of breath, mood changes. POSITIVE muscle aches  Objective  Blood pressure 130/86, pulse (!) 59, height 5\' 5"  (1.651 m), weight 129 lb (58.5 kg), SpO2 97 %.   General: No apparent distress alert and  oriented x3 mood and affect normal, dressed appropriately.  HEENT: Pupils equal, extraocular movements intact  Respiratory: Patient's speak in full sentences and does not appear short of breath  Cardiovascular: No lower extremity edema, non tender, no erythema  Gait normal with good balance and coordination.  MSK:  moderate arthritic changes noted Neck exam does have significant loss of lordosis.  Patient does have tenderness to palpation in the paraspinal musculature of the neck.  Seems to be right greater than left.  Tightness in the right parascapular region also noted.  Mild positive Spurling's still noted.  Patient does have area on the right calf laterally that is where patient did have the puncture.  With some mild pressure patient did have some pus expressed.  No significant erythema noted surrounding the skin itself.  Right wrist exam does have a positive Tinel's noted.  Osteopathic findings  C2 flexed rotated and side bent right C6 flexed rotated and side bent left T4 extended rotated and side bent right inhaled rib T9 extended rotated and side bent left        Assessment and Plan:  Degenerative cervical disc Patient continues to have pain in the neck as well as radicular symptoms.  Patient also has the carpal tunnel on the right side that is still significantly symptomatic.  Discussed with patient about the possibility of either injections again but do feel that a nerve conduction study can help [place] with everything.  This will help [place] decide which injection possibly will be necessary or if any surgical intervention will be  necessary.  Patient will have this done and follow-up with me again in 6 weeks  Cat bite Patient was placed on Augmentin.  Today patient did have some mild callus removed from the initial puncture wound.  Patient states that is done with antibiotics.  No erythema surrounding area but we did discuss potentially if any changes occur we should consider  repeating the Augmentin.  Patient will monitor.   Nonallopathic problems  Decision today to treat with OMT was based on Physical Exam  After verbal consent patient was treated with HVLA, ME, FPR techniques in cervical, rib, thoracic areas  Patient tolerated the procedure well with improvement in symptoms  Patient given exercises, stretches and lifestyle modifications  See medications in patient instructions if given  Patient will follow up in 6-8 weeks      The above documentation has been reviewed and is accurate and complete [name], DO        Note: This dictation was prepared with Dragon dictation along with smaller phrase technology. Any transcriptional errors that result from this process are unintentional.

## 2020-09-15 ENCOUNTER — Encounter: Payer: Self-pay | Admitting: Family Medicine

## 2020-09-15 ENCOUNTER — Ambulatory Visit: Payer: Medicare HMO | Admitting: Family Medicine

## 2020-09-15 ENCOUNTER — Other Ambulatory Visit: Payer: Self-pay

## 2020-09-15 VITALS — BP 130/86 | HR 59 | Ht 65.0 in | Wt 129.0 lb

## 2020-09-15 DIAGNOSIS — M5030 Other cervical disc degeneration, unspecified cervical region: Secondary | ICD-10-CM

## 2020-09-15 DIAGNOSIS — M9908 Segmental and somatic dysfunction of rib cage: Secondary | ICD-10-CM

## 2020-09-15 DIAGNOSIS — W5501XD Bitten by cat, subsequent encounter: Secondary | ICD-10-CM

## 2020-09-15 DIAGNOSIS — M79641 Pain in right hand: Secondary | ICD-10-CM

## 2020-09-15 DIAGNOSIS — M9902 Segmental and somatic dysfunction of thoracic region: Secondary | ICD-10-CM

## 2020-09-15 DIAGNOSIS — M79642 Pain in left hand: Secondary | ICD-10-CM | POA: Diagnosis not present

## 2020-09-15 DIAGNOSIS — M9901 Segmental and somatic dysfunction of cervical region: Secondary | ICD-10-CM

## 2020-09-15 NOTE — Assessment & Plan Note (Signed)
Patient was placed on Augmentin.  Today patient did have some mild callus removed from the initial puncture wound.  Patient states that is done with antibiotics.  No erythema surrounding area but we did discuss potentially if any changes occur we should consider repeating the Augmentin.  Patient will monitor.

## 2020-09-15 NOTE — Patient Instructions (Signed)
Good to see you Bilateral upper extremity nerve conduction test See me again in 6-8 weeks

## 2020-09-15 NOTE — Assessment & Plan Note (Signed)
Patient continues to have pain in the neck as well as radicular symptoms.  Patient also has the carpal tunnel on the right side that is still significantly symptomatic.  Discussed with patient about the possibility of either injections again but do feel that a nerve conduction study can help [place] with everything.  This will help [place] decide which injection possibly will be necessary or if any surgical intervention will be necessary.  Patient will have this done and follow-up with me again in 6 weeks

## 2020-09-16 MED ORDER — AMOXICILLIN-POT CLAVULANATE 875-125 MG PO TABS: 1.0000 | ORAL_TABLET | Freq: Two times a day (BID) | ORAL | 0 refills | Status: AC

## 2020-10-29 NOTE — Progress Notes (Signed)
[place] Phone: [phone] Subjective:   [name], am serving as a scribe for Dr. Hulan Saas.  This visit occurred during the SARS-CoV-2 public health emergency.  Safety protocols were in place, including screening questions prior to the visit, additional usage of staff PPE, and extensive cleaning of exam room while observing appropriate contact time as indicated for disinfecting solutions.   I'm seeing this patient by the request  of:  Binnie Rail, MD  CC: Neck pain follow-up  [id number]  Lindsay [name] is a 70 y.o. female coming in with complaint of back and neck pain. OMT 09/15/2020. Was placed on Augementin after last visit due to cat bite. Patient states overall doing relatively well but does have the chronic neck pain.  Does take the gabapentin.  Patient describes the pain as a dull, throbbing aching pain.  Patient does have some radicular symptoms.  Has not gotten the nerve conduction study yet.  Medications patient has been prescribed:   Taking:         Reviewed prior external information including notes and imaging from previsou exam, outside providers and external EMR if available.   As well as notes that were available from care everywhere and other healthcare systems.  Past medical history, social, surgical and family history all reviewed in electronic medical record.  No pertanent information unless stated regarding to the chief complaint.   Past Medical History:  Diagnosis Date   COMMON MIGRAINE 09/20/2006   DEPRESSION 06/11/2009   GERD 06/11/2009   Hypertension 04/17/2018   Neck pain    eval with sports medicine in 2016/17   Osteopenia 04/17/2018    No Known Allergies   Review of Systems:  No headache, visual changes, nausea, vomiting, diarrhea, constipation, dizziness, abdominal pain, skin rash, fevers, chills, night sweats, weight loss, swollen lymph nodes, body aches, j  chest pain, shortness of breath, mood changes. POSITIVE muscle aches, joint swelling  Objective  Blood pressure 120/78, pulse 69, height '5\' 5"'$  (1.651 m), weight 128 lb (58.1 kg), SpO2 96 %.   General: No apparent distress alert and oriented x3 mood and affect normal, dressed appropriately.  HEENT: Pupils equal, extraocular movements intact  Respiratory: Patient's speak in full sentences and does not appear short of breath  Cardiovascular: No lower extremity edema, non tender, no erythema  Recent bee sting on the left knee.  Mild erythema surrounding the sting.  No signs of any type of cellulitis.  Tender to palpation. Neck exam does have some loss of lordosis.  Some tenderness to palpation in the paraspinal musculature of the cervical spine as well as on the right with scapular area.  Tender points noted.  Osteopathic findings  C2 flexed rotated and side bent right C6 flexed rotated and side bent left T3 extended rotated and side bent right inhaled rib       Assessment and Plan:  Degenerative cervical disc Degenerative disc disease.  Acute on chronic problem.  Patient has been doing relatively well overall.  He does have the gabapentin.  Still difficult to say if this or the possibility of carpal tunnel causing more of the discomfort in the arm.  Patient is going to schedule a nerve conduction study at some point.  Follow-up with me again in 6 weeks otherwise.   Nonallopathic problems  Decision today to treat with OMT was based on Physical Exam  After verbal consent patient was treated with HVLA,  ME, FPR techniques in cervical, rib, thoracic  areas  Patient tolerated the procedure well with improvement in symptoms  Patient given exercises, stretches and lifestyle modifications  See medications in patient instructions if given  Patient will follow up in 6-8 weeks      The above documentation has been reviewed and is accurate and complete [name], DO       Note:  This dictation was prepared with Dragon dictation along with smaller phrase technology. Any transcriptional errors that result from this process are unintentional.

## 2020-11-03 ENCOUNTER — Ambulatory Visit: Payer: Medicare HMO | Admitting: Family Medicine

## 2020-11-03 ENCOUNTER — Other Ambulatory Visit: Payer: Self-pay

## 2020-11-03 ENCOUNTER — Encounter: Payer: Self-pay | Admitting: Family Medicine

## 2020-11-03 VITALS — BP 120/78 | HR 69 | Ht 65.0 in | Wt 128.0 lb

## 2020-11-03 DIAGNOSIS — M9902 Segmental and somatic dysfunction of thoracic region: Secondary | ICD-10-CM

## 2020-11-03 DIAGNOSIS — M9908 Segmental and somatic dysfunction of rib cage: Secondary | ICD-10-CM | POA: Diagnosis not present

## 2020-11-03 DIAGNOSIS — M5030 Other cervical disc degeneration, unspecified cervical region: Secondary | ICD-10-CM | POA: Diagnosis not present

## 2020-11-03 DIAGNOSIS — M9901 Segmental and somatic dysfunction of cervical region: Secondary | ICD-10-CM | POA: Diagnosis not present

## 2020-11-03 NOTE — Patient Instructions (Signed)
Good to see you  Tell Nori Riis to kiss your feet to make the better Otherwise continue the exercises See me in 6-8 weeks

## 2020-11-03 NOTE — Assessment & Plan Note (Signed)
Degenerative disc disease.  Acute on chronic problem.  Patient has been doing relatively well overall.  He does have the gabapentin.  Still difficult to say if this or the possibility of carpal tunnel causing more of the discomfort in the arm.  Patient is going to schedule a nerve conduction study at some point.  Follow-up with me again in 6 weeks otherwise.

## 2020-11-06 ENCOUNTER — Encounter: Payer: Self-pay | Admitting: Family Medicine

## 2020-12-14 NOTE — Progress Notes (Signed)
  [place] Phone: [phone] Subjective:   Lindsay Lindsay [name], am serving as a scribe for Dr. Hulan Saas. This visit occurred during the SARS-CoV-2 public health emergency.  Safety protocols were in place, including screening questions prior to the visit, additional usage of staff PPE, and extensive cleaning of exam room while observing appropriate contact time as indicated for disinfecting solutions.   I'm seeing this patient by the request  of:  Binnie Rail, MD  CC: Neck pain, back pain  [id number]  Lindsay Lindsay [name] is a 70 y.o. female coming in with complaint of back and neck pain. OMT 11/03/2020. Patient states that she is having pain in scapular area and neck.   Medications patient has been prescribed: None         Past Medical History:  Diagnosis Date   COMMON MIGRAINE 09/20/2006   DEPRESSION 06/11/2009   GERD 06/11/2009   Hypertension 04/17/2018   Neck pain    eval with sports medicine in 2016/17   Osteopenia 04/17/2018    Lindsay [name] Known Allergies   Review of Systems:  Lindsay [name] headache, visual changes, nausea, vomiting, diarrhea, constipation, dizziness, abdominal pain, skin rash, fevers, chills, night sweats, weight loss, swollen lymph nodes, body aches, joint swelling, chest pain, shortness of breath, mood changes. POSITIVE muscle aches  Objective  Blood pressure (!) 138/92, pulse 64, height '5\' 5"'$  (1.651 m), weight 128 lb (58.1 kg), SpO2 98 %.   General: Lindsay [name] apparent distress alert and oriented x3 mood and affect normal, dressed appropriately.  HEENT: Pupils equal, extraocular movements intact  Respiratory: Patient's speak in full sentences and does not appear short of breath  Cardiovascular: Lindsay [name] lower extremity edema, non tender, Lindsay [name] erythema  Patient does have loss of lordosis.  Degenerative scoliosis noted minorly of the upper back.  Patient does have tightness in the parascapular region right greater than  left.  Negative Spurling's of the neck at the moment.  5 out of 5 strength noted of the hands but patient still does have some mild positive Tinel's of the right wrist  Osteopathic findings  C2 flexed rotated and side bent left C6 flexed rotated and side bent left T3 extended rotated and side bent right inhaled rib T9 extended rotated and side bent left L2 flexed rotated and side bent right Sacrum right on right       Assessment and Plan:  Degenerative cervical disc Degenerative disc disease.  Discussed icing regimen and home exercises, discussed which activities to doing which wants to avoid.  Increase activity slowly.  Chronic problem with exacerbation.  Does have the gabapentin the patient takes intermittently.  Follow-up with me again in 6 weeks   Nonallopathic problems  Decision today to treat with OMT was based on Physical Exam  After verbal consent patient was treated with HVLA, ME, FPR techniques in cervical, rib, thoracic, lumbar, and sacral  areas  Patient tolerated the procedure well with improvement in symptoms  Patient given exercises, stretches and lifestyle modifications  See medications in patient instructions if given  Patient will follow up in 4-8 weeks      The above documentation has been reviewed and is accurate and complete Lindsay Pulley, DO        Note: This dictation was prepared with Dragon dictation along with smaller phrase technology. Any transcriptional errors that result from this process are unintentional.

## 2020-12-15 ENCOUNTER — Ambulatory Visit: Payer: Medicare HMO | Admitting: Family Medicine

## 2020-12-15 ENCOUNTER — Other Ambulatory Visit: Payer: Self-pay

## 2020-12-15 ENCOUNTER — Encounter: Payer: Self-pay | Admitting: Family Medicine

## 2020-12-15 VITALS — BP 138/92 | HR 64 | Ht 65.0 in | Wt 128.0 lb

## 2020-12-15 DIAGNOSIS — M9903 Segmental and somatic dysfunction of lumbar region: Secondary | ICD-10-CM

## 2020-12-15 DIAGNOSIS — M9904 Segmental and somatic dysfunction of sacral region: Secondary | ICD-10-CM | POA: Diagnosis not present

## 2020-12-15 DIAGNOSIS — M9908 Segmental and somatic dysfunction of rib cage: Secondary | ICD-10-CM | POA: Diagnosis not present

## 2020-12-15 DIAGNOSIS — M9902 Segmental and somatic dysfunction of thoracic region: Secondary | ICD-10-CM | POA: Diagnosis not present

## 2020-12-15 DIAGNOSIS — M9901 Segmental and somatic dysfunction of cervical region: Secondary | ICD-10-CM | POA: Diagnosis not present

## 2020-12-15 DIAGNOSIS — M5030 Other cervical disc degeneration, unspecified cervical region: Secondary | ICD-10-CM | POA: Diagnosis not present

## 2020-12-15 NOTE — Assessment & Plan Note (Signed)
Degenerative disc disease.  Discussed icing regimen and home exercises, discussed which activities to doing which wants to avoid.  Increase activity slowly.  Chronic problem with exacerbation.  Does have the gabapentin the patient takes intermittently.  Follow-up with me again in 6 weeks

## 2020-12-15 NOTE — Patient Instructions (Signed)
Great to see you Thanks for showing pictures Tell Milta Deiters to stop drooling See me again in 6 weeks

## 2021-01-05 ENCOUNTER — Encounter: Payer: Self-pay | Admitting: Neurology

## 2021-01-05 ENCOUNTER — Other Ambulatory Visit: Payer: Self-pay

## 2021-01-05 ENCOUNTER — Encounter: Payer: Self-pay | Admitting: Family Medicine

## 2021-01-05 DIAGNOSIS — M5030 Other cervical disc degeneration, unspecified cervical region: Secondary | ICD-10-CM

## 2021-01-26 ENCOUNTER — Encounter: Payer: Self-pay | Admitting: Internal Medicine

## 2021-01-26 ENCOUNTER — Ambulatory Visit: Payer: Medicare HMO | Admitting: Family Medicine

## 2021-02-09 ENCOUNTER — Other Ambulatory Visit: Payer: Self-pay

## 2021-02-09 ENCOUNTER — Ambulatory Visit: Payer: Medicare HMO | Admitting: Neurology

## 2021-02-09 DIAGNOSIS — R202 Paresthesia of skin: Secondary | ICD-10-CM

## 2021-02-09 DIAGNOSIS — G5603 Carpal tunnel syndrome, bilateral upper limbs: Secondary | ICD-10-CM

## 2021-02-09 NOTE — Procedures (Signed)
[facility]  [place]  [place], [place] [address] Tel: [phone] Fax:  [phone] Test Date:  02/09/2021  Patient: Lindsay [name] DOB: [birth year] Physician: Narda Amber, DO  Sex: Female Height: 5\' 5"  Ref Phys: Hulan Saas, DO  ID#: [id number]   Technician:    Patient Complaints: This is a 70 year old female referred for evaluation of bilateral hand paresthesias.  NCV & EMG Findings: Extensive electrodiagnostic testing of the right upper extremity and additional studies of the left shows:  Right median sensory response shows prolonged latency (4.7 ms) and reduced amplitude (9.2 V).  Left median sensory response shows prolonged latency (4.1 ms).  Bilateral ulnar sensory responses are within normal limits. Bilateral median motor responses show prolonged latency (R5.1, L4.3 ms).  Bilateral ulnar motor responses are within normal limits. There is no evidence of active or chronic motor axonal loss changes affecting any of the tested muscles.  Motor unit configuration and recruitment pattern is within normal limits.  Impression: Bilateral median neuropathy at or distal to the wrist, consistent with a clinical diagnosis of carpal tunnel syndrome.  Overall, these findings are moderate in degree electrically, and worse on the right.   ___________________________ Narda Amber, DO    Nerve Conduction Studies Anti Sensory Summary Table   Stim Site NR Peak (ms) Norm Peak (ms) P-T Amp (V) Norm P-T Amp  Left Median Anti Sensory (2nd Digit)  32C  Wrist    4.1 <3.8 13.4 >10  Right Median Anti Sensory (2nd Digit)  32C  Wrist    4.7 <3.8 9.2 >10  Left Ulnar Anti Sensory (5th Digit)  32C  Wrist    2.8 <3.2 25.4 >5  Right Ulnar Anti Sensory (5th Digit)  32C  Wrist    2.8 <3.2 32.5 >5   Motor Summary Table   Stim Site NR Onset (ms) Norm Onset (ms) O-P Amp (mV) Norm O-P Amp Site1 Site2 Delta-0 (ms) Dist (cm) Vel (m/s) Norm Vel (m/s)  Left Median Motor (Abd  Poll Brev)  32C  Wrist    4.3 <4.0 6.6 >5 Elbow Wrist 5.1 28.0 55 >50  Elbow    9.4  6.1         Right Median Motor (Abd Poll Brev)  32C  Wrist    5.1 <4.0 8.1 >5 Elbow Wrist 4.9 27.0 55 >50  Elbow    10.0  7.8         Left Ulnar Motor (Abd Dig Minimi)  32C  Wrist    2.3 <3.1 9.8 >7 B Elbow Wrist 3.2 21.0 66 >50  B Elbow    5.5  9.0  A Elbow B Elbow 1.8 10.0 56 >50  A Elbow    7.3  8.7         Right Ulnar Motor (Abd Dig Minimi)  32C  Wrist    2.2 <3.1 11.9 >7 B Elbow Wrist 3.5 22.0 63 >50  B Elbow    5.7  11.0  A Elbow B Elbow 1.6 10.0 63 >50  A Elbow    7.3  10.9          EMG   Side Muscle Ins Act Fibs Psw Fasc Number Recrt Dur Dur. Amp Amp. Poly Poly. Comment  Right 1stDorInt Nml Nml Nml Nml Nml Nml Nml Nml Nml Nml Nml Nml N/A  Right Abd Poll Brev Nml Nml Nml Nml Nml Nml Nml Nml Nml Nml Nml Nml N/A  Right PronatorTeres Nml Nml Nml Nml Nml  Nml Nml Nml Nml Nml Nml Nml N/A  Right Biceps Nml Nml Nml Nml Nml Nml Nml Nml Nml Nml Nml Nml N/A  Right Triceps Nml Nml Nml Nml Nml Nml Nml Nml Nml Nml Nml Nml N/A  Right Deltoid Nml Nml Nml Nml Nml Nml Nml Nml Nml Nml Nml Nml N/A  Left 1stDorInt Nml Nml Nml Nml Nml Nml Nml Nml Nml Nml Nml Nml N/A  Left Abd Poll Brev Nml Nml Nml Nml Nml Nml Nml Nml Nml Nml Nml Nml N/A  Left PronatorTeres Nml Nml Nml Nml Nml Nml Nml Nml Nml Nml Nml Nml N/A  Left Biceps Nml Nml Nml Nml Nml Nml Nml Nml Nml Nml Nml Nml N/A  Left Triceps Nml Nml Nml Nml Nml Nml Nml Nml Nml Nml Nml Nml N/A  Left Deltoid Nml Nml Nml Nml Nml Nml Nml Nml Nml Nml Nml Nml N/A      Waveforms:

## 2021-03-03 NOTE — Progress Notes (Signed)
[place] Phone: [phone] Subjective:   Lindsay Lindsay [name], am serving as a scribe for Dr. Hulan Saas.  This visit occurred during the SARS-CoV-2 public health emergency.  Safety protocols were in place, including screening questions prior to the visit, additional usage of staff PPE, and extensive cleaning of exam room while observing appropriate contact time as indicated for disinfecting solutions.    I'm seeing this patient by the request  of:  Binnie Rail, MD  CC: Neck and back pain follow-up  [id number]  Lindsay Lindsay [name] is a 70 y.o. female coming in with complaint of back and neck pain. OMT on 12/15/2020. Patient states that she is tight in upper back.  Found to have carpal tunnel.  Had EMG on 02/09/2021. Pain at night in both hand R>L.  Moderate to severe carpal tunnel on the right side and mild to moderate on the left side  Medications patient has been prescribed: None  Taking:         Reviewed prior external information including notes and imaging from previsou exam, outside providers and external EMR if available.   As well as notes that were available from care everywhere and other healthcare systems.  Past medical history, social, surgical and family history all reviewed in electronic medical record.  Lindsay [name] pertanent information unless stated regarding to the chief complaint.   Past Medical History:  Diagnosis Date   COMMON MIGRAINE 09/20/2006   DEPRESSION 06/11/2009   GERD 06/11/2009   Hypertension 04/17/2018   Neck pain    eval with sports medicine in 2016/17   Osteopenia 04/17/2018    Lindsay [name] Known Allergies   Review of Systems:  Lindsay [name] headache, visual changes, nausea, vomiting, diarrhea, constipation, dizziness, abdominal pain, skin rash, fevers, chills, night sweats, weight loss, swollen lymph nodes, body aches, joint swelling, chest pain, shortness of breath, mood changes. POSITIVE muscle  aches  Objective  Blood pressure 138/90, pulse 80, height 5\' 5"  (1.651 m), weight 132 lb (59.9 kg), SpO2 97 %.   General: Lindsay [name] apparent distress alert and oriented x3 mood and affect normal, dressed appropriately.  HEENT: Pupils equal, extraocular movements intact  Respiratory: Patient's speak in full sentences and does not appear short of breath  Cardiovascular: Lindsay [name] lower extremity edema, non tender, Lindsay [name] erythema  Neck exam does have some loss of lordosis.  Tightness noted with sidebending as well as extension.  Mild crepitus noted.  Patient does have positive Tinel's though hands.  Osteopathic findings  C2 flexed rotated and side bent right C6 flexed rotated and side bent left T3 extended rotated and side bent right inhaled rib T6 extended rotated and side bent left L2 flexed rotated and side bent right Sacrum right on right       Assessment and Plan:  Right carpal tunnel syndrome EMG does state that this is more of the carpal tunnel and has some worsening progression.  Patient still wants to avoid any surgical intervention.  Wants to hold on any injection.  We will continue to monitor.  If any weakness patient understands that more aggressive therapy will be necessary.  Follow-up with me again in 6 weeks  Degenerative cervical disc Chronic, with mild exacerbation.  Has been sometime since we have seen patient.  Patient does have the gabapentin for breakthrough pain but does not use it on a regular basis.  Does respond well to the manipulation.  Discussed icing regimen and home exercises.  Follow-up again 6 weeks   Nonallopathic problems  Decision today to treat with OMT was based on Physical Exam  After verbal consent patient was treated with HVLA, ME, FPR techniques in cervical, rib, thoracic, lumbar, and sacral  areas  Patient tolerated the procedure well with improvement in symptoms  Patient given exercises, stretches and lifestyle modifications  See medications in patient  instructions if given  Patient will follow up in 4-8 weeks      The above documentation has been reviewed and is accurate and complete Lindsay Pulley, DO        Note: This dictation was prepared with Dragon dictation along with smaller phrase technology. Any transcriptional errors that result from this process are unintentional.

## 2021-03-04 ENCOUNTER — Ambulatory Visit: Payer: Medicare HMO | Admitting: Family Medicine

## 2021-03-04 ENCOUNTER — Other Ambulatory Visit: Payer: Self-pay

## 2021-03-04 VITALS — BP 138/90 | HR 80 | Ht 65.0 in | Wt 132.0 lb

## 2021-03-04 DIAGNOSIS — G5601 Carpal tunnel syndrome, right upper limb: Secondary | ICD-10-CM | POA: Diagnosis not present

## 2021-03-04 DIAGNOSIS — M9903 Segmental and somatic dysfunction of lumbar region: Secondary | ICD-10-CM | POA: Diagnosis not present

## 2021-03-04 DIAGNOSIS — M9901 Segmental and somatic dysfunction of cervical region: Secondary | ICD-10-CM

## 2021-03-04 DIAGNOSIS — M5030 Other cervical disc degeneration, unspecified cervical region: Secondary | ICD-10-CM

## 2021-03-04 DIAGNOSIS — M9902 Segmental and somatic dysfunction of thoracic region: Secondary | ICD-10-CM | POA: Diagnosis not present

## 2021-03-04 DIAGNOSIS — M9908 Segmental and somatic dysfunction of rib cage: Secondary | ICD-10-CM

## 2021-03-04 DIAGNOSIS — M9904 Segmental and somatic dysfunction of sacral region: Secondary | ICD-10-CM

## 2021-03-04 NOTE — Patient Instructions (Signed)
Let Lindsay [name] hold your sippy cup and do house work Will keep watching wrist Let me know when you want injection See me in 5-6 weeks

## 2021-03-05 NOTE — Assessment & Plan Note (Signed)
Chronic, with mild exacerbation.  Has been sometime since we have seen patient.  Patient does have the gabapentin for breakthrough pain but does not use it on a regular basis.  Does respond well to the manipulation.  Discussed icing regimen and home exercises.  Follow-up again 6 weeks

## 2021-03-05 NOTE — Assessment & Plan Note (Signed)
EMG does state that this is more of the carpal tunnel and has some worsening progression.  Patient still wants to avoid any surgical intervention.  Wants to hold on any injection.  We will continue to monitor.  If any weakness patient understands that more aggressive therapy will be necessary.  Follow-up with me again in 6 weeks

## 2021-03-08 ENCOUNTER — Other Ambulatory Visit: Payer: Self-pay | Admitting: Internal Medicine

## 2021-03-08 MED ORDER — HYDROCORTISONE (PERIANAL) 2.5 % EX CREA: 1.0000 "application " | TOPICAL_CREAM | Freq: Two times a day (BID) | CUTANEOUS | 0 refills | Status: DC

## 2021-03-15 ENCOUNTER — Encounter: Payer: Self-pay | Admitting: Internal Medicine

## 2021-03-24 ENCOUNTER — Other Ambulatory Visit: Payer: Self-pay | Admitting: Internal Medicine

## 2021-03-24 DIAGNOSIS — Z1231 Encounter for screening mammogram for malignant neoplasm of breast: Secondary | ICD-10-CM

## 2021-04-07 ENCOUNTER — Ambulatory Visit: Payer: Medicare HMO | Admitting: Family Medicine

## 2021-04-07 NOTE — Progress Notes (Signed)
[place] Phone: [phone] Subjective:   Lindsay Lindsay [name], am serving as a scribe for Dr. Hulan Saas. This visit occurred during the SARS-CoV-2 public health emergency.  Safety protocols were in place, including screening questions prior to the visit, additional usage of staff PPE, and extensive cleaning of exam room while observing appropriate contact time as indicated for disinfecting solutions.  I'm seeing this patient by the request  of:  Binnie Rail, MD  CC: Back pain, neck pain follow-up  [id number]  Lindsay Lindsay [name] is a 71 y.o. female coming in with complaint of back and neck pain. OMT 03/04/2021. Patient states that she has had good and bad days. She tried to ignore her back pain. Doing ok today after getting a large trunk out of attic.  Patient states that her wrist is getting worse.  Wants to talk about the possibility of surgical intervention for the carpal tunnel.  Medications patient has been prescribed: None  Taking:         Reviewed prior external information including notes and imaging from previsou exam, outside providers and external EMR if available.   As well as notes that were available from care everywhere and other healthcare systems.  Past medical history, social, surgical and family history all reviewed in electronic medical record.  Lindsay [name] pertanent information unless stated regarding to the chief complaint.   Past Medical History:  Diagnosis Date   COMMON MIGRAINE 09/20/2006   DEPRESSION 06/11/2009   GERD 06/11/2009   Hypertension 04/17/2018   Neck pain    eval with sports medicine in 2016/17   Osteopenia 04/17/2018    Lindsay [name] Known Allergies   Review of Systems:  Lindsay [name] headache, visual changes, nausea, vomiting, diarrhea, constipation, dizziness, abdominal pain, skin rash, fevers, chills, night sweats, weight loss, swollen lymph nodes, body aches, joint swelling, chest pain, shortness  of breath, mood changes. POSITIVE muscle aches  Objective  Blood pressure 122/88, pulse 67, height 5\' 5"  (1.651 m), weight 130 lb (59 kg), SpO2 98 %.   General: Lindsay [name] apparent distress alert and oriented x3 mood and affect normal, dressed appropriately.  HEENT: Pupils equal, extraocular movements intact  Respiratory: Patient's speak in full sentences and does not appear short of breath  Cardiovascular: Lindsay [name] lower extremity edema, non tender, Lindsay [name] erythema  Neuro: Cranial nerves II through XII are intact, neurovascularly intact in all extremities with 2+ DTRs and 2+ pulses.  Gait normal with good balance and coordination.  MSK:  Non tender with full range of motion and good stability and symmetric strength and tone of shoulders, elbows, wrist, hip, knee and ankles bilaterally.  Back - Normal skin, Spine with normal alignment and Lindsay [name] deformity.  Lindsay [name] tenderness to vertebral process palpation.  Paraspinous muscles are not tender and without spasm.   Range of motion is full at neck and lumbar sacral regions  Osteopathic findings  C2 flexed rotated and side bent right C3 flexed rotated and side bent left T5 extended rotated and side bent right inhaled rib T6 extended rotated and side bent left        Assessment and Plan:  Right carpal tunnel syndrome Referred patient to discuss surgical intervention after the EMG has confirmed the nerve conduction test  Degenerative cervical disc Chronic problem.  Responding well though to osteopathic manipulation.  There is gabapentin.  Discussed which activities to doing which wants to avoid.  Increase activity slowly.  Follow-up again in 6  to 8 weeks.   Nonallopathic problems  Decision today to treat with OMT was based on Physical Exam  After verbal consent patient was treated with HVLA, ME, FPR techniques in cervical, rib, thoracic  areas  Patient tolerated the procedure well with improvement in symptoms  Patient given exercises, stretches and  lifestyle modifications  See medications in patient instructions if given  Patient will follow up in 4-8 weeks      The above documentation has been reviewed and is accurate and complete Lindsay Pulley, DO        Note: This dictation was prepared with Dragon dictation along with smaller phrase technology. Any transcriptional errors that result from this process are unintentional.

## 2021-04-08 ENCOUNTER — Other Ambulatory Visit: Payer: Self-pay

## 2021-04-08 ENCOUNTER — Ambulatory Visit: Payer: Medicare HMO | Admitting: Family Medicine

## 2021-04-08 ENCOUNTER — Encounter: Payer: Self-pay | Admitting: Family Medicine

## 2021-04-08 VITALS — BP 122/88 | HR 67 | Ht 65.0 in | Wt 130.0 lb

## 2021-04-08 DIAGNOSIS — G5601 Carpal tunnel syndrome, right upper limb: Secondary | ICD-10-CM

## 2021-04-08 DIAGNOSIS — M9908 Segmental and somatic dysfunction of rib cage: Secondary | ICD-10-CM | POA: Diagnosis not present

## 2021-04-08 DIAGNOSIS — M5030 Other cervical disc degeneration, unspecified cervical region: Secondary | ICD-10-CM | POA: Diagnosis not present

## 2021-04-08 DIAGNOSIS — M9902 Segmental and somatic dysfunction of thoracic region: Secondary | ICD-10-CM

## 2021-04-08 DIAGNOSIS — M9901 Segmental and somatic dysfunction of cervical region: Secondary | ICD-10-CM

## 2021-04-08 NOTE — Assessment & Plan Note (Signed)
Referred patient to discuss surgical intervention after the EMG has confirmed the nerve conduction test

## 2021-04-08 NOTE — Assessment & Plan Note (Signed)
Chronic problem.  Responding well though to osteopathic manipulation.  There is gabapentin.  Discussed which activities to doing which wants to avoid.  Increase activity slowly.  Follow-up again in 6 to 8 weeks.

## 2021-04-08 NOTE — Patient Instructions (Signed)
Referral to Dr. Grandville Silos  See me in 6-8 weeks

## 2021-04-15 DIAGNOSIS — G5602 Carpal tunnel syndrome, left upper limb: Secondary | ICD-10-CM | POA: Diagnosis not present

## 2021-04-15 DIAGNOSIS — G5601 Carpal tunnel syndrome, right upper limb: Secondary | ICD-10-CM | POA: Diagnosis not present

## 2021-05-14 DIAGNOSIS — G5601 Carpal tunnel syndrome, right upper limb: Secondary | ICD-10-CM | POA: Diagnosis not present

## 2021-05-26 NOTE — Progress Notes (Signed)
[place] Phone: [phone] Subjective:   [name], am serving as a scribe for Dr. Hulan Saas.  This visit occurred during the SARS-CoV-2 public health emergency.  Safety protocols were in place, including screening questions prior to the visit, additional usage of staff PPE, and extensive cleaning of exam room while observing appropriate contact time as indicated for disinfecting solutions.   I'm seeing this patient by the request  of:  Binnie Rail, MD  CC: Back and neck pain follow-up  [id number]  AIDAH FORQUER is a 71 y.o. female coming in with complaint of back and neck pain. OMT 04/08/2021. Also f/u for R carpal tunnel. Had surgery on wrist 2 weeks ago with immediate relief. Patient states her back is tight as she has not been as active.   Medications patient has been prescribed:   Taking:         Reviewed prior external information including notes and imaging from previsou exam, outside providers and external EMR if available.   As well as notes that were available from care everywhere and other healthcare systems.  Past medical history, social, surgical and family history all reviewed in electronic medical record.  No pertanent information unless stated regarding to the chief complaint.   Past Medical History:  Diagnosis Date   COMMON MIGRAINE 09/20/2006   DEPRESSION 06/11/2009   GERD 06/11/2009   Hypertension 04/17/2018   Neck pain    eval with sports medicine in 2016/17   Osteopenia 04/17/2018    No Known Allergies   Review of Systems:  No headache, visual changes, nausea, vomiting, diarrhea, constipation, dizziness, abdominal pain, skin rash, fevers, chills, night sweats, weight loss, swollen lymph nodes, body aches, joint swelling, chest pain, shortness of breath, mood changes. POSITIVE muscle aches  Objective  Blood pressure 132/80, height 5\' 5"  (1.651 m), weight 130 lb (59  kg).   General: No apparent distress alert and oriented x3 mood and affect normal, dressed appropriately.  HEENT: Pupils equal, extraocular movements intact  Respiratory: Patient's speak in full sentences and does not appear short of breath  Cardiovascular: No lower extremity edema, non tender, no erythema  Tightness in the neck noted.  Limited sidebending bilaterally Patient does not have any Spurling's maneuver.  Patient has good grip strength.  Does have recent incision from patient's surgery for carpal tunnel  Osteopathic findings  C2 flexed rotated and side bent right C7 flexed rotated and side bent right T3 extended rotated and side bent right inhaled rib T9 extended rotated and side bent left L2 flexed rotated and side bent right Sacrum right on right       Assessment and Plan:  Right carpal tunnel syndrome Improved after surgery  discussed monitoring but should do well   Degenerative cervical disc Chronic problem with some tightness noted.  Discussed icing regimen and home exercises, which activities to do and which ones to avoid Increase activity slowly.  Continue on posture and ergonomics has gabapentin for breakthrough if necessary.  Follow-up again in 4 to 8 weeks   Nonallopathic problems  Decision today to treat with OMT was based on Physical Exam  After verbal consent patient was treated with HVLA, ME, FPR techniques in cervical, rib, thoracic,  areas  Patient tolerated the procedure well with improvement in symptoms  Patient given exercises, stretches and lifestyle modifications  See medications in patient instructions if given  Patient will follow up in 4-8  weeks      The above documentation has been reviewed and is accurate and complete [name], DO        Note: This dictation was prepared with Dragon dictation along with smaller phrase technology. Any transcriptional errors that result from this process are unintentional.

## 2021-05-27 ENCOUNTER — Other Ambulatory Visit: Payer: Self-pay

## 2021-05-27 ENCOUNTER — Ambulatory Visit: Payer: Self-pay

## 2021-05-27 ENCOUNTER — Ambulatory Visit: Payer: Medicare HMO | Admitting: Family Medicine

## 2021-05-27 VITALS — BP 132/80 | Ht 65.0 in | Wt 130.0 lb

## 2021-05-27 DIAGNOSIS — M9901 Segmental and somatic dysfunction of cervical region: Secondary | ICD-10-CM

## 2021-05-27 DIAGNOSIS — M9902 Segmental and somatic dysfunction of thoracic region: Secondary | ICD-10-CM | POA: Diagnosis not present

## 2021-05-27 DIAGNOSIS — M9903 Segmental and somatic dysfunction of lumbar region: Secondary | ICD-10-CM | POA: Diagnosis not present

## 2021-05-27 DIAGNOSIS — M5030 Other cervical disc degeneration, unspecified cervical region: Secondary | ICD-10-CM

## 2021-05-27 DIAGNOSIS — M9904 Segmental and somatic dysfunction of sacral region: Secondary | ICD-10-CM | POA: Diagnosis not present

## 2021-05-27 DIAGNOSIS — M9908 Segmental and somatic dysfunction of rib cage: Secondary | ICD-10-CM | POA: Diagnosis not present

## 2021-05-27 DIAGNOSIS — G5601 Carpal tunnel syndrome, right upper limb: Secondary | ICD-10-CM

## 2021-05-27 NOTE — Patient Instructions (Signed)
Milta Deiters to do housework for one more month See me 6 weeks

## 2021-05-27 NOTE — Assessment & Plan Note (Signed)
Improved after surgery  discussed monitoring but should do well

## 2021-05-27 NOTE — Assessment & Plan Note (Signed)
Chronic problem with some tightness noted.  Discussed icing regimen and home exercises, which activities to do and which ones to avoid Increase activity slowly.  Continue on posture and ergonomics has gabapentin for breakthrough if necessary.  Follow-up again in 4 to 8 weeks

## 2021-06-24 ENCOUNTER — Encounter: Payer: Self-pay | Admitting: Physician Assistant

## 2021-06-24 ENCOUNTER — Other Ambulatory Visit: Payer: Self-pay

## 2021-06-24 ENCOUNTER — Ambulatory Visit: Payer: Medicare HMO | Admitting: Physician Assistant

## 2021-06-24 DIAGNOSIS — L820 Inflamed seborrheic keratosis: Secondary | ICD-10-CM

## 2021-06-24 DIAGNOSIS — D1722 Benign lipomatous neoplasm of skin and subcutaneous tissue of left arm: Secondary | ICD-10-CM | POA: Diagnosis not present

## 2021-06-24 DIAGNOSIS — Z1283 Encounter for screening for malignant neoplasm of skin: Secondary | ICD-10-CM | POA: Diagnosis not present

## 2021-06-29 ENCOUNTER — Encounter: Payer: Self-pay | Admitting: Physician Assistant

## 2021-07-07 NOTE — Progress Notes (Signed)
?Lindsay [name] D.O. ?[facility] ?[place] ?Phone: [phone] ?Subjective:   ?I, Lindsay [name], am serving as a Education administrator for Dr. Hulan Saas. ?This visit occurred during the SARS-CoV-2 public health emergency.  Safety protocols were in place, including screening questions prior to the visit, additional usage of staff PPE, and extensive cleaning of exam room while observing appropriate contact time as indicated for disinfecting solutions.  ? ?I'm seeing this patient by the request  of:  Binnie Rail, MD ? ?CC: Neck and back pain follow-up ? ?[id number]  ?Lindsay [name] is a 71 y.o. female coming in with complaint of back and neck pain. OMT 05/27/2021. Also f/u for R carpal tunnel. Patient states had surgery almost 2 months ago doing well. Everything else same per usual. Has been having some issues with vertigo. No new complaints.  ? ?Medications patient has been prescribed: None ? ? ? ?  ? ? ? ? ?Reviewed prior external information including notes and imaging from previsou exam, outside providers and external EMR if available.  ? ?As well as notes that were available from care everywhere and other healthcare systems. ? ?Past medical history, social, surgical and family history all reviewed in electronic medical record.  No pertanent information unless stated regarding to the chief complaint.  ? ?Past Medical History:  ?Diagnosis Date  ? COMMON MIGRAINE 09/20/2006  ? DEPRESSION 06/11/2009  ? GERD 06/11/2009  ? Hypertension 04/17/2018  ? Neck pain   ? eval with sports medicine in 2016/17  ? Osteopenia 04/17/2018  ?  ?No Known Allergies ? ? ?Review of Systems: ? No headache, visual changes, nausea, vomiting, diarrhea, constipation, dizziness, abdominal pain, skin rash, fevers, chills, night sweats, weight loss, swollen lymph nodes, body aches, joint swelling, chest pain, shortness of breath, mood changes. POSITIVE muscle aches ? ?Objective  ?Blood pressure 130/90, pulse 62, height  '5\' 5"'$  (1.651 m), weight 128 lb (58.1 kg), SpO2 98 %. ?  ?General: No apparent distress alert and oriented x3 mood and affect normal, dressed appropriately.  ?HEENT: Pupils equal, extraocular movements intact  ?Respiratory: Patient's speak in full sentences and does not appear short of breath  ?Cardiovascular: No lower extremity edema, non tender, no erythema  ?Patient does have some loss of lordosis of the neck.  Has a symptom limited sidebending bilaterally.  Patient does have some arthritic changes in the hands as well but good grip strength noted. ? ?Osteopathic findings ? ?C2 flexed rotated and side bent right ?C4 flexed rotated and side bent left ?C6 flexed rotated and side bent left ?T3 extended rotated and side bent right inhaled rib ?T8 extended rotated and side bent left ? ? ? ?  ?Assessment and Plan: ? ?Degenerative cervical disc ?Chronic problems noted.  Mild exacerbation, has had more discomfort recently as well.  Discussed which activities to do with this to avoid.  Increase activity slowly.  Follow-up again in 6 to 8 weeks  ? ?Nonallopathic problems ? ?Decision today to treat with OMT was based on Physical Exam ? ?After verbal consent patient was treated with HVLA, ME, FPR techniques in cervical, rib, thoracic,  areas ? ?Patient tolerated the procedure well with improvement in symptoms ? ?Patient given exercises, stretches and lifestyle modifications ? ?See medications in patient instructions if given ? ?Patient will follow up in 4-8 weeks ? ?  ? ? ?The above documentation has been reviewed and is accurate and complete [name], DO ? ? ? ?  ? ?  Note: This dictation was prepared with Dragon dictation along with smaller phrase technology. Any transcriptional errors that result from this process are unintentional.    ?  ?  ? ?

## 2021-07-08 ENCOUNTER — Encounter: Payer: Self-pay | Admitting: Family Medicine

## 2021-07-08 ENCOUNTER — Ambulatory Visit: Payer: Medicare HMO | Admitting: Family Medicine

## 2021-07-08 VITALS — BP 130/90 | HR 62 | Ht 65.0 in | Wt 128.0 lb

## 2021-07-08 DIAGNOSIS — M9908 Segmental and somatic dysfunction of rib cage: Secondary | ICD-10-CM | POA: Diagnosis not present

## 2021-07-08 DIAGNOSIS — M9902 Segmental and somatic dysfunction of thoracic region: Secondary | ICD-10-CM | POA: Diagnosis not present

## 2021-07-08 DIAGNOSIS — M9901 Segmental and somatic dysfunction of cervical region: Secondary | ICD-10-CM

## 2021-07-08 DIAGNOSIS — M5030 Other cervical disc degeneration, unspecified cervical region: Secondary | ICD-10-CM

## 2021-07-08 NOTE — Assessment & Plan Note (Signed)
Chronic problems noted.  Mild exacerbation, has had more discomfort recently as well.  Discussed which activities to do with this to avoid.  Increase activity slowly.  Follow-up again in 6 to 8 weeks ?

## 2021-07-08 NOTE — Patient Instructions (Addendum)
[place] [phone] ?Call Today for Lindsay [name]'s CT ? ?Good to see you! ?See you again in 6-8 weeks ? ? ?

## 2021-07-12 ENCOUNTER — Encounter: Payer: Self-pay | Admitting: Internal Medicine

## 2021-07-12 NOTE — Patient Instructions (Addendum)
? ? ? ?Blood work was ordered.   ? ? ?Medications changes include :   none ? ? ?Your prescription(s) have been sent to your pharmacy.  ? ? ?Return in about 1 year (around 07/14/2022) for CPE. ? ? ? ?Health Maintenance, Female ?Adopting a healthy lifestyle and getting preventive care are important in promoting health and wellness. Ask your health care provider about: ?The right schedule for you to have regular tests and exams. ?Things you can do on your own to prevent diseases and keep yourself healthy. ?What should I know about diet, weight, and exercise? ?Eat a healthy diet ? ?Eat a diet that includes plenty of vegetables, fruits, low-fat dairy products, and lean protein. ?Do not eat a lot of foods that are high in solid fats, added sugars, or sodium. ?Maintain a healthy weight ?Body mass index (BMI) is used to identify weight problems. It estimates body fat based on height and weight. Your health care provider can help determine your BMI and help you achieve or maintain a healthy weight. ?Get regular exercise ?Get regular exercise. This is one of the most important things you can do for your health. Most adults should: ?Exercise for at least 150 minutes each week. The exercise should increase your heart rate and make you sweat (moderate-intensity exercise). ?Do strengthening exercises at least twice a week. This is in addition to the moderate-intensity exercise. ?Spend less time sitting. Even light physical activity can be beneficial. ?Watch cholesterol and blood lipids ?Have your blood tested for lipids and cholesterol at 69 years of age, then have this test every 5 years. ?Have your cholesterol levels checked more often if: ?Your lipid or cholesterol levels are high. ?You are older than 69 years of age. ?You are at high risk for heart disease. ?What should I know about cancer screening? ?Depending on your health history and family history, you may need to have cancer screening at various ages. This may include  screening for: ?Breast cancer. ?Cervical cancer. ?Colorectal cancer. ?Skin cancer. ?Lung cancer. ?What should I know about heart disease, diabetes, and high blood pressure? ?Blood pressure and heart disease ?High blood pressure causes heart disease and increases the risk of stroke. This is more likely to develop in people who have high blood pressure readings or are overweight. ?Have your blood pressure checked: ?Every 3-5 years if you are 68-5 years of age. ?Every year if you are 103 years old or older. ?Diabetes ?Have regular diabetes screenings. This checks your fasting blood sugar level. Have the screening done: ?Once every three years after age 69 if you are at a normal weight and have a low risk for diabetes. ?More often and at a younger age if you are overweight or have a high risk for diabetes. ?What should I know about preventing infection? ?Hepatitis B ?If you have a higher risk for hepatitis B, you should be screened for this virus. Talk with your health care provider to find out if you are at risk for hepatitis B infection. ?Hepatitis C ?Testing is recommended for: ?Everyone born from [birth year] through 1963. ?Anyone with known risk factors for hepatitis C. ?Sexually transmitted infections (STIs) ?Get screened for STIs, including gonorrhea and chlamydia, if: ?You are sexually active and are younger than 69 years of age. ?You are older than 69 years of age and your health care provider tells you that you are at risk for this type of infection. ?Your sexual activity has changed since you were last screened, and you are at  increased risk for chlamydia or gonorrhea. Ask your health care provider if you are at risk. ?Ask your health care provider about whether you are at high risk for HIV. Your health care provider may recommend a prescription medicine to help prevent HIV infection. If you choose to take medicine to prevent HIV, you should first get tested for HIV. You should then be tested every 3 months for as  long as you are taking the medicine. ?Pregnancy ?If you are about to stop having your period (premenopausal) and you may become pregnant, seek counseling before you get pregnant. ?Take 400 to 800 micrograms (mcg) of folic acid every day if you become pregnant. ?Ask for birth control (contraception) if you want to prevent pregnancy. ?Osteoporosis and menopause ?Osteoporosis is a disease in which the bones lose minerals and strength with aging. This can result in bone fractures. If you are 66 years old or older, or if you are at risk for osteoporosis and fractures, ask your health care provider if you should: ?Be screened for bone loss. ?Take a calcium or vitamin D supplement to lower your risk of fractures. ?Be given hormone replacement therapy (HRT) to treat symptoms of menopause. ?Follow these instructions at home: ?Alcohol use ?Do not drink alcohol if: ?Your health care provider tells you not to drink. ?You are pregnant, may be pregnant, or are planning to become pregnant. ?If you drink alcohol: ?Limit how much you have to: ?0-1 drink a day. ?Know how much alcohol is in your drink. In the [place] one drink equals one 12 oz bottle of beer (355 mL), one 5 oz glass of wine (148 mL), or one 1? oz glass of hard liquor (44 mL). ?Lifestyle ?Do not use any products that contain nicotine or tobacco. These products include cigarettes, chewing tobacco, and vaping devices, such as e-cigarettes. If you need help quitting, ask your health care provider. ?Do not use street drugs. ?Do not share needles. ?Ask your health care provider for help if you need support or information about quitting drugs. ?General instructions ?Schedule regular health, dental, and eye exams. ?Stay current with your vaccines. ?Tell your health care provider if: ?You often feel depressed. ?You have ever been abused or do not feel safe at home. ?Summary ?Adopting a healthy lifestyle and getting preventive care are important in promoting health and  wellness. ?Follow your health care provider's instructions about healthy diet, exercising, and getting tested or screened for diseases. ?Follow your health care provider's instructions on monitoring your cholesterol and blood pressure. ?This information is not intended to replace advice given to you by your health care provider. Make sure you discuss any questions you have with your health care provider. ?Document Revised: 08/10/2020 Document Reviewed: 08/10/2020 ?Elsevier Patient Education ? [place]. ? ?

## 2021-07-12 NOTE — Progress Notes (Signed)
? ? ?Subjective:  ? ? Patient ID: Lindsay [name], female    DOB: 01-20-1951, 71 y.o.   MRN: [id number] ? ? ?This visit occurred during the SARS-CoV-2 public health emergency.  Safety protocols were in place, including screening questions prior to the visit, additional usage of staff PPE, and extensive cleaning of exam room while observing appropriate contact time as indicated for disinfecting solutions. ? ? ? ?HPI ?Lindsay [name] is here for  ?Chief Complaint  ?Patient presents with  ? Annual Exam  ? ? ?Cold symptoms x 4 days - raspy voice, scratchy throat, nasal congestion, cough, sneezing.  It is improving.  She is taking otc cold meds ? ?Leathery feeling in bottom of feet when barefoot- no pain, N/T.  Has been going on for a while.  ? ? ? ? ? ?Medications and allergies reviewed with patient and updated if appropriate. ? ? ?Current Outpatient Medications on File Prior to Visit  ?Medication Sig Dispense Refill  ? amLODipine (NORVASC) 5 MG tablet Take 1 tablet (5 mg total) by mouth daily. 90 tablet 3  ? buPROPion (WELLBUTRIN SR) 150 MG 12 hr tablet Take 1 tablet (150 mg total) by mouth 2 (two) times daily. 180 tablet 3  ? Cholecalciferol (VITAMIN D-3 PO) Take 1 capsule by mouth daily.    ? Cyanocobalamin (VITAMIN B-12 PO) Take by mouth.    ? diphenhydramine-acetaminophen (TYLENOL PM) 25-500 MG TABS Take 2 tablets by mouth at bedtime as needed (for sleep).     ? docusate sodium (COLACE) 100 MG capsule Take 200 mg by mouth at bedtime.    ? estradiol (ESTRACE) 0.1 MG/GM vaginal cream Place 0.5 g vaginally 2 (two) times a week. Place 0.5g nightly for two weeks then twice a week after 30 g 11  ? fish oil-omega-3 fatty acids 1000 MG capsule Take 1 g by mouth daily.    ? gabapentin (NEURONTIN) 100 MG capsule TAKE 2 CAPSULES BY MOUTH AT BEDTIME. 180 capsule 0  ? hydrocortisone (ANUSOL-HC) 2.5 % rectal cream Place 1 application rectally 2 (two) times daily. 30 g 0  ? Inositol Niacinate (NO FLUSH NIACIN) 250 MG TABS Take 250 mg by mouth  in the morning.    ? MAGNESIUM PO Take 1 tablet by mouth at bedtime.     ? minocycline (MINOCIN) 50 MG capsule Take 1 capsule (50 mg total) by mouth 2 (two) times daily. 60 capsule 2  ? Misc Natural Products (TART CHERRY ADVANCED PO) Take 1 tablet by mouth at bedtime.     ? SUMAtriptan (IMITREX) 100 MG tablet TAKE 1/2-1 TABLET BY MOUTH AT ONSET MIGRAINE,MAY REPEAT ONCE 2HOURS IF NEED,MAX 2DOSES IN 24 HOURS 9 tablet 3  ? Turmeric (QC TUMERIC COMPLEX PO) Take by mouth.    ? ?No current facility-administered medications on file prior to visit.  ? ? ?Review of Systems  ?Constitutional:  Negative for fever.  ?HENT:  Positive for congestion and postnasal drip.   ?Eyes:  Negative for visual disturbance.  ?Respiratory:  Positive for cough. Negative for shortness of breath and wheezing.   ?Cardiovascular:  Negative for chest pain, palpitations and leg swelling.  ?Gastrointestinal:  Negative for abdominal pain, blood in stool, constipation, diarrhea and nausea.  ?Genitourinary:  Negative for dysuria.  ?Musculoskeletal:  Positive for neck pain. Negative for arthralgias and back pain.  ?Skin:  Negative for rash.  ?Neurological:  Positive for dizziness (recntly) and headaches (occ). Negative for light-headedness.  ?Psychiatric/Behavioral:  Positive for dysphoric mood. The patient is  nervous/anxious.   ? ?   ?Objective:  ? ?Vitals:  ? 07/13/21 0831  ?BP: 140/82  ?Pulse: 66  ?Temp: 98.1 ?F (36.7 ?C)  ?SpO2: 99%  ? ?Filed Weights  ? 07/13/21 0831  ?Weight: 126 lb 12.8 oz (57.5 kg)  ? ?Body mass index is 21.1 kg/m?. ? ?BP Readings from Last 3 Encounters:  ?07/13/21 140/82  ?07/08/21 130/90  ?05/27/21 132/80  ? ? ?Wt Readings from Last 3 Encounters:  ?07/13/21 126 lb 12.8 oz (57.5 kg)  ?07/08/21 128 lb (58.1 kg)  ?05/27/21 130 lb (59 kg)  ? ? ? ?  07/13/2021  ?  8:49 AM 07/07/2020  ? 12:08 PM 02/25/2020  ? 11:48 AM 07/04/2019  ?  8:57 AM 08/21/2018  ?  3:44 PM  ?Depression screen PHQ 2/9  ?Decreased Interest 1 0 0 0 0  ?Down, Depressed,  Hopeless 1 1 0 0 0  ?PHQ - 2 Score 2 1 0 0 0  ?Altered sleeping 0 0 0 0 0  ?Tired, decreased energy 0 1 0 0 0  ?Change in appetite 0 0 0 0 0  ?Feeling bad or failure about yourself  0 1 0 0 0  ?Trouble concentrating 0 0 0 0 0  ?Moving slowly or fidgety/restless  0 0 0 0  ?Suicidal thoughts 0 0 0 0 0  ?PHQ-9 Score 2 3 0 0 0  ?Difficult doing work/chores Not difficult at all Not difficult at all  Not difficult at all   ? ? ? ? ?  07/07/2020  ? 12:09 PM  ?GAD 7 : Generalized Anxiety Score  ?Nervous, Anxious, on Edge 1  ?Control/stop worrying 0  ?Worry too much - different things 1  ?Trouble relaxing 1  ?Restless 0  ?Easily annoyed or irritable 1  ?Afraid - awful might happen 1  ?Total GAD 7 Score 5  ?Anxiety Difficulty Not difficult at all  ? ? ? ? ?  ?Physical Exam ?Constitutional: She appears well-developed and well-nourished. No distress.  ?HENT:  ?Head: Normocephalic and atraumatic.  ?Right Ear: External ear normal. Normal ear canal and TM ?Left Ear: External ear normal.  Normal ear canal and TM ?Mouth/Throat: Oropharynx is clear and moist.  ?Eyes: Conjunctivae and EOM are normal.  ?Neck: Neck supple. No tracheal deviation present. No thyromegaly present.  ?No carotid bruit  ?Cardiovascular: Normal rate, regular rhythm and normal heart sounds.   ?No murmur heard.  No edema. ?Pulmonary/Chest: Effort normal and breath sounds normal. No respiratory distress. She has no wheezes. She has no rales.  ?Breast: deferred   ?Abdominal: Soft. She exhibits no distension. There is no tenderness.  ?Lymphadenopathy: She has no cervical adenopathy.  ?Skin: Skin is warm and dry. She is not diaphoretic.  ?Psychiatric: She has a normal mood and affect. Her behavior is normal.  ? ? ? ?Lab Results  ?Component Value Date  ? WBC 3.6 (L) 07/02/2020  ? HGB 14.2 07/02/2020  ? HCT 42.4 07/02/2020  ? PLT 287.0 07/02/2020  ? GLUCOSE 85 07/02/2020  ? CHOL 196 07/02/2020  ? TRIG 74.0 07/02/2020  ? HDL 79.40 07/02/2020  ? LDLDIRECT 125.5 12/28/2011   ? LDLCALC 102 (H) 07/02/2020  ? ALT 21 07/02/2020  ? AST 20 07/02/2020  ? NA 141 07/02/2020  ? K 3.7 07/02/2020  ? CL 105 07/02/2020  ? CREATININE 0.85 07/02/2020  ? BUN 20 07/02/2020  ? CO2 30 07/02/2020  ? TSH 2.21 07/02/2020  ? HGBA1C 5.6 08/15/2017  ? ? ? ? ?   ?  Assessment & Plan:  ? ?Physical exam: ?Screening blood work  ordered ?Exercise  regular - walking ?Weight  normal ?Substance abuse  none ? ? ?Reviewed recommended immunizations. ? ? ?Health Maintenance  ?Topic Date Due  ? INFLUENZA VACCINE  11/02/2021  ? MAMMOGRAM  08/21/2022  ? DEXA SCAN  12/17/2022  ? COLONOSCOPY (Pts 45-73yr Insurance coverage will need to be confirmed)  08/13/2024  ? TETANUS/TDAP  08/16/2027  ? Pneumonia Vaccine 69 Years old  Completed  ? COVID-19 Vaccine  Completed  ? Hepatitis C Screening  Completed  ? Zoster Vaccines- Shingrix  Completed  ? HPV VACCINES  Aged Out  ?  ? ? ? ? ? ? ?See Problem List for Assessment and Plan of chronic medical problems. ? ? ? ? ?

## 2021-07-13 ENCOUNTER — Ambulatory Visit (INDEPENDENT_AMBULATORY_CARE_PROVIDER_SITE_OTHER): Payer: Medicare HMO | Admitting: Internal Medicine

## 2021-07-13 ENCOUNTER — Encounter: Payer: Self-pay | Admitting: Internal Medicine

## 2021-07-13 VITALS — BP 140/82 | HR 66 | Temp 98.1°F | Ht 65.0 in | Wt 126.8 lb

## 2021-07-13 DIAGNOSIS — M85852 Other specified disorders of bone density and structure, left thigh: Secondary | ICD-10-CM

## 2021-07-13 DIAGNOSIS — K219 Gastro-esophageal reflux disease without esophagitis: Secondary | ICD-10-CM

## 2021-07-13 DIAGNOSIS — I10 Essential (primary) hypertension: Secondary | ICD-10-CM | POA: Diagnosis not present

## 2021-07-13 DIAGNOSIS — G43009 Migraine without aura, not intractable, without status migrainosus: Secondary | ICD-10-CM | POA: Diagnosis not present

## 2021-07-13 DIAGNOSIS — F418 Other specified anxiety disorders: Secondary | ICD-10-CM | POA: Diagnosis not present

## 2021-07-13 DIAGNOSIS — M85851 Other specified disorders of bone density and structure, right thigh: Secondary | ICD-10-CM | POA: Diagnosis not present

## 2021-07-13 DIAGNOSIS — Z0000 Encounter for general adult medical examination without abnormal findings: Secondary | ICD-10-CM | POA: Diagnosis not present

## 2021-07-13 DIAGNOSIS — R209 Unspecified disturbances of skin sensation: Secondary | ICD-10-CM

## 2021-07-13 DIAGNOSIS — R69 Illness, unspecified: Secondary | ICD-10-CM | POA: Diagnosis not present

## 2021-07-13 LAB — COMPREHENSIVE METABOLIC PANEL
ALT: 25 U/L (ref 0–35)
AST: 27 U/L (ref 0–37)
Albumin: 4.4 g/dL (ref 3.5–5.2)
Alkaline Phosphatase: 88 U/L (ref 39–117)
BUN: 14 mg/dL (ref 6–23)
CO2: 28 mEq/L (ref 19–32)
Calcium: 9.3 mg/dL (ref 8.4–10.5)
Chloride: 105 mEq/L (ref 96–112)
Creatinine, Ser: 0.86 mg/dL (ref 0.40–1.20)
GFR: 69.28 mL/min (ref >60.00)
Glucose, Bld: 92 mg/dL (ref 70–99)
Potassium: 3.8 mEq/L (ref 3.5–5.1)
Sodium: 141 mEq/L (ref 135–145)
Total Bilirubin: 0.5 mg/dL (ref 0.2–1.2)
Total Protein: 7.1 g/dL (ref 6.0–8.3)

## 2021-07-13 LAB — CBC WITH DIFFERENTIAL/PLATELET
Basophils Absolute: 0 10*3/uL (ref 0.0–0.1)
Basophils Relative: 0.6 % (ref 0.0–3.0)
Eosinophils Absolute: 0.1 10*3/uL (ref 0.0–0.7)
Eosinophils Relative: 1.1 % (ref 0.0–5.0)
HCT: 41.9 % (ref 36.0–46.0)
Hemoglobin: 14.3 g/dL (ref 12.0–15.0)
Lymphocytes Relative: 27.3 % (ref 12.0–46.0)
Lymphs Abs: 1.3 10*3/uL (ref 0.7–4.0)
MCHC: 34.2 g/dL (ref 30.0–36.0)
MCV: 93.4 fl (ref 78.0–100.0)
Monocytes Absolute: 0.4 10*3/uL (ref 0.1–1.0)
Monocytes Relative: 9.4 % (ref 3.0–12.0)
Neutro Abs: 2.9 10*3/uL (ref 1.4–7.7)
Neutrophils Relative %: 61.6 % (ref 43.0–77.0)
Platelets: 258 10*3/uL (ref 150.0–400.0)
RBC: 4.49 Mil/uL (ref 3.87–5.11)
RDW: 12.8 % (ref 11.5–15.5)
WBC: 4.7 10*3/uL (ref 4.0–10.5)

## 2021-07-13 LAB — LIPID PANEL
Cholesterol: 182 mg/dL (ref 0–200)
HDL: 75.9 mg/dL (ref >39.00)
LDL Cholesterol: 95 mg/dL (ref 0–99)
NonHDL: 106.19
Total CHOL/HDL Ratio: 2
Triglycerides: 58 mg/dL (ref 0.0–149.0)
VLDL: 11.6 mg/dL (ref 0.0–40.0)

## 2021-07-13 LAB — VITAMIN D 25 HYDROXY (VIT D DEFICIENCY, FRACTURES): VITD: 39.56 ng/mL (ref 30.00–100.00)

## 2021-07-13 LAB — TSH: TSH: 1.57 u[IU]/mL (ref 0.35–5.50)

## 2021-07-13 MED ORDER — AMLODIPINE BESYLATE 5 MG PO TABS: 5.0000 mg | ORAL_TABLET | Freq: Every day | ORAL | 3 refills | Status: DC

## 2021-07-13 MED ORDER — BUPROPION HCL ER (SR) 150 MG PO TB12: 150.0000 mg | ORAL_TABLET | Freq: Two times a day (BID) | ORAL | 3 refills | Status: DC

## 2021-07-13 NOTE — Assessment & Plan Note (Signed)
Chronic ?Controlled, Stable ?Continue bupropion 150 mg twice daily ?

## 2021-07-13 NOTE — Assessment & Plan Note (Signed)
Chronic ?DEXA up-to-date ?Check vitamin D level ?Continue vitamin D supplementation ?Stressed regular exercise ?

## 2021-07-13 NOTE — Assessment & Plan Note (Signed)
Chronic ?Infrequent ?Continue Imitrex 50 mg as needed ?

## 2021-07-13 NOTE — Assessment & Plan Note (Addendum)
Chronic ?Has occasional GERD - take pepcid as needed ?occ dysphagia, occ gerd, dry cough intermittent -  ? gerd not ideally controlled ?Start pepcid 20 mg daily and monitor symptoms ?May need to see GI ?

## 2021-07-13 NOTE — Assessment & Plan Note (Signed)
Chronic °Blood pressure well controlled °CMP °Continue amlodipine 5 mg daily °

## 2021-07-15 ENCOUNTER — Encounter: Payer: Self-pay | Admitting: Physician Assistant

## 2021-07-15 NOTE — Progress Notes (Signed)
? ?  Follow-Up Visit ?  ?Subjective  ?Lindsay [name] is a 71 y.o. female who presents for the following: Annual Exam (Left arm x unknown just noticed it- "strange color sometimes", left groin- raised lesion, left under breast- no itch no bleed. No family or personal history of melanoma or non mole skin cancers. ). ? ? ?The following portions of the chart were reviewed this encounter and updated as appropriate:  Tobacco  Allergies  Meds  Problems  Med Hx  Surg Hx  Fam Hx   ?  ? ?Objective  ?Well appearing patient in no apparent distress; mood and affect are within normal limits. ? ?A full examination was performed including scalp, head, eyes, ears, nose, lips, neck, chest, axillae, abdomen, back, buttocks, bilateral upper extremities, bilateral lower extremities, hands, feet, fingers, toes, fingernails, and toenails. All findings within normal limits unless otherwise noted below. ? ?Left Upper Arm - Anterior ?Soft immobile nodule ? ?Left Flank (6), Left Malar Cheek, Left Thigh - Anterior, Right Forehead ?Stuck-on, crusted plaques.  ? ? ?Assessment & Plan  ?Lipoma of left upper extremity ?Left Upper Arm - Anterior ? ?Ok to leave if stable- surgical removal if begins to get large.  ? ?Inflamed seborrheic keratosis (9) ?Left Thigh - Anterior; Left Flank (6); Left Malar Cheek; Right Forehead ? ?Destruction of lesion - Left Flank, Left Malar Cheek, Left Thigh - Anterior, Right Forehead ?Complexity: simple   ?Destruction method: cryotherapy   ?Informed consent: discussed and consent obtained   ?Timeout:  patient name, date of birth, surgical site, and procedure verified ?Lesion destroyed using liquid nitrogen: Yes   ?Cryotherapy cycles:  1 ?Outcome: patient tolerated procedure well with no complications   ?Post-procedure details: wound care instructions given   ? ?No atypical nevi noted at the time of the visit. ? ?I, Rawlin Reaume, PA-C, have reviewed all documentation's for this visit.  The documentation on  07/15/21 for the exam, diagnosis, procedures and orders are all accurate and complete. ?

## 2021-07-28 NOTE — Addendum Note (Signed)
Addended by: Binnie Rail on: 07/28/2021 08:02 PM ? ? Modules accepted: Orders ? ?

## 2021-08-03 ENCOUNTER — Encounter: Payer: Self-pay | Admitting: Neurology

## 2021-08-04 NOTE — Addendum Note (Signed)
Addended by: Binnie Rail on: 08/04/2021 07:49 AM ? ? Modules accepted: Orders ? ?

## 2021-08-09 DIAGNOSIS — R69 Illness, unspecified: Secondary | ICD-10-CM | POA: Diagnosis not present

## 2021-08-09 DIAGNOSIS — F419 Anxiety disorder, unspecified: Secondary | ICD-10-CM | POA: Diagnosis not present

## 2021-08-09 DIAGNOSIS — I10 Essential (primary) hypertension: Secondary | ICD-10-CM | POA: Diagnosis not present

## 2021-08-09 DIAGNOSIS — K219 Gastro-esophageal reflux disease without esophagitis: Secondary | ICD-10-CM | POA: Diagnosis not present

## 2021-08-09 DIAGNOSIS — Z008 Encounter for other general examination: Secondary | ICD-10-CM | POA: Diagnosis not present

## 2021-08-09 DIAGNOSIS — Z79890 Hormone replacement therapy (postmenopausal): Secondary | ICD-10-CM | POA: Diagnosis not present

## 2021-08-09 DIAGNOSIS — Z803 Family history of malignant neoplasm of breast: Secondary | ICD-10-CM | POA: Diagnosis not present

## 2021-08-09 DIAGNOSIS — Z823 Family history of stroke: Secondary | ICD-10-CM | POA: Diagnosis not present

## 2021-08-09 DIAGNOSIS — G43909 Migraine, unspecified, not intractable, without status migrainosus: Secondary | ICD-10-CM | POA: Diagnosis not present

## 2021-08-09 DIAGNOSIS — Z87891 Personal history of nicotine dependence: Secondary | ICD-10-CM | POA: Diagnosis not present

## 2021-08-09 DIAGNOSIS — Z8249 Family history of ischemic heart disease and other diseases of the circulatory system: Secondary | ICD-10-CM | POA: Diagnosis not present

## 2021-08-09 DIAGNOSIS — N951 Menopausal and female climacteric states: Secondary | ICD-10-CM | POA: Diagnosis not present

## 2021-08-18 NOTE — Progress Notes (Signed)
Lindsay [name] [name] Phone: [phone] Subjective:   Lindsay [name] [name], am serving as a scribe for Dr. Hulan Saas.  This visit occurred during the SARS-CoV-2 public health emergency.  Safety protocols were in place, including screening questions prior to the visit, additional usage of staff PPE, and extensive cleaning of exam room while observing appropriate contact time as indicated for disinfecting solutions.    I'm seeing this patient by the request  of:  Binnie Rail, MD  CC neck pain follow-up  Lindsay [name]  Lindsay [name] [name] is a 71 y.o. female coming in with complaint of back and neck pain. OMT 07/08/2021. Patient states that is tight.   Medications patient has been prescribed: None  Taking:         Reviewed prior external information including notes and imaging from previsou exam, outside providers and external EMR if available.   As well as notes that were available from care everywhere and other healthcare systems.  Past medical history, social, surgical and family history all reviewed in electronic medical record.  [name] pertanent information unless stated regarding to the chief complaint.   Past Medical History:  Diagnosis Date   COMMON MIGRAINE 09/20/2006   DEPRESSION 06/11/2009   GERD 06/11/2009   Hypertension 04/17/2018   Neck pain    eval with sports medicine in 2016/17   Osteopenia 04/17/2018    [name] Known Allergies   Review of Systems:  [name] headache, visual changes, nausea, vomiting, diarrhea, constipation, dizziness, abdominal pain, skin rash, fevers, chills, night sweats, weight loss, swollen lymph nodes,, joint swelling, chest pain, shortness of breath, mood changes. POSITIVE muscle aches, body aches  Objective  Blood pressure 138/86, pulse (!) 44, height '5\' 5"'$  (1.651 m), SpO2 98 %.   General: [name] apparent distress alert and oriented x3 mood and affect normal, dressed appropriately.  HEENT:  Pupils equal, extraocular movements intact  Respiratory: Patient's speak in full sentences and does not appear short of breath  Cardiovascular: [name] lower extremity edema, non tender, [name] erythema  Neck exam does have some loss of lordosis. Tenderness to palpation in the paraspinal musculature.  Limited sidebending bilaterally.  5 out of 5 strength of the upper extremities  Osteopathic findings  C2 flexed rotated and side bent right C5 flexed rotated and side bent left T3 extended rotated and side bent right inhaled rib T9 extended rotated and side bent left       Assessment and Plan:  Degenerative cervical disc Chronic, with exacerbation, discussed icing regimen and home exercise, which activities to do and which ones to avoid, patient still has gabapentin if she needs it as well as muscle relaxers.  Given previously.  Responding well to manipulation and follow-up again in 6 weeks   Nonallopathic problems  Decision today to treat with OMT was based on Physical Exam  After verbal consent patient was treated with HVLA, ME, FPR techniques in cervical, rib, thoracic, lumbar, and sacral  areas  Patient tolerated the procedure well with improvement in symptoms  Patient given exercises, stretches and lifestyle modifications  See medications in patient instructions if given  Patient will follow up in 4-8 weeks      The above documentation has been reviewed and is accurate and complete Lindsay [name] Pulley, DO        Note: This dictation was prepared with Dragon dictation along with smaller phrase technology. Any transcriptional errors that result from this process are unintentional.

## 2021-08-19 ENCOUNTER — Ambulatory Visit: Payer: Medicare HMO | Admitting: Family Medicine

## 2021-08-19 VITALS — BP 138/86 | HR 44 | Ht 65.0 in

## 2021-08-19 DIAGNOSIS — M9901 Segmental and somatic dysfunction of cervical region: Secondary | ICD-10-CM | POA: Diagnosis not present

## 2021-08-19 DIAGNOSIS — M5030 Other cervical disc degeneration, unspecified cervical region: Secondary | ICD-10-CM | POA: Diagnosis not present

## 2021-08-19 DIAGNOSIS — M9902 Segmental and somatic dysfunction of thoracic region: Secondary | ICD-10-CM | POA: Diagnosis not present

## 2021-08-19 DIAGNOSIS — M9908 Segmental and somatic dysfunction of rib cage: Secondary | ICD-10-CM

## 2021-08-19 NOTE — Assessment & Plan Note (Signed)
Chronic, with exacerbation, discussed icing regimen and home exercise, which activities to do and which ones to avoid, patient still has gabapentin if she needs it as well as muscle relaxers.  Given previously.  Responding well to manipulation and follow-up again in 6 weeks

## 2021-08-19 NOTE — Patient Instructions (Signed)
Remember rye whiskey See me in 5-6 weeks

## 2021-08-24 ENCOUNTER — Ambulatory Visit
Admission: RE | Admit: 2021-08-24 | Discharge: 2021-08-24 | Disposition: A | Discharge status: Home / Self Care | Payer: Medicare HMO | Source: Ambulatory Visit | Attending: Internal Medicine | Admitting: Internal Medicine

## 2021-08-24 DIAGNOSIS — Z1231 Encounter for screening mammogram for malignant neoplasm of breast: Secondary | ICD-10-CM | POA: Diagnosis not present

## 2021-08-26 DIAGNOSIS — G5602 Carpal tunnel syndrome, left upper limb: Secondary | ICD-10-CM | POA: Diagnosis not present

## 2021-08-31 ENCOUNTER — Telehealth: Payer: Self-pay | Admitting: Internal Medicine

## 2021-08-31 NOTE — Telephone Encounter (Signed)
LVM for pt to rtn my call to schedule AWV with [facility]. Call back # [phone] 

## 2021-09-07 ENCOUNTER — Encounter: Payer: Self-pay | Admitting: Neurology

## 2021-09-07 ENCOUNTER — Ambulatory Visit: Payer: Medicare HMO | Admitting: Neurology

## 2021-09-07 VITALS — BP 143/85 | HR 59 | Ht 65.0 in | Wt 129.0 lb

## 2021-09-07 DIAGNOSIS — R202 Paresthesia of skin: Secondary | ICD-10-CM | POA: Diagnosis not present

## 2021-09-07 NOTE — Progress Notes (Signed)
Chief Complaint  Patient presents with   New Patient (Initial Visit)    Room 15 NP/Internal referral for skin sensation disturbance in both feet sx started 2-3 months ago, denies any pain, numbness or tingling        ASSESSMENT AND PLAN  Lindsay [name] is a 71 y.o. female   Bilateral foot discomfort,  No sensory loss, essentially normal examination,  After discussed with patient, decided to continue to observe her symptoms, call clinic for worsening symptoms,   DIAGNOSTIC DATA (LABS, IMAGING, TESTING) - I reviewed patient records, labs, notes, testing and imaging myself where available.   MEDICAL HISTORY:  Lindsay [name], is a 71 year old female, seen in request by her primary care physician Dr. Quay Burow, Claudina Lick, for evaluation of bilateral foot discomfort, initial evaluation was on September 07, 2021  I reviewed and summarized the referring note. PMHX. HTN Depression, Anxiety Chronic migraine Chronic neck pain, right cervical radicular pain, epidural injection in the past.  She is still very active taking care of her grandchildren without any difficulties, but around beginning of 2023, she noticed bilateral foot discomfort only noticed that when she is barefoot bearing weight, there is no difficulty walking, no persistent sensory loss, did not feel that way when she have her shoes on, she described her feet uncomfortable sensation if she walk on her barefoot, as if her feet has been wrapped in ACE bandage,  She denies significant low back pain, no bowel or bladder incontinence, She does have intermittent right hand paresthesia, with diagnosis of right carpal tunnel syndrome  PHYSICAL EXAM:   Vitals:   09/07/21 0756  BP: (!) 143/85  Pulse: (!) 59  Weight: 129 lb (58.5 kg)  Height: '5\' 5"'$  (1.651 m)   Not recorded     Body mass index is 21.47 kg/m.  PHYSICAL EXAMNIATION:  Gen: NAD, conversant, well nourised, well groomed                     Cardiovascular: Regular rate  rhythm, no peripheral edema, warm, nontender. Eyes: Conjunctivae clear without exudates or hemorrhage Neck: Supple, no carotid bruits. Pulmonary: Clear to auscultation bilaterally   NEUROLOGICAL EXAM:  MENTAL STATUS: Speech/cognition: Awake, alert, oriented to history taking and casual conversation CRANIAL NERVES: CN II: Visual fields are full to confrontation. Pupils are round equal and briskly reactive to light. CN III, IV, VI: extraocular movement are normal. No ptosis. CN V: Facial sensation is intact to light touch CN VII: Face is symmetric with normal eye closure  CN VIII: Hearing is normal to causal conversation. CN IX, X: Phonation is normal. CN XI: Head turning and shoulder shrug are intact  MOTOR: There is no pronator drift of out-stretched arms. Muscle bulk and tone are normal. Muscle strength is normal.  REFLEXES: Reflexes are 2+ and symmetric at the biceps, triceps, knees, and ankles. Plantar responses are flexor.  SENSORY: Intact to light touch, pinprick and vibratory sensation are intact in fingers and toes.  COORDINATION: There is no trunk or limb dysmetria noted.  GAIT/STANCE: Posture is normal. Gait is steady with normal steps, base, arm swing, and turning. Heel and toe walking are normal. Tandem gait is normal.  Romberg is absent.  REVIEW OF SYSTEMS:  Full 14 system review of systems performed and notable only for as above All other review of systems were negative.   ALLERGIES: No Known Allergies  HOME MEDICATIONS: Current Outpatient Medications  Medication Sig Dispense Refill   amLODipine (NORVASC)  5 MG tablet Take 1 tablet (5 mg total) by mouth daily. 90 tablet 3   buPROPion (WELLBUTRIN SR) 150 MG 12 hr tablet Take 1 tablet (150 mg total) by mouth 2 (two) times daily. 180 tablet 3   Cholecalciferol (VITAMIN D-3 PO) Take 1 capsule by mouth daily.     Cyanocobalamin (VITAMIN B-12 PO) Take by mouth.     diphenhydramine-acetaminophen (TYLENOL PM)  25-500 MG TABS Take 2 tablets by mouth at bedtime as needed (for sleep).      docusate sodium (COLACE) 100 MG capsule Take 200 mg by mouth at bedtime.     estradiol (ESTRACE) 0.1 MG/GM vaginal cream Place 0.5 g vaginally 2 (two) times a week. Place 0.5g nightly for two weeks then twice a week after 30 g 11   fish oil-omega-3 fatty acids 1000 MG capsule Take 1 g by mouth daily.     hydrocortisone (ANUSOL-HC) 2.5 % rectal cream Place 1 application rectally 2 (two) times daily. 30 g 0   Inositol Niacinate (NO FLUSH NIACIN) 250 MG TABS Take 250 mg by mouth in the morning.     MAGNESIUM PO Take 1 tablet by mouth at bedtime.      Misc Natural Products (TART CHERRY ADVANCED PO) Take 1 tablet by mouth at bedtime.      SUMAtriptan (IMITREX) 100 MG tablet TAKE 1/2-1 TABLET BY MOUTH AT ONSET MIGRAINE,MAY REPEAT ONCE 2HOURS IF NEED,MAX 2DOSES IN 24 HOURS 9 tablet 3   Turmeric (QC TUMERIC COMPLEX PO) Take by mouth.     vitamin E 180 MG (400 UNITS) capsule Take 400 Units by mouth daily.     No current facility-administered medications for this visit.    PAST MEDICAL HISTORY: Past Medical History:  Diagnosis Date   COMMON MIGRAINE 09/20/2006   DEPRESSION 06/11/2009   GERD 06/11/2009   Hypertension 04/17/2018   Neck pain    eval with sports medicine in 2016/17   Osteopenia 04/17/2018    PAST SURGICAL HISTORY: Past Surgical History:  Procedure Laterality Date   ABDOMINAL HYSTERECTOMY     BILATERAL OOPHORECTOMY     CHOLECYSTECTOMY     OVARIAN CYST REMOVAL     REPAIR EXTENSOR TENDON HAND     TONSILLECTOMY      FAMILY HISTORY: Family History  Problem Relation Age of Onset   Dementia Mother    Osteoporosis Mother    Breast cancer Sister 43   Heart attack Brother    Colon cancer Neg Hx    Esophageal cancer Neg Hx    Rectal cancer Neg Hx    Stomach cancer Neg Hx     SOCIAL HISTORY: Social History   Socioeconomic History   Marital status: Married    Spouse name: Not on file   Number of  children: Not on file   Years of education: Not on file   Highest education level: Not on file  Occupational History   Not on file  Tobacco Use   Smoking status: Former    Types: Cigarettes    Quit date: 04/04/2004    Years since quitting: 17.4   Smokeless tobacco: Never  Vaping Use   Vaping Use: Never used  Substance and Sexual Activity   Alcohol use: Yes    Alcohol/week: 0.0 standard drinks    Comment: occ   Drug use: No   Sexual activity: Not Currently  Other Topics Concern   Not on file  Social History Narrative   Not on file   Social  Determinants of Health   Financial Resource Strain: Not on file  Food Insecurity: Not on file  Transportation Needs: Not on file  Physical Activity: Not on file  Stress: Not on file  Social Connections: Not on file  Intimate Partner Violence: Not on file      Marcial Pacas, M.D. Ph.D.  [facility] [address] [place], [place] [address] Ph: [phone] Fax: [phone]  CC:  Binnie Rail, MD [place],  [place] [address]  Binnie Rail, MD

## 2021-09-16 ENCOUNTER — Ambulatory Visit (INDEPENDENT_AMBULATORY_CARE_PROVIDER_SITE_OTHER): Payer: Medicare HMO

## 2021-09-16 DIAGNOSIS — Z0000 Encounter for general adult medical examination without abnormal findings: Secondary | ICD-10-CM

## 2021-09-16 NOTE — Progress Notes (Signed)
Subjective:   Lindsay [name] is a 71 y.o. female who presents for Medicare Annual (Subsequent) preventive examination.  I connected with Kam today by telephone and verified that I am speaking with the correct person using two identifiers. I discussed the limitations, risks, security and privacy concerns of performing an evaluation and management service by telephone and the availability of in person appointments. I also discussed with the patient that there may be a patient responsible charge related to this service. The patient expressed understanding and agreed to proceed. Location patient: home Location provider: work  Persons participating in the virtual visit: [employer] and Jillene Bucks, [place].   Review of Systems    No ROS. Medicare Wellness Telephone Visit. Additional risk factors are reflected in social history. Cardiac Risk Factors include: advanced age (>41mn, >>26women)     Objective:    There were no vitals filed for this visit. There is no height or weight on file to calculate BMI.     09/16/2021    4:25 PM  Advanced Directives  Does Patient Have a Medical Advance Directive? No    Current Medications (verified) Outpatient Encounter Medications as of 09/16/2021  Medication Sig   amLODipine (NORVASC) 5 MG tablet Take 1 tablet (5 mg total) by mouth daily.   buPROPion (WELLBUTRIN SR) 150 MG 12 hr tablet Take 1 tablet (150 mg total) by mouth 2 (two) times daily.   Cholecalciferol (VITAMIN D-3 PO) Take 1 capsule by mouth daily.   Cyanocobalamin (VITAMIN B-12 PO) Take by mouth.   diphenhydramine-acetaminophen (TYLENOL PM) 25-500 MG TABS Take 2 tablets by mouth at bedtime as needed (for sleep).    docusate sodium (COLACE) 100 MG capsule Take 200 mg by mouth at bedtime.   estradiol (ESTRACE) 0.1 MG/GM vaginal cream Place 0.5 g vaginally 2 (two) times a week. Place 0.5g nightly for two weeks then twice a week after   fish oil-omega-3 fatty acids 1000 MG capsule Take 1 g by  mouth daily.   hydrocortisone (ANUSOL-HC) 2.5 % rectal cream Place 1 application rectally 2 (two) times daily.   Inositol Niacinate (NO FLUSH NIACIN) 250 MG TABS Take 250 mg by mouth in the morning.   MAGNESIUM PO Take 1 tablet by mouth at bedtime.    Misc Natural Products (TART CHERRY ADVANCED PO) Take 1 tablet by mouth at bedtime.    SUMAtriptan (IMITREX) 100 MG tablet TAKE 1/2-1 TABLET BY MOUTH AT ONSET MIGRAINE,MAY REPEAT ONCE 2HOURS IF NEED,MAX 2DOSES IN 24 HOURS   Turmeric (QC TUMERIC COMPLEX PO) Take by mouth.   vitamin E 180 MG (400 UNITS) capsule Take 400 Units by mouth daily.   No facility-administered encounter medications on file as of 09/16/2021.    Allergies (verified) Patient has no known allergies.   History: Past Medical History:  Diagnosis Date   COMMON MIGRAINE 09/20/2006   DEPRESSION 06/11/2009   GERD 06/11/2009   Hypertension 04/17/2018   Neck pain    eval with sports medicine in 2016/17   Osteopenia 04/17/2018   Past Surgical History:  Procedure Laterality Date   ABDOMINAL HYSTERECTOMY     BILATERAL OOPHORECTOMY     CHOLECYSTECTOMY     OVARIAN CYST REMOVAL     REPAIR EXTENSOR TENDON HAND     TONSILLECTOMY     Family History  Problem Relation Age of Onset   Dementia Mother    Osteoporosis Mother    Breast cancer Sister 317  Heart attack Brother    Colon  cancer Neg Hx    Esophageal cancer Neg Hx    Rectal cancer Neg Hx    Stomach cancer Neg Hx    Social History   Socioeconomic History   Marital status: Married    Spouse name: Not on file   Number of children: Not on file   Years of education: Not on file   Highest education level: Not on file  Occupational History   Not on file  Tobacco Use   Smoking status: Former    Types: Cigarettes    Quit date: 04/04/2004    Years since quitting: 17.4   Smokeless tobacco: Never  Vaping Use   Vaping Use: Never used  Substance and Sexual Activity   Alcohol use: Yes    Alcohol/week: 0.0 standard drinks  of alcohol    Comment: occ   Drug use: No   Sexual activity: Not Currently  Other Topics Concern   Not on file  Social History Narrative   Not on file   Social Determinants of Health   Financial Resource Strain: Low Risk  (09/16/2021)   Overall Financial Resource Strain (CARDIA)    Difficulty of Paying Living Expenses: Not hard at all  Food Insecurity: No Food Insecurity (09/16/2021)   Hunger Vital Sign    Worried About Running Out of Food in the Last Year: Never true    Ran Out of Food in the Last Year: Never true  Transportation Needs: No Transportation Needs (09/16/2021)   PRAPARE - Hydrologist (Medical): No    Lack of Transportation (Non-Medical): No  Physical Activity: Sufficiently Active (09/16/2021)   Exercise Vital Sign    Days of Exercise per Week: 3 days    Minutes of Exercise per Session: 60 min  Stress: No Stress Concern Present (09/16/2021)   [place]    Feeling of Stress : Not at all  Social Connections: Moderately Isolated (09/16/2021)   Social Connection and Isolation Panel [NHANES]    Frequency of Communication with Friends and Family: More than three times a week    Frequency of Social Gatherings with Friends and Family: Three times a week    Attends Religious Services: Never    Active Member of Clubs or Organizations: No    Attends Music therapist: Never    Marital Status: Married    Tobacco Counseling Counseling given: Not Answered   Clinical Intake:  Pre-visit preparation completed: Yes  Pain : No/denies pain     Nutritional Risks: None Diabetes: No  How often do you need to have someone help you when you read instructions, pamphlets, or other written materials from your doctor or pharmacy?: 1 - Never What is the last grade level you completed in school?: 12th grade  Diabetic? No  Interpreter Needed?: No  Information entered by ::  Jillene Bucks, CMA   Activities of Daily Living    09/16/2021    4:26 PM  In your present state of health, do you have any difficulty performing the following activities:  Hearing? 0  Vision? 0  Difficulty concentrating or making decisions? 0  Walking or climbing stairs? 0  Dressing or bathing? 0  Doing errands, shopping? 0  Preparing Food and eating ? N  Using the Toilet? N  In the past six months, have you accidently leaked urine? N  Do you have problems with loss of bowel control? N  Managing your Medications? N  Managing  your Finances? N  Housekeeping or managing your Housekeeping? N    Patient Care Team: Binnie Rail, MD as PCP - General (Internal Medicine) Lomax, Marny Lowenstein, MD (Inactive) (Obstetrics and Gynecology) Warren Danes, PA-C as Physician Assistant (Dermatology)  Indicate any recent Medical Services you may have received from other than [facility] providers in the past year (date may be approximate).     Assessment:   This is a routine wellness examination for [place].  Hearing/Vision screen Patient denied any hearing difficulty. No hearing aids. Patient does wear corrective lenses/contacts.   Dietary issues and exercise activities discussed: Current Exercise Habits: Home exercise routine, Type of exercise: walking, Time (Minutes): 60, Frequency (Times/Week): 3, Weekly Exercise (Minutes/Week): 180, Intensity: Mild, Exercise limited by: None identified   Goals Addressed   None   Patient denied any goals  Depression Screen    09/16/2021    4:26 PM 09/16/2021    4:23 PM 07/13/2021    8:49 AM 07/07/2020   12:08 PM 02/25/2020   11:48 AM 07/04/2019    8:57 AM 08/21/2018    3:44 PM  PHQ 2/9 Scores  PHQ - 2 Score 0 0 2 1 0 0 0  PHQ- 9 Score   2 3 0 0 0    Fall Risk    09/16/2021    4:26 PM 07/13/2021    8:49 AM 07/07/2020   10:01 AM 07/04/2019    8:56 AM 04/17/2018    2:50 PM  Fall Risk   Falls in the past year? 0 1 0 1 0  Number falls in past yr: 0 0 0 0  0  Injury with Fall? 0 0 0 1 0  Risk for fall due to : No Fall Risks No Fall Risks No Fall Risks    Follow up Falls evaluation completed Falls evaluation completed Falls evaluation completed      [place]:  Any stairs in or around the home? Yes  If so, are there any without handrails? Yes  Home free of loose throw rugs in walkways, pet beds, electrical cords, etc? Yes  Adequate lighting in your home to reduce risk of falls? Yes   ASSISTIVE DEVICES UTILIZED TO PREVENT FALLS:  Life alert? No  Use of a cane, walker or w/c? No  Grab bars in the bathroom? No  Shower chair or bench in shower? No  Elevated toilet seat or a handicapped toilet? No   TIMED UP AND GO:  Was the test performed? No this was a phone visit.  Length of time to ambulate 10 feet: N/A sec.    Cognitive Function:  Patient is cogitatively intact.      Immunizations Immunization History  Administered Date(s) Administered   Fluad Quad(high Dose 65+) 12/25/2018, 02/29/2020, 01/18/2021   Influenza Split 01/05/2012, 01/16/2013   Influenza Whole 01/25/2007, 01/04/2008   Influenza-Unspecified 01/31/2014, 01/02/2017, 01/09/2018   Meningococcal Polysaccharide 01/16/2013   PFIZER(Purple Top)SARS-COV-2 Vaccination 04/30/2019, 05/21/2019, 01/01/2020   Pfizer Covid-19 Vaccine Bivalent Booster 71yr & up 02/15/2021   Pneumococcal Conjugate-13 01/31/2014   Pneumococcal Polysaccharide-23 01/09/2018   Td 08/13/2007   Tdap 08/13/2007, 08/15/2017   Zoster Recombinat (Shingrix) 11/12/2020, 03/12/2021    TDAP status: Up to date  Flu Vaccine status: Up to date  Pneumococcal vaccine status: Up to date  Covid-19 vaccine status: Completed vaccines  Qualifies for Shingles Vaccine? Yes   Zostavax completed No   Shingrix Completed?: Yes  Screening Tests Health Maintenance  Topic Date  Due   INFLUENZA VACCINE  11/02/2021   DEXA SCAN  12/17/2022   MAMMOGRAM  08/25/2023   COLONOSCOPY  (Pts 45-48yr Insurance coverage will need to be confirmed)  08/13/2024   TETANUS/TDAP  08/16/2027   Pneumonia Vaccine 69 Years old  Completed   COVID-19 Vaccine  Completed   Hepatitis C Screening  Completed   Zoster Vaccines- Shingrix  Completed   HPV VACCINES  Aged Out    Health Maintenance  There are no preventive care reminders to display for this patient.  Colorectal cancer screening: Type of screening: Colonoscopy. Completed 08/14/2014. Repeat every 10 years  Mammogram status: Completed 08/24/2021. Repeat every year  Bone Density status: Completed 12/15/2019. Results reflect: Bone density results: NORMAL. Repeat every 3 years.  Lung Cancer Screening: (Low Dose CT Chest recommended if Age 69-80years, 30 pack-year currently smoking OR have quit w/in 15years.) does not qualify.   Lung Cancer Screening Referral: N/A  Additional Screening:  Hepatitis C Screening: does qualify; Completed 08/11/2016  Vision Screening: Recommended annual ophthalmology exams for early detection of glaucoma and other disorders of the eye. Is the patient up to date with their annual eye exam?  Yes  Who is the provider or what is the name of the office in which the patient attends annual eye exams? [facility] [address] [place] pt is not established with a provider, would they like to be referred to a provider to establish care? No .   Dental Screening: Recommended annual dental exams for proper oral hygiene  Community Resource Referral / Chronic Care Management: CRR required this visit?  No   CCM required this visit?  No      Plan:     I have personally reviewed and noted the following in the patient's chart:   Medical and social history Use of alcohol, tobacco or illicit drugs  Current medications and supplements including opioid prescriptions.  Functional ability and status Nutritional status Physical activity Advanced directives List of other physicians Hospitalizations,  surgeries, and ER visits in previous 12 months Vitals Screenings to include cognitive, depression, and falls Referrals and appointments  In addition, I have reviewed and discussed with patient certain preventive protocols, quality metrics, and best practice recommendations. A written personalized care plan for preventive services as well as general preventive health recommendations were provided to patient.     HRossie Muskrat [place]  09/16/2021   Nurse Notes:   Time Spent with patient on telephone encounter: 12 mins

## 2021-09-16 NOTE — Patient Instructions (Addendum)
It was great speaking with you today!  Please schedule your next Medicare Wellness Visit with your Nurse Health Advisor in 1 year by calling [phone]. 

## 2021-09-21 ENCOUNTER — Ambulatory Visit: Payer: Self-pay | Admitting: Neurology

## 2021-09-23 NOTE — Progress Notes (Unsigned)
  [place] Phone: [phone] Subjective:    I'm seeing this patient by the request  of:  Binnie Rail, MD  CC:   Lindsay [name]  Lindsay [name] is a 71 y.o. female coming in with complaint of back and neck pain. OMT 08/19/2021. Patient states   Medications patient has been prescribed: None  Taking:         Reviewed prior external information including notes and imaging from previsou exam, outside providers and external EMR if available.   As well as notes that were available from care everywhere and other healthcare systems.  Past medical history, social, surgical and family history all reviewed in electronic medical record.  No pertanent information unless stated regarding to the chief complaint.   Past Medical History:  Diagnosis Date   COMMON MIGRAINE 09/20/2006   DEPRESSION 06/11/2009   GERD 06/11/2009   Hypertension 04/17/2018   Neck pain    eval with sports medicine in 2016/17   Osteopenia 04/17/2018    No Known Allergies   Review of Systems:  No headache, visual changes, nausea, vomiting, diarrhea, constipation, dizziness, abdominal pain, skin rash, fevers, chills, night sweats, weight loss, swollen lymph nodes, body aches, joint swelling, chest pain, shortness of breath, mood changes. POSITIVE muscle aches  Objective  There were no vitals taken for this visit.   General: No apparent distress alert and oriented x3 mood and affect normal, dressed appropriately.  HEENT: Pupils equal, extraocular movements intact  Respiratory: Patient's speak in full sentences and does not appear short of breath  Cardiovascular: No lower extremity edema, non tender, no erythema  Gait MSK:  Back   Osteopathic findings  C2 flexed rotated and side bent right C6 flexed rotated and side bent left T3 extended rotated and side bent right inhaled rib T9 extended rotated and side bent left L2 flexed rotated and  side bent right Sacrum right on right       Assessment and Plan:  No problem-specific Assessment & Plan notes found for this encounter.    Nonallopathic problems  Decision today to treat with OMT was based on Physical Exam  After verbal consent patient was treated with HVLA, ME, FPR techniques in cervical, rib, thoracic, lumbar, and sacral  areas  Patient tolerated the procedure well with improvement in symptoms  Patient given exercises, stretches and lifestyle modifications  See medications in patient instructions if given  Patient will follow up in 4-8 weeks             Note: This dictation was prepared with Dragon dictation along with smaller phrase technology. Any transcriptional errors that result from this process are unintentional.

## 2021-09-28 ENCOUNTER — Ambulatory Visit: Payer: Medicare HMO | Admitting: Family Medicine

## 2021-09-28 ENCOUNTER — Ambulatory Visit: Payer: Self-pay | Admitting: Neurology

## 2021-09-28 VITALS — BP 110/78 | HR 71 | Ht 65.0 in | Wt 127.0 lb

## 2021-09-28 DIAGNOSIS — M9901 Segmental and somatic dysfunction of cervical region: Secondary | ICD-10-CM | POA: Diagnosis not present

## 2021-09-28 DIAGNOSIS — M9908 Segmental and somatic dysfunction of rib cage: Secondary | ICD-10-CM

## 2021-09-28 DIAGNOSIS — M5030 Other cervical disc degeneration, unspecified cervical region: Secondary | ICD-10-CM | POA: Diagnosis not present

## 2021-09-28 DIAGNOSIS — M9902 Segmental and somatic dysfunction of thoracic region: Secondary | ICD-10-CM | POA: Diagnosis not present

## 2021-09-28 DIAGNOSIS — M9903 Segmental and somatic dysfunction of lumbar region: Secondary | ICD-10-CM | POA: Diagnosis not present

## 2021-09-28 DIAGNOSIS — M9904 Segmental and somatic dysfunction of sacral region: Secondary | ICD-10-CM | POA: Diagnosis not present

## 2021-11-02 ENCOUNTER — Encounter: Payer: Self-pay | Admitting: Physician Assistant

## 2021-11-04 ENCOUNTER — Encounter: Payer: Self-pay | Admitting: Internal Medicine

## 2021-11-04 NOTE — Progress Notes (Signed)
  [facility] [place] [address] [place] Phone: [phone] Subjective:   IVilma [name], am serving as a scribe for Dr. Hulan Saas.  I'm seeing this patient by the request  of:  Binnie Rail, MD  CC: Neck and back pain follow-up  [id number]  Lindsay [name] is a 71 y.o. female coming in with complaint of back and neck pain. OMT 09/28/2021. Patient states same per usual. No new issues.Still having neck pain fairly regularly.  Medications patient has been prescribed: None  Taking:         Reviewed prior external information including notes and imaging from previsou exam, outside providers and external EMR if available.   As well as notes that were available from care everywhere and other healthcare systems.  Past medical history, social, surgical and family history all reviewed in electronic medical record.  No pertanent information unless stated regarding to the chief complaint.   Past Medical History:  Diagnosis Date   COMMON MIGRAINE 09/20/2006   DEPRESSION 06/11/2009   GERD 06/11/2009   Hypertension 04/17/2018   Neck pain    eval with sports medicine in 2016/17   Osteopenia 04/17/2018    No Known Allergies   Review of Systems:  No headache, visual changes, nausea, vomiting, diarrhea, constipation, dizziness, abdominal pain, skin rash, fevers, chills, night sweats, weight loss, swollen lymph nodes, body aches, joint swelling, chest pain, shortness of breath, mood changes. POSITIVE muscle aches  Objective  Blood pressure 112/78, pulse 72, height '5\' 5"'$  (1.651 m), weight 127 lb (57.6 kg), SpO2 96 %.   General: No apparent distress alert and oriented x3 mood and affect normal, dressed appropriately.  HEENT: Pupils equal, extraocular movements intact  Respiratory: Patient's speak in full sentences and does not appear short of breath  Cardiovascular: No lower extremity edema, non tender, no erythema  Gait MSK:  Back does have  some loss of lordosis.  Arthritic changes in multiple areas.  Neck exam does have significant crepitus.  Tightness noted.  Negative Spurling's today though.  Osteopathic findings  C5 flexed rotated and side bent left C7 flexed rotated and side bent right T3 extended rotated and side bent right inhaled rib T7 extended rotated and side bent left L2 flexed rotated and side bent right Sacrum right on right       Assessment and Plan:  Degenerative cervical disc DDD noted, discussed HEP, chronic problem with exacerbation.  Patient has had gabapentin in the past but has not been taking it very frequently.  He does try to take more natural aspects.  Follow-up again in 6 to 8 weeks    Nonallopathic problems  Decision today to treat with OMT was based on Physical Exam  After verbal consent patient was treated with HVLA, ME, FPR techniques in cervical, rib, thoracic, lumbar, and sacral  areas  Patient tolerated the procedure well with improvement in symptoms  Patient given exercises, stretches and lifestyle modifications  See medications in patient instructions if given  Patient will follow up in 4-8 weeks    The above documentation has been reviewed and is accurate and complete [name], DO          Note: This dictation was prepared with Dragon dictation along with smaller phrase technology. Any transcriptional errors that result from this process are unintentional.

## 2021-11-09 ENCOUNTER — Ambulatory Visit: Payer: Medicare HMO | Admitting: Family Medicine

## 2021-11-09 VITALS — BP 112/78 | HR 72 | Ht 65.0 in | Wt 127.0 lb

## 2021-11-09 DIAGNOSIS — M5030 Other cervical disc degeneration, unspecified cervical region: Secondary | ICD-10-CM

## 2021-11-09 DIAGNOSIS — M9908 Segmental and somatic dysfunction of rib cage: Secondary | ICD-10-CM

## 2021-11-09 DIAGNOSIS — M9901 Segmental and somatic dysfunction of cervical region: Secondary | ICD-10-CM

## 2021-11-09 DIAGNOSIS — M9902 Segmental and somatic dysfunction of thoracic region: Secondary | ICD-10-CM

## 2021-11-09 DIAGNOSIS — M9904 Segmental and somatic dysfunction of sacral region: Secondary | ICD-10-CM | POA: Diagnosis not present

## 2021-11-09 DIAGNOSIS — M9903 Segmental and somatic dysfunction of lumbar region: Secondary | ICD-10-CM | POA: Diagnosis not present

## 2021-11-09 NOTE — Patient Instructions (Signed)
Good to see you! No other big changes

## 2021-11-09 NOTE — Assessment & Plan Note (Addendum)
DDD noted, discussed HEP, chronic problem with exacerbation.  Patient has had gabapentin in the past but has not been taking it very frequently.  He does try to take more natural aspects.  Follow-up again in 6 to 8 weeks

## 2021-11-29 ENCOUNTER — Telehealth: Payer: Self-pay | Admitting: Neurology

## 2021-11-29 NOTE — Telephone Encounter (Signed)
I called pt. She reports worsening left leg pain that radiates from her hip to ankle. Sx have been present for 3+ weeks now and she would like to have a f/u appt to discuss.  Appt made for 12/02/2021 at 930 with Dr. Krista Blue.

## 2021-11-29 NOTE — Telephone Encounter (Signed)
Pt said having pain left leg; from hip to ankle for 3 or 4 weeks. Pain level is an 8.  Would like a call from the nurse

## 2021-12-01 ENCOUNTER — Encounter: Payer: Self-pay | Admitting: Family Medicine

## 2021-12-01 ENCOUNTER — Telehealth (INDEPENDENT_AMBULATORY_CARE_PROVIDER_SITE_OTHER): Payer: Medicare HMO | Admitting: Family Medicine

## 2021-12-01 DIAGNOSIS — U071 COVID-19: Secondary | ICD-10-CM | POA: Diagnosis not present

## 2021-12-01 DIAGNOSIS — R0981 Nasal congestion: Secondary | ICD-10-CM

## 2021-12-01 MED ORDER — NIRMATRELVIR/RITONAVIR (PAXLOVID)TABLET
3.0000 | ORAL_TABLET | Freq: Two times a day (BID) | ORAL | 0 refills | Status: AC
Start: 2021-12-01 — End: 2021-12-06

## 2021-12-01 NOTE — Progress Notes (Signed)
MyChart Video Visit    Virtual Visit via Video Note   This visit type was conducted due to national recommendations for restrictions regarding the COVID-19 Pandemic (e.g. social distancing) in an effort to limit this patient's exposure and mitigate transmission in our community. This patient is at least at moderate risk for complications without adequate follow up. This format is felt to be most appropriate for this patient at this time. Physical exam was limited by quality of the video and audio technology used for the visit. CMA was able to get the patient set up on a video visit.  Patient location: Home. Patient and provider in visit Provider location: Office  I discussed the limitations of evaluation and management by telemedicine and the availability of in person appointments. The patient expressed understanding and agreed to proceed.  Visit Date: 12/01/2021  Today's healthcare provider: Harland Dingwall, NP-C     Subjective:    Patient ID: Lindsay [name], female    DOB: [birth year], 71 y.o.   MRN: [id number]  Chief Complaint  Patient presents with   Covid Positive    Tested 8/30 Sx include: head congestion, chills, headache    HPI  Tested positive for Covid this morning.  Onset of symptoms this morning with chills, rhinorrhea, nasal congestion and frontal headache.   Denies fever, dizziness, chest pain, palpitations, shortness of breath, abdominal pain, N/V/D.  This is her first time having Covid.  Received 4 Covid vaccines.   No hx of lung disease   Past Medical History:  Diagnosis Date   COMMON MIGRAINE 09/20/2006   DEPRESSION 06/11/2009   GERD 06/11/2009   Hypertension 04/17/2018   Neck pain    eval with sports medicine in 2016/17   Osteopenia 04/17/2018    Past Surgical History:  Procedure Laterality Date   ABDOMINAL HYSTERECTOMY     BILATERAL OOPHORECTOMY     CHOLECYSTECTOMY     OVARIAN CYST REMOVAL     REPAIR EXTENSOR TENDON HAND     TONSILLECTOMY       Family History  Problem Relation Age of Onset   Dementia Mother    Osteoporosis Mother    Breast cancer Sister 46   Heart attack Brother    Colon cancer Neg Hx    Esophageal cancer Neg Hx    Rectal cancer Neg Hx    Stomach cancer Neg Hx     Social History   Socioeconomic History   Marital status: Married    Spouse name: Not on file   Number of children: Not on file   Years of education: Not on file   Highest education level: Not on file  Occupational History   Not on file  Tobacco Use   Smoking status: Former    Types: Cigarettes    Quit date: 04/04/2004    Years since quitting: 17.6   Smokeless tobacco: Never  Vaping Use   Vaping Use: Never used  Substance and Sexual Activity   Alcohol use: Yes    Alcohol/week: 0.0 standard drinks of alcohol    Comment: occ   Drug use: No   Sexual activity: Not Currently  Other Topics Concern   Not on file  Social History Narrative   Not on file   Social Determinants of Health   Financial Resource Strain: Low Risk  (09/16/2021)   Overall Financial Resource Strain (CARDIA)    Difficulty of Paying Living Expenses: Not hard at all  Food Insecurity: No Food Insecurity (09/16/2021)  Hunger Vital Sign    Worried About Running Out of Food in the Last Year: Never true    Ran Out of Food in the Last Year: Never true  Transportation Needs: No Transportation Needs (09/16/2021)   PRAPARE - Hydrologist (Medical): No    Lack of Transportation (Non-Medical): No  Physical Activity: Sufficiently Active (09/16/2021)   Exercise Vital Sign    Days of Exercise per Week: 3 days    Minutes of Exercise per Session: 60 min  Stress: No Stress Concern Present (09/16/2021)   [place]    Feeling of Stress : Not at all  Social Connections: Moderately Isolated (09/16/2021)   Social Connection and Isolation Panel [NHANES]    Frequency of Communication with  Friends and Family: More than three times a week    Frequency of Social Gatherings with Friends and Family: Three times a week    Attends Religious Services: Never    Active Member of Clubs or Organizations: No    Attends Archivist Meetings: Never    Marital Status: Married  Human resources officer Violence: Not At Risk (09/16/2021)   Humiliation, Afraid, Rape, and Kick questionnaire    Fear of Current or Ex-Partner: No    Emotionally Abused: No    Physically Abused: No    Sexually Abused: No    Outpatient Medications Prior to Visit  Medication Sig Dispense Refill   amLODipine (NORVASC) 5 MG tablet Take 1 tablet (5 mg total) by mouth daily. 90 tablet 3   buPROPion (WELLBUTRIN SR) 150 MG 12 hr tablet Take 1 tablet (150 mg total) by mouth 2 (two) times daily. 180 tablet 3   Cholecalciferol (VITAMIN D-3 PO) Take 1 capsule by mouth daily.     Cyanocobalamin (VITAMIN B-12 PO) Take by mouth.     diphenhydramine-acetaminophen (TYLENOL PM) 25-500 MG TABS Take 2 tablets by mouth at bedtime as needed (for sleep).      docusate sodium (COLACE) 100 MG capsule Take 200 mg by mouth at bedtime.     estradiol (ESTRACE) 0.1 MG/GM vaginal cream Place 0.5 g vaginally 2 (two) times a week. Place 0.5g nightly for two weeks then twice a week after 30 g 11   fish oil-omega-3 fatty acids 1000 MG capsule Take 1 g by mouth daily.     hydrocortisone (ANUSOL-HC) 2.5 % rectal cream Place 1 application rectally 2 (two) times daily. 30 g 0   Inositol Niacinate (NO FLUSH NIACIN) 250 MG TABS Take 250 mg by mouth in the morning.     MAGNESIUM PO Take 1 tablet by mouth at bedtime.      Misc Natural Products (TART CHERRY ADVANCED PO) Take 1 tablet by mouth at bedtime.      SUMAtriptan (IMITREX) 100 MG tablet TAKE 1/2-1 TABLET BY MOUTH AT ONSET MIGRAINE,MAY REPEAT ONCE 2HOURS IF NEED,MAX 2DOSES IN 24 HOURS 9 tablet 3   Turmeric (QC TUMERIC COMPLEX PO) Take by mouth.     vitamin E 180 MG (400 UNITS) capsule Take 400  Units by mouth daily.     No facility-administered medications prior to visit.    No Known Allergies  ROS     Objective:    Physical Exam  There were no vitals taken for this visit. Wt Readings from Last 3 Encounters:  11/09/21 127 lb (57.6 kg)  09/28/21 127 lb (57.6 kg)  09/07/21 129 lb (58.5 kg)   Alert and  oriented and in no acute distress.  Respirations unlabored.  Speaking in complete sentences without difficulty.     Assessment & Plan:   Problem List Items Addressed This Visit   None Visit Diagnoses     COVID-19 virus infection    -  Primary   Relevant Medications   nirmatrelvir/ritonavir EUA (PAXLOVID) 20 x 150 MG & 10 x '100MG'$  TABS   Nasal congestion          Onset of symptoms today and positive COVID test today as well.  She is not in any acute distress. Paxlovid prescribed.  Counseling on how to take medication and potential side effects.  Discussed symptomatic treatment.  Counseling on CDC guidelines for quarantine and isolation. Follow-up as needed.  I am having Zarin G. Moncus start on nirmatrelvir/ritonavir EUA. I am also having her maintain her fish oil-omega-3 fatty acids, diphenhydramine-acetaminophen, MAGNESIUM PO, Misc Natural Products (TART CHERRY ADVANCED PO), SUMAtriptan, Cholecalciferol (VITAMIN D-3 PO), docusate sodium, No Flush Niacin, estradiol, Turmeric (QC TUMERIC COMPLEX PO), Cyanocobalamin (VITAMIN B-12 PO), hydrocortisone, buPROPion, amLODipine, and vitamin E.  Meds ordered this encounter  Medications   nirmatrelvir/ritonavir EUA (PAXLOVID) 20 x 150 MG & 10 x '100MG'$  TABS    Sig: Take 3 tablets by mouth 2 (two) times daily for 5 days. (Take nirmatrelvir 150 mg two tablets twice daily for 5 days and ritonavir 100 mg one tablet twice daily for 5 days) Patient GFR is 69.28    Dispense:  30 tablet    Refill:  0    Order Specific Question:   Supervising Provider    Answer:   Pricilla Holm A [[id number]]    I discussed the assessment and  treatment plan with the patient. The patient was provided an opportunity to ask questions and all were answered. The patient agreed with the plan and demonstrated an understanding of the instructions.   The patient was advised to call back or seek an in-person evaluation if the symptoms worsen or if the condition fails to improve as anticipated.  I provided 15 minutes of face-to-face time during this encounter.   Harland Dingwall, NP-C [facility] [phone] (phone) [phone] (fax)  [place]

## 2021-12-02 ENCOUNTER — Ambulatory Visit: Payer: Medicare HMO | Admitting: Neurology

## 2021-12-03 ENCOUNTER — Encounter: Payer: Self-pay | Admitting: Internal Medicine

## 2021-12-06 ENCOUNTER — Other Ambulatory Visit: Payer: Self-pay | Admitting: Internal Medicine

## 2021-12-07 MED ORDER — HYDROCORTISONE (PERIANAL) 2.5 % EX CREA: 1.0000 | TOPICAL_CREAM | Freq: Two times a day (BID) | CUTANEOUS | 0 refills | Status: DC

## 2021-12-27 NOTE — Progress Notes (Unsigned)
  [place] Phone: [phone] Subjective:   Lindsay Lindsay [name], am serving as a scribe for Dr. Hulan Saas.  I'm seeing this patient by the request  of:  Binnie Rail, MD  CC: Neck pain and low back pain follow-up  HER:[id number]  Lindsay Lindsay [name] is a 71 y.o. female coming in with complaint of back and neck pain. OMT 11/09/2021. Patient states that her neck is stiff.   Medications patient has been prescribed: None  Taking:         Reviewed prior external information including notes and imaging from previsou exam, outside providers and external EMR if available.   As well as notes that were available from care everywhere and other healthcare systems.  Past medical history, social, surgical and family history all reviewed in electronic medical record.  Lindsay [name] pertanent information unless stated regarding to the chief complaint.   Past Medical History:  Diagnosis Date   COMMON MIGRAINE 09/20/2006   DEPRESSION 06/11/2009   GERD 06/11/2009   Hypertension 04/17/2018   Neck pain    eval with sports medicine in 2016/17   Osteopenia 04/17/2018    Lindsay [name] Known Allergies   Review of Systems:  Lindsay [name] headache, visual changes, nausea, vomiting, diarrhea, constipation, dizziness, abdominal pain, skin rash, fevers, chills, night sweats, weight loss, swollen lymph nodes, body aches, joint swelling, chest pain, shortness of breath, mood changes. POSITIVE muscle aches  Objective  Blood pressure 126/86, pulse 66, height '5\' 5"'$  (1.651 m), weight 126 lb (57.2 kg), SpO2 96 %.   General: Lindsay [name] apparent distress alert and oriented x3 mood and affect normal, dressed appropriately.  HEENT: Pupils equal, extraocular movements intact  Respiratory: Patient's speak in full sentences and does not appear short of breath  Cardiovascular: Lindsay [name] lower extremity edema, non tender, Lindsay [name] erythema  Neck exam does have significant loss of lordosis.  Tenderness to  palpation diffusely in the soft tissue bilaterally.  Patient does have tightness with sidebending bilaterally.  Osteopathic findings  C3 flexed rotated and side bent right C6 flexed rotated and side bent right T3 extended rotated and side bent right inhaled rib T9 extended rotated and side bent left L3 flexed rotated and side bent left Sacrum right on right     Assessment and Plan:  Degenerative cervical disc Degenerative disc disease.  Discussed icing regimen and home exercises, which activities to do and which ones to avoid.  Lindsay [name] other changes in medications.  Can always do another    Nonallopathic problems  Decision today to treat with OMT was based on Physical Exam  After verbal consent patient was treated with HVLA, ME, FPR techniques in cervical, rib, thoracic, lumbar, and sacral  areas  Patient tolerated the procedure well with improvement in symptoms  Patient given exercises, stretches and lifestyle modifications  See medications in patient instructions if given  Patient will follow up in 4-8 weeks      The above documentation has been reviewed and is accurate and complete Lindsay Pulley, DO        Note: This dictation was prepared with Dragon dictation along with smaller phrase technology. Any transcriptional errors that result from this process are unintentional.

## 2021-12-28 ENCOUNTER — Ambulatory Visit: Payer: Medicare HMO | Admitting: Family Medicine

## 2021-12-28 VITALS — BP 126/86 | HR 66 | Ht 65.0 in | Wt 126.0 lb

## 2021-12-28 DIAGNOSIS — M9902 Segmental and somatic dysfunction of thoracic region: Secondary | ICD-10-CM

## 2021-12-28 DIAGNOSIS — M9904 Segmental and somatic dysfunction of sacral region: Secondary | ICD-10-CM | POA: Diagnosis not present

## 2021-12-28 DIAGNOSIS — M9903 Segmental and somatic dysfunction of lumbar region: Secondary | ICD-10-CM

## 2021-12-28 DIAGNOSIS — M9901 Segmental and somatic dysfunction of cervical region: Secondary | ICD-10-CM

## 2021-12-28 DIAGNOSIS — M9908 Segmental and somatic dysfunction of rib cage: Secondary | ICD-10-CM

## 2021-12-28 DIAGNOSIS — M5030 Other cervical disc degeneration, unspecified cervical region: Secondary | ICD-10-CM | POA: Diagnosis not present

## 2021-12-28 NOTE — Assessment & Plan Note (Signed)
Degenerative disc disease.  Discussed icing regimen and home exercises, which activities to do and which ones to avoid.  No other changes in medications.  Can always do another

## 2021-12-28 NOTE — Patient Instructions (Signed)
Good to see you Show Lindsay [name]- Must take Mithra to [place] for brunch No other changes Continue exercises See me in 6 weeks

## 2022-01-04 ENCOUNTER — Encounter: Payer: Self-pay | Admitting: Neurology

## 2022-01-04 ENCOUNTER — Ambulatory Visit: Payer: Medicare HMO | Admitting: Neurology

## 2022-01-04 VITALS — BP 140/79 | HR 65 | Ht 65.0 in | Wt 126.0 lb

## 2022-01-04 DIAGNOSIS — R202 Paresthesia of skin: Secondary | ICD-10-CM | POA: Diagnosis not present

## 2022-01-04 NOTE — Progress Notes (Signed)
Chief Complaint  Patient presents with   Follow-up    Rm 13,  alone  C/o tingling in both foot, denies pain, worse when barefoot       ASSESSMENT AND PLAN  Lindsay [name] is a 71 y.o. female   Bilateral foot paresthesia, Chronic neck pain, history of right cervical radiculopathy  Brisk bilateral upper extremity patellar reflex,  Discussed  with patient, proceed with MRI of the spine to rule out cervical spondylitic myelopathy   DIAGNOSTIC DATA (LABS, IMAGING, TESTING) - I reviewed patient records, labs, notes, testing and imaging myself where available.   MEDICAL HISTORY:  Lindsay [name], is a 71 year old female, seen in request by her primary care physician Dr. Quay Burow, Claudina Lick, for evaluation of bilateral foot discomfort, initial evaluation was on September 07, 2021  I reviewed and summarized the referring note. PMHX. HTN Depression, Anxiety Chronic migraine Chronic neck pain, right cervical radicular pain, epidural injection in the past.  She is still very active taking care of her grandchildren without any difficulties, but around beginning of 2023, she noticed bilateral foot discomfort only noticed that when she is barefoot bearing weight, there is no difficulty walking, no persistent sensory loss, did not feel that way when she have her shoes on, she described her feet uncomfortable sensation if she walk on her barefoot, as if her feet has been wrapped in ACE bandage,  She denies significant low back pain, no bowel or bladder incontinence, She does have intermittent right hand paresthesia, with diagnosis of right carpal tunnel syndrome  UPDATE Jan 04 2022: She noticed spreading of numbness at the bottom of her feet, previously at toes and ball area, now spreading to the heel, she denies gait abnormality, taking care of her grandchildren, denies bowel or bladder incontinence  She does have chronic neck pain, history of radiating pain to right upper extremity in the  past,  PHYSICAL EXAM:   Vitals:   01/04/22 1508  BP: (!) 140/79  Pulse: 65  Weight: 126 lb (57.2 kg)  Height: '5\' 5"'$  (1.651 m)   Not recorded     Body mass index is 20.97 kg/m.  PHYSICAL EXAMNIATION:  Gen: NAD, conversant, well nourised, well groomed                     Cardiovascular: Regular rate rhythm, no peripheral edema, warm, nontender. Eyes: Conjunctivae clear without exudates or hemorrhage Neck: Supple, no carotid bruits. Pulmonary: Clear to auscultation bilaterally   NEUROLOGICAL EXAM:  MENTAL STATUS: Speech/cognition: Awake, alert, oriented to history taking and casual conversation CRANIAL NERVES: CN II: Visual fields are full to confrontation. Pupils are round equal and briskly reactive to light. CN III, IV, VI: extraocular movement are normal. No ptosis. CN V: Facial sensation is intact to light touch CN VII: Face is symmetric with normal eye closure  CN VIII: Hearing is normal to causal conversation. CN IX, X: Phonation is normal. CN XI: Head turning and shoulder shrug are intact  MOTOR: There is no pronator drift of out-stretched arms. Muscle bulk and tone are normal. Muscle strength is normal.  REFLEXES: Reflexes are 2+ and symmetric at the biceps, triceps, knees, and ankles. Plantar responses are flexor.  SENSORY: Intact to light touch, pinprick and vibratory sensation are intact in fingers and toes.  COORDINATION: There is no trunk or limb dysmetria noted.  GAIT/STANCE: Posture is normal. Gait is steady with normal steps,  Romberg is absent.  REVIEW OF SYSTEMS:  Full  14 system review of systems performed and notable only for as above All other review of systems were negative.   ALLERGIES: No Known Allergies  HOME MEDICATIONS: Current Outpatient Medications  Medication Sig Dispense Refill   amLODipine (NORVASC) 5 MG tablet Take 1 tablet (5 mg total) by mouth daily. 90 tablet 3   buPROPion (WELLBUTRIN SR) 150 MG 12 hr tablet Take 1  tablet (150 mg total) by mouth 2 (two) times daily. 180 tablet 3   Cholecalciferol (VITAMIN D-3 PO) Take 1 capsule by mouth daily.     Cyanocobalamin (VITAMIN B-12 PO) Take by mouth.     diphenhydramine-acetaminophen (TYLENOL PM) 25-500 MG TABS Take 2 tablets by mouth at bedtime as needed (for sleep).      docusate sodium (COLACE) 100 MG capsule Take 200 mg by mouth at bedtime.     estradiol (ESTRACE) 0.1 MG/GM vaginal cream Place 0.5 g vaginally 2 (two) times a week. Place 0.5g nightly for two weeks then twice a week after 30 g 11   fish oil-omega-3 fatty acids 1000 MG capsule Take 1 g by mouth daily.     hydrocortisone (ANUSOL-HC) 2.5 % rectal cream Place 1 Application rectally 2 (two) times daily. 30 g 0   Inositol Niacinate (NO FLUSH NIACIN) 250 MG TABS Take 250 mg by mouth in the morning.     MAGNESIUM PO Take 1 tablet by mouth at bedtime.      Misc Natural Products (TART CHERRY ADVANCED PO) Take 1 tablet by mouth at bedtime.      SUMAtriptan (IMITREX) 100 MG tablet TAKE 1/2-1 TABLET BY MOUTH AT ONSET MIGRAINE,MAY REPEAT ONCE 2HOURS IF NEED,MAX 2DOSES IN 24 HOURS 9 tablet 3   Turmeric (QC TUMERIC COMPLEX PO) Take by mouth.     vitamin E 180 MG (400 UNITS) capsule Take 400 Units by mouth daily.     No current facility-administered medications for this visit.    PAST MEDICAL HISTORY: Past Medical History:  Diagnosis Date   COMMON MIGRAINE 09/20/2006   DEPRESSION 06/11/2009   GERD 06/11/2009   Hypertension 04/17/2018   Neck pain    eval with sports medicine in 2016/17   Osteopenia 04/17/2018    PAST SURGICAL HISTORY: Past Surgical History:  Procedure Laterality Date   ABDOMINAL HYSTERECTOMY     BILATERAL OOPHORECTOMY     CHOLECYSTECTOMY     OVARIAN CYST REMOVAL     REPAIR EXTENSOR TENDON HAND     TONSILLECTOMY      FAMILY HISTORY: Family History  Problem Relation Age of Onset   Dementia Mother    Osteoporosis Mother    Breast cancer Sister 75   Heart attack Brother     Colon cancer Neg Hx    Esophageal cancer Neg Hx    Rectal cancer Neg Hx    Stomach cancer Neg Hx     SOCIAL HISTORY: Social History   Socioeconomic History   Marital status: Married    Spouse name: Not on file   Number of children: Not on file   Years of education: Not on file   Highest education level: Not on file  Occupational History   Not on file  Tobacco Use   Smoking status: Former    Types: Cigarettes    Quit date: 04/04/2004    Years since quitting: 17.7   Smokeless tobacco: Never  Vaping Use   Vaping Use: Never used  Substance and Sexual Activity   Alcohol use: Yes    Alcohol/week:  0.0 standard drinks of alcohol    Comment: occ   Drug use: No   Sexual activity: Not Currently  Other Topics Concern   Not on file  Social History Narrative   Not on file   Social Determinants of Health   Financial Resource Strain: Low Risk  (09/16/2021)   Overall Financial Resource Strain (CARDIA)    Difficulty of Paying Living Expenses: Not hard at all  Food Insecurity: No Food Insecurity (09/16/2021)   Hunger Vital Sign    Worried About Running Out of Food in the Last Year: Never true    Ran Out of Food in the Last Year: Never true  Transportation Needs: No Transportation Needs (09/16/2021)   PRAPARE - Hydrologist (Medical): No    Lack of Transportation (Non-Medical): No  Physical Activity: Sufficiently Active (09/16/2021)   Exercise Vital Sign    Days of Exercise per Week: 3 days    Minutes of Exercise per Session: 60 min  Stress: No Stress Concern Present (09/16/2021)   [place]    Feeling of Stress : Not at all  Social Connections: Moderately Isolated (09/16/2021)   Social Connection and Isolation Panel [NHANES]    Frequency of Communication with Friends and Family: More than three times a week    Frequency of Social Gatherings with Friends and Family: Three times a week     Attends Religious Services: Never    Active Member of Clubs or Organizations: No    Attends Archivist Meetings: Never    Marital Status: Married  Human resources officer Violence: Not At Risk (09/16/2021)   Humiliation, Afraid, Rape, and Kick questionnaire    Fear of Current or Ex-Partner: No    Emotionally Abused: No    Physically Abused: No    Sexually Abused: No      Marcial Pacas, M.D. Ph.D.  [facility] [address], [place], [place] [address]----------------------   ---------------------------------------------------------------------------------------- ---------------------- was Ph: [phone] Fax: [phone]  CC:  Binnie Rail, MD [place],  [place] [address]  Binnie Rail, MD

## 2022-01-10 ENCOUNTER — Ambulatory Visit: Payer: Medicare HMO | Admitting: Neurology

## 2022-01-20 ENCOUNTER — Ambulatory Visit
Admission: RE | Admit: 2022-01-20 | Discharge: 2022-01-20 | Disposition: A | Discharge status: Home / Self Care | Payer: Medicare HMO | Source: Ambulatory Visit | Attending: Neurology | Admitting: Neurology

## 2022-01-20 DIAGNOSIS — R202 Paresthesia of skin: Secondary | ICD-10-CM

## 2022-01-24 ENCOUNTER — Encounter: Payer: Self-pay | Admitting: *Deleted

## 2022-01-25 ENCOUNTER — Encounter: Payer: Self-pay | Admitting: Neurology

## 2022-01-27 ENCOUNTER — Telehealth: Payer: Self-pay | Admitting: Neurology

## 2022-01-27 ENCOUNTER — Telehealth: Payer: Self-pay | Admitting: *Deleted

## 2022-01-27 ENCOUNTER — Encounter: Payer: Self-pay | Admitting: Podiatry

## 2022-01-27 ENCOUNTER — Ambulatory Visit: Payer: Medicare HMO | Admitting: Podiatry

## 2022-01-27 ENCOUNTER — Other Ambulatory Visit: Payer: Self-pay | Admitting: Neurology

## 2022-01-27 DIAGNOSIS — L600 Ingrowing nail: Secondary | ICD-10-CM

## 2022-01-27 DIAGNOSIS — R202 Paresthesia of skin: Secondary | ICD-10-CM

## 2022-01-27 DIAGNOSIS — B351 Tinea unguium: Secondary | ICD-10-CM | POA: Diagnosis not present

## 2022-01-27 NOTE — Telephone Encounter (Signed)
I spoke with the patient. The purpose of the electromyoneurogram and nerve conduction study was explained to the patient. EMG is used to evaluate muscles and the nerves that control those muscles and NCS measures how well your nerves conduct electricity. I informed the patient that Dr. Krista Blue uses both MRI and EMG/NCV to localize the problem and etiology of neurological conditions. She verbalized understanding and expressed appreciation for the call.

## 2022-01-27 NOTE — Telephone Encounter (Signed)
Noted  

## 2022-01-27 NOTE — Telephone Encounter (Signed)
I have ordered EMG nerve conduction study for this patient, please make sure she is on schedule, not urgent

## 2022-01-27 NOTE — Telephone Encounter (Signed)
Pt would like a call explaining the purpose of the nerve conduction study and if this test would prevent her from having to do another MRI

## 2022-01-27 NOTE — Patient Instructions (Signed)

## 2022-01-27 NOTE — Telephone Encounter (Signed)
Patient is calling to ask if ok to take a shower tonight and does the procedural toe need to covered when showering? Please advise.

## 2022-01-27 NOTE — Telephone Encounter (Signed)
Ok to shower

## 2022-01-28 NOTE — Progress Notes (Signed)
Subjective:   Patient ID: Lindsay [name], female   DOB: 71 y.o.   MRN: [id number]   HPI Patient states she damaged her right hallux nail years ago and then traumatized it 2 months ago and its been fairly discolored and loose and she is concerned about regrowth and what the future may bring.  Patient does not smoke likes to be active   Review of Systems  All other systems reviewed and are negative.       Objective:  Physical Exam Vitals and nursing note reviewed.  Constitutional:      Appearance: She is well-developed.  Pulmonary:     Effort: Pulmonary effort is normal.  Musculoskeletal:        General: Normal range of motion.  Skin:    General: Skin is warm.  Neurological:     Mental Status: She is alert.     Neurovascular status intact muscle strength found to be adequate range of motion within normal limits.  Patient is noted to have a discolored thickened right hallux nail bed localized to this area it is mildly painful and it is loose I did not note any other pathology she does have several other nails which have mild discoloration     Assessment:  Trauma to the right hallux nail bed with lifting of the bed itself and mild mycotic infection of both feet     Plan:  H&P spent a great deal of time going over the different treatment options for this including doing nothing removing the nail permanent removal of the nail.  We reviewed these different options spent time discussing the pros and cons and she is opted for nail removal allowing a new nail to regrow.  I explained this procedure to the patient and that it may not regrow normally and ultimately may require permanent procedure and she wants the surgery and today I infiltrated the right hallux 60 mg Xylocaine Marcaine mixture sterile prep done and using sterile instrumentation remove the nail flush the area out and applied sterile dressing.  Patient will begin soaks reappoint as needed but will take about 6 months to grow  out

## 2022-01-28 NOTE — Telephone Encounter (Signed)
Patient has been updated that it is ok to take shower.

## 2022-02-08 NOTE — Progress Notes (Unsigned)
  Lindsay [name] Phone: [phone] Subjective:   Lindsay [name], am serving as a scribe for Dr. Hulan Saas.  I'm seeing this patient by the request  of:  Binnie Rail, MD  CC: back and neck pain   MAU:[id number]  Lindsay [name] is a 71 y.o. female coming in with complaint of back and neck pain. OMT 12/28/2021. Patient states that she has been under some stress and is tight.   Medications patient has been prescribed: None          Reviewed prior external information including notes and imaging from previsou exam, outside providers and external EMR if available.   As well as notes that were available from care everywhere and other healthcare systems.  Past medical history, social, surgical and family history all reviewed in electronic medical record.  [name] pertanent information unless stated regarding to the chief complaint.   Past Medical History:  Diagnosis Date   COMMON MIGRAINE 09/20/2006   DEPRESSION 06/11/2009   GERD 06/11/2009   Hypertension 04/17/2018   Neck pain    eval with sports medicine in 2016/17   Osteopenia 04/17/2018      Review of Systems:  [name] headache, visual changes, nausea, vomiting, diarrhea, constipation, dizziness, abdominal pain, skin rash, fevers, chills, night sweats, weight loss, swollen lymph nodes, body aches, joint swelling, chest pain, shortness of breath, mood changes. POSITIVE muscle aches  Objective  Blood pressure 112/72, pulse (!) 57, height '5\' 5"'$  (1.651 m), weight 125 lb (56.7 kg), SpO2 98 %.   General: [name] apparent distress alert and oriented x3 mood and affect normal, dressed appropriately.  HEENT: Pupils equal, extraocular movements intact  Respiratory: Patient's speak in full sentences and does not appear short of breath  Cardiovascular: [name] lower extremity edema, non tender, [name] erythema  MSK:  Back does have loss of lordosis does have tenderness to palpation in the  paraspinal musculature otherwise. Neck crepitus    Osteopathic findings  C2 flexed rotated and side bent right C6 flexed rotated and side bent left T5 extended rotated and side bent right inhaled rib T8 extended rotated and side bent left L3 flexed rotated and side bent right Sacrum right on right       Assessment and Plan:  Degenerative cervical disc Patient does have the arthritic changes of the neck.  Some limited sidebending bilaterally.  Discussed using the gabapentin on a more regular basis as well.  Patient does respond well to osteopathic manipulation.  Discussed which activities to do and which ones to avoid.  Increase activity slowly otherwise.  Follow-up again in 6 to 8 weeks.    Nonallopathic problems  Decision today to treat with OMT was based on Physical Exam  After verbal consent patient was treated with HVLA, ME, FPR techniques in cervical, rib, thoracic, lumbar, and sacral  areas  Patient tolerated the procedure well with improvement in symptoms  Patient given exercises, stretches and lifestyle modifications  See medications in patient instructions if given  Patient will follow up in 4-8 weeks    The above documentation has been reviewed and is accurate and complete Lindsay Pulley, DO          Note: This dictation was prepared with Dragon dictation along with smaller phrase technology. Any transcriptional errors that result from this process are unintentional.

## 2022-02-10 ENCOUNTER — Ambulatory Visit: Payer: Medicare HMO | Admitting: Neurology

## 2022-02-10 ENCOUNTER — Ambulatory Visit: Payer: Medicare HMO | Admitting: Family Medicine

## 2022-02-10 ENCOUNTER — Encounter: Payer: Self-pay | Admitting: Family Medicine

## 2022-02-10 VITALS — BP 112/72 | HR 57 | Ht 65.0 in | Wt 125.0 lb

## 2022-02-10 DIAGNOSIS — M9908 Segmental and somatic dysfunction of rib cage: Secondary | ICD-10-CM

## 2022-02-10 DIAGNOSIS — M9901 Segmental and somatic dysfunction of cervical region: Secondary | ICD-10-CM

## 2022-02-10 DIAGNOSIS — M9902 Segmental and somatic dysfunction of thoracic region: Secondary | ICD-10-CM

## 2022-02-10 DIAGNOSIS — M9903 Segmental and somatic dysfunction of lumbar region: Secondary | ICD-10-CM

## 2022-02-10 DIAGNOSIS — M9904 Segmental and somatic dysfunction of sacral region: Secondary | ICD-10-CM | POA: Diagnosis not present

## 2022-02-10 DIAGNOSIS — M5030 Other cervical disc degeneration, unspecified cervical region: Secondary | ICD-10-CM | POA: Diagnosis not present

## 2022-02-10 NOTE — Patient Instructions (Addendum)
Good to see you Have other people help you with dinner See me in 6-8 weeks

## 2022-02-10 NOTE — Assessment & Plan Note (Signed)
Patient does have the arthritic changes of the neck.  Some limited sidebending bilaterally.  Discussed using the gabapentin on a more regular basis as well.  Patient does respond well to osteopathic manipulation.  Discussed which activities to do and which ones to avoid.  Increase activity slowly otherwise.  Follow-up again in 6 to 8 weeks.

## 2022-02-11 ENCOUNTER — Encounter: Payer: Self-pay | Admitting: Family Medicine

## 2022-03-02 ENCOUNTER — Encounter: Payer: Self-pay | Admitting: Internal Medicine

## 2022-03-15 NOTE — Progress Notes (Unsigned)
  [name] [place] Phone: [phone] Subjective:   Lindsay [name], am serving as a scribe for Dr. Hulan Saas. I'm seeing this patient by the request  of:  Binnie Rail, MD  CC: Neck pain follow-up  LEX:[id number]  Lindsay [name] is a 71 y.o. female coming in with complaint of back and neck pain. OMT 02/10/2022. Patient states here for routine OMT. Neck especially.   Medications patient has been prescribed: None  Taking:         Reviewed prior external information including notes and imaging from previsou exam, outside providers and external EMR if available.   As well as notes that were available from care everywhere and other healthcare systems.  Past medical history, social, surgical and family history all reviewed in electronic medical record.  No pertanent information unless stated regarding to the chief complaint.   Past Medical History:  Diagnosis Date   COMMON MIGRAINE 09/20/2006   DEPRESSION 06/11/2009   GERD 06/11/2009   Hypertension 04/17/2018   Neck pain    eval with sports medicine in 2016/17   Osteopenia 04/17/2018    No Known Allergies   Review of Systems:  No headache, visual changes, nausea, vomiting, diarrhea, constipation, dizziness, abdominal pain, skin rash, fevers, chills, night sweats, weight loss, swollen lymph nodes, body aches, joint swelling, chest pain, shortness of breath, mood changes. POSITIVE muscle aches  Objective  Blood pressure 122/84, pulse 70, height '5\' 5"'$  (1.651 m), weight 126 lb (57.2 kg), SpO2 98 %.   General: No apparent distress alert and oriented x3 mood and affect normal, dressed appropriately.  HEENT: Pupils equal, extraocular movements intact  Respiratory: Patient's speak in full sentences and does not appear short of breath  Cardiovascular: No lower extremity edema, non tender, no erythema  Gait MSK:  Back   Osteopathic findings  C2 flexed rotated  and side bent right C6 flexed rotated and side bent left T3 extended rotated and side bent right inhaled rib T9 extended rotated and side bent left L2 flexed rotated and side bent right Sacrum right on right       Assessment and Plan:  Degenerative cervical disc Degenerative disc disease.  Discussed which activities to do and which ones to avoid.  Increase activity slowly.  Discussed icing regimen and home exercises.    Nonallopathic problems  Decision today to treat with OMT was based on Physical Exam  After verbal consent patient was treated with HVLA, ME, FPR techniques in cervical, rib, thoracic, lumbar, and sacral  areas  Patient tolerated the procedure well with improvement in symptoms  Patient given exercises, stretches and lifestyle modifications  See medications in patient instructions if given  Patient will follow up in 4-8 weeks    The above documentation has been reviewed and is accurate and complete Lindsay Pulley, DO          Note: This dictation was prepared with Dragon dictation along with smaller phrase technology. Any transcriptional errors that result from this process are unintentional.

## 2022-03-17 ENCOUNTER — Encounter: Payer: Self-pay | Admitting: Family Medicine

## 2022-03-17 ENCOUNTER — Ambulatory Visit: Payer: Medicare HMO | Admitting: Family Medicine

## 2022-03-17 VITALS — BP 122/84 | HR 70 | Ht 65.0 in | Wt 126.0 lb

## 2022-03-17 DIAGNOSIS — M9902 Segmental and somatic dysfunction of thoracic region: Secondary | ICD-10-CM | POA: Diagnosis not present

## 2022-03-17 DIAGNOSIS — M5030 Other cervical disc degeneration, unspecified cervical region: Secondary | ICD-10-CM

## 2022-03-17 DIAGNOSIS — M9908 Segmental and somatic dysfunction of rib cage: Secondary | ICD-10-CM | POA: Diagnosis not present

## 2022-03-17 DIAGNOSIS — M9901 Segmental and somatic dysfunction of cervical region: Secondary | ICD-10-CM | POA: Diagnosis not present

## 2022-03-17 DIAGNOSIS — M9903 Segmental and somatic dysfunction of lumbar region: Secondary | ICD-10-CM | POA: Diagnosis not present

## 2022-03-17 DIAGNOSIS — M9904 Segmental and somatic dysfunction of sacral region: Secondary | ICD-10-CM

## 2022-03-17 NOTE — Assessment & Plan Note (Addendum)
Degenerative disc disease.  Discussed which activities to do and which ones to avoid.  Increase activity slowly.  Discussed icing regimen and home exercises.  Discussed the gabapentin again at 200 mg at night if necessary.  Anti-inflammatories and 3-day burst.  Follow-up again in 6 to 8 weeks.

## 2022-03-22 ENCOUNTER — Encounter: Payer: Self-pay | Admitting: Internal Medicine

## 2022-03-25 ENCOUNTER — Encounter: Payer: Self-pay | Admitting: Family Medicine

## 2022-03-25 MED ORDER — DOXYCYCLINE HYCLATE 100 MG PO TABS: 100.0000 mg | ORAL_TABLET | Freq: Two times a day (BID) | ORAL | 0 refills | Status: AC

## 2022-04-20 NOTE — Progress Notes (Signed)
Chief Complaint  Patient presents with   Procedure    Rm EMG/NCV 3.       ASSESSMENT AND PLAN  Lindsay [name] is a 72 y.o. female   Bilateral foot paresthesia,  Brisk bilateral upper extremity patellar reflex,  Personally reviewed MRI cervical spine October 2023, mild degenerative changes, most prominent at C5-6, no significant canal foraminal narrowing  EMG nerve conduction study today showed mild axonal sensorimotor polyneuropathy, also evidence of carpal tunnel syndromes, left side is moderately severe, right side is mild.  She is going to have laboratory evaluation by her primary care physician, before to have laboratory done at her PCP, I have suggested CMP, CBC, A1c, B12, RPR, ANA, protein electrophoresis to look for treatable etiology for peripheral neuropathy,   Bilateral wrist splint for carpal tunnel syndromes,    DIAGNOSTIC DATA (LABS, IMAGING, TESTING) - I reviewed patient records, labs, notes, testing and imaging myself where available.   MEDICAL HISTORY:  Lindsay [name], is a 72 year old female, seen in request by her primary care physician Dr. Quay Burow, Claudina Lick, for evaluation of bilateral foot discomfort, initial evaluation was on September 07, 2021  I reviewed and summarized the referring note. PMHX. HTN Depression, Anxiety Chronic migraine Chronic neck pain, right cervical radicular pain, epidural injection in the past.  She is still very active taking care of her grandchildren without any difficulties, but around beginning of 2023, she noticed bilateral foot discomfort only noticed that when she is barefoot bearing weight, there is no difficulty walking, no persistent sensory loss, did not feel that way when she have her shoes on, she described her feet uncomfortable sensation if she walk on her barefoot, as if her feet has been wrapped in ACE bandage,  She denies significant low back pain, no bowel or bladder incontinence, She does have intermittent right hand  paresthesia, with diagnosis of right carpal tunnel syndrome  UPDATE Jan 04 2022: She noticed spreading of numbness at the bottom of her feet, previously at toes and ball area, now spreading to the heel, she denies gait abnormality, taking care of her grandchildren, denies bowel or bladder incontinence  She does have chronic neck pain, history of radiating pain to right upper extremity in the past,  UPDATE Apr 22 2022: She returns for electrodiagnostic study today, which showed evidence of mild axonal sensory motor polyneuropathy, also evidence of bilateral carpal tunnel syndromes, left side is moderately severe, right side is mild  She continue complains of noticeable bilateral feet paresthesia, but denied gait abnormality  I personally reviewed MRI of cervical spine in October 2023, multilevel degenerative changes, most noticeable at C5-6, with mild canal, moderate left greater than right foraminal narrowing  PHYSICAL EXAM:    Vitals:   04/22/22 0927  Weight: 123 lb 8 oz (56 kg)  Height: '5\' 5"'$  (1.651 m)     Body mass index is 20.55 kg/m.  PHYSICAL EXAMNIATION:  Gen: NAD, conversant, well nourised, well groomed                     Cardiovascular: Regular rate rhythm, no peripheral edema, warm, nontender. Eyes: Conjunctivae clear without exudates or hemorrhage Neck: Supple, no carotid bruits. Pulmonary: Clear to auscultation bilaterally   NEUROLOGICAL EXAM:  MENTAL STATUS: Speech/cognition: Awake, alert, oriented to history taking and casual conversation CRANIAL NERVES: CN II: Visual fields are full to confrontation. Pupils are round equal and briskly reactive to light. CN III, IV, VI: extraocular movement are normal. No  ptosis. CN V: Facial sensation is intact to light touch CN VII: Face is symmetric with normal eye closure  CN VIII: Hearing is normal to causal conversation. CN IX, X: Phonation is normal. CN XI: Head turning and shoulder shrug are intact  MOTOR: There  is no pronator drift of out-stretched arms. Muscle bulk and tone are normal. Muscle strength is normal.  REFLEXES: Reflexes are 2+ and symmetric at the biceps, triceps, knees, and ankles. Plantar responses are flexor.  SENSORY: Intact to light touch, pinprick and vibratory sensation are intact in fingers and toes.  COORDINATION: There is no trunk or limb dysmetria noted.  GAIT/STANCE: Posture is normal. Gait is steady with normal steps,  Romberg is absent.  REVIEW OF SYSTEMS:  Full 14 system review of systems performed and notable only for as above All other review of systems were negative.   ALLERGIES: No Known Allergies  HOME MEDICATIONS: Current Outpatient Medications  Medication Sig Dispense Refill   amLODipine (NORVASC) 5 MG tablet Take 1 tablet (5 mg total) by mouth daily. 90 tablet 3   buPROPion (WELLBUTRIN SR) 150 MG 12 hr tablet Take 1 tablet (150 mg total) by mouth 2 (two) times daily. 180 tablet 3   Cholecalciferol (VITAMIN D-3 PO) Take 1 capsule by mouth daily.     Cyanocobalamin (VITAMIN B-12 PO) Take by mouth.     diphenhydramine-acetaminophen (TYLENOL PM) 25-500 MG TABS Take 2 tablets by mouth at bedtime as needed (for sleep).      docusate sodium (COLACE) 100 MG capsule Take 200 mg by mouth at bedtime.     estradiol (ESTRACE) 0.1 MG/GM vaginal cream Place 0.5 g vaginally 2 (two) times a week. Place 0.5g nightly for two weeks then twice a week after 30 g 11   fish oil-omega-3 fatty acids 1000 MG capsule Take 1 g by mouth daily.     hydrocortisone (ANUSOL-HC) 2.5 % rectal cream Place 1 Application rectally 2 (two) times daily. 30 g 0   Inositol Niacinate (NO FLUSH NIACIN) 250 MG TABS Take 250 mg by mouth in the morning.     MAGNESIUM PO Take 1 tablet by mouth at bedtime.      Misc Natural Products (TART CHERRY ADVANCED PO) Take 1 tablet by mouth at bedtime.      SUMAtriptan (IMITREX) 100 MG tablet TAKE 1/2-1 TABLET BY MOUTH AT ONSET MIGRAINE,MAY REPEAT ONCE 2HOURS  IF NEED,MAX 2DOSES IN 24 HOURS 9 tablet 3   Turmeric (QC TUMERIC COMPLEX PO) Take by mouth.     vitamin E 180 MG (400 UNITS) capsule Take 400 Units by mouth daily.     No current facility-administered medications for this visit.    PAST MEDICAL HISTORY: Past Medical History:  Diagnosis Date   COMMON MIGRAINE 09/20/2006   DEPRESSION 06/11/2009   GERD 06/11/2009   Hypertension 04/17/2018   Neck pain    eval with sports medicine in 2016/17   Osteopenia 04/17/2018    PAST SURGICAL HISTORY: Past Surgical History:  Procedure Laterality Date   ABDOMINAL HYSTERECTOMY     BILATERAL OOPHORECTOMY     CHOLECYSTECTOMY     OVARIAN CYST REMOVAL     REPAIR EXTENSOR TENDON HAND     TONSILLECTOMY      FAMILY HISTORY: Family History  Problem Relation Age of Onset   Dementia Mother    Osteoporosis Mother    Breast cancer Sister 56   Heart attack Brother    Colon cancer Neg Hx    Esophageal  cancer Neg Hx    Rectal cancer Neg Hx    Stomach cancer Neg Hx     SOCIAL HISTORY: Social History   Socioeconomic History   Marital status: Married    Spouse name: Not on file   Number of children: Not on file   Years of education: Not on file   Highest education level: Not on file  Occupational History   Not on file  Tobacco Use   Smoking status: Former    Types: Cigarettes    Quit date: 04/04/2004    Years since quitting: 18.0   Smokeless tobacco: Never  Vaping Use   Vaping Use: Never used  Substance and Sexual Activity   Alcohol use: Yes    Alcohol/week: 0.0 standard drinks of alcohol    Comment: occ   Drug use: No   Sexual activity: Not Currently  Other Topics Concern   Not on file  Social History Narrative   Not on file   Social Determinants of Health   Financial Resource Strain: Low Risk  (09/16/2021)   Overall Financial Resource Strain (CARDIA)    Difficulty of Paying Living Expenses: Not hard at all  Food Insecurity: No Food Insecurity (09/16/2021)   Hunger Vital Sign     Worried About Running Out of Food in the Last Year: Never true    Ran Out of Food in the Last Year: Never true  Transportation Needs: No Transportation Needs (09/16/2021)   PRAPARE - Hydrologist (Medical): No    Lack of Transportation (Non-Medical): No  Physical Activity: Sufficiently Active (09/16/2021)   Exercise Vital Sign    Days of Exercise per Week: 3 days    Minutes of Exercise per Session: 60 min  Stress: No Stress Concern Present (09/16/2021)   [place]    Feeling of Stress : Not at all  Social Connections: Moderately Isolated (09/16/2021)   Social Connection and Isolation Panel [NHANES]    Frequency of Communication with Friends and Family: More than three times a week    Frequency of Social Gatherings with Friends and Family: Three times a week    Attends Religious Services: Never    Active Member of Clubs or Organizations: No    Attends Archivist Meetings: Never    Marital Status: Married  Human resources officer Violence: Not At Risk (09/16/2021)   Humiliation, Afraid, Rape, and Kick questionnaire    Fear of Current or Ex-Partner: No    Emotionally Abused: No    Physically Abused: No    Sexually Abused: No      Marcial Pacas, M.D. Ph.D.  [facility] [address], [place], [place] [address]- Ph: [phone] Fax: [phone]  CC:  Binnie Rail, MD [place],  [place] [address]  Binnie Rail, MD

## 2022-04-22 ENCOUNTER — Ambulatory Visit: Payer: Medicare HMO | Admitting: Neurology

## 2022-04-22 VITALS — BP 136/89 | HR 63 | Ht 65.0 in | Wt 123.5 lb

## 2022-04-22 DIAGNOSIS — G5603 Carpal tunnel syndrome, bilateral upper limbs: Secondary | ICD-10-CM | POA: Diagnosis not present

## 2022-04-22 DIAGNOSIS — R202 Paresthesia of skin: Secondary | ICD-10-CM

## 2022-04-22 NOTE — Procedures (Signed)
Full Name: Lindsay [name] Gender: Female MRN #: [id number] Date of Birth: June 01, 1950    Visit Date: 04/22/2022 08:21 Age: 72 Years Examining Physician: Dr. Marcial Pacas Referring Physician: Dr. Marcial Pacas Height: 5 feet 5 inch History: 72 year old female complains of bilateral foot paresthesia, chronic neck pain  Summary of the test: Nerve conduction study: Right sural, superficial peroneal sensory responses were normal.  Left sural, superficial peroneal sensory responses showed mildly decreased snap amplitude.  Bilateral tibial, peroneal to EDB motor responses were normal.  Bilateral ulnar sensory responses were normal.  Bilateral median sensory responses showed moderately prolonged peak latency with mildly decreased snap amplitude.  Left ulnar motor responses were normal.  Left median motor responses showed mildly prolonged distal latency, with moderately decreased CMAP amplitude.  Electromyography: Selected needle examination was performed at right lower extremity muscles.  There was no significant abnormality noted.  Conclusion: This is an abnormal study.  There is electrodiagnostic evidence of bilateral median neuropathy across the wrist, left side is moderately severe, right side is mild.  There is also evidence of mild axonal sensorimotor neuropathy.    ------------------------------- Marcial Pacas, M.D. PhD  [facility] [address] [place], [place] [address] Tel: [phone] Fax: [phone]  Verbal informed consent was obtained from the patient, patient was informed of potential risk of procedure, including bruising, bleeding, hematoma formation, infection, muscle weakness, muscle pain, numbness, among others.        [place]    Nerve / Sites Muscle Latency Ref. Amplitude Ref. Rel Amp Segments Distance Velocity Ref. Area    ms ms mV mV %  cm m/s m/s mVms  L Median - APB     Wrist APB 5.1 ?4.4 2.8 ?4.0 100 Wrist - APB 7   8.7     Upper arm  APB 9.0  2.9  103 Upper arm - Wrist 20.5 52 ?49 9.6  L Ulnar - ADM     Wrist ADM 2.7 ?3.3 9.6 ?6.0 100 Wrist - ADM 7   32.1     B.Elbow ADM 5.1  8.6  89.3 B.Elbow - Wrist 14 57 ?49 29.8     A.Elbow ADM 7.6  8.0  93.4 A.Elbow - B.Elbow 14.5 60 ?49 29.1  R Peroneal - EDB     Ankle EDB 6.0 ?6.5 2.0 ?2.0 100 Ankle - EDB 9   7.0     Fib head EDB 18.2  0.3  13.5 Fib head - Ankle 21 17 ?44 0.7     Pop fossa EDB 14.0  2.9  1059 Pop fossa - Fib head 16.5 39 ?44 10.5         Pop fossa - Ankle      L Peroneal - EDB     Ankle EDB 5.1 ?6.5 3.2 ?2.0 100 Ankle - EDB 9   10.2     Fib head EDB 10.9  2.5  77.2 Fib head - Ankle 26 45 ?44 8.6     Pop fossa EDB 13.7  2.8  111 Pop fossa - Fib head 12.5 45 ?44 10.3         Pop fossa - Ankle      R Tibial - AH     Ankle AH 5.6 ?5.8 14.6 ?4.0 100 Ankle - AH 9   35.8     Pop fossa AH 15.4  10.0  68.8 Pop fossa - Ankle 42 43 ?41 28.3  L Tibial - AH  Ankle AH 5.5 ?5.8 13.6 ?4.0 100 Ankle - AH 9   28.5     Pop fossa AH 15.8  9.8  72.2 Pop fossa - Ankle 44 43 ?41 26.2                 SNC    Nerve / Sites Rec. Site Peak Lat Ref.  Amp Ref. Segments Distance    ms ms V V  cm  R Sural - Ankle (Calf)     Calf Ankle 3.6 ?4.4 6 ?6 Calf - Ankle 14  L Sural - Ankle (Calf)     Calf Ankle 4.3 ?4.4 3 ?6 Calf - Ankle 14  R Superficial peroneal - Ankle     Lat leg Ankle 2.6 ?4.4 5 ?6 Lat leg - Ankle 14  L Superficial peroneal - Ankle     Lat leg Ankle 3.9 ?4.4 3 ?6 Lat leg - Ankle 14  L Median - Orthodromic (Dig II, Mid palm)     Dig II Wrist 4.2 ?3.4 5 ?10 Dig II - Wrist 13  R Median - Orthodromic (Dig II, Mid palm)     Dig II Wrist 4.3 ?3.4 4 ?10 Dig II - Wrist 13  L Ulnar - Orthodromic, (Dig V, Mid palm)     Dig V Wrist 2.8 ?3.1 8 ?5 Dig V - Wrist 11  R Ulnar - Orthodromic, (Dig V, Mid palm)     Dig V Wrist 2.7 ?3.1 6 ?5 Dig V - Wrist 62                     F  Wave    Nerve F Lat Ref.   ms ms  R Tibial - AH 53.6 ?56.0  L Tibial - AH 52.9 ?56.0  L Ulnar  - ADM 27.8 ?32.0           EMG Summary Table    Spontaneous MUAP Recruitment  Muscle IA Fib PSW Fasc Other Amp Dur. Poly Pattern  R. Tibialis anterior Normal None None None _______ Normal Normal Normal Normal  R. Tibialis posterior Normal None None None _______ Normal Normal Normal Normal  R. Peroneus longus Normal None None None _______ Normal Normal Normal Normal  R. Vastus lateralis Normal None None None _______ Normal Normal Normal Normal  R. Abductor pollicis brevis Normal None None None _______ Normal Normal Normal Reduced

## 2022-04-22 NOTE — Patient Instructions (Signed)
Lab evaluation for peripheral neuropathy/paresthesia:  CMP, CBC, A1C, B12, RPR, ANA, Protein electrophoresis

## 2022-04-24 ENCOUNTER — Encounter: Payer: Self-pay | Admitting: Neurology

## 2022-04-26 DIAGNOSIS — H3561 Retinal hemorrhage, right eye: Secondary | ICD-10-CM | POA: Diagnosis not present

## 2022-04-26 DIAGNOSIS — H2513 Age-related nuclear cataract, bilateral: Secondary | ICD-10-CM | POA: Diagnosis not present

## 2022-04-26 DIAGNOSIS — H524 Presbyopia: Secondary | ICD-10-CM | POA: Diagnosis not present

## 2022-04-26 DIAGNOSIS — H5202 Hypermetropia, left eye: Secondary | ICD-10-CM | POA: Diagnosis not present

## 2022-04-26 DIAGNOSIS — H52223 Regular astigmatism, bilateral: Secondary | ICD-10-CM | POA: Diagnosis not present

## 2022-04-26 DIAGNOSIS — H5211 Myopia, right eye: Secondary | ICD-10-CM | POA: Diagnosis not present

## 2022-04-27 NOTE — Progress Notes (Unsigned)
Lindsay [name] [place] Phone: [phone] Subjective:   Lindsay [name], am serving as a scribe for Dr. Hulan Saas.  I'm seeing this patient by the request  of:  Binnie Rail, MD  CC: Back and neck pain  [id number]  Lindsay [name] is a 72 y.o. female coming in with complaint of back and neck pain. OMT 03/17/2022. Patient states that she is having more cracking in her cspine. Had MRI of C spine in October. EMG of LE on Tuesday.  MRI of the cervical spine was independently visualized by me showing the patient does have degenerative disc disease throughout the neck with significant osteophyte formation and facet hypertrophy and areas.  Medications patient has been prescribed: None  Taking:         Reviewed prior external information including notes and imaging from previsou exam, outside providers and external EMR if available.   As well as notes that were available from care everywhere and other healthcare systems.  Past medical history, social, surgical and family history all reviewed in electronic medical record.  [name] pertanent information unless stated regarding to the chief complaint.   Past Medical History:  Diagnosis Date   COMMON MIGRAINE 09/20/2006   DEPRESSION 06/11/2009   GERD 06/11/2009   Hypertension 04/17/2018   Neck pain    eval with sports medicine in 2016/17   Osteopenia 04/17/2018    [name] Known Allergies   Review of Systems:  [name] headache, visual changes, nausea, vomiting, diarrhea, constipation, dizziness, abdominal pain, skin rash, fevers, chills, night sweats, weight loss, swollen lymph nodes, body aches, joint swelling, chest pain, shortness of breath, mood changes. POSITIVE muscle aches  Objective  Blood pressure 118/84, pulse 62, height '5\' 5"'$  (1.651 m), weight 124 lb (56.2 kg), SpO2 99 %.   General: [name] apparent distress alert and oriented x3 mood and affect normal, dressed appropriately.   HEENT: Pupils equal, extraocular movements intact  Respiratory: Patient's speak in full sentences and does not appear short of breath  Cardiovascular: [name] lower extremity edema, non tender, [name] erythema  MSK:  Back does have some loss of lordosis.  Neck exam has significant loss of lordosis.  Limited sidebending bilaterally.  More tightness noted on the right than the left.  Tightness still noted in the left parascapular area  Osteopathic findings  C3 flexed rotated and side bent right C6 flexed rotated and side bent right T3 extended rotated and side bent left inhaled rib T9 extended rotated and side bent left inhaled rib L2 flexed rotated and side bent right Sacrum right on right       Assessment and Plan:  Degenerative cervical disc Known degenerative disc disease with patient's most recent MRI showing some progression at the moment.  Discussed with patient about other treatment options.  Discussed potentially considering another epidural in the cervical area to see if that is causing any of the foot pain patient was given a B12 injection today to see if it could be potentially helpful as well.  Discussed with patient about icing regimen and home exercises otherwise.  Discussed gabapentin which patient declined and is not enjoying the medication side effects follow-up with me again 6 to 8 weeks    Nonallopathic problems  Decision today to treat with OMT was based on Physical Exam  After verbal consent patient was treated with HVLA, ME, FPR techniques in cervical, rib, thoracic, lumbar, and sacral  areas  Patient tolerated the  procedure well with improvement in symptoms  Patient given exercises, stretches and lifestyle modifications  See medications in patient instructions if given  Patient will follow up in 4-8 weeks     The above documentation has been reviewed and is accurate and complete Lindsay Pulley, DO         Note: This dictation was prepared with Dragon  dictation along with smaller phrase technology. Any transcriptional errors that result from this process are unintentional.

## 2022-04-28 ENCOUNTER — Ambulatory Visit: Payer: Medicare HMO | Admitting: Family Medicine

## 2022-04-28 VITALS — BP 118/84 | HR 62 | Ht 65.0 in | Wt 124.0 lb

## 2022-04-28 DIAGNOSIS — M9904 Segmental and somatic dysfunction of sacral region: Secondary | ICD-10-CM

## 2022-04-28 DIAGNOSIS — M9903 Segmental and somatic dysfunction of lumbar region: Secondary | ICD-10-CM

## 2022-04-28 DIAGNOSIS — M9902 Segmental and somatic dysfunction of thoracic region: Secondary | ICD-10-CM

## 2022-04-28 DIAGNOSIS — R202 Paresthesia of skin: Secondary | ICD-10-CM

## 2022-04-28 DIAGNOSIS — M5030 Other cervical disc degeneration, unspecified cervical region: Secondary | ICD-10-CM | POA: Diagnosis not present

## 2022-04-28 DIAGNOSIS — M9908 Segmental and somatic dysfunction of rib cage: Secondary | ICD-10-CM | POA: Diagnosis not present

## 2022-04-28 DIAGNOSIS — M9901 Segmental and somatic dysfunction of cervical region: Secondary | ICD-10-CM

## 2022-04-28 DIAGNOSIS — E538 Deficiency of other specified B group vitamins: Secondary | ICD-10-CM | POA: Diagnosis not present

## 2022-04-28 MED ORDER — CYANOCOBALAMIN 1000 MCG/ML IJ SOLN
1000.0000 ug | Freq: Once | INTRAMUSCULAR | Status: AC
  Administered 2022-04-28: 1000 ug via INTRAMUSCULAR

## 2022-04-28 NOTE — Patient Instructions (Signed)
B12 injection today See me again in 6 weeks

## 2022-04-28 NOTE — Assessment & Plan Note (Signed)
Known degenerative disc disease with patient's most recent MRI showing some progression at the moment.  Discussed with patient about other treatment options.  Discussed potentially considering another epidural in the cervical area to see if that is causing any of the foot pain patient was given a B12 injection today to see if it could be potentially helpful as well.  Discussed with patient about icing regimen and home exercises otherwise.  Discussed gabapentin which patient declined and is not enjoying the medication side effects follow-up with me again 6 to 8 weeks

## 2022-04-28 NOTE — Assessment & Plan Note (Signed)
Seen Neurology wants to wait for her labs from primary care.  B12 injection given today to see if this helps

## 2022-06-08 NOTE — Progress Notes (Unsigned)
[place] Phone: [phone] Subjective:   [name], am serving as a scribe for Dr. Hulan Saas.  I'm seeing this patient by the request  of:  Binnie Rail, MD  CC: Back and neck pain follow-up  [id number]  TIFFINEE EDENS is a 72 y.o. female coming in with complaint of back and neck pain. OMT 04/28/2022. Patient states that her R scapula has tightness and tingling since last visit.  Nothing new but does feel that the original pain seems to be worsening minorly.  Patient does state that he is having worsening pain with paresthesia at the moment.  Medications patient has been prescribed: None  Taking:         Reviewed prior external information including notes and imaging from previsou exam, outside providers and external EMR if available.   As well as notes that were available from care everywhere and other healthcare systems.  Past medical history, social, surgical and family history all reviewed in electronic medical record.  No pertanent information unless stated regarding to the chief complaint.   Past Medical History:  Diagnosis Date   COMMON MIGRAINE 09/20/2006   DEPRESSION 06/11/2009   GERD 06/11/2009   Hypertension 04/17/2018   Neck pain    eval with sports medicine in 2016/17   Osteopenia 04/17/2018    No Known Allergies   Review of Systems:  No headache, visual changes, nausea, vomiting, diarrhea, constipation, dizziness, abdominal pain, skin rash, fevers, chills, night sweats, weight loss, swollen lymph nodes, joint swelling, chest pain, shortness of breath, mood changes. POSITIVE muscle aches, body aches  Objective  Blood pressure 110/88, pulse (!) 59, height '5\' 5"'$  (1.651 m), weight 123 lb (55.8 kg), SpO2 99 %.   General: No apparent distress alert and oriented x3 mood and affect normal, dressed appropriately.  HEENT: Pupils equal, extraocular movements intact  Respiratory:  Patient's speak in full sentences and does not appear short of breath  Cardiovascular: No lower extremity edema, non tender, no erythema  Neck exam does have significant loss of lordosis.  Some mild crepitus with range of motion testing.  Tightness in the shoulder blade right greater than left.  Mild crepitus with range of motion.  Some scapular dyskinesis noted.  Some increase in kyphosis of the thoracic spine  Osteopathic findings  C2 flexed rotated and side bent right C7 flexed rotated and side bent right T3 extended rotated and side bent right inhaled rib T11 extended rotated and side bent right L1 flexed rotated and side bent right Sacrum right on right       Assessment and Plan:  Degenerative cervical disc Chronic problem with worsening symptoms, there is some signs of axonal polyneuropathy that could be contributing as well.  We did review patient's nerve conduction study.  Patient also still has signs and symptoms consistent with the carpal tunnel even with the postsurgical side.  We discussed the gabapentin again, anti-inflammatories, epidurals if needed for the neck.  Follow-up again in 6 to 8 weeks    Nonallopathic problems  Decision today to treat with OMT was based on Physical Exam  After verbal consent patient was treated with HVLA, ME, FPR techniques in cervical, rib, thoracic, lumbar, and sacral  areas  Patient tolerated the procedure well with improvement in symptoms  Patient given exercises, stretches and lifestyle modifications  See medications in patient instructions if given  Patient will follow up in 4-8 weeks  The above documentation has been reviewed and is accurate and complete [name], DO          Note: This dictation was prepared with Dragon dictation along with smaller phrase technology. Any transcriptional errors that result from this process are unintentional.

## 2022-06-09 ENCOUNTER — Ambulatory Visit: Payer: Medicare HMO | Admitting: Family Medicine

## 2022-06-09 ENCOUNTER — Encounter: Payer: Self-pay | Admitting: Family Medicine

## 2022-06-09 VITALS — BP 110/88 | HR 59 | Ht 65.0 in | Wt 123.0 lb

## 2022-06-09 DIAGNOSIS — M9901 Segmental and somatic dysfunction of cervical region: Secondary | ICD-10-CM | POA: Diagnosis not present

## 2022-06-09 DIAGNOSIS — M9904 Segmental and somatic dysfunction of sacral region: Secondary | ICD-10-CM

## 2022-06-09 DIAGNOSIS — M9908 Segmental and somatic dysfunction of rib cage: Secondary | ICD-10-CM | POA: Diagnosis not present

## 2022-06-09 DIAGNOSIS — M9903 Segmental and somatic dysfunction of lumbar region: Secondary | ICD-10-CM | POA: Diagnosis not present

## 2022-06-09 DIAGNOSIS — M5030 Other cervical disc degeneration, unspecified cervical region: Secondary | ICD-10-CM | POA: Diagnosis not present

## 2022-06-09 DIAGNOSIS — M9902 Segmental and somatic dysfunction of thoracic region: Secondary | ICD-10-CM | POA: Diagnosis not present

## 2022-06-09 NOTE — Assessment & Plan Note (Signed)
Chronic problem with worsening symptoms, there is some signs of axonal polyneuropathy that could be contributing as well.  We did review patient's nerve conduction study.  Patient also still has signs and symptoms consistent with the carpal tunnel even with the postsurgical side.  We discussed the gabapentin again, anti-inflammatories, epidurals if needed for the neck.  Follow-up again in 6 to 8 weeks

## 2022-06-09 NOTE — Patient Instructions (Signed)
Say hi to silver fox Keep being active Wart remover cream with bandaid daily Pumice to toes after for one week See me in 6-8 weeks

## 2022-06-28 ENCOUNTER — Ambulatory Visit: Payer: Medicare HMO | Admitting: Physician Assistant

## 2022-06-28 DIAGNOSIS — D485 Neoplasm of uncertain behavior of skin: Secondary | ICD-10-CM | POA: Diagnosis not present

## 2022-06-28 DIAGNOSIS — D1722 Benign lipomatous neoplasm of skin and subcutaneous tissue of left arm: Secondary | ICD-10-CM | POA: Diagnosis not present

## 2022-06-28 DIAGNOSIS — L821 Other seborrheic keratosis: Secondary | ICD-10-CM | POA: Diagnosis not present

## 2022-06-28 DIAGNOSIS — D225 Melanocytic nevi of trunk: Secondary | ICD-10-CM | POA: Diagnosis not present

## 2022-06-28 DIAGNOSIS — K130 Diseases of lips: Secondary | ICD-10-CM | POA: Diagnosis not present

## 2022-06-30 ENCOUNTER — Other Ambulatory Visit: Payer: Self-pay

## 2022-06-30 ENCOUNTER — Encounter: Payer: Self-pay | Admitting: Internal Medicine

## 2022-06-30 DIAGNOSIS — F418 Other specified anxiety disorders: Secondary | ICD-10-CM

## 2022-06-30 MED ORDER — BUPROPION HCL ER (SR) 150 MG PO TB12: 150.0000 mg | ORAL_TABLET | Freq: Two times a day (BID) | ORAL | 0 refills | Status: DC

## 2022-06-30 MED ORDER — HYDROCORTISONE (PERIANAL) 2.5 % EX CREA: 1.0000 | TOPICAL_CREAM | Freq: Two times a day (BID) | CUTANEOUS | 0 refills | Status: DC

## 2022-07-20 ENCOUNTER — Other Ambulatory Visit: Payer: Self-pay | Admitting: Internal Medicine

## 2022-07-20 DIAGNOSIS — Z1231 Encounter for screening mammogram for malignant neoplasm of breast: Secondary | ICD-10-CM

## 2022-07-23 ENCOUNTER — Other Ambulatory Visit: Payer: Self-pay | Admitting: Internal Medicine

## 2022-07-23 DIAGNOSIS — F418 Other specified anxiety disorders: Secondary | ICD-10-CM

## 2022-07-27 DIAGNOSIS — R7303 Prediabetes: Secondary | ICD-10-CM | POA: Insufficient documentation

## 2022-07-27 DIAGNOSIS — R739 Hyperglycemia, unspecified: Secondary | ICD-10-CM | POA: Insufficient documentation

## 2022-07-27 NOTE — Progress Notes (Unsigned)
Subjective:    Patient ID: Lindsay [name], female    DOB: January 21, 1951, 72 y.o.   MRN: [id number]      HPI Lindsay [name] is here for a Physical exam and her chronic medical problems.   Saw Dr Lindsay [name] for her feet - blood work requested for further eval.    Some dysphagia - occ discomfort in lower chest- epigastric region.  H/o dilation in past.    R cervical anterior upper LN larger than L   Medications and allergies reviewed with patient and updated if appropriate.  Current Outpatient Medications on File Prior to Visit  Medication Sig Dispense Refill   amLODipine (NORVASC) 5 MG tablet Take 1 tablet (5 mg total) by mouth daily. 90 tablet 3   buPROPion (WELLBUTRIN SR) 150 MG 12 hr tablet TAKE 1 TABLET BY MOUTH TWICE A DAY 180 tablet 1   Cholecalciferol (VITAMIN D-3 PO) Take 1 capsule by mouth daily.     Cyanocobalamin (VITAMIN B-12 PO) Take by mouth.     diphenhydramine-acetaminophen (TYLENOL PM) 25-500 MG TABS Take 2 tablets by mouth at bedtime as needed (for sleep).      docusate sodium (COLACE) 100 MG capsule Take 200 mg by mouth at bedtime.     estradiol (ESTRACE) 0.1 MG/GM vaginal cream Place 0.5 g vaginally 2 (two) times a week. Place 0.5g nightly for two weeks then twice a week after 30 g 11   famotidine (PEPCID) 20 MG tablet Take 20 mg by mouth 2 (two) times daily.     fish oil-omega-3 fatty acids 1000 MG capsule Take 1 g by mouth daily.     Inositol Niacinate (NO FLUSH NIACIN) 250 MG TABS Take 250 mg by mouth in the morning.     MAGNESIUM PO Take 1 tablet by mouth at bedtime.      Misc Natural Products (TART CHERRY ADVANCED PO) Take 1 tablet by mouth at bedtime.      SUMAtriptan (IMITREX) 100 MG tablet TAKE 1/2-1 TABLET BY MOUTH AT ONSET MIGRAINE,MAY REPEAT ONCE 2HOURS IF NEED,MAX 2DOSES IN 24 HOURS 9 tablet 3   Turmeric (QC TUMERIC COMPLEX PO) Take by mouth.     vitamin E 180 MG (400 UNITS) capsule Take 400 Units by mouth daily.     No current facility-administered medications on  file prior to visit.    Review of Systems  Constitutional:  Negative for fever.  HENT:  Positive for trouble swallowing (food gets stuck).   Eyes:  Negative for visual disturbance.       L eye ball seems to be more prominent than R  Respiratory:  Positive for cough (? from GERD). Negative for shortness of breath and wheezing.   Cardiovascular:  Negative for chest pain, palpitations and leg swelling.  Gastrointestinal:  Positive for constipation (not complete BMs at times and some bloating). Negative for abdominal pain, blood in stool and diarrhea.       No gerd  Genitourinary:  Negative for dysuria.  Musculoskeletal:  Positive for arthralgias and back pain.  Skin:  Negative for rash.  Neurological:  Positive for headaches (occ). Negative for light-headedness.  Psychiatric/Behavioral:  Negative for dysphoric mood. The patient is not nervous/anxious.        Objective:   Vitals:   07/28/22 0828  BP: 110/78  Pulse: 60  Temp: 98.2 F (36.8 C)  SpO2: 98%   Filed Weights   07/28/22 0828  Weight: 123 lb 6.4 oz (56 kg)   Body mass  index is 20.53 kg/m.  BP Readings from Last 3 Encounters:  07/28/22 110/78  06/09/22 110/88  04/28/22 118/84    Wt Readings from Last 3 Encounters:  07/28/22 123 lb 6.4 oz (56 kg)  06/09/22 123 lb (55.8 kg)  04/28/22 124 lb (56.2 kg)       Physical Exam Constitutional: She appears well-developed and well-nourished. No distress.  HENT:  Head: Normocephalic and atraumatic.  Right Ear: External ear normal. Normal ear canal and TM Left Ear: External ear normal.  Normal ear canal and TM Mouth/Throat: Oropharynx is clear and moist.  Eyes: Conjunctivae normal.  Neck: Neck supple. No tracheal deviation present. No thyromegaly present.  No carotid bruit  Cardiovascular: Normal rate, regular rhythm and normal heart sounds.   No murmur heard.  No edema. Pulmonary/Chest: Effort normal and breath sounds normal. No respiratory distress. She has no  wheezes. She has no rales.  Breast: deferred   Abdominal: Soft. She exhibits no distension. There is no tenderness.  Lymphadenopathy: She has b/l cervical adenopathy - upper chain there is one nontender LN palpable b/l - no other cervical LN palpable.  No occipital, supraclavicular, submental or auricular LN palpable Skin: Skin is warm and dry. She is not diaphoretic.  Psychiatric: She has a normal mood and affect. Her behavior is normal.     Lab Results  Component Value Date   WBC 4.7 07/13/2021   HGB 14.3 07/13/2021   HCT 41.9 07/13/2021   PLT 258.0 07/13/2021   GLUCOSE 92 07/13/2021   CHOL 182 07/13/2021   TRIG 58.0 07/13/2021   HDL 75.90 07/13/2021   LDLDIRECT 125.5 12/28/2011   LDLCALC 95 07/13/2021   ALT 25 07/13/2021   AST 27 07/13/2021   NA 141 07/13/2021   K 3.8 07/13/2021   CL 105 07/13/2021   CREATININE 0.86 07/13/2021   BUN 14 07/13/2021   CO2 28 07/13/2021   TSH 1.57 07/13/2021   HGBA1C 5.6 08/15/2017         Assessment & Plan:   Physical exam: Screening blood work  ordered Exercise  walking Weight  normal Substance abuse  none   Reviewed recommended immunizations.   Health Maintenance  Topic Date Due   Medicare Annual Wellness (AWV)  09/17/2022   COVID-19 Vaccine (5 - 2023-24 season) 08/13/2022 (Originally 12/03/2021)   INFLUENZA VACCINE  11/03/2022   DEXA SCAN  12/17/2022   MAMMOGRAM  08/25/2023   COLONOSCOPY (Pts 45-7yrs Insurance coverage will need to be confirmed)  08/13/2024   DTaP/Tdap/Td (4 - Td or Tdap) 08/16/2027   Pneumonia Vaccine 4+ Years old  Completed   Hepatitis C Screening  Completed   Zoster Vaccines- Shingrix  Completed   HPV VACCINES  Aged Out          See Problem List for Assessment and Plan of chronic medical problems.

## 2022-07-27 NOTE — Patient Instructions (Addendum)
Blood work was ordered.   The lab is on the first floor.    Medications changes include :   none    A referral was ordered for GI .     Someone will call you to schedule an appointment.   A Ct coronary calcium score test was ordered.   Return in about 1 year (around 07/28/2023) for Physical Exam.    Health Maintenance, Female Adopting a healthy lifestyle and getting preventive care are important in promoting health and wellness. Ask your health care provider about: The right schedule for you to have regular tests and exams. Things you can do on your own to prevent diseases and keep yourself healthy. What should I know about diet, weight, and exercise? Eat a healthy diet  Eat a diet that includes plenty of vegetables, fruits, low-fat dairy products, and lean protein. Do not eat a lot of foods that are high in solid fats, added sugars, or sodium. Maintain a healthy weight Body mass index (BMI) is used to identify weight problems. It estimates body fat based on height and weight. Your health care provider can help determine your BMI and help you achieve or maintain a healthy weight. Get regular exercise Get regular exercise. This is one of the most important things you can do for your health. Most adults should: Exercise for at least 150 minutes each week. The exercise should increase your heart rate and make you sweat (moderate-intensity exercise). Do strengthening exercises at least twice a week. This is in addition to the moderate-intensity exercise. Spend less time sitting. Even light physical activity can be beneficial. Watch cholesterol and blood lipids Have your blood tested for lipids and cholesterol at 70 years of age, then have this test every 5 years. Have your cholesterol levels checked more often if: Your lipid or cholesterol levels are high. You are older than 70 years of age. You are at high risk for heart disease. What should I know about cancer  screening? Depending on your health history and family history, you may need to have cancer screening at various ages. This may include screening for: Breast cancer. Cervical cancer. Colorectal cancer. Skin cancer. Lung cancer. What should I know about heart disease, diabetes, and high blood pressure? Blood pressure and heart disease High blood pressure causes heart disease and increases the risk of stroke. This is more likely to develop in people who have high blood pressure readings or are overweight. Have your blood pressure checked: Every 3-5 years if you are 81-74 years of age. Every year if you are 70 years old or older. Diabetes Have regular diabetes screenings. This checks your fasting blood sugar level. Have the screening done: Once every three years after age 34 if you are at a normal weight and have a low risk for diabetes. More often and at a younger age if you are overweight or have a high risk for diabetes. What should I know about preventing infection? Hepatitis B If you have a higher risk for hepatitis B, you should be screened for this virus. Talk with your health care provider to find out if you are at risk for hepatitis B infection. Hepatitis C Testing is recommended for: Everyone born from [birth year] through 1963. Anyone with known risk factors for hepatitis C. Sexually transmitted infections (STIs) Get screened for STIs, including gonorrhea and chlamydia, if: You are sexually active and are younger than 70 years of age. You are older than 70 years of age  and your health care provider tells you that you are at risk for this type of infection. Your sexual activity has changed since you were last screened, and you are at increased risk for chlamydia or gonorrhea. Ask your health care provider if you are at risk. Ask your health care provider about whether you are at high risk for HIV. Your health care provider may recommend a prescription medicine to help prevent HIV  infection. If you choose to take medicine to prevent HIV, you should first get tested for HIV. You should then be tested every 3 months for as long as you are taking the medicine. Pregnancy If you are about to stop having your period (premenopausal) and you may become pregnant, seek counseling before you get pregnant. Take 400 to 800 micrograms (mcg) of folic acid every day if you become pregnant. Ask for birth control (contraception) if you want to prevent pregnancy. Osteoporosis and menopause Osteoporosis is a disease in which the bones lose minerals and strength with aging. This can result in bone fractures. If you are 72 years old or older, or if you are at risk for osteoporosis and fractures, ask your health care provider if you should: Be screened for bone loss. Take a calcium or vitamin D supplement to lower your risk of fractures. Be given hormone replacement therapy (HRT) to treat symptoms of menopause. Follow these instructions at home: Alcohol use Do not drink alcohol if: Your health care provider tells you not to drink. You are pregnant, may be pregnant, or are planning to become pregnant. If you drink alcohol: Limit how much you have to: 0-1 drink a day. Know how much alcohol is in your drink. In the [place] one drink equals one 12 oz bottle of beer (355 mL), one 5 oz glass of wine (148 mL), or one 1 oz glass of hard liquor (44 mL). Lifestyle Do not use any products that contain nicotine or tobacco. These products include cigarettes, chewing tobacco, and vaping devices, such as e-cigarettes. If you need help quitting, ask your health care provider. Do not use street drugs. Do not share needles. Ask your health care provider for help if you need support or information about quitting drugs. General instructions Schedule regular health, dental, and eye exams. Stay current with your vaccines. Tell your health care provider if: You often feel depressed. You have ever been abused  or do not feel safe at home. Summary Adopting a healthy lifestyle and getting preventive care are important in promoting health and wellness. Follow your health care provider's instructions about healthy diet, exercising, and getting tested or screened for diseases. Follow your health care provider's instructions on monitoring your cholesterol and blood pressure. This information is not intended to replace advice given to you by your health care provider. Make sure you discuss any questions you have with your health care provider. Document Revised: 08/10/2020 Document Reviewed: 08/10/2020 Elsevier Patient Education  [place].

## 2022-07-27 NOTE — Progress Notes (Unsigned)
  [name] [address] [place] [address] Phone: [phone] Subjective:   Bruce Donath, am serving as a scribe for Dr. Antoine Primas.  I'm seeing this patient by the request  of:  Pincus Sanes, MD  CC: Neck pain follow-up  [id number]  Lindsay [name] is a 72 y.o. female coming in with complaint of back and neck pain. OMT 06/28/2021. Patient states nothing that stops her from activity but does have some discomfort and pain noted.  Patient describes it as a dull, throbbing aching sensation.  Medications patient has been prescribed: None         Reviewed prior external information including notes and imaging from previsou exam, outside providers and external EMR if available.   As well as notes that were available from care everywhere and other healthcare systems.  Past medical history, social, surgical and family history all reviewed in electronic medical record.  No pertanent information unless stated regarding to the chief complaint.   Past Medical History:  Diagnosis Date   COMMON MIGRAINE 09/20/2006   DEPRESSION 06/11/2009   GERD 06/11/2009   Hypertension 04/17/2018   Neck pain    eval with sports medicine in 2016/17   Osteopenia 04/17/2018    No Known Allergies   Review of Systems:  No headache, visual changes, nausea, vomiting, diarrhea, constipation, dizziness, abdominal pain, skin rash, fevers, chills, night sweats, weight loss, swollen lymph nodes, body aches, joint swelling, chest pain, shortness of breath, mood changes. POSITIVE muscle aches  Objective  Blood pressure (!) 128/92, height  (1.651 m), weight 123 lb (55.8 kg).   General: No apparent distress alert and oriented x3 mood and affect normal, dressed appropriately.  HEENT: Pupils equal, extraocular movements intact  Respiratory: Patient's speak in full sentences and does not appear short of breath  Cardiovascular: No lower extremity edema, non tender, no  erythema  Low back exam does have some loss lordosis noted.  Neck exam does have significant loss of lordosis.  Patient does have some limited sidebending bilaterally.   Osteopathic findings  C2 flexed rotated and side bent right C6 flexed rotated and side bent left T4 extended rotated and side bent right inhaled rib T6 extended rotated and side bent left L2 flexed rotated and side bent right Sacrum right on right       Assessment and Plan:  Degenerative cervical disc Degenerative disc disease noted.  Discussed icing regimen and home exercises, which activities to do and which ones to avoid.  Increase activity slowly.  Follow-up again in 6 to 8 weeks otherwise.      Nonallopathic problems  Decision today to treat with OMT was based on Physical Exam  After verbal consent patient was treated with HVLA, ME, FPR techniques in cervical, rib, thoracic, lumbar, and sacral  areas  Patient tolerated the procedure well with improvement in symptoms  Patient given exercises, stretches and lifestyle modifications  See medications in patient instructions if given  Patient will follow up in 4-8 weeks    The above documentation has been reviewed and is accurate and complete Judi Saa, DO          Note: This dictation was prepared with Dragon dictation along with smaller phrase technology. Any transcriptional errors that result from this process are unintentional.

## 2022-07-28 ENCOUNTER — Ambulatory Visit: Payer: Medicare HMO | Admitting: Family Medicine

## 2022-07-28 ENCOUNTER — Encounter: Payer: Self-pay | Admitting: Internal Medicine

## 2022-07-28 ENCOUNTER — Encounter: Payer: Self-pay | Admitting: Family Medicine

## 2022-07-28 ENCOUNTER — Ambulatory Visit (INDEPENDENT_AMBULATORY_CARE_PROVIDER_SITE_OTHER): Payer: Medicare HMO | Admitting: Internal Medicine

## 2022-07-28 VITALS — BP 110/78 | HR 60 | Temp 98.2°F | Ht 65.0 in | Wt 123.4 lb

## 2022-07-28 VITALS — BP 128/92 | Ht 65.0 in | Wt 123.0 lb

## 2022-07-28 DIAGNOSIS — M9903 Segmental and somatic dysfunction of lumbar region: Secondary | ICD-10-CM

## 2022-07-28 DIAGNOSIS — R739 Hyperglycemia, unspecified: Secondary | ICD-10-CM

## 2022-07-28 DIAGNOSIS — M85851 Other specified disorders of bone density and structure, right thigh: Secondary | ICD-10-CM

## 2022-07-28 DIAGNOSIS — I10 Essential (primary) hypertension: Secondary | ICD-10-CM | POA: Diagnosis not present

## 2022-07-28 DIAGNOSIS — F418 Other specified anxiety disorders: Secondary | ICD-10-CM | POA: Diagnosis not present

## 2022-07-28 DIAGNOSIS — R1319 Other dysphagia: Secondary | ICD-10-CM | POA: Diagnosis not present

## 2022-07-28 DIAGNOSIS — R202 Paresthesia of skin: Secondary | ICD-10-CM

## 2022-07-28 DIAGNOSIS — M9902 Segmental and somatic dysfunction of thoracic region: Secondary | ICD-10-CM | POA: Diagnosis not present

## 2022-07-28 DIAGNOSIS — M9908 Segmental and somatic dysfunction of rib cage: Secondary | ICD-10-CM

## 2022-07-28 DIAGNOSIS — M85852 Other specified disorders of bone density and structure, left thigh: Secondary | ICD-10-CM

## 2022-07-28 DIAGNOSIS — M9901 Segmental and somatic dysfunction of cervical region: Secondary | ICD-10-CM

## 2022-07-28 DIAGNOSIS — G43009 Migraine without aura, not intractable, without status migrainosus: Secondary | ICD-10-CM

## 2022-07-28 DIAGNOSIS — M9904 Segmental and somatic dysfunction of sacral region: Secondary | ICD-10-CM

## 2022-07-28 DIAGNOSIS — Z136 Encounter for screening for cardiovascular disorders: Secondary | ICD-10-CM

## 2022-07-28 DIAGNOSIS — M5030 Other cervical disc degeneration, unspecified cervical region: Secondary | ICD-10-CM

## 2022-07-28 DIAGNOSIS — Z0000 Encounter for general adult medical examination without abnormal findings: Secondary | ICD-10-CM

## 2022-07-28 LAB — CBC WITH DIFFERENTIAL/PLATELET
Basophils Absolute: 0.1 10*3/uL (ref 0.0–0.1)
Basophils Relative: 1.2 % (ref 0.0–3.0)
Eosinophils Absolute: 0.2 10*3/uL (ref 0.0–0.7)
Eosinophils Relative: 5.1 % — ABNORMAL HIGH (ref 0.0–5.0)
HCT: 44.8 % (ref 36.0–46.0)
Hemoglobin: 15.2 g/dL — ABNORMAL HIGH (ref 12.0–15.0)
Lymphocytes Relative: 38 % (ref 12.0–46.0)
Lymphs Abs: 1.6 10*3/uL (ref 0.7–4.0)
MCHC: 33.8 g/dL (ref 30.0–36.0)
MCV: 94.3 fl (ref 78.0–100.0)
Monocytes Absolute: 0.4 10*3/uL (ref 0.1–1.0)
Monocytes Relative: 9.7 % (ref 3.0–12.0)
Neutro Abs: 2 10*3/uL (ref 1.4–7.7)
Neutrophils Relative %: 46 % (ref 43.0–77.0)
Platelets: 315 10*3/uL (ref 150.0–400.0)
RBC: 4.75 Mil/uL (ref 3.87–5.11)
RDW: 12.7 % (ref 11.5–15.5)
WBC: 4.2 10*3/uL (ref 4.0–10.5)

## 2022-07-28 LAB — COMPREHENSIVE METABOLIC PANEL
ALT: 18 U/L (ref 0–35)
AST: 22 U/L (ref 0–37)
Albumin: 4.5 g/dL (ref 3.5–5.2)
Alkaline Phosphatase: 69 U/L (ref 39–117)
BUN: 13 mg/dL (ref 6–23)
CO2: 31 mEq/L (ref 19–32)
Calcium: 10.1 mg/dL (ref 8.4–10.5)
Chloride: 103 mEq/L (ref 96–112)
Creatinine, Ser: 0.83 mg/dL (ref 0.40–1.20)
GFR: 71.77 mL/min (ref >60.00)
Glucose, Bld: 90 mg/dL (ref 70–99)
Potassium: 4.1 mEq/L (ref 3.5–5.1)
Sodium: 142 mEq/L (ref 135–145)
Total Bilirubin: 0.6 mg/dL (ref 0.2–1.2)
Total Protein: 7.4 g/dL (ref 6.0–8.3)

## 2022-07-28 LAB — VITAMIN D 25 HYDROXY (VIT D DEFICIENCY, FRACTURES): VITD: 43.73 ng/mL (ref 30.00–100.00)

## 2022-07-28 LAB — HEMOGLOBIN A1C: Hgb A1c MFr Bld: 5.6 % (ref 4.6–6.5)

## 2022-07-28 LAB — TSH: TSH: 1.89 u[IU]/mL (ref 0.35–5.50)

## 2022-07-28 LAB — VITAMIN B12: Vitamin B-12: >1500 pg/mL — ABNORMAL HIGH (ref 211–911)

## 2022-07-28 LAB — LIPID PANEL
Cholesterol: 200 mg/dL (ref 0–200)
HDL: 86 mg/dL (ref >39.00)
LDL Cholesterol: 99 mg/dL (ref 0–99)
NonHDL: 113.85
Total CHOL/HDL Ratio: 2
Triglycerides: 76 mg/dL (ref 0.0–149.0)
VLDL: 15.2 mg/dL (ref 0.0–40.0)

## 2022-07-28 LAB — FOLATE: Folate: 15.4 ng/mL (ref >5.9)

## 2022-07-28 MED ORDER — HYDROCORTISONE (PERIANAL) 2.5 % EX CREA: 1.0000 | TOPICAL_CREAM | Freq: Two times a day (BID) | CUTANEOUS | 8 refills | Status: DC

## 2022-07-28 MED ORDER — SUMATRIPTAN SUCCINATE 100 MG PO TABS: ORAL_TABLET | ORAL | 8 refills | Status: DC

## 2022-07-28 NOTE — Assessment & Plan Note (Signed)
Chronic °Blood pressure well controlled °CMP °Continue amlodipine 5 mg daily °

## 2022-07-28 NOTE — Assessment & Plan Note (Signed)
Having some dysphasia

## 2022-07-28 NOTE — Assessment & Plan Note (Signed)
Subacute Has had some intermittent dysphagia and it has gotten a little worse H/o dilation many years ago Denies gerd but has dry cough Taking pepcid 20 mg HS - advised increasing to 40 mg daily Referral ordered for GI

## 2022-07-28 NOTE — Assessment & Plan Note (Signed)
Chronic ?Infrequent ?Continue Imitrex 50 mg as needed ?

## 2022-07-28 NOTE — Assessment & Plan Note (Addendum)
Chronic DEXA up-to-date Check vitamin D level Continue vitamin D and calcium supplementation Stressed regular exercise

## 2022-07-28 NOTE — Assessment & Plan Note (Signed)
Degenerative disc disease noted.  Discussed icing regimen and home exercises, which activities to do and which ones to avoid.  Increase activity slowly.  Follow-up again in 6 to 8 weeks otherwise.

## 2022-07-28 NOTE — Patient Instructions (Signed)
Thanks for saving the rose bush See me in 6 weeks

## 2022-07-28 NOTE — Assessment & Plan Note (Signed)
Chronic Not worse No pain Saw Dr Terrace Arabia - she would like blood work to rule out some causes of neuropathy - will order today

## 2022-07-28 NOTE — Assessment & Plan Note (Signed)
Chronic Check a1c Low sugar / carb diet Stressed regular exercise  

## 2022-07-28 NOTE — Assessment & Plan Note (Signed)
Chronic ?Controlled, Stable ?Continue bupropion 150 mg twice daily ?

## 2022-07-29 ENCOUNTER — Encounter: Payer: Self-pay | Admitting: Physician Assistant

## 2022-07-29 ENCOUNTER — Encounter: Payer: Self-pay | Admitting: Internal Medicine

## 2022-07-29 DIAGNOSIS — F418 Other specified anxiety disorders: Secondary | ICD-10-CM

## 2022-07-30 ENCOUNTER — Encounter: Payer: Self-pay | Admitting: Internal Medicine

## 2022-07-30 DIAGNOSIS — I10 Essential (primary) hypertension: Secondary | ICD-10-CM

## 2022-07-30 MED ORDER — BUPROPION HCL ER (SR) 150 MG PO TB12: 150.0000 mg | ORAL_TABLET | Freq: Two times a day (BID) | ORAL | 3 refills | Status: DC

## 2022-08-01 LAB — PROTEIN ELECTROPHORESIS, SERUM
Albumin ELP: 4.5 g/dL (ref 3.8–4.8)
Alpha 1: 0.2 g/dL (ref 0.2–0.3)
Alpha 2: 0.5 g/dL (ref 0.5–0.9)
Beta 2: 0.3 g/dL (ref 0.2–0.5)
Beta Globulin: 0.4 g/dL (ref 0.4–0.6)
Gamma Globulin: 0.9 g/dL (ref 0.8–1.7)
Total Protein: 6.9 g/dL (ref 6.1–8.1)

## 2022-08-01 LAB — RPR: RPR Ser Ql: NONREACTIVE

## 2022-08-01 LAB — METHYLMALONIC ACID, SERUM: Methylmalonic Acid, Quant: 102 nmol/L (ref 87–318)

## 2022-08-01 LAB — ANA: Anti Nuclear Antibody (ANA): NEGATIVE

## 2022-08-01 MED ORDER — AMLODIPINE BESYLATE 5 MG PO TABS: 5.0000 mg | ORAL_TABLET | Freq: Every day | ORAL | 3 refills | Status: DC

## 2022-08-03 ENCOUNTER — Telehealth: Payer: Self-pay | Admitting: Internal Medicine

## 2022-08-03 NOTE — Telephone Encounter (Signed)
Called patient to schedule Medicare Annual Wellness Visit (AWV). Left message for patient to call back and schedule Medicare Annual Wellness Visit (AWV).  Last date of AWV: 09/16/2021  Please schedule an appointment at any time with [facility].  If any questions, please contact me at [phone].  Thank you ,  Randon Goldsmith Care Guide [facility] AWV TEAM Direct Dial: [phone]

## 2022-08-10 ENCOUNTER — Telehealth: Payer: Self-pay | Admitting: Internal Medicine

## 2022-08-10 NOTE — Telephone Encounter (Signed)
Contacted Lindsay [name] to schedule their annual wellness visit. Appointment made for 08/23/2022.  [facility] Care Guide [facility] AWV TEAM Direct Dial: [phone]

## 2022-08-23 ENCOUNTER — Ambulatory Visit (INDEPENDENT_AMBULATORY_CARE_PROVIDER_SITE_OTHER): Payer: Medicare HMO

## 2022-08-23 ENCOUNTER — Ambulatory Visit (HOSPITAL_COMMUNITY)
Admission: RE | Admit: 2022-08-23 | Discharge: 2022-08-23 | Disposition: A | Discharge status: Home / Self Care | Payer: Medicare HMO | Source: Ambulatory Visit | Attending: Internal Medicine | Admitting: Internal Medicine

## 2022-08-23 VITALS — Wt 123.0 lb

## 2022-08-23 DIAGNOSIS — Z136 Encounter for screening for cardiovascular disorders: Secondary | ICD-10-CM | POA: Insufficient documentation

## 2022-08-23 DIAGNOSIS — Z0000 Encounter for general adult medical examination without abnormal findings: Secondary | ICD-10-CM | POA: Diagnosis not present

## 2022-08-23 NOTE — Patient Instructions (Signed)
Lindsay [name] , Thank you for taking time to come for your Medicare Wellness Visit. I appreciate your ongoing commitment to your health goals. Please review the following plan we discussed and let me know if I can assist you in the future.   These are the goals we discussed:  Goals   None     This is a list of the screening recommended for you and due dates:  Health Maintenance  Topic Date Due   COVID-19 Vaccine (5 - 2023-24 season) 08/23/2023*   Flu Shot  11/03/2022   DEXA scan (bone density measurement)  12/17/2022   Medicare Annual Wellness Visit  08/23/2023   Mammogram  08/25/2023   Colon Cancer Screening  08/13/2024   DTaP/Tdap/Td vaccine (4 - Td or Tdap) 08/16/2027   Pneumonia Vaccine  Completed   Hepatitis C Screening: USPSTF Recommendation to screen - Ages 26-79 yo.  Completed   Zoster (Shingles) Vaccine  Completed   HPV Vaccine  Aged Out  *Topic was postponed. The date shown is not the original due date.    Advanced directives: Please bring a copy of your health care power of attorney and living will to the office to be added to your chart at your convenience.   Conditions/risks identified: Aim for 30 minutes of exercise or brisk walking, 6-8 glasses of water, and 5 servings of fruits and vegetables each day.   Next appointment: Follow up in one year for your annual wellness visit 08/24/2023   Preventive Care 65 Years and Older, Female Preventive care refers to lifestyle choices and visits with your health care provider that can promote health and wellness. What does preventive care include? A yearly physical exam. This is also called an annual well check. Dental exams once or twice a year. Routine eye exams. Ask your health care provider how often you should have your eyes checked. Personal lifestyle choices, including: Daily care of your teeth and gums. Regular physical activity. Eating a healthy diet. Avoiding tobacco and drug use. Limiting alcohol use. Practicing  safe sex. Taking low-dose aspirin every day. Taking vitamin and mineral supplements as recommended by your health care provider. What happens during an annual well check? The services and screenings done by your health care provider during your annual well check will depend on your age, overall health, lifestyle risk factors, and family history of disease. Counseling  Your health care provider may ask you questions about your: Alcohol use. Tobacco use. Drug use. Emotional well-being. Home and relationship well-being. Sexual activity. Eating habits. History of falls. Memory and ability to understand (cognition). Work and work Astronomer. Reproductive health. Screening  You may have the following tests or measurements: Height, weight, and BMI. Blood pressure. Lipid and cholesterol levels. These may be checked every 5 years, or more frequently if you are over 57 years old. Skin check. Lung cancer screening. You may have this screening every year starting at age 5 if you have a 30-pack-year history of smoking and currently smoke or have quit within the past 15 years. Fecal occult blood test (FOBT) of the stool. You may have this test every year starting at age 65. Flexible sigmoidoscopy or colonoscopy. You may have a sigmoidoscopy every 5 years or a colonoscopy every 10 years starting at age 56. Hepatitis C blood test. Hepatitis B blood test. Sexually transmitted disease (STD) testing. Diabetes screening. This is done by checking your blood sugar (glucose) after you have not eaten for a while (fasting). You may have this done  every 1-3 years. Bone density scan. This is done to screen for osteoporosis. You may have this done starting at age 40. Mammogram. This may be done every 1-2 years. Talk to your health care provider about how often you should have regular mammograms. Talk with your health care provider about your test results, treatment options, and if necessary, the need for more  tests. Vaccines  Your health care provider may recommend certain vaccines, such as: Influenza vaccine. This is recommended every year. Tetanus, diphtheria, and acellular pertussis (Tdap, Td) vaccine. You may need a Td booster every 10 years. Zoster vaccine. You may need this after age 67. Pneumococcal 13-valent conjugate (PCV13) vaccine. One dose is recommended after age 69. Pneumococcal polysaccharide (PPSV23) vaccine. One dose is recommended after age 37. Talk to your health care provider about which screenings and vaccines you need and how often you need them. This information is not intended to replace advice given to you by your health care provider. Make sure you discuss any questions you have with your health care provider. Document Released: 04/17/2015 Document Revised: 12/09/2015 Document Reviewed: 01/20/2015 Elsevier Interactive Patient Education  2017 [employer].  Fall Prevention in the Home Falls can cause injuries. They can happen to people of all ages. There are many things you can do to make your home safe and to help prevent falls. What can I do on the outside of my home? Regularly fix the edges of walkways and driveways and fix any cracks. Remove anything that might make you trip as you walk through a door, such as a raised step or threshold. Trim any bushes or trees on the path to your home. Use bright outdoor lighting. Clear any walking paths of anything that might make someone trip, such as rocks or tools. Regularly check to see if handrails are loose or broken. Make sure that both sides of any steps have handrails. Any raised decks and porches should have guardrails on the edges. Have any leaves, snow, or ice cleared regularly. Use sand or salt on walking paths during winter. Clean up any spills in your garage right away. This includes oil or grease spills. What can I do in the bathroom? Use night lights. Install grab bars by the toilet and in the tub and shower.  Do not use towel bars as grab bars. Use non-skid mats or decals in the tub or shower. If you need to sit down in the shower, use a plastic, non-slip stool. Keep the floor dry. Clean up any water that spills on the floor as soon as it happens. Remove soap buildup in the tub or shower regularly. Attach bath mats securely with double-sided non-slip rug tape. Do not have throw rugs and other things on the floor that can make you trip. What can I do in the bedroom? Use night lights. Make sure that you have a light by your bed that is easy to reach. Do not use any sheets or blankets that are too big for your bed. They should not hang down onto the floor. Have a firm chair that has side arms. You can use this for support while you get dressed. Do not have throw rugs and other things on the floor that can make you trip. What can I do in the kitchen? Clean up any spills right away. Avoid walking on wet floors. Keep items that you use a lot in easy-to-reach places. If you need to reach something above you, use a strong step stool that has  a grab bar. Keep electrical cords out of the way. Do not use floor polish or wax that makes floors slippery. If you must use wax, use non-skid floor wax. Do not have throw rugs and other things on the floor that can make you trip. What can I do with my stairs? Do not leave any items on the stairs. Make sure that there are handrails on both sides of the stairs and use them. Fix handrails that are broken or loose. Make sure that handrails are as long as the stairways. Check any carpeting to make sure that it is firmly attached to the stairs. Fix any carpet that is loose or worn. Avoid having throw rugs at the top or bottom of the stairs. If you do have throw rugs, attach them to the floor with carpet tape. Make sure that you have a light switch at the top of the stairs and the bottom of the stairs. If you do not have them, ask someone to add them for you. What else  can I do to help prevent falls? Wear shoes that: Do not have high heels. Have rubber bottoms. Are comfortable and fit you well. Are closed at the toe. Do not wear sandals. If you use a stepladder: Make sure that it is fully opened. Do not climb a closed stepladder. Make sure that both sides of the stepladder are locked into place. Ask someone to hold it for you, if possible. Clearly mark and make sure that you can see: Any grab bars or handrails. First and last steps. Where the edge of each step is. Use tools that help you move around (mobility aids) if they are needed. These include: Canes. Walkers. Scooters. Crutches. Turn on the lights when you go into a dark area. Replace any light bulbs as soon as they burn out. Set up your furniture so you have a clear path. Avoid moving your furniture around. If any of your floors are uneven, fix them. If there are any pets around you, be aware of where they are. Review your medicines with your doctor. Some medicines can make you feel dizzy. This can increase your chance of falling. Ask your doctor what other things that you can do to help prevent falls. This information is not intended to replace advice given to you by your health care provider. Make sure you discuss any questions you have with your health care provider. Document Released: 01/15/2009 Document Revised: 08/27/2015 Document Reviewed: 04/25/2014 Elsevier Interactive Patient Education  2017 [employer].

## 2022-08-23 NOTE — Progress Notes (Signed)
Subjective:   Lindsay [name] is a 72 y.o. female who presents for Medicare Annual (Subsequent) preventive examination.  Review of Systems    I connected with  Lindsay [name] on 08/23/22 by a video and audio enabled telemedicine application and verified that I am speaking with the correct person using two identifiers.  Patient Location: Home  Provider Location: Home Office  I discussed the limitations of evaluation and management by telemedicine. The patient expressed understanding and agreed to proceed.  Cardiac Risk Factors include: advanced age (>60men, >31 women);hypertension     Objective:    Today's Vitals   08/23/22 0840  Weight: 123 lb (55.8 kg)   Body mass index is 20.47 kg/m.     08/23/2022    8:47 AM 09/16/2021    4:25 PM  Advanced Directives  Does Patient Have a Medical Advance Directive? Yes No  Type of Estate agent of [place];Living will   Copy of Healthcare Power of Attorney in Chart? No - copy requested     Current Medications (verified) Outpatient Encounter Medications as of 08/23/2022  Medication Sig   amLODipine (NORVASC) 5 MG tablet Take 1 tablet (5 mg total) by mouth daily.   buPROPion (WELLBUTRIN SR) 150 MG 12 hr tablet Take 1 tablet (150 mg total) by mouth 2 (two) times daily.   Cholecalciferol (VITAMIN D-3 PO) Take 1 capsule by mouth daily.   Cyanocobalamin (VITAMIN B-12 PO) Take by mouth.   diphenhydramine-acetaminophen (TYLENOL PM) 25-500 MG TABS Take 2 tablets by mouth at bedtime as needed (for sleep).    docusate sodium (COLACE) 100 MG capsule Take 200 mg by mouth at bedtime.   estradiol (ESTRACE) 0.1 MG/GM vaginal cream Place 0.5 g vaginally 2 (two) times a week. Place 0.5g nightly for two weeks then twice a week after   famotidine (PEPCID) 20 MG tablet Take 20 mg by mouth 2 (two) times daily.   fish oil-omega-3 fatty acids 1000 MG capsule Take 1 g by mouth daily.   hydrocortisone (ANUSOL-HC) 2.5 % rectal cream Place 1  Application rectally 2 (two) times daily.   Inositol Niacinate (NO FLUSH NIACIN) 250 MG TABS Take 250 mg by mouth in the morning.   MAGNESIUM PO Take 1 tablet by mouth at bedtime.    Misc Natural Products (TART CHERRY ADVANCED PO) Take 1 tablet by mouth at bedtime.    SUMAtriptan (IMITREX) 100 MG tablet TAKE 1/2-1 TABLET BY MOUTH AT ONSET MIGRAINE,MAY REPEAT ONCE 2HOURS IF NEED, MDD 2   Turmeric (QC TUMERIC COMPLEX PO) Take by mouth.   vitamin E 180 MG (400 UNITS) capsule Take 400 Units by mouth daily.   No facility-administered encounter medications on file as of 08/23/2022.    Allergies (verified) Patient has no known allergies.   History: Past Medical History:  Diagnosis Date   COMMON MIGRAINE 09/20/2006   DEPRESSION 06/11/2009   GERD 06/11/2009   Hypertension 04/17/2018   Neck pain    eval with sports medicine in 2016/17   Osteopenia 04/17/2018   Past Surgical History:  Procedure Laterality Date   ABDOMINAL HYSTERECTOMY     BILATERAL OOPHORECTOMY     CHOLECYSTECTOMY     OVARIAN CYST REMOVAL     REPAIR EXTENSOR TENDON HAND     TONSILLECTOMY     Family History  Problem Relation Age of Onset   Dementia Mother    Osteoporosis Mother    Breast cancer Sister 53   Heart attack Brother  Colon cancer Neg Hx    Esophageal cancer Neg Hx    Rectal cancer Neg Hx    Stomach cancer Neg Hx    Social History   Socioeconomic History   Marital status: Married    Spouse name: Not on file   Number of children: Not on file   Years of education: Not on file   Highest education level: Not on file  Occupational History   Not on file  Tobacco Use   Smoking status: Former    Types: Cigarettes    Quit date: 04/04/2004    Years since quitting: 18.3   Smokeless tobacco: Never  Vaping Use   Vaping Use: Never used  Substance and Sexual Activity   Alcohol use: Yes    Alcohol/week: 0.0 standard drinks of alcohol    Comment: occ   Drug use: No   Sexual activity: Not Currently  Other  Topics Concern   Not on file  Social History Narrative   Not on file   Social Determinants of Health   Financial Resource Strain: Low Risk  (08/23/2022)   Overall Financial Resource Strain (CARDIA)    Difficulty of Paying Living Expenses: Not hard at all  Food Insecurity: No Food Insecurity (08/23/2022)   Hunger Vital Sign    Worried About Running Out of Food in the Last Year: Never true    Ran Out of Food in the Last Year: Never true  Transportation Needs: No Transportation Needs (08/23/2022)   PRAPARE - Administrator, Civil Service (Medical): No    Lack of Transportation (Non-Medical): No  Physical Activity: Sufficiently Active (09/16/2021)   Exercise Vital Sign    Days of Exercise per Week: 3 days    Minutes of Exercise per Session: 60 min  Stress: No Stress Concern Present (08/23/2022)   [employer] of Occupational Health - Occupational Stress Questionnaire    Feeling of Stress : Not at all  Social Connections: Socially Integrated (08/23/2022)   Social Connection and Isolation Panel [NHANES]    Frequency of Communication with Friends and Family: More than three times a week    Frequency of Social Gatherings with Friends and Family: More than three times a week    Attends Religious Services: More than 4 times per year    Active Member of [employer] or Organizations: Yes    Attends Engineer, structural: More than 4 times per year    Marital Status: Married    Tobacco Counseling Counseling given: Yes   Clinical Intake:  Pre-visit preparation completed: Yes  Pain : No/denies pain     BMI - recorded: 20.47 Nutritional Status: BMI of 19-24  Normal Nutritional Risks: None Diabetes: No  How often do you need to have someone help you when you read instructions, pamphlets, or other written materials from your doctor or pharmacy?: 1 - Never  Diabetic?no  Interpreter Needed?: No  Information entered by :: Fredirick Maudlin   Activities of Daily  Living    08/23/2022    8:48 AM 09/16/2021    4:26 PM  In your present state of health, do you have any difficulty performing the following activities:  Hearing? 0 0  Vision? 0 0  Difficulty concentrating or making decisions? 0 0  Walking or climbing stairs? 0 0  Dressing or bathing? 0 0  Doing errands, shopping? 0 0  Preparing Food and eating ? N N  Using the Toilet? N N  In the past six months, have  you accidently leaked urine? N N  Do you have problems with loss of bowel control? N N  Managing your Medications? N N  Managing your Finances? N N  Housekeeping or managing your Housekeeping? N N    Patient Care Team: Pincus Sanes, MD as PCP - General (Internal Medicine) Judi Saa, DO as Consulting Physician (Family Medicine) Arminda Resides, MD as Consulting Physician (Dermatology)  Indicate any recent Medical Services you may have received from other than [facility] providers in the past year (date may be approximate).     Assessment:   This is a routine wellness examination for Lakenzie.  Hearing/Vision screen Hearing Screening - Comments:: Denies hearing difficulties   Vision Screening - Comments:: Wears rx glasses - up to date with routine eye exams with  [facility]   Dietary issues and exercise activities discussed: Current Exercise Habits: Home exercise routine, Type of exercise: walking, Time (Minutes): 40 (playing with grandson), Frequency (Times/Week): 3, Weekly Exercise (Minutes/Week): 120, Intensity: Mild   Goals Addressed   None   Depression Screen    08/23/2022    8:43 AM 07/28/2022    9:35 AM 09/16/2021    4:26 PM 09/16/2021    4:23 PM 07/13/2021    8:49 AM 07/07/2020   12:08 PM 02/25/2020   11:48 AM  PHQ 2/9 Scores  PHQ - 2 Score 0 1 0 0 2 1 0  PHQ- 9 Score 0 4   2 3  0    Fall Risk    08/23/2022    8:48 AM 07/28/2022    9:35 AM 09/16/2021    4:26 PM 07/13/2021    8:49 AM 07/07/2020   10:01 AM  Fall Risk   Falls in the past year? 0 0 0 1 0  Number falls  in past yr: 0 0 0 0 0  Injury with Fall? 0 0 0 0 0  Risk for fall due to : No Fall Risks No Fall Risks No Fall Risks No Fall Risks No Fall Risks  Follow up Falls prevention discussed;Falls evaluation completed Falls evaluation completed Falls evaluation completed Falls evaluation completed Falls evaluation completed    FALL RISK PREVENTION PERTAINING TO THE HOME:  Any stairs in or around the home? Yes  If so, are there any without handrails? No  Home free of loose throw rugs in walkways, pet beds, electrical cords, etc? No  Adequate lighting in your home to reduce risk of falls? Yes   ASSISTIVE DEVICES UTILIZED TO PREVENT FALLS:  Life alert? No  Use of a cane, walker or w/c? No  Grab bars in the bathroom? No  Shower chair or bench in shower? No  Elevated toilet seat or a handicapped toilet? No   TIMED UP AND GO:  Was the test performed?  NO, Televisit .   Cognitive Function:        08/23/2022    8:45 AM  6CIT Screen  What Year? 0 points  What month? 0 points  What time? 0 points  Count back from 20 0 points  Months in reverse 0 points  Repeat phrase 0 points  Total Score 0 points    Immunizations Immunization History  Administered Date(s) Administered   Fluad Quad(high Dose 65+) 12/25/2018, 02/29/2020, 01/18/2021, 03/18/2022   Influenza Split 01/05/2012, 01/16/2013   Influenza Whole 01/25/2007, 01/04/2008   Influenza-Unspecified 01/31/2014, 01/02/2017, 01/09/2018   Meningococcal polysaccharide vaccine (MPSV4) 01/16/2013   PFIZER(Purple Top)SARS-COV-2 Vaccination 04/30/2019, 05/21/2019, 01/01/2020   Pfizer Covid-19 Vaccine Bivalent  Booster 71yrs & up 02/15/2021   Pneumococcal Conjugate-13 01/31/2014   Pneumococcal Polysaccharide-23 01/09/2018   Td 08/13/2007   Tdap 08/13/2007, 08/15/2017   Zoster Recombinat (Shingrix) 11/12/2020, 03/12/2021    TDAP status: Up to date  Flu Vaccine status: Up to date  Pneumococcal vaccine status: Up to date  Covid-19  vaccine status: Information provided on how to obtain vaccines.   Qualifies for Shingles Vaccine? Yes   Zostavax completed Yes   Shingrix Completed?: Yes  Screening Tests Health Maintenance  Topic Date Due   COVID-19 Vaccine (5 - 2023-24 season) 08/23/2023 (Originally 12/03/2021)   INFLUENZA VACCINE  11/03/2022   DEXA SCAN  12/17/2022   Medicare Annual Wellness (AWV)  08/23/2023   MAMMOGRAM  08/25/2023   COLONOSCOPY (Pts 45-45yrs Insurance coverage will need to be confirmed)  08/13/2024   DTaP/Tdap/Td (4 - Td or Tdap) 08/16/2027   Pneumonia Vaccine 58+ Years old  Completed   Hepatitis C Screening  Completed   Zoster Vaccines- Shingrix  Completed   HPV VACCINES  Aged Out    Health Maintenance  There are no preventive care reminders to display for this patient.   Colorectal cancer screening: Type of screening: Colonoscopy. Completed 04/14/2009. Repeat every 5-10 years  Mammogram status: Completed 08/24/21. Repeat every year  Bone Density status: Completed 12/17/2019. Results reflect: Bone density results: OSTEOPENIA. Repeat every 2 years.  Lung Cancer Screening: (Low Dose CT Chest recommended if Age 67-80 years, 30 pack-year currently smoking OR have quit w/in 15years.) does not qualify.   Lung Cancer Screening Referral: no  Additional Screening:  Hepatitis C Screening: does qualify; Completed 05/04/2015  Vision Screening: Recommended annual ophthalmology exams for early detection of glaucoma and other disorders of the eye. Is the patient up to date with their annual eye exam?  Yes  Who is the provider or what is the name of the office in which the patient attends annual eye exams? [facility]  If pt is not established with a provider, would they like to be referred to a provider to establish care? No .   Dental Screening: Recommended annual dental exams for proper oral hygiene  Community Resource Referral / Chronic Care Management: CRR required this visit?  No   CCM  required this visit?  No      Plan:     I have personally reviewed and noted the following in the patient's chart:   Medical and social history Use of alcohol, tobacco or illicit drugs  Current medications and supplements including opioid prescriptions. Patient is not currently taking opioid prescriptions. Functional ability and status Nutritional status Physical activity Advanced directives List of other physicians Hospitalizations, surgeries, and ER visits in previous 12 months Vitals Screenings to include cognitive, depression, and falls Referrals and appointments  In addition, I have reviewed and discussed with patient certain preventive protocols, quality metrics, and best practice recommendations. A written personalized care plan for preventive services as well as general preventive health recommendations were provided to patient.     Annabell Sabal, CMA   08/23/2022   Nurse Notes: none

## 2022-08-25 ENCOUNTER — Encounter: Payer: Self-pay | Admitting: Internal Medicine

## 2022-08-25 DIAGNOSIS — R931 Abnormal findings on diagnostic imaging of heart and coronary circulation: Secondary | ICD-10-CM | POA: Insufficient documentation

## 2022-08-26 ENCOUNTER — Ambulatory Visit: Payer: Medicare HMO

## 2022-08-31 NOTE — Progress Notes (Unsigned)
  [name] [address] [place] [address] Phone: [phone] Subjective:   Lindsay [name], am serving as a scribe for Dr. Antoine Primas.  I'm seeing this patient by the request  of:  Pincus Sanes, MD  CC: Back and neck pain follow-up  [id number]  Lindsay [name] is a 72 y.o. female coming in with complaint of back and neck pain. OMT 07/28/2022. Patient states that her neck is bothering her more since last visit.  Seems that has had some more anxiety recently and thinks that this is contributing.  Not being able to be as quite as active as usual.  Medications patient has been prescribed: None  Taking:         Reviewed prior external information including notes and imaging from previsou exam, outside providers and external EMR if available.   As well as notes that were available from care everywhere and other healthcare systems.  Past medical history, social, surgical and family history all reviewed in electronic medical record.  No pertanent information unless stated regarding to the chief complaint.   Past Medical History:  Diagnosis Date   COMMON MIGRAINE 09/20/2006   DEPRESSION 06/11/2009   GERD 06/11/2009   Hypertension 04/17/2018   Neck pain    eval with sports medicine in 2016/17   Osteopenia 04/17/2018    No Known Allergies   Review of Systems:  No headache, visual changes, nausea, vomiting, diarrhea, constipation, dizziness, abdominal pain, skin rash, fevers, chills, night sweats, weight loss, swollen lymph nodes, body aches, joint swelling, chest pain, shortness of breath, mood changes. POSITIVE muscle aches  Objective  Blood pressure 132/84, height 5\' 5"  (1.651 m), weight 121 lb (54.9 kg).   General: No apparent distress alert and oriented x3 mood and affect normal, dressed appropriately.  HEENT: Pupils equal, extraocular movements intact  Respiratory: Patient's speak in full sentences and does not appear short of  breath  Cardiovascular: No lower extremity edema, non tender, no erythema  Neck exam does have some loss lordosis noted.  Has some tenderness to palpation in the paraspinal musculature mostly in the thoracic spine.  Patient does have tightness with sidebending of the neck bilaterally.  Osteopathic findings  C2 flexed rotated and side bent right C6 flexed rotated and side bent left C7 flexed rotated and side bent right T3 extended rotated and side bent right inhaled rib T9 extended rotated and side bent left L2 flexed rotated and side bent right Sacrum right on right       Assessment and Plan:  No problem-specific Assessment & Plan notes found for this encounter.    Nonallopathic problems  Decision today to treat with OMT was based on Physical Exam  After verbal consent patient was treated with HVLA, ME, FPR techniques in cervical, rib, thoracic, lumbar, and sacral  areas  Patient tolerated the procedure well with improvement in symptoms  Patient given exercises, stretches and lifestyle modifications  See medications in patient instructions if given  Patient will follow up in 4-8 weeks     The above documentation has been reviewed and is accurate and complete Judi Saa, DO         Note: This dictation was prepared with Dragon dictation along with smaller phrase technology. Any transcriptional errors that result from this process are unintentional.

## 2022-09-01 ENCOUNTER — Ambulatory Visit: Payer: Medicare HMO | Admitting: Family Medicine

## 2022-09-01 ENCOUNTER — Encounter: Payer: Self-pay | Admitting: Family Medicine

## 2022-09-01 VITALS — BP 132/84 | Ht 65.0 in | Wt 121.0 lb

## 2022-09-01 DIAGNOSIS — M9902 Segmental and somatic dysfunction of thoracic region: Secondary | ICD-10-CM

## 2022-09-01 DIAGNOSIS — M5030 Other cervical disc degeneration, unspecified cervical region: Secondary | ICD-10-CM | POA: Diagnosis not present

## 2022-09-01 DIAGNOSIS — M9903 Segmental and somatic dysfunction of lumbar region: Secondary | ICD-10-CM

## 2022-09-01 DIAGNOSIS — M9908 Segmental and somatic dysfunction of rib cage: Secondary | ICD-10-CM | POA: Diagnosis not present

## 2022-09-01 DIAGNOSIS — M9904 Segmental and somatic dysfunction of sacral region: Secondary | ICD-10-CM

## 2022-09-01 DIAGNOSIS — M9901 Segmental and somatic dysfunction of cervical region: Secondary | ICD-10-CM

## 2022-09-01 NOTE — Assessment & Plan Note (Signed)
Known relatively severe arthritis.  The patient is fairly noncompliant when it comes to the medication such as gabapentin.  Do feel that stress is potentially playing a role.  Will continue to consider the possibility of Cymbalta long-term.  Patient is hesitant to start any new medications.  Discussed with patient that we will need to continue to monitor and follow-up again in 5 to 6 weeks.  Did respond well to osteopathic manipulation today.

## 2022-09-01 NOTE — Patient Instructions (Signed)
See me in 6 weeks 

## 2022-09-04 ENCOUNTER — Encounter: Payer: Self-pay | Admitting: Internal Medicine

## 2022-09-04 DIAGNOSIS — R931 Abnormal findings on diagnostic imaging of heart and coronary circulation: Secondary | ICD-10-CM

## 2022-09-05 ENCOUNTER — Ambulatory Visit: Payer: Medicare HMO

## 2022-09-05 DIAGNOSIS — Z0100 Encounter for examination of eyes and vision without abnormal findings: Secondary | ICD-10-CM | POA: Diagnosis not present

## 2022-09-06 MED ORDER — ROSUVASTATIN CALCIUM 5 MG PO TABS: 5.0000 mg | ORAL_TABLET | ORAL | 3 refills | Status: DC

## 2022-09-22 ENCOUNTER — Ambulatory Visit
Admission: RE | Admit: 2022-09-22 | Discharge: 2022-09-22 | Disposition: A | Discharge status: Home / Self Care | Payer: Medicare HMO | Source: Ambulatory Visit | Attending: Internal Medicine | Admitting: Internal Medicine

## 2022-09-22 DIAGNOSIS — Z1231 Encounter for screening mammogram for malignant neoplasm of breast: Secondary | ICD-10-CM

## 2022-09-30 ENCOUNTER — Encounter: Payer: Self-pay | Admitting: Physician Assistant

## 2022-09-30 ENCOUNTER — Ambulatory Visit: Payer: Medicare HMO | Admitting: Physician Assistant

## 2022-09-30 VITALS — BP 120/80 | HR 84 | Ht 65.0 in | Wt 125.0 lb

## 2022-09-30 DIAGNOSIS — R1319 Other dysphagia: Secondary | ICD-10-CM

## 2022-09-30 DIAGNOSIS — K219 Gastro-esophageal reflux disease without esophagitis: Secondary | ICD-10-CM | POA: Diagnosis not present

## 2022-09-30 DIAGNOSIS — Z9889 Other specified postprocedural states: Secondary | ICD-10-CM

## 2022-09-30 NOTE — Progress Notes (Signed)
I agree with the assessment and plan as outlined by Ms. Esterwood. ?

## 2022-09-30 NOTE — Progress Notes (Signed)
Subjective:    Patient ID: Lindsay [name], female    DOB: 09-04-1950, 72 y.o.   MRN: [id number]  HPI  Lindsay [name] is a pleasant 72 year old white female, new today with complaint of dysphagia.  She has known remotely to Dr. Arlyce Dice from colonoscopy done in May 2016 which was a normal exam, and had been seen by Dr. Jarold Motto very remotely and had EGD in May 2006 also done for dysphagia.  She had a few erosions in the gastric cardia, no esophagitis commented on and no stricture identified but empirically was dilated to 56 [place] Maloney. Patient says that she did get good relief of her symptoms after that dilation and had not had any problems until over the past year or so when she started noticing very occasional episodes of dysphagia or sense of food lodging in her esophagus.  She says currently this may be occurring once or twice per month, she has to stop eating and stretch out her chest to allow the food to traverse, and then is generally able to go back to eating.  She has not had any episodes requiring regurgitation.  No complaints of abdominal pain, no changes in bowel habits, no melena or hematochezia. She has not had any dysphagia when just consuming liquids. She does endorse frequent heartburn which she says manifests as burping and belching and occasional mild indigestion type symptoms.  She has a 20 mg Pepcid tablet but she is she has not been good about taking that regularly and usually only takes OTC Pepcid or a 20 mg Pepcid if she is symptomatic.  She states she does not want to be on any medicines that she absolutely does not need. Otherwise in generally good health with history of migraine headaches, and hypertension.  Review of Systems Pertinent positive and negative review of systems were noted in the above HPI section.  All other review of systems was otherwise negative.   Outpatient Encounter Medications as of 09/30/2022  Medication Sig   amLODipine (NORVASC) 5 MG tablet Take 1 tablet (5  mg total) by mouth daily.   buPROPion (WELLBUTRIN SR) 150 MG 12 hr tablet Take 1 tablet (150 mg total) by mouth 2 (two) times daily.   Cholecalciferol (VITAMIN D-3 PO) Take 1 capsule by mouth daily.   Cyanocobalamin (VITAMIN B-12 PO) Take by mouth.   diphenhydramine-acetaminophen (TYLENOL PM) 25-500 MG TABS Take 2 tablets by mouth at bedtime as needed (for sleep).    docusate sodium (COLACE) 100 MG capsule Take 200 mg by mouth at bedtime.   estradiol (ESTRACE) 0.1 MG/GM vaginal cream Place 0.5 g vaginally 2 (two) times a week. Place 0.5g nightly for two weeks then twice a week after   famotidine (PEPCID) 20 MG tablet Take 20 mg by mouth 2 (two) times daily.   fish oil-omega-3 fatty acids 1000 MG capsule Take 1 g by mouth daily.   hydrocortisone (ANUSOL-HC) 2.5 % rectal cream Place 1 Application rectally 2 (two) times daily.   Inositol Niacinate (NO FLUSH NIACIN) 250 MG TABS Take 250 mg by mouth in the morning.   MAGNESIUM PO Take 1 tablet by mouth at bedtime.    Misc Natural Products (TART CHERRY ADVANCED PO) Take 1 tablet by mouth at bedtime.    rosuvastatin (CRESTOR) 5 MG tablet Take 1 tablet (5 mg total) by mouth 3 (three) times a week.   SUMAtriptan (IMITREX) 100 MG tablet TAKE 1/2-1 TABLET BY MOUTH AT ONSET MIGRAINE,MAY REPEAT ONCE 2HOURS IF NEED, MDD  2   Turmeric (QC TUMERIC COMPLEX PO) Take by mouth.   vitamin E 180 MG (400 UNITS) capsule Take 400 Units by mouth daily.   No facility-administered encounter medications on file as of 09/30/2022.   No Known Allergies Patient Active Problem List   Diagnosis Date Noted   Agatston coronary artery calcium score 176  (08/2022) 08/25/2022   Other dysphagia 07/28/2022   Hyperglycemia 07/27/2022   Paresthesia of foot, bilateral 09/07/2021   Anal fissure 12/17/2019   Nondisplaced fracture of lateral malleolus of right fibula, initial encounter for closed fracture 06/11/2019   Depression with anxiety 09/20/2018   Bilateral carpal tunnel syndrome  08/03/2018   Retinal hemorrhage 05/02/2018   Hypertension 04/17/2018   Osteopenia 04/17/2018   Degeneration of lateral meniscus of right knee 05/05/2015   Degenerative cervical disc 12/03/2014   Nonallopathic lesion of cervical region 12/03/2014   Nonallopathic lesion of thoracic region 12/03/2014   Nonallopathic lesion-rib cage 12/03/2014   GERD 06/11/2009   NECK PAIN 07/21/2008   Migraine without aura 09/20/2006   Social History   Socioeconomic History   Marital status: Married    Spouse name: Not on file   Number of children: Not on file   Years of education: Not on file   Highest education level: Not on file  Occupational History   Not on file  Tobacco Use   Smoking status: Former    Types: Cigarettes    Quit date: 04/04/2004    Years since quitting: 18.5   Smokeless tobacco: Never  Vaping Use   Vaping Use: Never used  Substance and Sexual Activity   Alcohol use: Yes    Alcohol/week: 0.0 standard drinks of alcohol    Comment: occ   Drug use: No   Sexual activity: Not Currently  Other Topics Concern   Not on file  Social History Narrative   Not on file   Social Determinants of Health   Financial Resource Strain: Low Risk  (08/23/2022)   Overall Financial Resource Strain (CARDIA)    Difficulty of Paying Living Expenses: Not hard at all  Food Insecurity: No Food Insecurity (08/23/2022)   Hunger Vital Sign    Worried About Running Out of Food in the Last Year: Never true    Ran Out of Food in the Last Year: Never true  Transportation Needs: No Transportation Needs (08/23/2022)   PRAPARE - Administrator, Civil Service (Medical): No    Lack of Transportation (Non-Medical): No  Physical Activity: Sufficiently Active (09/16/2021)   Exercise Vital Sign    Days of Exercise per Week: 3 days    Minutes of Exercise per Session: 60 min  Stress: No Stress Concern Present (08/23/2022)   [employer] of Occupational Health - Occupational Stress Questionnaire     Feeling of Stress : Not at all  Social Connections: Socially Integrated (08/23/2022)   Social Connection and Isolation Panel [NHANES]    Frequency of Communication with Friends and Family: More than three times a week    Frequency of Social Gatherings with Friends and Family: More than three times a week    Attends Religious Services: More than 4 times per year    Active Member of [employer] or Organizations: Yes    Attends Banker Meetings: More than 4 times per year    Marital Status: Married  Catering manager Violence: Not At Risk (08/23/2022)   Humiliation, Afraid, Rape, and Kick questionnaire    Fear of Current or Ex-Partner:  No    Emotionally Abused: No    Physically Abused: No    Sexually Abused: No    Ms. Antenucci's family history includes Breast cancer (age of onset: 5) in her sister; Dementia in her mother; Heart attack in her brother; Osteoporosis in her mother.      Objective:    Vitals:   09/30/22 0834  BP: 120/80  Pulse: 84    Physical Exam Well-developed well-nourished folder WF in no acute distress.  Height, Weight,125 BMI 20.8  HEENT; nontraumatic normocephalic, EOMI, PE R LA, sclera anicteric.  Skin; benign exam, no jaundice rash or appreciable lesions Extremities; no clubbing cyanosis or edema skin warm and dry Neuro/Psych; alert and oriented x4, grossly nonfocal mood and affect appropriate        Assessment & Plan:   #65 72 year old female with complaint of intermittent solid food dysphagia with episodes occurring a couple of times per month.  She senses the food in her mid chest, has to stop eating to allow the food to traverse and then can resume eating, no episodes requiring regurgitation. She also has intermittent GERD symptoms with belching and burping which occurs very frequently.  She prefers not to use daily medication for the symptoms at present.  Suspect distal esophageal stricture or esophageal ring, less likely dysmotility. Patient  did have remote EGD in 2006 for solid food dysphagia .no stricture was identified, no hiatal hernia commented on, few erosions were noted in the gastric cardia, empiric dilation to 33 [place]oney with good relief of symptoms.  #2 colon cancer screening-negative colonoscopy 2016-indicated for 10-year interval follow-up #3 hypertension #4.  History of migraine headaches  Plan; Patient will be scheduled for EGD with probable esophageal dilation with Dr. Leonides Schanz, and will be established with Dr. Leonides Schanz. She was provided with a antireflux diet and antireflux regimen information sheet. Discussed potential indication for chronic PPI therapy if she is found to have a distal esophageal stricture or evidence of reflux esophagitis. Will hold off on prescribing any medication-this can be decided based on findings at EGD .  If EGD is negative she will likely benefit from empiric dilation  Plan follow-up colonoscopy 2026  Sammuel Cooper PA-C 09/30/2022   Cc: Pincus Sanes, MD

## 2022-09-30 NOTE — Patient Instructions (Signed)
You have been scheduled for an endoscopy. Please follow written instructions given to you at your visit today. If you use inhalers (even only as needed), please bring them with you on the day of your procedure.   Due to recent changes in healthcare laws, you may see the results of your imaging and laboratory studies on MyChart before your provider has had a chance to review them.  We understand that in some cases there may be results that are confusing or concerning to you. Not all laboratory results come back in the same time frame and the provider may be waiting for multiple results in order to interpret others.  Please give [place] 48 hours in order for your provider to thoroughly review all the results before contacting the office for clarification of your results.    _______________________________________________________  If your blood pressure at your visit was 140/90 or greater, please contact your primary care physician to follow up on this.  _______________________________________________________  If you are age 83 or older, your body mass index should be between 23-30. Your Body mass index is 20.8 kg/m. If this is out of the aforementioned range listed, please consider follow up with your Primary Care Provider.  If you are age 62 or younger, your body mass index should be between 19-25. Your Body mass index is 20.8 kg/m. If this is out of the aformentioned range listed, please consider follow up with your Primary Care Provider.   ________________________________________________________  The [facility] providers would like to encourage you to use [facility] to communicate with providers for non-urgent requests or questions.  Due to long hold times on the telephone, sending your provider a message by [facility] may be a faster and more efficient way to get a response.  Please allow 48 business hours for a response.  Please remember that this is for non-urgent requests.   _______________________________________________________   I appreciate the  opportunity to care for you  Thank You   Amy Myrtie Hawk

## 2022-10-14 ENCOUNTER — Ambulatory Visit: Payer: Medicare HMO | Admitting: Family Medicine

## 2022-10-14 NOTE — Progress Notes (Signed)
[name] [address] [place] [address] Phone: [phone] Subjective:   Lindsay [name], am serving as a scribe for Dr. Antoine Primas.  I'm seeing this patient by the request  of:  Pincus Sanes, MD  CC: Neck pain follow-up  [id number]  Lindsay [name] is a 72 y.o. female coming in with complaint of back and neck pain. OMT on 09/01/2022. Patient states same per usual. No new concerns.  Has been more stressful recently.  Patient unfortunately has her husband who will be getting a hip replacement in the near future.  Trying to get a project done we will finish their basement which is causing a lot of of hard labor at the moment.  Medications patient has been prescribed:   Taking:         Reviewed prior external information including notes and imaging from previsou exam, outside providers and external EMR if available.   As well as notes that were available from care everywhere and other healthcare systems.  Past medical history, social, surgical and family history all reviewed in electronic medical record.  No pertanent information unless stated regarding to the chief complaint.   Past Medical History:  Diagnosis Date   COMMON MIGRAINE 09/20/2006   DEPRESSION 06/11/2009   GERD 06/11/2009   Hypertension 04/17/2018   Neck pain    eval with sports medicine in 2016/17   Osteopenia 04/17/2018    No Known Allergies   Review of Systems:  No headache, visual changes, nausea, vomiting, diarrhea, constipation, dizziness, abdominal pain, skin rash, fevers, chills, night sweats, weight loss, swollen lymph nodes, body aches, joint swelling, chest pain, shortness of breath, mood changes. POSITIVE muscle aches  Objective  Blood pressure 128/86, pulse 78, height 5\' 5"  (1.651 m), weight 126 lb (57.2 kg), SpO2 97%.   General: No apparent distress alert and oriented x3 mood and affect normal, dressed appropriately.  HEENT: Pupils equal, extraocular  movements intact  Respiratory: Patient's speak in full sentences and does not appear short of breath  Cardiovascular: No lower extremity edema, non tender, no erythema  Neck exam does have significant limitation with sidebending as well as extension of the neck.  5 out of 5 strength of the upper extremities.  Osteopathic findings C3 flexed rotated and side bent left C5 flexed rotated and side bent right C7 flexed rotated and side bent left T3 extended rotated and side bent right inhaled rib T7 extended rotated and side bent left L2 flexed rotated and side bent right Sacrum right on right       Assessment and Plan:  Degenerative cervical disc Continue tightness and noted.  Still likely responds well to osteopathic manipulation.  Patient has been unable to be relatively active at the moment.  Increase certain activities that I think will be helpful.  Follow-up with me again 6 to 8 weeks.    Nonallopathic problems  Decision today to treat with OMT was based on Physical Exam  After verbal consent patient was treated with HVLA, ME, FPR techniques in cervical, rib, thoracic, lumbar, and sacral  areas  Patient tolerated the procedure well with improvement in symptoms  Patient given exercises, stretches and lifestyle modifications  See medications in patient instructions if given  Patient will follow up in 4-8 weeks      The above documentation has been reviewed and is accurate and complete Judi Saa, DO         Note: This dictation was  prepared with Dragon dictation along with smaller phrase technology. Any transcriptional errors that result from this process are unintentional.

## 2022-10-18 ENCOUNTER — Other Ambulatory Visit (INDEPENDENT_AMBULATORY_CARE_PROVIDER_SITE_OTHER): Payer: Medicare HMO

## 2022-10-18 ENCOUNTER — Encounter: Payer: Self-pay | Admitting: Family Medicine

## 2022-10-18 ENCOUNTER — Ambulatory Visit: Payer: Medicare HMO | Admitting: Family Medicine

## 2022-10-18 VITALS — BP 128/86 | HR 78 | Ht 65.0 in | Wt 126.0 lb

## 2022-10-18 DIAGNOSIS — M9904 Segmental and somatic dysfunction of sacral region: Secondary | ICD-10-CM | POA: Diagnosis not present

## 2022-10-18 DIAGNOSIS — M9902 Segmental and somatic dysfunction of thoracic region: Secondary | ICD-10-CM

## 2022-10-18 DIAGNOSIS — R931 Abnormal findings on diagnostic imaging of heart and coronary circulation: Secondary | ICD-10-CM | POA: Diagnosis not present

## 2022-10-18 DIAGNOSIS — M9908 Segmental and somatic dysfunction of rib cage: Secondary | ICD-10-CM | POA: Diagnosis not present

## 2022-10-18 DIAGNOSIS — M9903 Segmental and somatic dysfunction of lumbar region: Secondary | ICD-10-CM | POA: Diagnosis not present

## 2022-10-18 DIAGNOSIS — M9901 Segmental and somatic dysfunction of cervical region: Secondary | ICD-10-CM | POA: Diagnosis not present

## 2022-10-18 DIAGNOSIS — M5030 Other cervical disc degeneration, unspecified cervical region: Secondary | ICD-10-CM

## 2022-10-18 LAB — HEPATIC FUNCTION PANEL
ALT: 21 U/L (ref 0–35)
AST: 22 U/L (ref 0–37)
Albumin: 4.2 g/dL (ref 3.5–5.2)
Alkaline Phosphatase: 76 U/L (ref 39–117)
Bilirubin, Direct: 0.1 mg/dL (ref 0.0–0.3)
Total Bilirubin: 0.5 mg/dL (ref 0.2–1.2)
Total Protein: 6.7 g/dL (ref 6.0–8.3)

## 2022-10-18 LAB — LIPID PANEL
Cholesterol: 175 mg/dL (ref 0–200)
HDL: 81.1 mg/dL (ref >39.00)
LDL Cholesterol: 73 mg/dL (ref 0–99)
NonHDL: 93.78
Total CHOL/HDL Ratio: 2
Triglycerides: 106 mg/dL (ref 0.0–149.0)
VLDL: 21.2 mg/dL (ref 0.0–40.0)

## 2022-10-18 NOTE — Assessment & Plan Note (Signed)
Continue tightness and noted.  Still likely responds well to osteopathic manipulation.  Patient has been unable to be relatively active at the moment.  Increase certain activities that I think will be helpful.  Follow-up with me again 6 to 8 weeks.

## 2022-10-18 NOTE — Patient Instructions (Signed)
 Good to see you! See you again in 6 weeks 

## 2022-10-20 ENCOUNTER — Encounter: Payer: Medicare HMO | Admitting: Internal Medicine

## 2022-10-21 ENCOUNTER — Telehealth: Payer: Self-pay | Admitting: Internal Medicine

## 2022-10-21 NOTE — Telephone Encounter (Signed)
PT is calling to check status of pre cert for EGD on 7/25. Requesting call back.

## 2022-10-26 ENCOUNTER — Encounter: Payer: Self-pay | Admitting: Internal Medicine

## 2022-10-26 NOTE — Telephone Encounter (Signed)
PT is calling again to find out if her insurance needed a pre  certification for her EGD tomorrow. She is anxious to find out so she cancel accordingly without penalty because she wouldn't be able to afford it. Please advise.

## 2022-10-27 ENCOUNTER — Ambulatory Visit (AMBULATORY_SURGERY_CENTER): Payer: Medicare HMO | Admitting: Internal Medicine

## 2022-10-27 ENCOUNTER — Encounter: Payer: Self-pay | Admitting: Internal Medicine

## 2022-10-27 VITALS — BP 137/82 | HR 62 | Temp 97.7°F | Resp 22 | Ht 65.0 in | Wt 125.0 lb

## 2022-10-27 DIAGNOSIS — K2970 Gastritis, unspecified, without bleeding: Secondary | ICD-10-CM

## 2022-10-27 DIAGNOSIS — K219 Gastro-esophageal reflux disease without esophagitis: Secondary | ICD-10-CM | POA: Diagnosis not present

## 2022-10-27 DIAGNOSIS — R1319 Other dysphagia: Secondary | ICD-10-CM

## 2022-10-27 DIAGNOSIS — K317 Polyp of stomach and duodenum: Secondary | ICD-10-CM | POA: Diagnosis not present

## 2022-10-27 DIAGNOSIS — R1310 Dysphagia, unspecified: Secondary | ICD-10-CM | POA: Diagnosis not present

## 2022-10-27 DIAGNOSIS — K449 Diaphragmatic hernia without obstruction or gangrene: Secondary | ICD-10-CM | POA: Diagnosis not present

## 2022-10-27 DIAGNOSIS — I10 Essential (primary) hypertension: Secondary | ICD-10-CM | POA: Diagnosis not present

## 2022-10-27 DIAGNOSIS — K222 Esophageal obstruction: Secondary | ICD-10-CM | POA: Diagnosis not present

## 2022-10-27 MED ORDER — SODIUM CHLORIDE 0.9 % IV SOLN: 500.0000 mL | Freq: Once | INTRAVENOUS | Status: DC

## 2022-10-27 MED ORDER — OMEPRAZOLE 40 MG PO CPDR
40.0000 mg | DELAYED_RELEASE_CAPSULE | Freq: Every day | ORAL | 3 refills | Status: DC
Start: 2022-10-27 — End: 2023-06-27

## 2022-10-27 NOTE — Progress Notes (Signed)
Vssnnad trans to pacu 

## 2022-10-27 NOTE — Patient Instructions (Addendum)
Await pathology results on biopsies taken today  Resume previous diet and continue present medications - pick up prescription for Omeprazole 40 mg daily from [facility] on [address] [place]  Return to GI clinic in 2-3 months follow up - earliest appointment with Dr. Leonides Schanz is Monday October 28 th, 2024 at 8:50 am    YOU HAD AN ENDOSCOPIC PROCEDURE TODAY AT THE [facility]:   Refer to the procedure report that was given to you for any specific questions about what was found during the examination.  If the procedure report does not answer your questions, please call your gastroenterologist to clarify.  If you requested that your care partner not be given the details of your procedure findings, then the procedure report has been included in a sealed envelope for you to review at your convenience later.  YOU SHOULD EXPECT: Some feelings of bloating in the abdomen. Passage of more gas than usual.  Walking can help get rid of the air that was put into your GI tract during the procedure and reduce the bloating. If you had a lower endoscopy (such as a colonoscopy or flexible sigmoidoscopy) you may notice spotting of blood in your stool or on the toilet paper. If you underwent a bowel prep for your procedure, you may not have a normal bowel movement for a few days.  Please Note:  You might notice some irritation and congestion in your nose or some drainage.  This is from the oxygen used during your procedure.  There is no need for concern and it should clear up in a day or so.  SYMPTOMS TO REPORT IMMEDIATELY:  Following upper endoscopy (EGD)  Vomiting of blood or coffee ground material  New chest pain or pain under the shoulder blades  Painful or persistently difficult swallowing  New shortness of breath  Fever of 100F or higher  Black, tarry-looking stools  For urgent or emergent issues, a gastroenterologist can be reached at any hour by calling [phone]. Do not use MyChart  messaging for urgent concerns.    DIET:  We do recommend a small meal at first, but then you may proceed to your regular diet.  Drink plenty of fluids but you should avoid alcoholic beverages for 24 hours.  ACTIVITY:  You should plan to take it easy for the rest of today and you should NOT DRIVE or use heavy machinery until tomorrow (because of the sedation medicines used during the test).    FOLLOW UP: Our staff will call the number listed on your records the next business day following your procedure.  We will call around 7:15- 8:00 am to check on you and address any questions or concerns that you may have regarding the information given to you following your procedure. If we do not reach you, we will leave a message.     If any biopsies were taken you will be contacted by phone or by letter within the next 1-3 weeks.  Please call us at [phone] if you have not heard about the biopsies in 3 weeks.    SIGNATURES/CONFIDENTIALITY: You and/or your care partner have signed paperwork which will be entered into your electronic medical record.  These signatures attest to the fact that that the information above on your After Visit Summary has been reviewed and is understood.  Full responsibility of the confidentiality of this discharge information lies with you and/or your care-partner.

## 2022-10-27 NOTE — Progress Notes (Signed)
GASTROENTEROLOGY PROCEDURE H&P NOTE   Primary Care Physician: Pincus Sanes, MD    Reason for Procedure:   Dysphagia, GERD  Plan:    EGD  Patient is appropriate for endoscopic procedure(s) in the ambulatory ([facility]) setting.  The nature of the procedure, as well as the risks, benefits, and alternatives were carefully and thoroughly reviewed with the patient. Ample time for discussion and questions allowed. The patient understood, was satisfied, and agreed to proceed.     HPI: Lindsay [name] is a 72 y.o. female who presents for EGD for evaluation of dysphagia, GERD .  Patient was most recently seen in the Gastroenterology Clinic on 09/30/22.  No interval change in medical history since that appointment. Please refer to that note for full details regarding GI history and clinical presentation.   Past Medical History:  Diagnosis Date   COMMON MIGRAINE 09/20/2006   DEPRESSION 06/11/2009   GERD 06/11/2009   Hypertension 04/17/2018   Neck pain    eval with sports medicine in 2016/17   Osteopenia 04/17/2018    Past Surgical History:  Procedure Laterality Date   ABDOMINAL HYSTERECTOMY     BILATERAL OOPHORECTOMY     CHOLECYSTECTOMY     OVARIAN CYST REMOVAL     REPAIR EXTENSOR TENDON HAND     TONSILLECTOMY      Prior to Admission medications   Medication Sig Start Date End Date Taking? Authorizing Provider  amLODipine (NORVASC) 5 MG tablet Take 1 tablet (5 mg total) by mouth daily. 08/01/22  Yes Burns, Bobette Mo, MD  buPROPion (WELLBUTRIN SR) 150 MG 12 hr tablet Take 1 tablet (150 mg total) by mouth 2 (two) times daily. 07/30/22  Yes Burns, Bobette Mo, MD  Cholecalciferol (VITAMIN D-3 PO) Take 1 capsule by mouth daily.   Yes [provider]  Cyanocobalamin (VITAMIN B-12 PO) Take by mouth.   Yes [provider]  docusate sodium (COLACE) 100 MG capsule Take 200 mg by mouth at bedtime.   Yes [provider]  fish oil-omega-3 fatty acids 1000 MG capsule Take 1 g by  mouth daily.   Yes [provider]  hydrocortisone (ANUSOL-HC) 2.5 % rectal cream Place 1 Application rectally 2 (two) times daily. 07/28/22  Yes Burns, Bobette Mo, MD  Inositol Niacinate (NO FLUSH NIACIN) 250 MG TABS Take 250 mg by mouth in the morning.   Yes [provider]  MAGNESIUM PO Take 1 tablet by mouth at bedtime.    Yes [provider]  Misc Natural Products (TART CHERRY ADVANCED PO) Take 1 tablet by mouth at bedtime.    Yes [provider]  rosuvastatin (CRESTOR) 5 MG tablet Take 1 tablet (5 mg total) by mouth 3 (three) times a week. 09/07/22  Yes Burns, Bobette Mo, MD  Turmeric (QC TUMERIC COMPLEX PO) Take by mouth.   Yes [provider]  vitamin E 180 MG (400 UNITS) capsule Take 400 Units by mouth daily.   Yes [provider]  diphenhydramine-acetaminophen (TYLENOL PM) 25-500 MG TABS Take 2 tablets by mouth at bedtime as needed (for sleep).     [provider]  estradiol (ESTRACE) 0.1 MG/GM vaginal cream Place 0.5 g vaginally 2 (two) times a week. Place 0.5g nightly for two weeks then twice a week after 03/12/20   Marguerita Beards, MD  famotidine (PEPCID) 20 MG tablet Take 20 mg by mouth 2 (two) times daily.    [provider]  nystatin ointment (MYCOSTATIN) SMARTSIG:Sparingly Topical Twice  Daily 07/11/22   [provider]  SUMAtriptan (IMITREX) 100 MG tablet TAKE 1/2-1 TABLET BY MOUTH AT ONSET MIGRAINE,MAY REPEAT ONCE 2HOURS IF NEED, MDD 2 07/28/22   Pincus Sanes, MD    Current Outpatient Medications  Medication Sig Dispense Refill   amLODipine (NORVASC) 5 MG tablet Take 1 tablet (5 mg total) by mouth daily. 90 tablet 3   buPROPion (WELLBUTRIN SR) 150 MG 12 hr tablet Take 1 tablet (150 mg total) by mouth 2 (two) times daily. 180 tablet 3   Cholecalciferol (VITAMIN D-3 PO) Take 1 capsule by mouth daily.     Cyanocobalamin (VITAMIN B-12 PO) Take by mouth.     docusate sodium (COLACE) 100 MG capsule Take 200  mg by mouth at bedtime.     fish oil-omega-3 fatty acids 1000 MG capsule Take 1 g by mouth daily.     hydrocortisone (ANUSOL-HC) 2.5 % rectal cream Place 1 Application rectally 2 (two) times daily. 30 g 8   Inositol Niacinate (NO FLUSH NIACIN) 250 MG TABS Take 250 mg by mouth in the morning.     MAGNESIUM PO Take 1 tablet by mouth at bedtime.      Misc Natural Products (TART CHERRY ADVANCED PO) Take 1 tablet by mouth at bedtime.      rosuvastatin (CRESTOR) 5 MG tablet Take 1 tablet (5 mg total) by mouth 3 (three) times a week. 13 tablet 3   Turmeric (QC TUMERIC COMPLEX PO) Take by mouth.     vitamin E 180 MG (400 UNITS) capsule Take 400 Units by mouth daily.     diphenhydramine-acetaminophen (TYLENOL PM) 25-500 MG TABS Take 2 tablets by mouth at bedtime as needed (for sleep).      estradiol (ESTRACE) 0.1 MG/GM vaginal cream Place 0.5 g vaginally 2 (two) times a week. Place 0.5g nightly for two weeks then twice a week after 30 g 11   famotidine (PEPCID) 20 MG tablet Take 20 mg by mouth 2 (two) times daily.     nystatin ointment (MYCOSTATIN) SMARTSIG:Sparingly Topical Twice Daily     SUMAtriptan (IMITREX) 100 MG tablet TAKE 1/2-1 TABLET BY MOUTH AT ONSET MIGRAINE,MAY REPEAT ONCE 2HOURS IF NEED, MDD 2 9 tablet 8   Current Facility-Administered Medications  Medication Dose Route Frequency Provider Last Rate Last Admin   0.9 %  sodium chloride infusion  500 mL Intravenous Once Imogene Burn, MD        Allergies as of 10/27/2022   (No Known Allergies)    Family History  Problem Relation Age of Onset   Dementia Mother    Osteoporosis Mother    Breast cancer Sister 20   Heart attack Brother    Colon cancer Neg Hx    Esophageal cancer Neg Hx    Rectal cancer Neg Hx    Stomach cancer Neg Hx     Social History   Socioeconomic History   Marital status: Married    Spouse name: Not on file   Number of children: Not on file   Years of education: Not on file   Highest education level: Not  on file  Occupational History   Not on file  Tobacco Use   Smoking status: Former    Current packs/day: 0.00    Types: Cigarettes    Quit date: 04/04/2004    Years since quitting: 18.5   Smokeless tobacco: Never  Vaping Use   Vaping status: Never Used  Substance and Sexual Activity   Alcohol use:  Yes    Alcohol/week: 0.0 standard drinks of alcohol    Comment: occ   Drug use: No   Sexual activity: Not Currently  Other Topics Concern   Not on file  Social History Narrative   Not on file   Social Determinants of Health   Financial Resource Strain: Low Risk  (08/23/2022)   Overall Financial Resource Strain (CARDIA)    Difficulty of Paying Living Expenses: Not hard at all  Food Insecurity: No Food Insecurity (08/23/2022)   Hunger Vital Sign    Worried About Running Out of Food in the Last Year: Never true    Ran Out of Food in the Last Year: Never true  Transportation Needs: No Transportation Needs (08/23/2022)   PRAPARE - Administrator, Civil Service (Medical): No    Lack of Transportation (Non-Medical): No  Physical Activity: Sufficiently Active (09/16/2021)   Exercise Vital Sign    Days of Exercise per Week: 3 days    Minutes of Exercise per Session: 60 min  Stress: No Stress Concern Present (08/23/2022)   [employer] of Occupational Health - Occupational Stress Questionnaire    Feeling of Stress : Not at all  Social Connections: Socially Integrated (08/23/2022)   Social Connection and Isolation Panel [NHANES]    Frequency of Communication with Friends and Family: More than three times a week    Frequency of Social Gatherings with Friends and Family: More than three times a week    Attends Religious Services: More than 4 times per year    Active Member of [employer] or Organizations: Yes    Attends Engineer, structural: More than 4 times per year    Marital Status: Married  Catering manager Violence: Not At Risk (08/23/2022)   Humiliation, Afraid, Rape,  and Kick questionnaire    Fear of Current or Ex-Partner: No    Emotionally Abused: No    Physically Abused: No    Sexually Abused: No    Physical Exam: Vital signs in last 24 hours: BP (!) 160/78   Pulse 61   Temp 97.7 F (36.5 C)   Ht 5\' 5"  (1.651 m)   Wt 125 lb (56.7 kg)   SpO2 99%   BMI 20.80 kg/m  GEN: NAD EYE: Sclerae anicteric ENT: MMM CV: Non-tachycardic Pulm: No increased WOB GI: Soft NEURO:  Alert & Oriented   Eulah Pont, MD [facility]   10/27/2022 9:45 AM

## 2022-10-27 NOTE — Op Note (Signed)
[facility] Patient Name: Lindsay [name] Procedure Date: 10/27/2022 10:34 AM MRN: [id number] Endoscopist: Madelyn Brunner [place] , , [id number] Age: 73 Referring MD:  Date of Birth: [birth year] Gender: Female Account #: [id number] Procedure:                Upper GI endoscopy Indications:              Dysphagia, Heartburn Medicines:                Monitored Anesthesia Care Procedure:                Pre-Anesthesia Assessment:                           - Prior to the procedure, a History and Physical                            was performed, and patient medications and                            allergies were reviewed. The patient's tolerance of                            previous anesthesia was also reviewed. The risks                            and benefits of the procedure and the sedation                            options and risks were discussed with the patient.                            All questions were answered, and informed consent                            was obtained. Prior Anticoagulants: The patient has                            taken no anticoagulant or antiplatelet agents. ASA                            Grade Assessment: II - A patient with mild systemic                            disease. After reviewing the risks and benefits,                            the patient was deemed in satisfactory condition to                            undergo the procedure.                           After obtaining informed consent, the endoscope was  passed under direct vision. Throughout the                            procedure, the patient's blood pressure, pulse, and                            oxygen saturations were monitored continuously. The                            GIF [id number] #[id number] was introduced through the                            mouth, and advanced to the second part of duodenum.                            The upper GI endoscopy was  accomplished without                            difficulty. The patient tolerated the procedure                            well. Scope In: Scope Out: Findings:                 Biopsies were taken with a cold forceps in the                            proximal esophagus and in the distal esophagus for                            histology.                           A non-obstructing Schatzki ring was found in the                            distal esophagus. A guidewire was placed and the                            scope was withdrawn. Dilation was performed with a                            Savary dilator with mild resistance at 19 mm.                           A 2-3 cm hiatal hernia was present.                           Localized mild inflammation characterized by                            congestion (edema), erosions and erythema was found                            in the gastric antrum.  Biopsies were taken with a                            cold forceps for histology.                           A single 6 mm sessile polyp with no bleeding and no                            stigmata of recent bleeding was found in the                            gastric body. The polyp was removed with a cold                            snare. Resection and retrieval were complete.                           The examined duodenum was normal. Complications:            No immediate complications. Estimated Blood Loss:     Estimated blood loss was minimal. Impression:               - Non-obstructing Schatzki ring. Dilated.                           - 2-3 cm hiatal hernia.                           - Gastritis. Biopsied.                           - A single gastric polyp. Resected and retrieved.                           - Normal examined duodenum.                           - Biopsies were taken with a cold forceps for                            histology in the proximal esophagus and in the                             distal esophagus. Recommendation:           - Discharge patient to home (with escort).                           - Await pathology results.                           - Start omeprazole 40 mg QD.                           - Return to GI clinic in 2-3 months.                           -  The findings and recommendations were discussed                            with the patient. Dr Particia Lather "[place]" [place],  10/27/2022 11:08:05 AM

## 2022-10-27 NOTE — Progress Notes (Signed)
Called to room to assist during endoscopic procedure.  Patient ID and intended procedure confirmed with present staff. Received instructions for my participation in the procedure from the performing physician.  

## 2022-10-28 ENCOUNTER — Telehealth: Payer: Self-pay | Admitting: *Deleted

## 2022-10-28 NOTE — Telephone Encounter (Signed)
  Follow up Call-     10/27/2022    9:39 AM  Call back number  Post procedure Call Back phone  # [phone]  Permission to leave phone message Yes     Patient questions:  Message left to callus if necessary.

## 2022-11-01 ENCOUNTER — Encounter: Payer: Self-pay | Admitting: Internal Medicine

## 2022-11-03 ENCOUNTER — Encounter: Payer: Self-pay | Admitting: Internal Medicine

## 2022-11-17 ENCOUNTER — Encounter: Payer: Self-pay | Admitting: Internal Medicine

## 2022-11-23 NOTE — Progress Notes (Unsigned)
[name] [address] [place] [address] Phone: [phone] Subjective:   Bruce Donath, am serving as a scribe for Dr. Antoine Primas.  I'm seeing this patient by the request  of:  Pincus Sanes, MD  CC: Neck pain follow-up  [id number]  KENTLEE BIBEAU is a 72 y.o. female coming in with complaint of back and neck pain. OMT 10/18/2022. Patient states that she has been   Medications patient has been prescribed: None  Taking:         Reviewed prior external information including notes and imaging from previsou exam, outside providers and external EMR if available.   As well as notes that were available from care everywhere and other healthcare systems.  Past medical history, social, surgical and family history all reviewed in electronic medical record.  No pertanent information unless stated regarding to the chief complaint.   Past Medical History:  Diagnosis Date   COMMON MIGRAINE 09/20/2006   DEPRESSION 06/11/2009   GERD 06/11/2009   Hypertension 04/17/2018   Neck pain    eval with sports medicine in 2016/17   Osteopenia 04/17/2018    No Known Allergies   Review of Systems:  No headache, visual changes, nausea, vomiting, diarrhea, constipation, dizziness, abdominal pain, skin rash, fevers, chills, night sweats, weight loss, swollen lymph nodes, body aches, joint swelling, chest pain, shortness of breath, mood changes. POSITIVE muscle aches  Objective  Blood pressure 102/62, pulse 64, height 5\' 5"  (1.651 m), weight 122 lb (55.3 kg), SpO2 98%.   General: No apparent distress alert and oriented x3 mood and affect normal, dressed appropriately.  HEENT: Pupils equal, extraocular movements intact  Respiratory: Patient's speak in full sentences and does not appear short of breath  Cardiovascular: No lower extremity edema, non tender, no erythema  Neck exam does have some mild loss of lordosis noted.  Some tenderness to palpation with  sidebending bilaterally.  Crepitus noted.  Patient's low back and is actually thoracolumbar juncture also has more tightness than usual today.  Osteopathic findings  C2 flexed rotated and side bent right C6 flexed rotated and side bent left C7 flexed and rotated side bent right T3 extended rotated and side bent right inhaled rib T9 extended rotated and side bent left T11 extended rotated and side bent left L2 flexed rotated and side bent right Sacrum right on right       Assessment and Plan:  Degenerative cervical disc Has been unfortunately doing more more activity at the moment.  Patient's husband did have a hip replacement.  Discussed with patient to continue to be active where possible but does need to do some stretching as well.  Discussed any medications if needed.  At the moment not taking any.  Responding well to osteopathic manipulation.  Follow-up again in 2 months for further evaluation and treatment.    Nonallopathic problems  Decision today to treat with OMT was based on Physical Exam  After verbal consent patient was treated with HVLA, ME, FPR techniques in cervical, rib, thoracic, lumbar, and sacral  areas  Patient tolerated the procedure well with improvement in symptoms  Patient given exercises, stretches and lifestyle modifications  See medications in patient instructions if given  Patient will follow up in 4-8 weeks     The above documentation has been reviewed and is accurate and complete Judi Saa, DO         Note: This dictation was prepared with Dragon dictation along  with smaller phrase technology. Any transcriptional errors that result from this process are unintentional.

## 2022-11-24 ENCOUNTER — Ambulatory Visit: Payer: Medicare HMO | Admitting: Family Medicine

## 2022-11-24 ENCOUNTER — Other Ambulatory Visit: Payer: Self-pay

## 2022-11-24 ENCOUNTER — Encounter: Payer: Self-pay | Admitting: Internal Medicine

## 2022-11-24 ENCOUNTER — Encounter: Payer: Self-pay | Admitting: Family Medicine

## 2022-11-24 VITALS — BP 102/62 | HR 64 | Ht 65.0 in | Wt 122.0 lb

## 2022-11-24 DIAGNOSIS — M9908 Segmental and somatic dysfunction of rib cage: Secondary | ICD-10-CM | POA: Diagnosis not present

## 2022-11-24 DIAGNOSIS — M9904 Segmental and somatic dysfunction of sacral region: Secondary | ICD-10-CM

## 2022-11-24 DIAGNOSIS — M9903 Segmental and somatic dysfunction of lumbar region: Secondary | ICD-10-CM

## 2022-11-24 DIAGNOSIS — M9901 Segmental and somatic dysfunction of cervical region: Secondary | ICD-10-CM

## 2022-11-24 DIAGNOSIS — M5030 Other cervical disc degeneration, unspecified cervical region: Secondary | ICD-10-CM | POA: Diagnosis not present

## 2022-11-24 DIAGNOSIS — M9902 Segmental and somatic dysfunction of thoracic region: Secondary | ICD-10-CM

## 2022-11-24 NOTE — Assessment & Plan Note (Signed)
Has been unfortunately doing more more activity at the moment.  Patient's husband did have a hip replacement.  Discussed with patient to continue to be active where possible but does need to do some stretching as well.  Discussed any medications if needed.  At the moment not taking any.  Responding well to osteopathic manipulation.  Follow-up again in 2 months for further evaluation and treatment.

## 2022-11-24 NOTE — Patient Instructions (Signed)
Your BMI is just fine See me in 5-6 weeks

## 2022-12-19 ENCOUNTER — Other Ambulatory Visit: Payer: Self-pay | Admitting: Internal Medicine

## 2022-12-22 NOTE — Progress Notes (Signed)
  [name] [address] [place] [address] Phone: [phone] Subjective:   Bruce Donath, am serving as a scribe for Dr. Antoine Primas.  I'm seeing this patient by the request  of:  Pincus Sanes, MD  CC: Back and neck pain follow-up  [id number]  Lindsay [name] is a 72 y.o. female coming in with complaint of back and neck pain. OMT 11/24/2022. Patient states that she is doing better now that husband is able to take care of himself.   Medications patient has been prescribed: None  Taking:         Reviewed prior external information including notes and imaging from previsou exam, outside providers and external EMR if available.   As well as notes that were available from care everywhere and other healthcare systems.  Past medical history, social, surgical and family history all reviewed in electronic medical record.  No pertanent information unless stated regarding to the chief complaint.   Past Medical History:  Diagnosis Date   COMMON MIGRAINE 09/20/2006   DEPRESSION 06/11/2009   GERD 06/11/2009   Hypertension 04/17/2018   Neck pain    eval with sports medicine in 2016/17   Osteopenia 04/17/2018    No Known Allergies   Review of Systems:  No headache, visual changes, nausea, vomiting, diarrhea, constipation, dizziness, abdominal pain, skin rash, fevers, chills, night sweats, weight loss, swollen lymph nodes, body aches, joint swelling, chest pain, shortness of breath, mood changes. POSITIVE muscle aches  Objective  Blood pressure 108/76, pulse 65, height 5\' 5"  (1.651 m), weight 123 lb (55.8 kg), SpO2 98%.   General: No apparent distress alert and oriented x3 mood and affect normal, dressed appropriately.  HEENT: Pupils equal, extraocular movements intact  Respiratory: Patient's speak in full sentences and does not appear short of breath  Cardiovascular: No lower extremity edema, non tender, no erythema  Neck does have some  loss of lordosis noted.  Some tenderness to palpation in the paraspinal musculature.  Neck exam does have limited amount of range of motion in all planes especially with sidebending and extension.  Osteopathic findings  C3 flexed rotated and side bent right C6 flexed rotated and side bent left C7 flexed rotated and side bent right T4 extended rotated and side bent left inhaled rib T7 extended rotated and side bent left L1 flexed rotated and side bent right  Sacrum right on right       Assessment and Plan:  No problem-specific Assessment & Plan notes found for this encounter.    Nonallopathic problems  Decision today to treat with OMT was based on Physical Exam  After verbal consent patient was treated with HVLA, ME, FPR techniques in cervical, rib, thoracic, lumbar, and sacral  areas  Patient tolerated the procedure well with improvement in symptoms  Patient given exercises, stretches and lifestyle modifications  See medications in patient instructions if given  Patient will follow up in 4-8 weeks     The above documentation has been reviewed and is accurate and complete Judi Saa, DO         Note: This dictation was prepared with Dragon dictation along with smaller phrase technology. Any transcriptional errors that result from this process are unintentional.

## 2022-12-26 ENCOUNTER — Encounter: Payer: Self-pay | Admitting: Family Medicine

## 2022-12-26 ENCOUNTER — Ambulatory Visit: Payer: Medicare HMO | Admitting: Family Medicine

## 2022-12-26 VITALS — BP 108/76 | HR 65 | Ht 65.0 in | Wt 123.0 lb

## 2022-12-26 DIAGNOSIS — M9903 Segmental and somatic dysfunction of lumbar region: Secondary | ICD-10-CM

## 2022-12-26 DIAGNOSIS — M9901 Segmental and somatic dysfunction of cervical region: Secondary | ICD-10-CM

## 2022-12-26 DIAGNOSIS — M9902 Segmental and somatic dysfunction of thoracic region: Secondary | ICD-10-CM | POA: Diagnosis not present

## 2022-12-26 DIAGNOSIS — M9904 Segmental and somatic dysfunction of sacral region: Secondary | ICD-10-CM

## 2022-12-26 DIAGNOSIS — M9908 Segmental and somatic dysfunction of rib cage: Secondary | ICD-10-CM | POA: Diagnosis not present

## 2022-12-26 DIAGNOSIS — M5030 Other cervical disc degeneration, unspecified cervical region: Secondary | ICD-10-CM | POA: Diagnosis not present

## 2022-12-26 NOTE — Assessment & Plan Note (Signed)
Known degenerative disc disease noted.  Discussed icing regimen and home exercises, discussed avoiding certain activities still.  Continue the gabapentin.  Increase activity as tolerated.  Follow-up with me again in 6 to 8 weeks otherwise

## 2022-12-26 NOTE — Patient Instructions (Signed)
Good to see you! See you again in 6-8 weeks

## 2023-01-30 ENCOUNTER — Ambulatory Visit: Payer: Medicare HMO | Admitting: Internal Medicine

## 2023-01-30 ENCOUNTER — Encounter: Payer: Self-pay | Admitting: Internal Medicine

## 2023-01-30 VITALS — BP 126/80 | HR 64 | Ht 65.0 in | Wt 125.0 lb

## 2023-01-30 DIAGNOSIS — K449 Diaphragmatic hernia without obstruction or gangrene: Secondary | ICD-10-CM | POA: Diagnosis not present

## 2023-01-30 DIAGNOSIS — R1310 Dysphagia, unspecified: Secondary | ICD-10-CM | POA: Diagnosis not present

## 2023-01-30 DIAGNOSIS — K219 Gastro-esophageal reflux disease without esophagitis: Secondary | ICD-10-CM

## 2023-01-30 NOTE — Patient Instructions (Addendum)
Stop taking Omeprazole   Restart over the counter Pepcid   Follow up as needed  If your blood pressure at your visit was 140/90 or greater, please contact your primary care physician to follow up on this.  _______________________________________________________  If you are age 70 or older, your body mass index should be between 23-30. Your Body mass index is 20.8 kg/m. If this is out of the aforementioned range listed, please consider follow up with your Primary Care Provider.  If you are age 24 or younger, your body mass index should be between 19-25. Your Body mass index is 20.8 kg/m. If this is out of the aformentioned range listed, please consider follow up with your Primary Care Provider.   ________________________________________________________  The [facility] providers would like to encourage you to use [facility] to communicate with providers for non-urgent requests or questions.  Due to long hold times on the telephone, sending your provider a message by [facility] may be a faster and more efficient way to get a response.  Please allow 48 business hours for a response.  Please remember that this is for non-urgent requests.  _______________________________________________________  Thank you for entrusting me with your care and for choosing [facility], Dr. Eulah Pont

## 2023-01-30 NOTE — Progress Notes (Signed)
Chief Complaint: Dysphagia  HPI : 72 year old female with history of migraines, GERD, and HTN presents for follow up of dysphagia  Interval History: After she underwent her EGD in 10/2022, she started her PPI therapy and is currently taking PPI therapy about 3 days a week. She is waking up a lot with headaches (more than when she was not taking her PPI). Her dysphagia has improved after esophageal dilation.  She is also experiencing some mid-morning nausea. Dry cough has gotten better on PPI therapy. She does not typically have nosebleeds, but over the last 3-4 weeks, her right nostril has been more bloody.   Past Medical History:  Diagnosis Date   COMMON MIGRAINE 09/20/2006   DEPRESSION 06/11/2009   GERD 06/11/2009   Hypertension 04/17/2018   Neck pain    eval with sports medicine in 2016/17   Osteopenia 04/17/2018     Past Surgical History:  Procedure Laterality Date   ABDOMINAL HYSTERECTOMY     BILATERAL OOPHORECTOMY     CHOLECYSTECTOMY     OVARIAN CYST REMOVAL     REPAIR EXTENSOR TENDON HAND     TONSILLECTOMY     Family History  Problem Relation Age of Onset   Dementia Mother    Osteoporosis Mother    Breast cancer Sister 33   Heart attack Brother    Colon cancer Neg Hx    Esophageal cancer Neg Hx    Rectal cancer Neg Hx    Stomach cancer Neg Hx    Social History   Tobacco Use   Smoking status: Former    Current packs/day: 0.00    Types: Cigarettes    Quit date: 04/04/2004    Years since quitting: 18.8   Smokeless tobacco: Never  Vaping Use   Vaping status: Never Used  Substance Use Topics   Alcohol use: Yes    Alcohol/week: 0.0 standard drinks of alcohol    Comment: occ   Drug use: No   Current Outpatient Medications  Medication Sig Dispense Refill   amLODipine (NORVASC) 5 MG tablet Take 1 tablet (5 mg total) by mouth daily. 90 tablet 3   buPROPion (WELLBUTRIN SR) 150 MG 12 hr tablet Take 1 tablet (150 mg total) by mouth 2 (two) times daily. 180 tablet 3    Cholecalciferol (VITAMIN D-3 PO) Take 1 capsule by mouth daily.     Cyanocobalamin (VITAMIN B-12 PO) Take by mouth.     diphenhydramine-acetaminophen (TYLENOL PM) 25-500 MG TABS Take 2 tablets by mouth at bedtime as needed (for sleep).      docusate sodium (COLACE) 100 MG capsule Take 200 mg by mouth at bedtime.     estradiol (ESTRACE) 0.1 MG/GM vaginal cream Place 0.5 g vaginally 2 (two) times a week. Place 0.5g nightly for two weeks then twice a week after 30 g 11   famotidine (PEPCID) 20 MG tablet Take 20 mg by mouth 2 (two) times daily.     fish oil-omega-3 fatty acids 1000 MG capsule Take 1 g by mouth daily.     hydrocortisone (ANUSOL-HC) 2.5 % rectal cream Place 1 Application rectally 2 (two) times daily. 30 g 8   Inositol Niacinate (NO FLUSH NIACIN) 250 MG TABS Take 250 mg by mouth in the morning.     MAGNESIUM PO Take 1 tablet by mouth at bedtime.      Misc Natural Products (TART CHERRY ADVANCED PO) Take 1 tablet by mouth at bedtime.      nystatin ointment (MYCOSTATIN)  SMARTSIG:Sparingly Topical Twice Daily     omeprazole (PRILOSEC) 40 MG capsule Take 1 capsule (40 mg total) by mouth daily. 90 capsule 3   rosuvastatin (CRESTOR) 5 MG tablet TAKE 1 TABLET BY MOUTH 3 TIMES A WEEK. 39 tablet 1   SUMAtriptan (IMITREX) 100 MG tablet TAKE 1/2-1 TABLET BY MOUTH AT ONSET MIGRAINE,MAY REPEAT ONCE 2HOURS IF NEED, MDD 2 9 tablet 8   Turmeric (QC TUMERIC COMPLEX PO) Take by mouth.     vitamin E 180 MG (400 UNITS) capsule Take 400 Units by mouth daily.     No current facility-administered medications for this visit.   No Known Allergies  Physical Exam: BP 126/80   Pulse 64   Ht 5\' 5"  (1.651 m)   Wt 125 lb (56.7 kg)   BMI 20.80 kg/m  Constitutional: Pleasant,well-developed, female in no acute distress. HEENT: Normocephalic and atraumatic. Conjunctivae are normal. No scleral icterus. Cardiovascular: Normal rate, regular rhythm.  Pulmonary/chest: Effort normal and breath sounds normal. No  wheezing, rales or rhonchi. Abdominal: Soft, nondistended, nontender. Bowel sounds active throughout.  Extremities: No edema Neurological: Alert and oriented to person place and time. Skin: Skin is warm and dry. No rashes noted. Psychiatric: Normal mood and affect. Behavior is normal.  Labs 07/2022: CBC unremarkable. CMP unremarkable. TSH nml. Vit D nml. Vit B12 nml. HbA1C 5.6%. ANA neg. Folate nml.   Labs 10/2022: LFTs nml.   Colonoscopy 08/14/14: Normal colonoscopy  EGD 10/27/22: - Non- obstructing Schatzki ring. Dilated. - 2- 3 cm hiatal hernia. - Gastritis. Biopsied. - A single gastric polyp. Resected and retrieved. - Normal examined duodenum. - Biopsies were taken with a cold forceps for histology in the proximal esophagus and in the distal esophagus. Path: 1. Surgical [P], gastric - ANTRAL/OXYNTIC MUCOSA WITH NO SIGNIFICANT PATHOLOGY. - NO HELICOBACTER PYLORI ORGANISMS IDENTIFIED ON H&E STAINED SLIDE. 2. Surgical [P], esophageal - SQUAMOUS MUCOSA WITH NO SIGNIFICANT PATHOLOGY. 3. Surgical [P], gastric polyp - FUNDIC GLAND POLYP.  ASSESSMENT AND PLAN: Dysphagia - improved Hiatal hernia GERD Patient presents for follow up of dysphagia, which has improved significantly after esophageal dilation.  She has been taking PPI therapy and has noticed an improvement in her dry cough on this medication.  However she has also been experiencing more headaches and nausea than usual.  Thus we will stop her omeprazole for now and have her restart Pepcid to see if this helps with her headaches and nausea.  If patient has a recurrence in her esophageal dysphagia, could consider restarting her on a different PPI therapy.  Patient states that her nausea is not severe enough to warrant antinausea medications at this time. - Okay to stop omeprazole  - Restart Pepcid 20 mg PRN - Next colonoscopy due in 08/2024 for colon cancer screening - RTC PRN  Eulah Pont, MD  I spent 33 minutes of time, including  in depth chart review, independent review of results as outlined above, communicating results with the patient directly, face-to-face time with the patient, coordinating care, and ordering studies and medications as appropriate, and documentation.

## 2023-02-09 NOTE — Progress Notes (Signed)
  [name] [address] [place] [address] Phone: [phone] Subjective:   [name], am serving as a scribe for Dr. Antoine Primas.  I'm seeing this patient by the request  of:  Pincus Sanes, MD  CC: back and neck pain follow up   [id number]  Lindsay [name] is a 72 y.o. female coming in with complaint of back and neck pain. OMT on 12/26/2022. Patient states same per usual. No new concerns.   Medications patient has been prescribed:   Taking:         Reviewed prior external information including notes and imaging from previsou exam, outside providers and external EMR if available.   As well as notes that were available from care everywhere and other healthcare systems.  Past medical history, social, surgical and family history all reviewed in electronic medical record.  No pertanent information unless stated regarding to the chief complaint.   Past Medical History:  Diagnosis Date   COMMON MIGRAINE 09/20/2006   DEPRESSION 06/11/2009   GERD 06/11/2009   Hypertension 04/17/2018   Neck pain    eval with sports medicine in 2016/17   Osteopenia 04/17/2018    No Known Allergies   Review of Systems:  No headache, visual changes, nausea, vomiting, diarrhea, constipation, dizziness, abdominal pain, skin rash, fevers, chills, night sweats, weight loss, swollen lymph nodes, body aches, joint swelling, chest pain, shortness of breath, mood changes. POSITIVE muscle aches  Objective  Blood pressure 122/82, pulse 68, height 5\' 5"  (1.651 m), weight 126 lb (57.2 kg), SpO2 97%.   General: No apparent distress alert and oriented x3 mood and affect normal, dressed appropriately.  HEENT: Pupils equal, extraocular movements intact  Respiratory: Patient's speak in full sentences and does not appear short of breath  Cardiovascular: No lower extremity edema, non tender, no erythema  MSK:  Back   Osteopathic findings  C3 flexed rotated and  side bent right C7 flexed rotated and side bent right  T3 extended rotated and side bent right inhaled rib T7 extended rotated and side bent left L3 flexed rotated and side bent right Sacrum right on right       Assessment and Plan:  Degenerative cervical disc Degenerative disc disease.  Still has significant tightness noted more on the right side of the neck.  No radicular symptoms but does have some limited range of motion.  Have discussed the possibility of potentially another epidural that could be beneficial.  Discussed icing regimen and home exercises otherwise.  Follow-up with me again in 6 to 8 weeks    Nonallopathic problems  Decision today to treat with OMT was based on Physical Exam  After verbal consent patient was treated with HVLA, ME, FPR techniques in cervical, rib, thoracic, lumbar, and sacral  areas  Patient tolerated the procedure well with improvement in symptoms  Patient given exercises, stretches and lifestyle modifications  See medications in patient instructions if given  Patient will follow up in 4-8 weeks    The above documentation has been reviewed and is accurate and complete Judi Saa, DO          Note: This dictation was prepared with Dragon dictation along with smaller phrase technology. Any transcriptional errors that result from this process are unintentional.

## 2023-02-10 ENCOUNTER — Encounter: Payer: Self-pay | Admitting: Internal Medicine

## 2023-02-13 ENCOUNTER — Ambulatory Visit: Payer: Medicare HMO | Admitting: Family Medicine

## 2023-02-13 ENCOUNTER — Encounter: Payer: Self-pay | Admitting: Family Medicine

## 2023-02-13 VITALS — BP 122/82 | HR 68 | Ht 65.0 in | Wt 126.0 lb

## 2023-02-13 DIAGNOSIS — M5030 Other cervical disc degeneration, unspecified cervical region: Secondary | ICD-10-CM

## 2023-02-13 DIAGNOSIS — M9904 Segmental and somatic dysfunction of sacral region: Secondary | ICD-10-CM

## 2023-02-13 DIAGNOSIS — M9902 Segmental and somatic dysfunction of thoracic region: Secondary | ICD-10-CM | POA: Diagnosis not present

## 2023-02-13 DIAGNOSIS — M9908 Segmental and somatic dysfunction of rib cage: Secondary | ICD-10-CM | POA: Diagnosis not present

## 2023-02-13 DIAGNOSIS — M9901 Segmental and somatic dysfunction of cervical region: Secondary | ICD-10-CM

## 2023-02-13 DIAGNOSIS — M9903 Segmental and somatic dysfunction of lumbar region: Secondary | ICD-10-CM

## 2023-02-13 NOTE — Patient Instructions (Signed)
Good to see you! See you again in 6-8 weeks 

## 2023-02-13 NOTE — Assessment & Plan Note (Signed)
Degenerative disc disease.  Still has significant tightness noted more on the right side of the neck.  No radicular symptoms but does have some limited range of motion.  Have discussed the possibility of potentially another epidural that could be beneficial.  Discussed icing regimen and home exercises otherwise.  Follow-up with me again in 6 to 8 weeks

## 2023-03-13 ENCOUNTER — Encounter: Payer: Self-pay | Admitting: Internal Medicine

## 2023-03-13 ENCOUNTER — Ambulatory Visit (INDEPENDENT_AMBULATORY_CARE_PROVIDER_SITE_OTHER): Payer: Medicare HMO | Admitting: Internal Medicine

## 2023-03-13 VITALS — BP 122/80 | HR 60 | Temp 98.2°F | Ht 65.0 in | Wt 124.0 lb

## 2023-03-13 DIAGNOSIS — I10 Essential (primary) hypertension: Secondary | ICD-10-CM | POA: Diagnosis not present

## 2023-03-13 DIAGNOSIS — J069 Acute upper respiratory infection, unspecified: Secondary | ICD-10-CM | POA: Diagnosis not present

## 2023-03-13 NOTE — Assessment & Plan Note (Signed)
Acute Symptoms likely viral in nature I do not think she has pneumonia Chest pain is musculoskeletal in nature-tender to palpation Continue symptomatic treatment with over-the-counter cold medications, Tylenol/ibuprofen Increase rest and fluids Call if symptoms worsen or do not improve over the next few days

## 2023-03-13 NOTE — Progress Notes (Signed)
Subjective:    Patient ID: Lindsay [name], female    DOB: 07/18/1950, 72 y.o.   MRN: [id number]      HPI Lindsay [name] is here for  Chief Complaint  Patient presents with   Cough    Cough with chest discomfort; Cough started shortly after Thanksgiving; Cough wakes her up at night     She is here for an acute visit for cold symptoms.   Her symptoms started just after thanksgiving.  Some of the kids in her famly have had pna.    She is experiencing worsening cough which concerns her.  She really came today because she is also been experiencing some chest pain-she feels it when she coughs a lot, pushes on her chest or takes a deep breath.  She states some Fatigue, nasal congestion which has improved, runny nose, sneezing, hoarseness, cough that is been primarily dry, but just yesterday there is been a little bit of mucus.  She has had a little mild dizziness.  She has not had any shortness of breath, wheeze or fever.  She has tried taking mucinex DM, nyquil pm,      Medications and allergies reviewed with patient and updated if appropriate.  Current Outpatient Medications on File Prior to Visit  Medication Sig Dispense Refill   amLODipine (NORVASC) 5 MG tablet Take 1 tablet (5 mg total) by mouth daily. 90 tablet 3   buPROPion (WELLBUTRIN SR) 150 MG 12 hr tablet Take 1 tablet (150 mg total) by mouth 2 (two) times daily. 180 tablet 3   Cholecalciferol (VITAMIN D-3 PO) Take 1 capsule by mouth daily.     Cyanocobalamin (VITAMIN B-12 PO) Take by mouth.     diphenhydramine-acetaminophen (TYLENOL PM) 25-500 MG TABS Take 2 tablets by mouth at bedtime as needed (for sleep).      docusate sodium (COLACE) 100 MG capsule Take 200 mg by mouth at bedtime.     estradiol (ESTRACE) 0.1 MG/GM vaginal cream Place 0.5 g vaginally 2 (two) times a week. Place 0.5g nightly for two weeks then twice a week after 30 g 11   famotidine (PEPCID) 20 MG tablet Take 20 mg by mouth 2 (two) times daily.     fish  oil-omega-3 fatty acids 1000 MG capsule Take 1 g by mouth daily.     hydrocortisone (ANUSOL-HC) 2.5 % rectal cream Place 1 Application rectally 2 (two) times daily. 30 g 8   Inositol Niacinate (NO FLUSH NIACIN) 250 MG TABS Take 250 mg by mouth in the morning.     MAGNESIUM PO Take 1 tablet by mouth at bedtime.      Misc Natural Products (TART CHERRY ADVANCED PO) Take 1 tablet by mouth at bedtime.      nystatin ointment (MYCOSTATIN) SMARTSIG:Sparingly Topical Twice Daily     omeprazole (PRILOSEC) 40 MG capsule Take 1 capsule (40 mg total) by mouth daily. 90 capsule 3   rosuvastatin (CRESTOR) 5 MG tablet TAKE 1 TABLET BY MOUTH 3 TIMES A WEEK. 39 tablet 1   SUMAtriptan (IMITREX) 100 MG tablet TAKE 1/2-1 TABLET BY MOUTH AT ONSET MIGRAINE,MAY REPEAT ONCE 2HOURS IF NEED, MDD 2 9 tablet 8   Turmeric (QC TUMERIC COMPLEX PO) Take by mouth.     vitamin E 180 MG (400 UNITS) capsule Take 400 Units by mouth daily.     No current facility-administered medications on file prior to visit.    Review of Systems  Constitutional:  Positive for fatigue. Negative for appetite  change and fever.  HENT:  Positive for congestion (3 nights of it - during day not as bad), rhinorrhea, sneezing, sore throat (one day) and voice change. Negative for ear pain and sinus pain.   Respiratory:  Positive for cough (was dry - some mucus in chest - just started yesterday). Negative for chest tightness, shortness of breath and wheezing.   Cardiovascular:  Positive for chest pain (chest wall tenderness).  Gastrointestinal:  Negative for diarrhea and nausea.  Musculoskeletal:  Negative for myalgias.  Neurological:  Positive for dizziness (mild). Negative for light-headedness and headaches.       Objective:   Vitals:   03/13/23 1445  BP: 122/80  Pulse: 60  Temp: 98.2 F (36.8 C)  SpO2: 98%   BP Readings from Last 3 Encounters:  03/13/23 122/80  02/13/23 122/82  01/30/23 126/80   Wt Readings from Last 3 Encounters:   03/13/23 124 lb (56.2 kg)  02/13/23 126 lb (57.2 kg)  01/30/23 125 lb (56.7 kg)   Body mass index is 20.63 kg/m.    Physical Exam Constitutional:      General: She is not in acute distress.    Appearance: Normal appearance. She is not ill-appearing.  HENT:     Head: Normocephalic and atraumatic.     Right Ear: Tympanic membrane, ear canal and external ear normal.     Left Ear: Tympanic membrane, ear canal and external ear normal.     Mouth/Throat:     Mouth: Mucous membranes are moist.     Pharynx: No oropharyngeal exudate or posterior oropharyngeal erythema.  Eyes:     Conjunctiva/sclera: Conjunctivae normal.  Cardiovascular:     Rate and Rhythm: Normal rate and regular rhythm.  Pulmonary:     Effort: Pulmonary effort is normal. No respiratory distress.     Breath sounds: Normal breath sounds. No wheezing or rales.  Chest:     Chest wall: Tenderness (Left-sided chest tenderness with palpation and deep breaths.) present.  Musculoskeletal:     Cervical back: Neck supple. No tenderness.  Lymphadenopathy:     Cervical: No cervical adenopathy.  Skin:    General: Skin is warm and dry.  Neurological:     Mental Status: She is alert.            Assessment & Plan:    See Problem List for Assessment and Plan of chronic medical problems.

## 2023-03-13 NOTE — Assessment & Plan Note (Signed)
Chronic Blood pressure well controlled Continue amlodipine 5 mg daily 

## 2023-03-13 NOTE — Patient Instructions (Addendum)
   Medications changes include :   None      Return if symptoms worsen or fail to improve.

## 2023-03-16 ENCOUNTER — Encounter: Payer: Self-pay | Admitting: Internal Medicine

## 2023-03-16 DIAGNOSIS — R058 Other specified cough: Secondary | ICD-10-CM

## 2023-03-23 NOTE — Progress Notes (Unsigned)
[name] [address] [place] [address] Phone: [phone] Subjective:   Lindsay [name], am serving as a scribe for Dr. Antoine Primas. I'm seeing this patient by the request  of:  Lindsay Sanes, MD  CC: Back and neck pain  [id number]  Lindsay [name] is a 72 y.o. female coming in with complaint of back and neck pain. OMT 02/13/2023. Patient states that she is not has bad as it was last visit. Did use percussion instrument a few weeks ago due to tightness.   Medications patient has been prescribed: None  Taking:         Reviewed prior external information including notes and imaging from previsou exam, outside providers and external EMR if available.   As well as notes that were available from care everywhere and other healthcare systems.  Past medical history, social, surgical and family history all reviewed in electronic medical record.  No pertanent information unless stated regarding to the chief complaint.   Past Medical History:  Diagnosis Date   COMMON MIGRAINE 09/20/2006   DEPRESSION 06/11/2009   GERD 06/11/2009   Hypertension 04/17/2018   Neck pain    eval with sports medicine in 2016/17   Osteopenia 04/17/2018    No Known Allergies   Review of Systems:  No headache, visual changes, nausea, vomiting, diarrhea, constipation, dizziness, abdominal pain, skin rash, fevers, chills, night sweats, weight loss, swollen lymph nodes, body aches, joint swelling, chest pain, shortness of breath, mood changes. POSITIVE muscle aches  Objective  Blood pressure 122/78, temperature (!) 78 F (25.6 C), height 5\' 5"  (1.651 m), weight 126 lb (57.2 kg).   General: No apparent distress alert and oriented x3 mood and affect normal, dressed appropriately.  HEENT: Pupils equal, extraocular movements intact  Respiratory: Patient's speak in full sentences and does not appear short of breath  Cardiovascular: No lower extremity edema, non tender,  no erythema  Gait relatively normal MSK:  Back does have some loss lordosis noted.  Some tenderness to palpation in the paraspinal musculature.  Tightness with Pearlean Brownie right greater than left.  Neck exam does have significant tightness noted mostly in the C5-C7  Osteopathic findings  C5 flexed rotated and side bent right C6 flexed rotated and side bent left C7 flexed rotated and side bent right T3 extended rotated and side bent left inhaled rib T9 extended rotated and side bent left L2 flexed rotated and side bent right Sacrum right on right       Assessment and Plan:  Degenerative cervical disc Chronic, mild exacerbation noted.  Discussed icing regimen and home exercises, which activities to do and which ones to avoid.  Increase activity slowly.  No significant changes.  No medication changes.  Follow-up again in 6 to 8 weeks    Nonallopathic problems  Decision today to treat with OMT was based on Physical Exam  After verbal consent patient was treated with HVLA, ME, FPR techniques in cervical, rib, thoracic, lumbar, and sacral  areas  Patient tolerated the procedure well with improvement in symptoms  Patient given exercises, stretches and lifestyle modifications  See medications in patient instructions if given  Patient will follow up in 6-8 weeks     The above documentation has been reviewed and is accurate and complete Judi Saa, DO         Note: This dictation was prepared with Dragon dictation along with smaller phrase technology. Any transcriptional errors that result from this  process are unintentional.

## 2023-03-27 ENCOUNTER — Encounter: Payer: Self-pay | Admitting: Family Medicine

## 2023-03-27 ENCOUNTER — Ambulatory Visit: Payer: Medicare HMO | Admitting: Family Medicine

## 2023-03-27 VITALS — BP 122/78 | Temp 78.0°F | Ht 65.0 in | Wt 126.0 lb

## 2023-03-27 DIAGNOSIS — M5030 Other cervical disc degeneration, unspecified cervical region: Secondary | ICD-10-CM

## 2023-03-27 DIAGNOSIS — M9904 Segmental and somatic dysfunction of sacral region: Secondary | ICD-10-CM

## 2023-03-27 DIAGNOSIS — M9902 Segmental and somatic dysfunction of thoracic region: Secondary | ICD-10-CM | POA: Diagnosis not present

## 2023-03-27 DIAGNOSIS — M9908 Segmental and somatic dysfunction of rib cage: Secondary | ICD-10-CM | POA: Diagnosis not present

## 2023-03-27 DIAGNOSIS — M9903 Segmental and somatic dysfunction of lumbar region: Secondary | ICD-10-CM

## 2023-03-27 DIAGNOSIS — M9901 Segmental and somatic dysfunction of cervical region: Secondary | ICD-10-CM | POA: Diagnosis not present

## 2023-03-27 NOTE — Patient Instructions (Signed)
Happy Holidays!!!!

## 2023-03-27 NOTE — Assessment & Plan Note (Signed)
Chronic, mild exacerbation noted.  Discussed icing regimen and home exercises, which activities to do and which ones to avoid.  Increase activity slowly.  No significant changes.  No medication changes.  Follow-up again in 6 to 8 weeks

## 2023-04-03 ENCOUNTER — Ambulatory Visit (INDEPENDENT_AMBULATORY_CARE_PROVIDER_SITE_OTHER): Payer: Medicare HMO

## 2023-04-03 DIAGNOSIS — R058 Other specified cough: Secondary | ICD-10-CM | POA: Diagnosis not present

## 2023-04-03 DIAGNOSIS — R9389 Abnormal findings on diagnostic imaging of other specified body structures: Secondary | ICD-10-CM | POA: Diagnosis not present

## 2023-04-03 DIAGNOSIS — R918 Other nonspecific abnormal finding of lung field: Secondary | ICD-10-CM | POA: Diagnosis not present

## 2023-04-03 DIAGNOSIS — R079 Chest pain, unspecified: Secondary | ICD-10-CM | POA: Diagnosis not present

## 2023-05-09 NOTE — Progress Notes (Signed)
 [name] [address] [place] [address] Phone: [phone] Subjective:   ISusannah Gully, am serving as a scribe for Dr. Arthea Claudene.  I'm seeing this patient by the request  of:  Geofm Glade PARAS, MD  CC: Low back and neck pain follow-up  YEP:[name]  JAHNYA TRINDADE is a 73 y.o. female coming in with complaint of back and neck pain OMT 03/27/23. Patient states doing well. Tweaked the lower back on R side.  Patient does not know what she did specifically but did do a lot more bike riding as well as working in the yard the other day  Medications patient has been prescribed: none  Taking:         Reviewed prior external information including notes and imaging from previsou exam, outside providers and external EMR if available.   As well as notes that were available from care everywhere and other healthcare systems.  Past medical history, social, surgical and family history all reviewed in electronic medical record.  No pertanent information unless stated regarding to the chief complaint.   Past Medical History:  Diagnosis Date   COMMON MIGRAINE 09/20/2006   DEPRESSION 06/11/2009   GERD 06/11/2009   Hypertension 04/17/2018   Neck pain    eval with sports medicine in 2016/17   Osteopenia 04/17/2018    No Known Allergies   Review of Systems:  No headache, visual changes, nausea, vomiting, diarrhea, constipation, dizziness, abdominal pain, skin rash, fevers, chills, night sweats, weight loss, swollen lymph nodes, body aches, joint swelling, chest pain, shortness of breath, mood changes. POSITIVE muscle aches  Objective  Blood pressure 116/78, pulse 69, height 5' 5 (1.651 m), weight 128 lb (58.1 kg), SpO2 96%.   General: No apparent distress alert and oriented x3 mood and affect normal, dressed appropriately.  HEENT: Pupils equal, extraocular movements intact  Respiratory: Patient's speak in full sentences and does not appear short of  breath  Cardiovascular: No lower extremity edema, non tender, no erythema  Gait MSK:  Back does have some loss lordosis.  Tightness noted more in the thoracolumbar junction than usual.  Osteopathic findings  C2 flexed rotated and side bent right C6 flexed rotated and side bent left T4 extended rotated and side bent right inhaled rib T11 extended rotated and side bent left L1 flexed rotated and side bent right Sacrum right on right       Assessment and Plan:  Degenerative cervical disc Degenerative disc disease.  Continuing to try to respond well to osteopathic manipulation.  Discussed with patient about posture and ergonomics.  Patient knows if any worsening symptoms we should consider another possibility of an epidural.  Patient is trying to stay active.  Did have significant tightness noted in the thoracic area as noted today.  Follow-up again in 6 to 8 weeks otherwise.    Nonallopathic problems  Decision today to treat with OMT was based on Physical Exam  After verbal consent patient was treated with HVLA, ME, FPR techniques in cervical, rib, thoracic, lumbar, and sacral  areas  Patient tolerated the procedure well with improvement in symptoms  Patient given exercises, stretches and lifestyle modifications  See medications in patient instructions if given  Patient will follow up in 4-8 weeks    The above documentation has been reviewed and is accurate and complete Masiah Lewing M Fredrich Cory, DO          Note: This dictation was prepared with Dragon dictation along with  smaller phrase technology. Any transcriptional errors that result from this process are unintentional.

## 2023-05-10 ENCOUNTER — Ambulatory Visit: Payer: HMO | Admitting: Family Medicine

## 2023-05-10 ENCOUNTER — Encounter: Payer: Self-pay | Admitting: Family Medicine

## 2023-05-10 VITALS — BP 116/78 | HR 69 | Ht 65.0 in | Wt 128.0 lb

## 2023-05-10 DIAGNOSIS — M9904 Segmental and somatic dysfunction of sacral region: Secondary | ICD-10-CM | POA: Diagnosis not present

## 2023-05-10 DIAGNOSIS — M5030 Other cervical disc degeneration, unspecified cervical region: Secondary | ICD-10-CM

## 2023-05-10 DIAGNOSIS — M9903 Segmental and somatic dysfunction of lumbar region: Secondary | ICD-10-CM | POA: Diagnosis not present

## 2023-05-10 DIAGNOSIS — M9901 Segmental and somatic dysfunction of cervical region: Secondary | ICD-10-CM | POA: Diagnosis not present

## 2023-05-10 DIAGNOSIS — M9908 Segmental and somatic dysfunction of rib cage: Secondary | ICD-10-CM | POA: Diagnosis not present

## 2023-05-10 DIAGNOSIS — M9902 Segmental and somatic dysfunction of thoracic region: Secondary | ICD-10-CM | POA: Diagnosis not present

## 2023-05-10 NOTE — Assessment & Plan Note (Signed)
 Degenerative disc disease.  Continuing to try to respond well to osteopathic manipulation.  Discussed with patient about posture and ergonomics.  Patient knows if any worsening symptoms we should consider another possibility of an epidural.  Patient is trying to stay active.  Did have significant tightness noted in the thoracic area as noted today.  Follow-up again in 6 to 8 weeks otherwise.

## 2023-05-10 NOTE — Patient Instructions (Signed)
 Send me that email Tell Andres Bangs to get up because you have to recuperate See you again in 6-8 weeks

## 2023-05-17 ENCOUNTER — Encounter: Payer: Self-pay | Admitting: Podiatry

## 2023-05-17 ENCOUNTER — Ambulatory Visit: Payer: HMO | Admitting: Podiatry

## 2023-05-17 DIAGNOSIS — L600 Ingrowing nail: Secondary | ICD-10-CM | POA: Diagnosis not present

## 2023-05-17 NOTE — Progress Notes (Signed)
Subjective:   Patient ID: Lindsay [name], female   DOB: 73 y.o.   MRN: [id number]   HPI Patient presents stating that as her nail grew back it is grown back pumped that she is getting pain in the corner of the nailbed.  States that she has tried to trim and soak it without relief of symptoms   ROS      Objective:  Physical Exam  Neurovascular status intact with the patient noted to have incurvated medial border right hallux with mild structural damage to the entire nail bed.  Good digital perfusion noted     Assessment:  Ingrown toenail deformity right hallux medial border with pain     Plan:  H&P reviewed recommended correction of deformity explained procedure risk and patient wants surgery.  At this point I went ahead and had her sign consent form after reading I infiltrated the right big toe 60 mg Xylocaine Marcaine mixture sterile prep done and using sterile instrumentation remove the medial border exposed matrix applied phenol 3 applications 30 seconds followed by alcohol lavage sterile dressing gave instructions on soaks wear dressing 24 hours take it off earlier if throbbing were to occur and encouraged to call with questions

## 2023-05-17 NOTE — Patient Instructions (Signed)

## 2023-06-01 ENCOUNTER — Other Ambulatory Visit: Payer: Self-pay | Admitting: Internal Medicine

## 2023-06-18 ENCOUNTER — Encounter: Payer: Self-pay | Admitting: Podiatry

## 2023-06-27 ENCOUNTER — Ambulatory Visit: Payer: Medicare HMO | Admitting: Neurology

## 2023-06-27 ENCOUNTER — Encounter: Payer: Self-pay | Admitting: Neurology

## 2023-06-27 VITALS — BP 143/84 | HR 90 | Ht 66.0 in | Wt 127.0 lb

## 2023-06-27 DIAGNOSIS — G5603 Carpal tunnel syndrome, bilateral upper limbs: Secondary | ICD-10-CM

## 2023-06-27 DIAGNOSIS — R202 Paresthesia of skin: Secondary | ICD-10-CM

## 2023-06-27 NOTE — Progress Notes (Unsigned)
 [name] [address] [place] [address] Phone: [phone] Subjective:   Bruce Donath, am serving as a scribe for Dr. Antoine Primas.  I'm seeing this patient by the request  of:  Pincus Sanes, MD  CC: Back and neck pain follow-up  [id number]  ARNESIA VINCELETTE is a 73 y.o. female coming in with complaint of back and neck pain. OMT 05/10/2023. Patient states that she has increase in lower back and upper back pain after trimming. Feels pressure point in R side of cervical spine that seems larger than usual.   Medications patient has been prescribed: None  Taking:         Reviewed prior external information including notes and imaging from previsou exam, outside providers and external EMR if available.   As well as notes that were available from care everywhere and other healthcare systems.  Past medical history, social, surgical and family history all reviewed in electronic medical record.  No pertanent information unless stated regarding to the chief complaint.   Past Medical History:  Diagnosis Date   COMMON MIGRAINE 09/20/2006   DEPRESSION 06/11/2009   GERD 06/11/2009   Hypertension 04/17/2018   Neck pain    eval with sports medicine in 2016/17   Osteopenia 04/17/2018    No Known Allergies   Review of Systems:  No , visual changes, nausea, vomiting, diarrhea, constipation, dizziness, abdominal pain, skin rash, fevers, chills, night sweats, weight loss, swollen lymph nodes, body aches, joint swelling, chest pain, shortness of breath, mood changes. POSITIVE muscle aches, headache  Objective  Blood pressure 114/82, pulse 60, height 5\' 6"  (1.676 m), weight 127 lb (57.6 kg), SpO2 98%.   General: No apparent distress alert and oriented x3 mood and affect normal, dressed appropriately.  HEENT: Pupils equal, extraocular movements intact  Respiratory: Patient's speak in full sentences and does not appear short of breath   Cardiovascular: No lower extremity edema, non tender, no erythema  Gait relatively normal MSK:  Back does have loss lordosis noted.  Some tenderness to palpation in the paraspinal musculature.  Osteopathic findings  C2 flexed rotated and side bent right C6 flexed rotated and side bent left T7 flexed rotated right side bent right T3 extended rotated and side bent left inhaled rib T6 extended rotated and side bent left L2 flexed rotated and side bent right Sacrum right on right       Assessment and Plan:  Degenerative cervical disc Chronic, with exacerbation secondary to patient having more pain.  Discussed icing regimen of home exercises, discussed with sidebending.  Discussed increasing activity slowly.  Negative Spurling's noted today.  Follow-up again in 6 to 8 weeks otherwise.  Responds well to osteopathic manipulation.    Nonallopathic problems  Decision today to treat with OMT was based on Physical Exam  After verbal consent patient was treated with HVLA, ME, FPR techniques in cervical, rib, thoracic, lumbar, and sacral  areas  Patient tolerated the procedure well with improvement in symptoms  Patient given exercises, stretches and lifestyle modifications  See medications in patient instructions if given  Patient will follow up in 4-8 weeks     The above documentation has been reviewed and is accurate and complete Judi Saa, DO         Note: This dictation was prepared with Dragon dictation along with smaller phrase technology. Any transcriptional errors that result from this process are unintentional.

## 2023-06-27 NOTE — Progress Notes (Signed)
 Chief Complaint  Patient presents with   New Patient (Initial Visit)    Rm15, alone, PARESTHESIA:       ASSESSMENT AND PLAN  Lindsay [name] is a 73 y.o. female   Bilateral foot paresthesia,  Brisk bilateral upper extremity patellar reflex,  MRI cervical spine October 2023, mild degenerative changes, most prominent at C5-6, no significant canal foraminal narrowing  EMG nerve conduction study January 2024 only showed very mild axonal sensorimotor polyneuropathy, also evidence of carpal tunnel syndromes, left side is moderately severe, right side is mild.,  Her carpal tunnel symptoms has improved after wearing wrist splint,  Extensive Laboratory evaluation were normal or negative from March 2024 CMP, CBC, A1c, RPR, ANA, protein electrophoresis, mild elevation of B12 could due to supplement,  Her symptoms has been stable,continue to observe    DIAGNOSTIC DATA (LABS, IMAGING, TESTING) - I reviewed patient records, labs, notes, testing and imaging myself where available.   MEDICAL HISTORY:  Lindsay [name], is a 73 year old female, seen in request by her primary care physician Dr. Lawerance Bach, Bobette Mo, for evaluation of bilateral foot discomfort, initial evaluation was on September 07, 2021  I reviewed and summarized the referring note. PMHX. HTN Depression, Anxiety Chronic migraine Chronic neck pain, right cervical radicular pain, epidural injection in the past.  She is still very active taking care of her grandchildren without any difficulties, but around beginning of 2023, she noticed bilateral foot discomfort only noticed that when she is barefoot bearing weight, there is no difficulty walking, no persistent sensory loss, did not feel that way when she have her shoes on, she described her feet uncomfortable sensation if she walk on her barefoot, as if her feet has been wrapped in ACE bandage,  She denies significant low back pain, no bowel or bladder incontinence, She does have intermittent  right hand paresthesia, with diagnosis of right carpal tunnel syndrome  UPDATE Jan 04 2022: She noticed spreading of numbness at the bottom of her feet, previously at toes and ball area, now spreading to the heel, she denies gait abnormality, taking care of her grandchildren, denies bowel or bladder incontinence  She does have chronic neck pain, history of radiating pain to right upper extremity in the past,  UPDATE Apr 22 2022: She returns for electrodiagnostic study today, which showed evidence of mild axonal sensory motor polyneuropathy, also evidence of bilateral carpal tunnel syndromes, left side is moderately severe, right side is mild  She continue complains of noticeable bilateral feet paresthesia, but denied gait abnormality  I personally reviewed MRI of cervical spine in October 2023, multilevel degenerative changes, most noticeable at C5-6, with mild canal, moderate left greater than right foraminal narrowing  UPDATE June 27 2023: Patient continue having remittent bilateral toes paresthesia," tingling fuzzy feeling' as if being wrapped by ACE bandage for a long time, but no limitation in her daily activity, denies gait abnormality,  We again reviewed EMG nerve conduction study report in January 2024, well-preserved bilateral peroneal to EDB, tibial motor responses, preserved bilateral tibial, peroneal to EDB, with mildly decreased left sural, superficial sensory response, close normal right sural and superficial peroneal sensory response  Lab from April 2024: B12 more than 1500, normal lipid, liver functional test, CBC, CMP, TSH, A1C 5.6, Vit D 43, ANA, Folic acid, protein electrophoresis, RPR   PHYSICAL EXAM:    Vitals:   06/27/23 1114  Weight: 127 lb (57.6 kg)  Height: 5\' 6"  (1.676 m)     Body mass  index is 20.5 kg/m.  PHYSICAL EXAMNIATION:  Gen: NAD, conversant, well nourised, well groomed                     Cardiovascular: Regular rate rhythm, no peripheral edema,  warm, nontender. Eyes: Conjunctivae clear without exudates or hemorrhage Neck: Supple, no carotid bruits. Pulmonary: Clear to auscultation bilaterally   NEUROLOGICAL EXAM:  MENTAL STATUS: Speech/cognition: Awake, alert, oriented to history taking and casual conversation CRANIAL NERVES: CN II: Visual fields are full to confrontation. Pupils are round equal and briskly reactive to light. CN III, IV, VI: extraocular movement are normal. No ptosis. CN V: Facial sensation is intact to light touch CN VII: Face is symmetric with normal eye closure  CN VIII: Hearing is normal to causal conversation. CN IX, X: Phonation is normal. CN XI: Head turning and shoulder shrug are intact  MOTOR: There is no pronator drift of out-stretched arms. Muscle bulk and tone are normal. Muscle strength is normal.  REFLEXES: Reflexes are 2+ and symmetric at the biceps, triceps, knees, and ankles. Plantar responses are flexor.  SENSORY: Intact to light touch, pinprick and vibratory sensation are intact in fingers and toes.  COORDINATION: There is no trunk or limb dysmetria noted.  GAIT/STANCE: Posture is normal. Gait is steady with normal steps,  Romberg is absent.  REVIEW OF SYSTEMS:  Full 14 system review of systems performed and notable only for as above All other review of systems were negative.   ALLERGIES: No Known Allergies  HOME MEDICATIONS: Current Outpatient Medications  Medication Sig Dispense Refill   amLODipine (NORVASC) 5 MG tablet Take 1 tablet (5 mg total) by mouth daily. 90 tablet 3   buPROPion (WELLBUTRIN SR) 150 MG 12 hr tablet Take 1 tablet (150 mg total) by mouth 2 (two) times daily. 180 tablet 3   Cholecalciferol (VITAMIN D-3 PO) Take 1 capsule by mouth daily.     Cyanocobalamin (VITAMIN B-12 PO) Take by mouth.     diphenhydramine-acetaminophen (TYLENOL PM) 25-500 MG TABS Take 2 tablets by mouth at bedtime as needed (for sleep).      docusate sodium (COLACE) 100 MG capsule  Take 200 mg by mouth at bedtime.     famotidine (PEPCID) 20 MG tablet Take 20 mg by mouth 3 (three) times a week.     fish oil-omega-3 fatty acids 1000 MG capsule Take 1 g by mouth daily.     hydrocortisone (ANUSOL-HC) 2.5 % rectal cream Place 1 Application rectally 2 (two) times daily. 30 g 8   Inositol Niacinate (NO FLUSH NIACIN) 250 MG TABS Take 250 mg by mouth in the morning.     MAGNESIUM PO Take 1 tablet by mouth at bedtime.      Misc Natural Products (TART CHERRY ADVANCED PO) Take 1 tablet by mouth at bedtime.      nystatin ointment (MYCOSTATIN) SMARTSIG:Sparingly Topical Twice Daily     rosuvastatin (CRESTOR) 5 MG tablet TAKE 1 TABLET BY MOUTH 3 TIMES A WEEK. 39 tablet 1   SUMAtriptan (IMITREX) 100 MG tablet TAKE 1/2-1 TABLET BY MOUTH AT ONSET MIGRAINE,MAY REPEAT ONCE 2HOURS IF NEED, MDD 2 9 tablet 8   Turmeric (QC TUMERIC COMPLEX PO) Take by mouth.     vitamin E 180 MG (400 UNITS) capsule Take 400 Units by mouth daily.     No current facility-administered medications for this visit.    PAST MEDICAL HISTORY: Past Medical History:  Diagnosis Date   COMMON MIGRAINE 09/20/2006  DEPRESSION 06/11/2009   GERD 06/11/2009   Hypertension 04/17/2018   Neck pain    eval with sports medicine in 2016/17   Osteopenia 04/17/2018    PAST SURGICAL HISTORY: Past Surgical History:  Procedure Laterality Date   ABDOMINAL HYSTERECTOMY     BILATERAL OOPHORECTOMY     CHOLECYSTECTOMY     OVARIAN CYST REMOVAL     REPAIR EXTENSOR TENDON HAND     TONSILLECTOMY      FAMILY HISTORY: Family History  Problem Relation Age of Onset   Dementia Mother    Osteoporosis Mother    Breast cancer Sister 52   Heart attack Brother    Colon cancer Neg Hx    Esophageal cancer Neg Hx    Rectal cancer Neg Hx    Stomach cancer Neg Hx     SOCIAL HISTORY: Social History   Socioeconomic History   Marital status: Married    Spouse name: Not on file   Number of children: Not on file   Years of education:  Not on file   Highest education level: Not on file  Occupational History   Not on file  Tobacco Use   Smoking status: Former    Current packs/day: 0.00    Types: Cigarettes    Quit date: 04/04/2004    Years since quitting: 19.2   Smokeless tobacco: Never  Vaping Use   Vaping status: Never Used  Substance and Sexual Activity   Alcohol use: Yes    Alcohol/week: 0.0 standard drinks of alcohol    Comment: occ   Drug use: No   Sexual activity: Not Currently  Other Topics Concern   Not on file  Social History Narrative   Not on file   Social Drivers of Health   Financial Resource Strain: Low Risk  (08/23/2022)   Overall Financial Resource Strain (CARDIA)    Difficulty of Paying Living Expenses: Not hard at all  Food Insecurity: No Food Insecurity (08/23/2022)   Hunger Vital Sign    Worried About Running Out of Food in the Last Year: Never true    Ran Out of Food in the Last Year: Never true  Transportation Needs: No Transportation Needs (08/23/2022)   PRAPARE - Administrator, Civil Service (Medical): No    Lack of Transportation (Non-Medical): No  Physical Activity: Sufficiently Active (09/16/2021)   Exercise Vital Sign    Days of Exercise per Week: 3 days    Minutes of Exercise per Session: 60 min  Stress: No Stress Concern Present (08/23/2022)   [employer] of Occupational Health - Occupational Stress Questionnaire    Feeling of Stress : Not at all  Social Connections: Socially Integrated (08/23/2022)   Social Connection and Isolation Panel [NHANES]    Frequency of Communication with Friends and Family: More than three times a week    Frequency of Social Gatherings with Friends and Family: More than three times a week    Attends Religious Services: More than 4 times per year    Active Member of [employer] or Organizations: Yes    Attends Banker Meetings: More than 4 times per year    Marital Status: Married  Catering manager Violence: Not At Risk  (08/23/2022)   Humiliation, Afraid, Rape, and Kick questionnaire    Fear of Current or Ex-Partner: No    Emotionally Abused: No    Physically Abused: No    Sexually Abused: No      Levert Feinstein, M.D. Ph.D.  [facility] [address] [place], [place] [address]- Ph: [phone] Fax: [phone]  CC:  Pincus Sanes, MD [address] [place],  [place] [address]  Pincus Sanes, MD

## 2023-06-28 ENCOUNTER — Ambulatory Visit: Payer: HMO | Admitting: Family Medicine

## 2023-06-28 ENCOUNTER — Encounter: Payer: Self-pay | Admitting: Family Medicine

## 2023-06-28 VITALS — BP 114/82 | HR 60 | Ht 66.0 in | Wt 127.0 lb

## 2023-06-28 DIAGNOSIS — M9902 Segmental and somatic dysfunction of thoracic region: Secondary | ICD-10-CM

## 2023-06-28 DIAGNOSIS — M5030 Other cervical disc degeneration, unspecified cervical region: Secondary | ICD-10-CM

## 2023-06-28 DIAGNOSIS — M9901 Segmental and somatic dysfunction of cervical region: Secondary | ICD-10-CM | POA: Diagnosis not present

## 2023-06-28 DIAGNOSIS — M9908 Segmental and somatic dysfunction of rib cage: Secondary | ICD-10-CM

## 2023-06-28 DIAGNOSIS — M9904 Segmental and somatic dysfunction of sacral region: Secondary | ICD-10-CM | POA: Diagnosis not present

## 2023-06-28 DIAGNOSIS — M9903 Segmental and somatic dysfunction of lumbar region: Secondary | ICD-10-CM

## 2023-06-28 NOTE — Assessment & Plan Note (Signed)
 Chronic, with exacerbation secondary to patient having more pain.  Discussed icing regimen of home exercises, discussed with sidebending.  Discussed increasing activity slowly.  Negative Spurling's noted today.  Follow-up again in 6 to 8 weeks otherwise.  Responds well to osteopathic manipulation.

## 2023-06-28 NOTE — Patient Instructions (Signed)
 See you again in 4-6 weeks Dear Lindsay [name] all the yard work is on you for now

## 2023-07-22 ENCOUNTER — Encounter: Payer: Self-pay | Admitting: Internal Medicine

## 2023-07-29 ENCOUNTER — Other Ambulatory Visit: Payer: Self-pay | Admitting: Internal Medicine

## 2023-07-29 DIAGNOSIS — F418 Other specified anxiety disorders: Secondary | ICD-10-CM

## 2023-08-08 NOTE — Progress Notes (Unsigned)
  Lindsay [name] [address] [place] [address] Phone: [phone] Subjective:   Lindsay [name], am serving as a scribe for Dr. Ronnell Coins.  I'm seeing this patient by the request  of:  Colene Dauphin, MD  CC: Neck and back pain follow-up  [id number]  Lindsay [name] is a 73 y.o. female coming in with complaint of back and neck pain. OMT on 06/28/2023. Patient states doing well. Neck is the focus. No other symptoms.  Medications patient has been prescribed:   Taking:         Reviewed prior external information including notes and imaging from previsou exam, outside providers and external EMR if available.   As well as notes that were available from care everywhere and other healthcare systems.  Past medical history, social, surgical and family history all reviewed in electronic medical record.  No pertanent information unless stated regarding to the chief complaint.   Past Medical History:  Diagnosis Date   COMMON MIGRAINE 09/20/2006   DEPRESSION 06/11/2009   GERD 06/11/2009   Hypertension 04/17/2018   Neck pain    eval with sports [name] in 2016/17   Osteopenia 04/17/2018    No Known Allergies   Review of Systems:  No headache, visual changes, nausea, vomiting, diarrhea, constipation, dizziness, abdominal pain, skin rash, fevers, chills, night sweats, weight loss, swollen lymph nodes, body aches, joint swelling, chest pain, shortness of breath, mood changes. POSITIVE muscle aches  Objective  Blood pressure 124/80, pulse 89, height 5\' 6"  (1.676 m), weight 125 lb (56.7 kg), SpO2 96%.   General: No apparent distress alert and oriented x3 mood and affect normal, dressed appropriately.  HEENT: Pupils equal, extraocular movements intact  Respiratory: Patient's speak in full sentences and does not appear short of breath  Cardiovascular: No lower extremity edema, non tender, no erythema  Gait MSK:    Osteopathic findings  C2 flexed  rotated and side bent right C6 flexed rotated and side bent left T3 extended rotated and side bent right inhaled rib T9 extended rotated and side bent left L2 flexed rotated and side bent right Sacrum right on right       Assessment and Plan:  Degenerative cervical disc Degenerative disc of the cervical spine noted.  Discussed icing regimen and home exercises, discussed which activities to do and which ones to avoid.  Increase activity slowly.  Discussed icing regimen.  Patient wants to hold on any type of epidural but will consider it because patient has responded well to that in the past.  Discussed monitoring ergonomics throughout the day.  Follow-up in 6 to 8 weeks    Nonallopathic problems  Decision today to treat with OMT was based on Physical Exam  After verbal consent patient was treated with HVLA, ME, FPR techniques in cervical, rib, thoracic, lumbar, and sacral  areas  Patient tolerated the procedure well with improvement in symptoms  Patient given exercises, stretches and lifestyle modifications  See medications in patient instructions if given  Patient will follow up in 4-8 weeks    The above documentation has been reviewed and is accurate and complete Lindsay Augenstein M Alekzander Cardell, DO          Note: This dictation was prepared with Dragon dictation along with smaller phrase technology. Any transcriptional errors that result from this process are unintentional.

## 2023-08-09 ENCOUNTER — Ambulatory Visit: Admitting: Family Medicine

## 2023-08-09 VITALS — BP 124/80 | HR 89 | Ht 66.0 in | Wt 125.0 lb

## 2023-08-09 DIAGNOSIS — M9902 Segmental and somatic dysfunction of thoracic region: Secondary | ICD-10-CM

## 2023-08-09 DIAGNOSIS — M9903 Segmental and somatic dysfunction of lumbar region: Secondary | ICD-10-CM | POA: Diagnosis not present

## 2023-08-09 DIAGNOSIS — M9908 Segmental and somatic dysfunction of rib cage: Secondary | ICD-10-CM

## 2023-08-09 DIAGNOSIS — M9904 Segmental and somatic dysfunction of sacral region: Secondary | ICD-10-CM | POA: Diagnosis not present

## 2023-08-09 DIAGNOSIS — M5030 Other cervical disc degeneration, unspecified cervical region: Secondary | ICD-10-CM

## 2023-08-09 DIAGNOSIS — M9901 Segmental and somatic dysfunction of cervical region: Secondary | ICD-10-CM | POA: Diagnosis not present

## 2023-08-09 NOTE — Patient Instructions (Signed)
 Don't tell anybody what you say See you again in 6-8 weeks

## 2023-08-10 ENCOUNTER — Other Ambulatory Visit: Payer: Self-pay | Admitting: Internal Medicine

## 2023-08-10 ENCOUNTER — Encounter: Payer: Self-pay | Admitting: Family Medicine

## 2023-08-10 DIAGNOSIS — Z1231 Encounter for screening mammogram for malignant neoplasm of breast: Secondary | ICD-10-CM

## 2023-08-10 NOTE — Assessment & Plan Note (Signed)
 Degenerative disc of the cervical spine noted.  Discussed icing regimen and home exercises, discussed which activities to do and which ones to avoid.  Increase activity slowly.  Discussed icing regimen.  Patient wants to hold on any type of epidural but will consider it because patient has responded well to that in the past.  Discussed monitoring ergonomics throughout the day.  Follow-up in 6 to 8 weeks

## 2023-08-30 ENCOUNTER — Encounter: Payer: Self-pay | Admitting: Internal Medicine

## 2023-08-30 DIAGNOSIS — I2510 Atherosclerotic heart disease of native coronary artery without angina pectoris: Secondary | ICD-10-CM | POA: Insufficient documentation

## 2023-08-30 DIAGNOSIS — E785 Hyperlipidemia, unspecified: Secondary | ICD-10-CM | POA: Insufficient documentation

## 2023-08-30 NOTE — Progress Notes (Unsigned)
 Subjective:    Patient ID: Lindsay [name], female    DOB: [birth year], 73 y.o.   MRN: [id number]      HPI Lamia is here for a Physical exam and her chronic medical problems.   No changes in health.     December - had dry cough, cxr showed ? COPD which she was concerned about   Medications and allergies reviewed with patient and updated if appropriate.  Current Outpatient Medications on File Prior to Visit  Medication Sig Dispense Refill   amLODipine  (NORVASC ) 5 MG tablet Take 1 tablet (5 mg total) by mouth daily. 90 tablet 3   buPROPion  (WELLBUTRIN  SR) 150 MG 12 hr tablet TAKE 1 TABLET BY MOUTH TWICE A DAY 180 tablet 3   Cholecalciferol (VITAMIN D -3 PO) Take 1 capsule by mouth daily.     Cyanocobalamin  (VITAMIN B-12 PO) Take by mouth.     diphenhydramine-acetaminophen  (TYLENOL  PM) 25-500 MG TABS Take 2 tablets by mouth at bedtime as needed (for sleep).      docusate sodium (COLACE) 100 MG capsule Take 200 mg by mouth at bedtime.     famotidine  (PEPCID ) 20 MG tablet Take 20 mg by mouth 3 (three) times a week.     fish oil-omega-3 fatty acids 1000 MG capsule Take 1 g by mouth daily.     hydrocortisone  (ANUSOL -HC) 2.5 % rectal cream Place 1 Application rectally 2 (two) times daily. 30 g 8   Inositol Niacinate (NO FLUSH NIACIN) 250 MG TABS Take 250 mg by mouth in the morning.     MAGNESIUM PO Take 1 tablet by mouth at bedtime.      Misc Natural Products (TART CHERRY ADVANCED PO) Take 1 tablet by mouth at bedtime.      nystatin ointment (MYCOSTATIN) SMARTSIG:Sparingly Topical Twice Daily     rosuvastatin  (CRESTOR ) 5 MG tablet TAKE 1 TABLET BY MOUTH 3 TIMES A WEEK. 39 tablet 1   SUMAtriptan  (IMITREX ) 100 MG tablet TAKE 1/2-1 TABLET BY MOUTH AT ONSET MIGRAINE,MAY REPEAT ONCE 2HOURS IF NEED, MDD 2 9 tablet 8   Turmeric (QC TUMERIC COMPLEX PO) Take by mouth.     vitamin E 180 MG (400 UNITS) capsule Take 400 Units by mouth daily.     No current facility-administered medications on file  prior to visit.    Review of Systems  Constitutional:  Negative for fever.  Eyes:  Negative for visual disturbance.  Respiratory:  Positive for cough (dry cough - ? PND or gerd). Negative for shortness of breath and wheezing.   Cardiovascular:  Negative for chest pain, palpitations and leg swelling.  Gastrointestinal:  Negative for abdominal pain, blood in stool, constipation and diarrhea.       Gerd controlled - otc medications  Genitourinary:  Negative for dysuria.  Musculoskeletal:  Negative for arthralgias and back pain.  Skin:  Negative for rash.  Neurological:  Positive for headaches (occ - ? from neck, rare migraines). Negative for dizziness and light-headedness.  Psychiatric/Behavioral:  Negative for dysphoric mood. The patient is not nervous/anxious.        Objective:   Vitals:   08/31/23 0818  BP: 126/72  Pulse: 62  Temp: 98 F (36.7 C)  SpO2: 96%   Filed Weights   08/31/23 0818  Weight: 123 lb (55.8 kg)   Body mass index is 19.85 kg/m.  BP Readings from Last 3 Encounters:  08/31/23 126/72  08/09/23 124/80  06/28/23 114/82    Wt Readings from Last  3 Encounters:  08/31/23 123 lb (55.8 kg)  08/09/23 125 lb (56.7 kg)  06/28/23 127 lb (57.6 kg)       Physical Exam Constitutional: She appears well-developed and well-nourished. No distress.  HENT:  Head: Normocephalic and atraumatic.  Right Ear: External ear normal. Normal ear canal and TM Left Ear: External ear normal.  Normal ear canal and TM Mouth/Throat: Oropharynx is clear and moist.  Eyes: Conjunctivae normal.  Neck: Neck supple. No tracheal deviation present. No thyromegaly present.  No carotid bruit  Cardiovascular: Normal rate, regular rhythm and normal heart sounds.   No murmur heard.  No edema. Pulmonary/Chest: Effort normal and breath sounds normal. No respiratory distress. She has no wheezes. She has no rales.  Breast: deferred   Abdominal: Soft. She exhibits no distension. There is no  tenderness.  Lymphadenopathy: She has no cervical adenopathy.  Skin: Skin is warm and dry. She is not diaphoretic.  Psychiatric: She has a normal mood and affect. Her behavior is normal.     Lab Results  Component Value Date   WBC 4.2 07/28/2022   HGB 15.2 (H) 07/28/2022   HCT 44.8 07/28/2022   PLT 315.0 07/28/2022   GLUCOSE 90 07/28/2022   CHOL 175 10/18/2022   TRIG 106.0 10/18/2022   HDL 81.10 10/18/2022   LDLDIRECT 125.5 12/28/2011   LDLCALC 73 10/18/2022   ALT 21 10/18/2022   AST 22 10/18/2022   NA 142 07/28/2022   K 4.1 07/28/2022   CL 103 07/28/2022   CREATININE 0.83 07/28/2022   BUN 13 07/28/2022   CO2 31 07/28/2022   TSH 1.89 07/28/2022   HGBA1C 5.6 07/28/2022         Assessment & Plan:   Physical exam: Screening blood work  ordered Exercise  biking, walking Weight  normal Substance abuse  none   Reviewed recommended immunizations.   Health Maintenance  Topic Date Due   DEXA SCAN  12/17/2022   Medicare Annual Wellness (AWV)  08/23/2023   COVID-19 Vaccine (5 - 2024-25 season) 09/15/2023 (Originally 12/04/2022)   INFLUENZA VACCINE  11/03/2023   Colonoscopy  08/13/2024   MAMMOGRAM  09/21/2024   DTaP/Tdap/Td (4 - Td or Tdap) 08/16/2027   Pneumonia Vaccine 86+ Years old  Completed   Hepatitis C Screening  Completed   Zoster Vaccines- Shingrix  Completed   HPV VACCINES  Aged Out   Meningococcal B Vaccine  Aged Out          See Problem List for Assessment and Plan of chronic medical problems.

## 2023-08-30 NOTE — Patient Instructions (Incomplete)
 Blood work was ordered.       Medications changes include :   None    A bone density scan was the breast center.    Return in about 1 year (around 08/30/2024) for Physical Exam.    Health Maintenance, Female Adopting a healthy lifestyle and getting preventive care are important in promoting health and wellness. Ask your health care provider about: The right schedule for you to have regular tests and exams. Things you can do on your own to prevent diseases and keep yourself healthy. What should I know about diet, weight, and exercise? Eat a healthy diet  Eat a diet that includes plenty of vegetables, fruits, low-fat dairy products, and lean protein. Do not eat a lot of foods that are high in solid fats, added sugars, or sodium. Maintain a healthy weight Body mass index (BMI) is used to identify weight problems. It estimates body fat based on height and weight. Your health care provider can help determine your BMI and help you achieve or maintain a healthy weight. Get regular exercise Get regular exercise. This is one of the most important things you can do for your health. Most adults should: Exercise for at least 150 minutes each week. The exercise should increase your heart rate and make you sweat (moderate-intensity exercise). Do strengthening exercises at least twice a week. This is in addition to the moderate-intensity exercise. Spend less time sitting. Even light physical activity can be beneficial. Watch cholesterol and blood lipids Have your blood tested for lipids and cholesterol at 71 years of age, then have this test every 5 years. Have your cholesterol levels checked more often if: Your lipid or cholesterol levels are high. You are older than 71 years of age. You are at high risk for heart disease. What should I know about cancer screening? Depending on your health history and family history, you may need to have cancer screening at various ages. This may  include screening for: Breast cancer. Cervical cancer. Colorectal cancer. Skin cancer. Lung cancer. What should I know about heart disease, diabetes, and high blood pressure? Blood pressure and heart disease High blood pressure causes heart disease and increases the risk of stroke. This is more likely to develop in people who have high blood pressure readings or are overweight. Have your blood pressure checked: Every 3-5 years if you are 3-74 years of age. Every year if you are 58 years old or older. Diabetes Have regular diabetes screenings. This checks your fasting blood sugar level. Have the screening done: Once every three years after age 42 if you are at a normal weight and have a low risk for diabetes. More often and at a younger age if you are overweight or have a high risk for diabetes. What should I know about preventing infection? Hepatitis B If you have a higher risk for hepatitis B, you should be screened for this virus. Talk with your health care provider to find out if you are at risk for hepatitis B infection. Hepatitis C Testing is recommended for: Everyone born from [birth year] through 1963. Anyone with known risk factors for hepatitis C. Sexually transmitted infections (STIs) Get screened for STIs, including gonorrhea and chlamydia, if: You are sexually active and are younger than 71 years of age. You are older than 71 years of age and your health care provider tells you that you are at risk for this type of infection. Your sexual activity has changed since you were last  screened, and you are at increased risk for chlamydia or gonorrhea. Ask your health care provider if you are at risk. Ask your health care provider about whether you are at high risk for HIV. Your health care provider may recommend a prescription medicine to help prevent HIV infection. If you choose to take medicine to prevent HIV, you should first get tested for HIV. You should then be tested every 3 months  for as long as you are taking the medicine. Pregnancy If you are about to stop having your period (premenopausal) and you may become pregnant, seek counseling before you get pregnant. Take 400 to 800 micrograms (mcg) of folic acid  every day if you become pregnant. Ask for birth control (contraception) if you want to prevent pregnancy. Osteoporosis and menopause Osteoporosis is a disease in which the bones lose minerals and strength with aging. This can result in bone fractures. If you are 75 years old or older, or if you are at risk for osteoporosis and fractures, ask your health care provider if you should: Be screened for bone loss. Take a calcium  or vitamin D  supplement to lower your risk of fractures. Be given hormone replacement therapy (HRT) to treat symptoms of menopause. Follow these instructions at home: Alcohol use Do not drink alcohol if: Your health care provider tells you not to drink. You are pregnant, may be pregnant, or are planning to become pregnant. If you drink alcohol: Limit how much you have to: 0-1 drink a day. Know how much alcohol is in your drink. In the [place] one drink equals one 12 oz bottle of beer (355 mL), one 5 oz glass of wine (148 mL), or one 1 oz glass of hard liquor (44 mL). Lifestyle Do not use any products that contain nicotine or tobacco. These products include cigarettes, chewing tobacco, and vaping devices, such as e-cigarettes. If you need help quitting, ask your health care provider. Do not use street drugs. Do not share needles. Ask your health care provider for help if you need support or information about quitting drugs. General instructions Schedule regular health, dental, and eye exams. Stay current with your vaccines. Tell your health care provider if: You often feel depressed. You have ever been abused or do not feel safe at home. Summary Adopting a healthy lifestyle and getting preventive care are important in promoting health and  wellness. Follow your health care provider's instructions about healthy diet, exercising, and getting tested or screened for diseases. Follow your health care provider's instructions on monitoring your cholesterol and blood pressure. This information is not intended to replace advice given to you by your health care provider. Make sure you discuss any questions you have with your health care provider. Document Revised: 08/10/2020 Document Reviewed: 08/10/2020 Elsevier Patient Education  2024 [employer].

## 2023-08-31 ENCOUNTER — Ambulatory Visit

## 2023-08-31 ENCOUNTER — Ambulatory Visit: Payer: Self-pay | Admitting: Internal Medicine

## 2023-08-31 ENCOUNTER — Ambulatory Visit (INDEPENDENT_AMBULATORY_CARE_PROVIDER_SITE_OTHER): Payer: HMO | Admitting: Internal Medicine

## 2023-08-31 VITALS — BP 126/72 | Ht 66.0 in | Wt 123.0 lb

## 2023-08-31 VITALS — BP 126/72 | HR 62 | Temp 98.0°F | Ht 66.0 in | Wt 123.0 lb

## 2023-08-31 DIAGNOSIS — I10 Essential (primary) hypertension: Secondary | ICD-10-CM

## 2023-08-31 DIAGNOSIS — E2839 Other primary ovarian failure: Secondary | ICD-10-CM

## 2023-08-31 DIAGNOSIS — F418 Other specified anxiety disorders: Secondary | ICD-10-CM | POA: Diagnosis not present

## 2023-08-31 DIAGNOSIS — G43009 Migraine without aura, not intractable, without status migrainosus: Secondary | ICD-10-CM

## 2023-08-31 DIAGNOSIS — M85852 Other specified disorders of bone density and structure, left thigh: Secondary | ICD-10-CM

## 2023-08-31 DIAGNOSIS — M85851 Other specified disorders of bone density and structure, right thigh: Secondary | ICD-10-CM | POA: Diagnosis not present

## 2023-08-31 DIAGNOSIS — Z0000 Encounter for general adult medical examination without abnormal findings: Secondary | ICD-10-CM

## 2023-08-31 DIAGNOSIS — R739 Hyperglycemia, unspecified: Secondary | ICD-10-CM | POA: Diagnosis not present

## 2023-08-31 DIAGNOSIS — I2510 Atherosclerotic heart disease of native coronary artery without angina pectoris: Secondary | ICD-10-CM | POA: Diagnosis not present

## 2023-08-31 DIAGNOSIS — R202 Paresthesia of skin: Secondary | ICD-10-CM | POA: Diagnosis not present

## 2023-08-31 DIAGNOSIS — E785 Hyperlipidemia, unspecified: Secondary | ICD-10-CM | POA: Diagnosis not present

## 2023-08-31 LAB — LIPID PANEL
Cholesterol: 165 mg/dL (ref 0–200)
HDL: 84.6 mg/dL (ref >39.00)
LDL Cholesterol: 66 mg/dL (ref 0–99)
NonHDL: 80.19
Total CHOL/HDL Ratio: 2
Triglycerides: 73 mg/dL (ref 0.0–149.0)
VLDL: 14.6 mg/dL (ref 0.0–40.0)

## 2023-08-31 LAB — CBC WITH DIFFERENTIAL/PLATELET
Basophils Absolute: 0.1 10*3/uL (ref 0.0–0.1)
Basophils Relative: 1.3 % (ref 0.0–3.0)
Eosinophils Absolute: 0.2 10*3/uL (ref 0.0–0.7)
Eosinophils Relative: 5.8 % — ABNORMAL HIGH (ref 0.0–5.0)
HCT: 42.9 % (ref 36.0–46.0)
Hemoglobin: 14.5 g/dL (ref 12.0–15.0)
Lymphocytes Relative: 36.7 % (ref 12.0–46.0)
Lymphs Abs: 1.5 10*3/uL (ref 0.7–4.0)
MCHC: 33.8 g/dL (ref 30.0–36.0)
MCV: 92.7 fl (ref 78.0–100.0)
Monocytes Absolute: 0.4 10*3/uL (ref 0.1–1.0)
Monocytes Relative: 9.6 % (ref 3.0–12.0)
Neutro Abs: 1.9 10*3/uL (ref 1.4–7.7)
Neutrophils Relative %: 46.6 % (ref 43.0–77.0)
Platelets: 305 10*3/uL (ref 150.0–400.0)
RBC: 4.62 Mil/uL (ref 3.87–5.11)
RDW: 12.8 % (ref 11.5–15.5)
WBC: 4 10*3/uL (ref 4.0–10.5)

## 2023-08-31 LAB — COMPREHENSIVE METABOLIC PANEL WITH GFR
ALT: 22 U/L (ref 0–35)
AST: 24 U/L (ref 0–37)
Albumin: 4.5 g/dL (ref 3.5–5.2)
Alkaline Phosphatase: 74 U/L (ref 39–117)
BUN: 12 mg/dL (ref 6–23)
CO2: 31 meq/L (ref 19–32)
Calcium: 9.4 mg/dL (ref 8.4–10.5)
Chloride: 104 meq/L (ref 96–112)
Creatinine, Ser: 0.81 mg/dL (ref 0.40–1.20)
GFR: 73.33 mL/min (ref >60.00)
Glucose, Bld: 93 mg/dL (ref 70–99)
Potassium: 4.1 meq/L (ref 3.5–5.1)
Sodium: 139 meq/L (ref 135–145)
Total Bilirubin: 0.6 mg/dL (ref 0.2–1.2)
Total Protein: 6.9 g/dL (ref 6.0–8.3)

## 2023-08-31 LAB — VITAMIN D 25 HYDROXY (VIT D DEFICIENCY, FRACTURES): VITD: 42.75 ng/mL (ref 30.00–100.00)

## 2023-08-31 LAB — VITAMIN B12: Vitamin B-12: 752 pg/mL (ref 211–911)

## 2023-08-31 LAB — HEMOGLOBIN A1C: Hgb A1c MFr Bld: 5.7 % (ref 4.6–6.5)

## 2023-08-31 LAB — TSH: TSH: 1.35 u[IU]/mL (ref 0.35–5.50)

## 2023-08-31 MED ORDER — ROSUVASTATIN CALCIUM 5 MG PO TABS: 5.0000 mg | ORAL_TABLET | ORAL | 1 refills | Status: DC

## 2023-08-31 MED ORDER — AMLODIPINE BESYLATE 5 MG PO TABS: 5.0000 mg | ORAL_TABLET | Freq: Every day | ORAL | 3 refills | Status: AC

## 2023-08-31 MED ORDER — HYDROCORTISONE (PERIANAL) 2.5 % EX CREA: 1.0000 | TOPICAL_CREAM | Freq: Two times a day (BID) | CUTANEOUS | 8 refills | Status: AC

## 2023-08-31 MED ORDER — BUPROPION HCL ER (SR) 150 MG PO TB12
150.0000 mg | ORAL_TABLET | Freq: Two times a day (BID) | ORAL | 3 refills | Status: AC
Start: 2023-08-31 — End: ?

## 2023-08-31 NOTE — Assessment & Plan Note (Signed)
Chronic ?Infrequent ?Continue Imitrex 50 mg as needed ?

## 2023-08-31 NOTE — Assessment & Plan Note (Signed)
Chronic Blood pressure well-controlled CBC, CMP Continue amlodipine 5 mg daily

## 2023-08-31 NOTE — Assessment & Plan Note (Signed)
Chronic ?Controlled, Stable ?Continue bupropion 150 mg twice daily ?

## 2023-08-31 NOTE — Progress Notes (Signed)
 Subjective:   Lindsay [name] is a 72 y.o. who presents for a Medicare Wellness preventive visit.  As a reminder, Annual Wellness Visits don't include a physical exam, and some assessments may be limited, especially if this visit is performed virtually. We may recommend an in-person follow-up visit with your provider if needed.  Visit Complete: Virtual I connected with  Lindsay [name] on 08/31/23 by a audio enabled telemedicine application and verified that I am speaking with the correct person using two identifiers.  Patient Location: Home  Provider Location: Office/Clinic  I discussed the limitations of evaluation and management by telemedicine. The patient expressed understanding and agreed to proceed.  Vital Signs: Because this visit was a virtual/telehealth visit, some criteria may be missing or patient reported. Any vitals not documented were not able to be obtained and vitals that have been documented are patient reported.  VideoDeclined- This patient declined Librarian, academic. Therefore the visit was completed with audio only.  Persons Participating in Visit: Patient.  AWV Questionnaire: No: Patient Medicare AWV questionnaire was not completed prior to this visit.  Cardiac Risk Factors include: advanced age (>85men, >44 women);dyslipidemia;hypertension     Objective:     Today's Vitals   08/31/23 1053  BP: 126/72  Weight: 123 lb (55.8 kg)  Height: 5\' 6"  (1.676 m)   Body mass index is 19.85 kg/m.     08/31/2023   10:52 AM 08/23/2022    8:47 AM 09/16/2021    4:25 PM  Advanced Directives  Does Patient Have a Medical Advance Directive? Yes Yes No  Type of Estate agent of [place];Living will Healthcare Power of [place];Living will   Copy of Healthcare Power of Attorney in Chart? No - copy requested No - copy requested     Current Medications (verified) Outpatient Encounter Medications as of 08/31/2023  Medication Sig    amLODipine  (NORVASC ) 5 MG tablet Take 1 tablet (5 mg total) by mouth daily.   buPROPion  (WELLBUTRIN  SR) 150 MG 12 hr tablet Take 1 tablet (150 mg total) by mouth 2 (two) times daily.   Cholecalciferol (VITAMIN D -3 PO) Take 1 capsule by mouth daily.   Cyanocobalamin  (VITAMIN B-12 PO) Take by mouth.   diphenhydramine-acetaminophen  (TYLENOL  PM) 25-500 MG TABS Take 2 tablets by mouth at bedtime as needed (for sleep).    docusate sodium (COLACE) 100 MG capsule Take 200 mg by mouth at bedtime.   famotidine  (PEPCID ) 20 MG tablet Take 20 mg by mouth 3 (three) times a week.   fish oil-omega-3 fatty acids 1000 MG capsule Take 1 g by mouth daily.   hydrocortisone  (ANUSOL -HC) 2.5 % rectal cream Place 1 Application rectally 2 (two) times daily.   Inositol Niacinate (NO FLUSH NIACIN) 250 MG TABS Take 250 mg by mouth in the morning.   MAGNESIUM PO Take 1 tablet by mouth at bedtime.    Misc Natural Products (TART CHERRY ADVANCED PO) Take 1 tablet by mouth at bedtime.    nystatin ointment (MYCOSTATIN) SMARTSIG:Sparingly Topical Twice Daily   rosuvastatin  (CRESTOR ) 5 MG tablet Take 1 tablet (5 mg total) by mouth as directed. TAKE 1 TABLET BY MOUTH 3 TIMES A WEEK   SUMAtriptan  (IMITREX ) 100 MG tablet TAKE 1/2-1 TABLET BY MOUTH AT ONSET MIGRAINE,MAY REPEAT ONCE 2HOURS IF NEED, MDD 2   Turmeric (QC TUMERIC COMPLEX PO) Take by mouth.   vitamin E 180 MG (400 UNITS) capsule Take 400 Units by mouth daily.   No facility-administered encounter  medications on file as of 08/31/2023.    Allergies (verified) Patient has no known allergies.   History: Past Medical History:  Diagnosis Date   COMMON MIGRAINE 09/20/2006   DEPRESSION 06/11/2009   GERD 06/11/2009   Hypertension 04/17/2018   Neck pain    eval with sports medicine in 2016/17   Osteopenia 04/17/2018   Past Surgical History:  Procedure Laterality Date   ABDOMINAL HYSTERECTOMY     BILATERAL OOPHORECTOMY     CHOLECYSTECTOMY     OVARIAN CYST REMOVAL      REPAIR EXTENSOR TENDON HAND     TONSILLECTOMY     Family History  Problem Relation Age of Onset   Dementia Mother    Osteoporosis Mother    Breast cancer Sister 23   Heart attack Brother    Colon cancer Neg Hx    Esophageal cancer Neg Hx    Rectal cancer Neg Hx    Stomach cancer Neg Hx    Social History   Socioeconomic History   Marital status: Married    Spouse name: Not on file   Number of children: Not on file   Years of education: Not on file   Highest education level: Not on file  Occupational History   Not on file  Tobacco Use   Smoking status: Former    Current packs/day: 0.00    Types: Cigarettes    Quit date: 04/04/2004    Years since quitting: 19.4    Passive exposure: Past   Smokeless tobacco: Never  Vaping Use   Vaping status: Never Used  Substance and Sexual Activity   Alcohol use: Yes    Alcohol/week: 0.0 standard drinks of alcohol    Comment: occ   Drug use: No   Sexual activity: Yes  Other Topics Concern   Not on file  Social History Narrative   Not on file   Social Drivers of Health   Financial Resource Strain: Low Risk  (08/31/2023)   Overall Financial Resource Strain (CARDIA)    Difficulty of Paying Living Expenses: Not hard at all  Food Insecurity: No Food Insecurity (08/31/2023)   Hunger Vital Sign    Worried About Running Out of Food in the Last Year: Never true    Ran Out of Food in the Last Year: Never true  Transportation Needs: No Transportation Needs (08/31/2023)   PRAPARE - Administrator, Civil Service (Medical): No    Lack of Transportation (Non-Medical): No  Physical Activity: Insufficiently Active (08/31/2023)   Exercise Vital Sign    Days of Exercise per Week: 3 days    Minutes of Exercise per Session: 30 min  Stress: No Stress Concern Present (08/31/2023)   [employer] of Occupational Health - Occupational Stress Questionnaire    Feeling of Stress : Not at all  Social Connections: Socially Integrated  (08/31/2023)   Social Connection and Isolation Panel [NHANES]    Frequency of Communication with Friends and Family: More than three times a week    Frequency of Social Gatherings with Friends and Family: More than three times a week    Attends Religious Services: More than 4 times per year    Active Member of [employer] or Organizations: Yes    Attends Engineer, structural: More than 4 times per year    Marital Status: Married    Tobacco Counseling Counseling given: No    Clinical Intake:  Pre-visit preparation completed: Yes  Pain : No/denies pain  BMI - recorded: 19.85 Nutritional Status: BMI of 19-24  Normal Nutritional Risks: None Diabetes: No  Lab Results  Component Value Date   HGBA1C 5.6 07/28/2022   HGBA1C 5.6 08/15/2017   HGBA1C 5.5 05/22/2013     How often do you need to have someone help you when you read instructions, pamphlets, or other written materials from your doctor or pharmacy?: 1 - Never  Interpreter Needed?: No  Information entered by :: Kandy Orris, CMA   Activities of Daily Living     08/31/2023   10:56 AM  In your present state of health, do you have any difficulty performing the following activities:  Hearing? 0  Vision? 0  Difficulty concentrating or making decisions? 0  Walking or climbing stairs? 0  Dressing or bathing? 0  Doing errands, shopping? 0  Preparing Food and eating ? N  Using the Toilet? N  In the past six months, have you accidently leaked urine? N  Do you have problems with loss of bowel control? N  Managing your Medications? N  Managing your Finances? N  Housekeeping or managing your Housekeeping? N    Patient Care Team: Colene Dauphin, MD as PCP - General (Internal Medicine) Isidro Margo, DO as Consulting Physician (Family Medicine) Drusilla Gerlach, MD as Consulting Physician (Dermatology) Guinevere Lefevre, OD as Referring Physician (Optometry)  Indicate any recent Medical Services you may have  received from other than [facility] providers in the past year (date may be approximate).     Assessment:    This is a routine wellness examination for Lindsay [name].  Hearing/Vision screen Hearing Screening - Comments:: Denies hearing difficulties   Vision Screening - Comments:: Wears rx glasses - up to date with routine eye exams with Dr Wyvonna Heidelberg   Goals Addressed               This Visit's Progress     Patient Stated (pt-stated)        Patient stated she will continue staying active and riding bike       Depression Screen     08/31/2023   10:58 AM 08/31/2023    8:28 AM 08/23/2022    8:43 AM 07/28/2022    9:35 AM 09/16/2021    4:26 PM 09/16/2021    4:23 PM 07/13/2021    8:49 AM  PHQ 2/9 Scores  PHQ - 2 Score 0 0 0 1 0 0 2  PHQ- 9 Score 0 0 0 4   2    Fall Risk     08/31/2023   10:57 AM 08/31/2023    8:27 AM 03/13/2023    2:46 PM 08/23/2022    8:48 AM 07/28/2022    9:35 AM  Fall Risk   Falls in the past year? 0 0 0 0 0  Number falls in past yr: 0 0 0 0 0  Injury with Fall? 0 0 0 0 0  Risk for fall due to : No Fall Risks No Fall Risks No Fall Risks No Fall Risks No Fall Risks  Follow up Falls evaluation completed;Falls prevention discussed Falls evaluation completed Falls evaluation completed Falls prevention discussed;Falls evaluation completed Falls evaluation completed    MEDICARE RISK AT HOME:  Medicare Risk at Home Any stairs in or around the home?: Yes (outside also) If so, are there any without handrails?: No Home free of loose throw rugs in walkways, pet beds, electrical cords, etc?: Yes Adequate lighting in your home to reduce risk of falls?: Yes Life  alert?: No Use of a cane, walker or w/c?: No Grab bars in the bathroom?: Yes Shower chair or bench in shower?: Yes Elevated toilet seat or a handicapped toilet?: Yes  TIMED UP AND GO:  Was the test performed?  No  Cognitive Function: 6CIT completed        08/31/2023   11:02 AM 08/23/2022    8:45 AM  6CIT Screen   What Year? 0 points 0 points  What month? 0 points 0 points  What time? 0 points 0 points  Count back from 20 0 points 0 points  Months in reverse 0 points 0 points  Repeat phrase 2 points 0 points  Total Score 2 points 0 points    Immunizations Immunization History  Administered Date(s) Administered   Fluad Quad(high Dose 65+) 12/25/2018, 02/29/2020, 01/18/2021, 03/18/2022, 02/10/2023   Influenza Split 01/05/2012, 01/16/2013   Influenza Whole 01/25/2007, 01/04/2008   Influenza, High Dose Seasonal PF 02/10/2023   Influenza-Unspecified 01/31/2014, 01/02/2017, 01/09/2018   Meningococcal polysaccharide vaccine (MPSV4) 01/16/2013   PFIZER(Purple Top)SARS-COV-2 Vaccination 04/30/2019, 05/21/2019, 01/01/2020   Pfizer Covid-19 Vaccine Bivalent Booster 45yrs & up 02/15/2021   Pneumococcal Conjugate-13 01/31/2014   Pneumococcal Polysaccharide-23 01/09/2018   Td 08/13/2007   Tdap 08/13/2007, 08/15/2017   Zoster Recombinant(Shingrix) 11/12/2020, 03/12/2021    Screening Tests Health Maintenance  Topic Date Due   DEXA SCAN  12/17/2022   COVID-19 Vaccine (5 - 2024-25 season) 09/15/2023 (Originally 12/04/2022)   INFLUENZA VACCINE  11/03/2023   Colonoscopy  08/13/2024   Medicare Annual Wellness (AWV)  08/30/2024   MAMMOGRAM  09/21/2024   DTaP/Tdap/Td (4 - Td or Tdap) 08/16/2027   Pneumonia Vaccine 36+ Years old  Completed   Hepatitis C Screening  Completed   Zoster Vaccines- Shingrix  Completed   HPV VACCINES  Aged Out   Meningococcal B Vaccine  Aged Out    Health Maintenance  Health Maintenance Due  Topic Date Due   DEXA SCAN  12/17/2022   Health Maintenance Items Addressed:  Mammogram scheduled, DEXA scheduled   Additional Screening:  Vision Screening: Recommended annual ophthalmology exams for early detection of glaucoma and other disorders of the eye.  Dental Screening: Recommended annual dental exams for proper oral hygiene  Community Resource Referral / Chronic Care  Management: CRR required this visit?  No   CCM required this visit?  No   Plan:    I have personally reviewed and noted the following in the patient's chart:   Medical and social history Use of alcohol, tobacco or illicit drugs  Current medications and supplements including opioid prescriptions. Patient is not currently taking opioid prescriptions. Functional ability and status Nutritional status Physical activity Advanced directives List of other physicians Hospitalizations, surgeries, and ER visits in previous 12 months Vitals Screenings to include cognitive, depression, and falls Referrals and appointments  In addition, I have reviewed and discussed with patient certain preventive protocols, quality metrics, and best practice recommendations. A written personalized care plan for preventive services as well as general preventive health recommendations were provided to patient.   Patria Bookbinder, CMA   08/31/2023   After Visit Summary: (MyChart) Due to this being a telephonic visit, the after visit summary with patients personalized plan was offered to patient via MyChart   Notes: Nothing significant to report at this time.

## 2023-08-31 NOTE — Assessment & Plan Note (Signed)
 Chronic LDL goal less than 70 given CAD Regular exercise and healthy diet encouraged Check lipid panel  Continue Crestor  5 mg 3 times a week

## 2023-08-31 NOTE — Patient Instructions (Signed)
 Ms. Lindsay [name] , Thank you for taking time out of your busy schedule to complete your Annual Wellness Visit with me. I enjoyed our conversation and look forward to speaking with you again next year. I, as well as your care team,  appreciate your ongoing commitment to your health goals. Please review the following plan we discussed and let me know if I can assist you in the future. Your Game plan/ To Do List    Referrals: If you haven't heard from the office you've been referred to, please reach out to them at the phone provided.  DEXA and Mammogram tests are scheduled Follow up Visits: Next Medicare AWV with our clinical staff: Patient plans to call in 2026 to schedule   Have you seen your provider in the last 6 months (3 months if uncontrolled diabetes)? Yes Next Office Visit with your provider: Patient plans to call in 026 to schedule  Clinician Recommendations:  Aim for 30 minutes of exercise or brisk walking, 6-8 glasses of water, and 5 servings of fruits and vegetables each day.       This is a list of the screening recommended for you and due dates:  Health Maintenance  Topic Date Due   DEXA scan (bone density measurement)  12/17/2022   COVID-19 Vaccine (5 - 2024-25 season) 09/15/2023*   Flu Shot  11/03/2023   Colon Cancer Screening  08/13/2024   Medicare Annual Wellness Visit  08/30/2024   Mammogram  09/21/2024   DTaP/Tdap/Td vaccine (4 - Td or Tdap) 08/16/2027   Pneumonia Vaccine  Completed   Hepatitis C Screening  Completed   Zoster (Shingles) Vaccine  Completed   HPV Vaccine  Aged Out   Meningitis B Vaccine  Aged Out  *Topic was postponed. The date shown is not the original due date.    Advanced directives: (Copy Requested) Please bring a copy of your health care power of attorney and living will to the office to be added to your chart at your convenience. You can mail to [facility] [address] [place], [place] [address] or email to  [email or web address] Advance Care Planning is important because it:  [x]  Makes sure you receive the medical care that is consistent with your values, goals, and preferences  [x]  It provides guidance to your family and loved ones and reduces their decisional burden about whether or not they are making the right decisions based on your wishes.  Follow the link provided in your after visit summary or read over the paperwork we have mailed to you to help you started getting your Advance Directives in place. If you need assistance in completing these, please reach out to us  so that we can help you!

## 2023-08-31 NOTE — Assessment & Plan Note (Signed)
 Chronic DEXA due-ordered Check vitamin D  level Continue vitamin D  and calcium  supplementation Stressed regular exercise

## 2023-08-31 NOTE — Assessment & Plan Note (Signed)
 Chronic CT CAC 176 in 2024 Continue rosuvastatin  5 mg 3 times a week Blood pressure controlled Stressed healthy diet, regular exercise

## 2023-08-31 NOTE — Assessment & Plan Note (Signed)
 Chronic Lab Results  Component Value Date   HGBA1C 5.6 07/28/2022   Check a1c Low sugar / carb diet Stressed regular exercise

## 2023-08-31 NOTE — Assessment & Plan Note (Signed)
 Chronic Not worse No pain Saw Dr Gracie Lav -she advised holding vitamin B12 just to make sure it was not related to too much B12.  She does not feel that this is neuropathy Currently holding B12 Check B12 level

## 2023-09-05 ENCOUNTER — Encounter: Payer: Self-pay | Admitting: Internal Medicine

## 2023-09-06 ENCOUNTER — Other Ambulatory Visit

## 2023-09-06 ENCOUNTER — Inpatient Hospital Stay: Admission: RE | Admit: 2023-09-06 | Source: Ambulatory Visit

## 2023-09-06 NOTE — Telephone Encounter (Signed)
 Copied from CRM [id number]. Topic: Clinical - Request for Lab/Test Order >> Sep 05, 2023  4:36 PM Lindsay [name] wrote: Reason for CRM: Patient wants to cancel bone scan at [facility] and she wants the [facility] to [facility], she does not think they will allow her to schedule  [phone]

## 2023-09-12 ENCOUNTER — Other Ambulatory Visit: Payer: Self-pay | Admitting: Internal Medicine

## 2023-09-14 ENCOUNTER — Telehealth: Payer: Self-pay | Admitting: Internal Medicine

## 2023-09-14 ENCOUNTER — Ambulatory Visit (INDEPENDENT_AMBULATORY_CARE_PROVIDER_SITE_OTHER)
Admission: RE | Admit: 2023-09-14 | Discharge: 2023-09-14 | Disposition: A | Discharge status: Home / Self Care | Source: Ambulatory Visit | Attending: Internal Medicine | Admitting: Internal Medicine

## 2023-09-14 ENCOUNTER — Inpatient Hospital Stay: Admission: RE | Admit: 2023-09-14 | Source: Ambulatory Visit

## 2023-09-14 ENCOUNTER — Other Ambulatory Visit: Payer: Self-pay

## 2023-09-14 DIAGNOSIS — Z13820 Encounter for screening for osteoporosis: Secondary | ICD-10-CM

## 2023-09-14 DIAGNOSIS — M85852 Other specified disorders of bone density and structure, left thigh: Secondary | ICD-10-CM

## 2023-09-14 NOTE — Telephone Encounter (Signed)
 This has been taken care of already.

## 2023-09-14 NOTE — Telephone Encounter (Signed)
 Copied from CRM [id number]. Topic: General - Other >> Sep 14, 2023  9:42 AM Adonis Hoot wrote: Reason for CRM: Patient is requesting a phone call from [facility] regarding her bone density. She stated that there has been a complete mix up with her being scheduled.

## 2023-09-17 ENCOUNTER — Ambulatory Visit: Payer: Self-pay | Admitting: Internal Medicine

## 2023-09-17 DIAGNOSIS — M810 Age-related osteoporosis without current pathological fracture: Secondary | ICD-10-CM

## 2023-09-22 NOTE — Progress Notes (Unsigned)
  [facility] [address] [place] [address] Phone: [phone] Subjective:    I'm seeing this patient by the request  of:  Colene Dauphin, MD  CC:   [id number]  Lindsay [name] is a 73 y.o. female coming in with complaint of back and neck pain. OMT on 08/09/2023. Patient states   Medications patient has been prescribed:   Taking:         Reviewed prior external information including notes and imaging from previsou exam, outside providers and external EMR if available.   As well as notes that were available from care everywhere and other healthcare systems.  Past medical history, social, surgical and family history all reviewed in electronic medical record.  No pertanent information unless stated regarding to the chief complaint.   Past Medical History:  Diagnosis Date   COMMON MIGRAINE 09/20/2006   DEPRESSION 06/11/2009   GERD 06/11/2009   Hypertension 04/17/2018   Neck pain    eval with sports medicine in 2016/17   Osteopenia 04/17/2018    No Known Allergies   Review of Systems:  No headache, visual changes, nausea, vomiting, diarrhea, constipation, dizziness, abdominal pain, skin rash, fevers, chills, night sweats, weight loss, swollen lymph nodes, body aches, joint swelling, chest pain, shortness of breath, mood changes. POSITIVE muscle aches  Objective  There were no vitals taken for this visit.   General: No apparent distress alert and oriented x3 mood and affect normal, dressed appropriately.  HEENT: Pupils equal, extraocular movements intact  Respiratory: Patient's speak in full sentences and does not appear short of breath  Cardiovascular: No lower extremity edema, non tender, no erythema  Gait MSK:  Back   Osteopathic findings  C2 flexed rotated and side bent right C6 flexed rotated and side bent left T3 extended rotated and side bent right inhaled rib T9 extended rotated and side bent left L2 flexed rotated and  side bent right Sacrum right on right       Assessment and Plan:  No problem-specific Assessment & Plan notes found for this encounter.    Nonallopathic problems  Decision today to treat with OMT was based on Physical Exam  After verbal consent patient was treated with HVLA, ME, FPR techniques in cervical, rib, thoracic, lumbar, and sacral  areas  Patient tolerated the procedure well with improvement in symptoms  Patient given exercises, stretches and lifestyle modifications  See medications in patient instructions if given  Patient will follow up in 4-8 weeks             Note: This dictation was prepared with Dragon dictation along with smaller phrase technology. Any transcriptional errors that result from this process are unintentional.

## 2023-09-27 ENCOUNTER — Ambulatory Visit
Admission: RE | Admit: 2023-09-27 | Discharge: 2023-09-27 | Disposition: A | Discharge status: Home / Self Care | Source: Ambulatory Visit | Attending: Internal Medicine | Admitting: Internal Medicine

## 2023-09-27 ENCOUNTER — Encounter: Payer: Self-pay | Admitting: Family Medicine

## 2023-09-27 ENCOUNTER — Ambulatory Visit: Admitting: Family Medicine

## 2023-09-27 VITALS — BP 122/82 | HR 57 | Ht 66.0 in | Wt 124.0 lb

## 2023-09-27 DIAGNOSIS — M9904 Segmental and somatic dysfunction of sacral region: Secondary | ICD-10-CM

## 2023-09-27 DIAGNOSIS — M9903 Segmental and somatic dysfunction of lumbar region: Secondary | ICD-10-CM

## 2023-09-27 DIAGNOSIS — M9902 Segmental and somatic dysfunction of thoracic region: Secondary | ICD-10-CM | POA: Diagnosis not present

## 2023-09-27 DIAGNOSIS — M5030 Other cervical disc degeneration, unspecified cervical region: Secondary | ICD-10-CM

## 2023-09-27 DIAGNOSIS — Z1231 Encounter for screening mammogram for malignant neoplasm of breast: Secondary | ICD-10-CM

## 2023-09-27 DIAGNOSIS — M9908 Segmental and somatic dysfunction of rib cage: Secondary | ICD-10-CM

## 2023-09-27 DIAGNOSIS — M9901 Segmental and somatic dysfunction of cervical region: Secondary | ICD-10-CM | POA: Diagnosis not present

## 2023-09-27 NOTE — Patient Instructions (Signed)
6-8 week follow up

## 2023-09-27 NOTE — Assessment & Plan Note (Signed)
 Degenerative disc, discussed icing regimen and home exercises, which activities to do in which ones to avoid.  Increase activity slowly.  Discussed icing regimen.  Follow-up again in 6 to 8 weeks

## 2023-09-28 ENCOUNTER — Ambulatory Visit: Admitting: Family Medicine

## 2023-11-10 ENCOUNTER — Encounter: Payer: Self-pay | Admitting: Obstetrics and Gynecology

## 2023-11-10 ENCOUNTER — Ambulatory Visit: Admitting: Obstetrics and Gynecology

## 2023-11-10 VITALS — BP 143/82 | HR 60 | Ht 64.2 in | Wt 122.4 lb

## 2023-11-10 DIAGNOSIS — R350 Frequency of micturition: Secondary | ICD-10-CM

## 2023-11-10 DIAGNOSIS — N812 Incomplete uterovaginal prolapse: Secondary | ICD-10-CM | POA: Diagnosis not present

## 2023-11-10 LAB — POCT URINALYSIS DIP (CLINITEK)
Bilirubin, UA: NEGATIVE
Blood, UA: NEGATIVE
Glucose, UA: NEGATIVE mg/dL
Ketones, POC UA: NEGATIVE mg/dL
Nitrite, UA: NEGATIVE
POC PROTEIN,UA: NEGATIVE
Spec Grav, UA: 1.01 (ref 1.010–1.025)
Urobilinogen, UA: 0.2 U/dL
pH, UA: 7 (ref 5.0–8.0)

## 2023-11-10 NOTE — Addendum Note (Signed)
 Addended by: Eligah Anello N on: 11/10/2023 01:29 PM   Modules accepted: Orders

## 2023-11-10 NOTE — Assessment & Plan Note (Signed)
-   Stage II anterior, Stage II posterior, Stage I apical prolapse - Minimal change from exam 3 years ago. For treatment of pelvic organ prolapse, we discussed options for management including expectant management, conservative management, and surgical management, such as Kegels, a pessary, pelvic floor physical therapy, and specific surgical procedures (A&P repair with sacrospinous fixation). - She feels at this time she wants to expectantly manage but may consider surgery in the near future. Handouts provided for her to review about surgery. We discussed that if she has more bothersome bladder or bowel symptoms then she should come to be reevaluated.

## 2023-11-10 NOTE — Progress Notes (Signed)
 New Patient Evaluation and Consultation  Referring Provider: Geofm Glade PARAS, MD PCP: Geofm Glade PARAS, MD Date of Service: 11/10/2023  SUBJECTIVE Chief Complaint: New Patient (Initial Visit) (Lindsay [name] is a 73 y.o. female is here for prolapse.)  History of Present Illness: Lindsay [name] is a 73 y.o. White or Caucasian female presenting for evaluation of prolapse.  Last seen Dec 2021.    Urinary Symptoms: Does not leak urine.   Day time voids 6-7.  Nocturia: 0-1 times per night to void. Voiding dysfunction:  empties bladder well.  Patient does not use a catheter to empty bladder.  When urinating, patient feels a weak stream  UTIs: 0 UTI's in the last year.   Denies history of blood in urine and kidney or bladder stones   Pelvic Organ Prolapse Symptoms:                  Patient Admits to a feeling of a bulge the vaginal area. It has been present for 3 years.  Patient Admits to seeing a bulge.  This bulge is bothersome. Bulge has been worsening over time.   Bowel Symptom: Bowel movements: 1 time(s) per day Stool consistency: soft  Straining: no.  Splinting: no.  Incomplete evacuation: yes.  Patient Denies accidental bowel leakage / fecal incontinence Bowel regimen: stool softener HM Colonoscopy          Upcoming     Colonoscopy (Every 10 Years) Next due on 08/13/2024    08/14/2014  Done   Only the first 1 history entries have been loaded, but more history exists.                Sexual Function Sexually active: no.  Sexual orientation: heterosexual  Pelvic Pain Denies pelvic pain  Past Medical History:  Past Medical History:  Diagnosis Date   COMMON MIGRAINE 09/20/2006   DEPRESSION 06/11/2009   GERD 06/11/2009   Hypertension 04/17/2018   Neck pain    eval with sports medicine in 2016/17   Osteopenia 04/17/2018     Past Surgical History:   Past Surgical History:  Procedure Laterality Date   ABDOMINAL HYSTERECTOMY     BILATERAL OOPHORECTOMY      CHOLECYSTECTOMY     OVARIAN CYST REMOVAL     REPAIR EXTENSOR TENDON HAND     TONSILLECTOMY       Past OB/GYN History: OB History  Gravida Para Term Preterm AB Living  2 2 2   2   SAB IAB Ectopic Multiple Live Births          # Outcome Date GA Lbr Len/2nd Weight Sex Type Anes PTL Lv  2 Term      Vag-Spont     1 Term      Vag-Spont       Menopausal: Denies vaginal bleeding since menopause     Component Value Date/Time   DIAGPAP  02/25/2020 1213    - Negative for intraepithelial lesion or malignancy (NILM)   HPVHIGH Negative 02/25/2020 1213   ADEQPAP Satisfactory for evaluation. 02/25/2020 1213    Medications: Patient has a current medication list which includes the following prescription(s): amlodipine , bupropion , cholecalciferol, cyanocobalamin , diphenhydramine-acetaminophen , docusate sodium, famotidine , fish oil-omega-3 fatty acids, hydrocortisone , no flush niacin, magnesium, misc natural products, nystatin ointment, rosuvastatin , sumatriptan , turmeric, and vitamin e.   Allergies: Patient has no known allergies.   Social History:  Social History   Tobacco Use   Smoking status: Former    Current packs/day: 0.00  Types: Cigarettes    Quit date: 04/04/2004    Years since quitting: 19.6    Passive exposure: Past   Smokeless tobacco: Never  Vaping Use   Vaping status: Never Used  Substance Use Topics   Alcohol use: Yes    Alcohol/week: 0.0 standard drinks of alcohol    Comment: occ   Drug use: No    Relationship status: married Patient lives with her husband.   Patient is not employed. Regular exercise: Yes:   History of abuse: No  Family History:   Family History  Problem Relation Age of Onset   Dementia Mother    Osteoporosis Mother    Breast cancer Sister 10   Heart attack Brother    Colon cancer Neg Hx    Esophageal cancer Neg Hx    Rectal cancer Neg Hx    Stomach cancer Neg Hx      Review of Systems: Review of Systems  Constitutional:  Negative  for fever, malaise/fatigue and weight loss.  Respiratory:  Positive for cough. Negative for shortness of breath and wheezing.   Cardiovascular:  Negative for chest pain, palpitations and leg swelling.  Gastrointestinal:  Negative for abdominal pain and blood in stool.  Genitourinary:  Negative for dysuria.  Musculoskeletal:  Negative for myalgias.  Skin:  Negative for rash.  Neurological:  Negative for dizziness and headaches.  Endo/Heme/Allergies:  Does not bruise/bleed easily.  Psychiatric/Behavioral:  Positive for depression. The patient is nervous/anxious.      OBJECTIVE Physical Exam: Vitals:   11/10/23 1052 11/10/23 1054  BP: (!) 143/80 (!) 143/82  Pulse: 60 60  Weight:  122 lb 6.4 oz (55.5 kg)  Height:  5' 4.2 (1.631 m)    Physical Exam Vitals reviewed. Exam conducted with a chaperone present.  Constitutional:      General: She is not in acute distress. Pulmonary:     Effort: Pulmonary effort is normal.  Abdominal:     General: There is no distension.     Palpations: Abdomen is soft.     Tenderness: There is no abdominal tenderness. There is no rebound.  Musculoskeletal:        General: No swelling. Normal range of motion.  Skin:    General: Skin is warm and dry.     Findings: No rash.  Neurological:     Mental Status: She is alert and oriented to person, place, and time.  Psychiatric:        Mood and Affect: Mood normal.        Behavior: Behavior normal.      GU / Detailed Urogynecologic Evaluation:  Pelvic Exam: Normal external female genitalia; Bartholin's and Skene's glands normal in appearance; urethral meatus normal in appearance, no urethral masses or discharge.   CST: negative  s/p hysterectomy: Speculum exam reveals normal vaginal mucosa with  atrophy and normal vaginal cuff.  Adnexa no mass, fullness, tenderness.     Pelvic floor strength II/V, puborectalis IV/V external anal sphincter IV/V  Pelvic floor musculature: Right levator non-tender,  Right obturator non-tender, Left levator non-tender, Left obturator non-tender  POP-Q:   POP-Q  0                                            Aa   0  Ba  -6                                              C   4                                            Gh  4.5                                            Pb  6.5                                            tvl   -1.5                                            Ap  -1.5                                            Bp                                                 D      Rectal Exam:  Normal sphincter tone, small distal rectocele, enterocoele not present, no rectal masses, no sign of dyssynergia when asking the patient to bear down.  Post-Void Residual (PVR) by Bladder Scan: In order to evaluate bladder emptying, we discussed obtaining a postvoid residual and patient agreed to this procedure.  Procedure: The ultrasound unit was placed on the patient's abdomen in the suprapubic region after the patient had voided.    Post Void Residual - 11/10/23 1106       Post Void Residual   Post Void Residual 40 mL           Laboratory Results: Lab Results  Component Value Date   COLORU dark yellow 03/11/2020   CLARITYU clear 03/11/2020   GLUCOSEUR Negative 03/11/2020   BILIRUBINUR neg 03/11/2020   KETONESU neg 03/11/2020   SPECGRAV 1.010 03/11/2020   RBCUR neg 03/11/2020   PHUR 6.5 03/11/2020   PROTEINUR Negative 03/11/2020   UROBILINOGEN 0.2 03/11/2020   LEUKOCYTESUR Negative 03/11/2020    Lab Results  Component Value Date   CREATININE 0.81 08/31/2023   CREATININE 0.83 07/28/2022   CREATININE 0.86 07/13/2021    Lab Results  Component Value Date   HGBA1C 5.7 08/31/2023    Lab Results  Component Value Date   HGB 14.5 08/31/2023     ASSESSMENT AND PLAN Ms. Belles is a 73 y.o. with:  1. Uterovaginal prolapse, incomplete     Uterovaginal prolapse,  incomplete Assessment & Plan: - Stage II anterior, Stage II posterior, Stage I apical prolapse - Minimal  change from exam 3 years ago. For treatment of pelvic organ prolapse, we discussed options for management including expectant management, conservative management, and surgical management, such as Kegels, a pessary, pelvic floor physical therapy, and specific surgical procedures (A&P repair with sacrospinous fixation). - She feels at this time she wants to expectantly manage but may consider surgery in the near future. Handouts provided for her to review about surgery. We discussed that if she has more bothersome bladder or bowel symptoms then she should come to be reevaluated.     Return 1 year or sooner if needed   Rosaline LOISE Caper, MD

## 2023-11-24 ENCOUNTER — Ambulatory Visit: Admitting: Family Medicine

## 2023-12-13 DIAGNOSIS — H3582 Retinal ischemia: Secondary | ICD-10-CM | POA: Diagnosis not present

## 2023-12-13 DIAGNOSIS — H52223 Regular astigmatism, bilateral: Secondary | ICD-10-CM | POA: Diagnosis not present

## 2023-12-13 DIAGNOSIS — H43813 Vitreous degeneration, bilateral: Secondary | ICD-10-CM | POA: Diagnosis not present

## 2023-12-13 DIAGNOSIS — H35031 Hypertensive retinopathy, right eye: Secondary | ICD-10-CM | POA: Diagnosis not present

## 2023-12-13 DIAGNOSIS — H524 Presbyopia: Secondary | ICD-10-CM | POA: Diagnosis not present

## 2023-12-13 DIAGNOSIS — H5202 Hypermetropia, left eye: Secondary | ICD-10-CM | POA: Diagnosis not present

## 2023-12-13 DIAGNOSIS — H5211 Myopia, right eye: Secondary | ICD-10-CM | POA: Diagnosis not present

## 2023-12-13 DIAGNOSIS — H2513 Age-related nuclear cataract, bilateral: Secondary | ICD-10-CM | POA: Diagnosis not present

## 2023-12-21 NOTE — Progress Notes (Unsigned)
  [name] [address] [place] [address] Phone: [phone] Subjective:   Lindsay [name], am serving as a scribe for Dr. Arthea [name].  I'm seeing this patient by the request  of:  Lindsay Glade PARAS, MD  CC: Back and neck pain follow-up  YEP:[name]  Lindsay [name] is a 73 y.o. female coming in with complaint of back and neck pain. OMT 09/27/2023. Patient states that she is stiff.   Medications patient has been prescribed: None  Taking:         Reviewed prior external information including notes and imaging from previsou exam, outside providers and external EMR if available.   As well as notes that were available from care everywhere and other healthcare systems.  Past medical history, social, surgical and family history all reviewed in electronic medical record.  No pertanent information unless stated regarding to the chief complaint.   Past Medical History:  Diagnosis Date   COMMON MIGRAINE 09/20/2006   DEPRESSION 06/11/2009   GERD 06/11/2009   Hypertension 04/17/2018   Neck pain    eval with sports medicine in 2016/17   Osteopenia 04/17/2018    No Known Allergies   Review of Systems:  No headache, visual changes, nausea, vomiting, diarrhea, constipation, dizziness, abdominal pain, skin rash, fevers, chills, night sweats, weight loss, swollen lymph nodes, body aches, joint swelling, chest pain, shortness of breath, mood changes. POSITIVE muscle aches  Objective  Blood pressure 110/78, pulse 67, height 5' 4 (1.626 m), weight 123 lb (55.8 kg), SpO2 98%.   General: No apparent distress alert and oriented x3 mood and affect normal, dressed appropriately.  HEENT: Pupils equal, extraocular movements intact  Respiratory: Patient's speak in full sentences and does not appear short of breath  Cardiovascular: No lower extremity edema, non tender, no erythema  Gait MSK:  Neck does have loss of lordosis tightness of sidebending    Osteopathic findings  C2 flexed rotated and side bent right C6 flexed rotated and side bent left C7 F RS right  T3 extended rotated and side bent right inhaled rib T9 extended rotated and side bent left L2 flexed rotated and side bent right L3 F RS right  Sacrum right on right       Assessment and Plan:  No problem-specific Assessment & Plan notes found for this encounter.    Nonallopathic problems  Decision today to treat with OMT was based on Physical Exam  After verbal consent patient was treated with HVLA, ME, FPR techniques in cervical, rib, thoracic, lumbar, and sacral  areas  Patient tolerated the procedure well with improvement in symptoms  Patient given exercises, stretches and lifestyle modifications  See medications in patient instructions if given  Patient will follow up in 4-8 weeks    The above documentation has been reviewed and is accurate and complete Therisa Mennella M Cass Vandermeulen, DO          Note: This dictation was prepared with Dragon dictation along with smaller phrase technology. Any transcriptional errors that result from this process are unintentional.

## 2023-12-22 ENCOUNTER — Ambulatory Visit: Admitting: Family Medicine

## 2023-12-22 ENCOUNTER — Encounter: Payer: Self-pay | Admitting: Family Medicine

## 2023-12-22 VITALS — BP 110/78 | HR 67 | Ht 64.0 in | Wt 123.0 lb

## 2023-12-22 DIAGNOSIS — M9904 Segmental and somatic dysfunction of sacral region: Secondary | ICD-10-CM | POA: Diagnosis not present

## 2023-12-22 DIAGNOSIS — M9903 Segmental and somatic dysfunction of lumbar region: Secondary | ICD-10-CM | POA: Diagnosis not present

## 2023-12-22 DIAGNOSIS — M9908 Segmental and somatic dysfunction of rib cage: Secondary | ICD-10-CM | POA: Diagnosis not present

## 2023-12-22 DIAGNOSIS — M5030 Other cervical disc degeneration, unspecified cervical region: Secondary | ICD-10-CM

## 2023-12-22 DIAGNOSIS — M9901 Segmental and somatic dysfunction of cervical region: Secondary | ICD-10-CM

## 2023-12-22 DIAGNOSIS — M9902 Segmental and somatic dysfunction of thoracic region: Secondary | ICD-10-CM

## 2023-12-22 NOTE — Patient Instructions (Signed)
 Good to see you Sorry you aren't gonna make boys night out See me again in 6-8 weeks

## 2023-12-22 NOTE — Assessment & Plan Note (Signed)
 Discussed HEP discussed which ones to avoid and which ones to do. Posture exercise increase activity slowly.  Follow-up again in 6 to 8 weeks no change in medication has gabapentin  for breakthrough pain and cannot have epidurals unnecessary

## 2024-01-02 ENCOUNTER — Other Ambulatory Visit: Payer: Self-pay | Admitting: Internal Medicine

## 2024-01-02 DIAGNOSIS — G43009 Migraine without aura, not intractable, without status migrainosus: Secondary | ICD-10-CM

## 2024-01-11 ENCOUNTER — Encounter: Payer: Self-pay | Admitting: Internal Medicine

## 2024-01-31 NOTE — Progress Notes (Unsigned)
  [name] [address] [place] [address] Phone: [phone] Subjective:   Lindsay [name], am serving as a scribe for Dr. Arthea [name].  I'Lindsay seeing this patient by the request  of:  Geofm Glade PARAS, MD  CC: Back and neck pain  YEP:[name]  Lindsay [name] is a 73 y.o. female coming in with complaint of back and neck pain. OMT 12/22/2023. Patient states same per usual. No new concerns or symptoms.  Medications patient has been prescribed: None  Taking:         Reviewed prior external information including notes and imaging from previsou exam, outside providers and external EMR if available.   As well as notes that were available from care everywhere and other healthcare systems.  Past medical history, social, surgical and family history all reviewed in electronic medical record.  No pertanent information unless stated regarding to the chief complaint.   Past Medical History:  Diagnosis Date   COMMON MIGRAINE 09/20/2006   DEPRESSION 06/11/2009   GERD 06/11/2009   Hypertension 04/17/2018   Neck pain    eval with sports medicine in 2016/17   Osteopenia 04/17/2018    No Known Allergies   Review of Systems:  No headache, visual changes, nausea, vomiting, diarrhea, constipation, dizziness, abdominal pain, skin rash, fevers, chills, night sweats, weight loss, swollen lymph nodes, body aches, joint swelling, chest pain, shortness of breath, mood changes. POSITIVE muscle aches  Objective  Blood pressure 126/82, pulse 64, height 5' 4 (1.626 Lindsay), weight 129 lb (58.5 kg), SpO2 97%.   General: No apparent distress alert and oriented x3 mood and affect normal, dressed appropriately.  HEENT: Pupils equal, extraocular movements intact  Respiratory: Patient's speak in full sentences and does not appear short of breath  Cardiovascular: No lower extremity edema, non tender, no erythema  Gait relatively normal MSK:  Back mild tightness mostly in the  thoracolumbar juncture. Neck exam does have some loss of lordosis.  Limited sidebending bilaterally.  Negative Spurling's today though.  Osteopathic findings  C2 flexed rotated and side bent right C5 flexed rotated and side bent left C6 flexed rotated and side bent left T5 extended rotated and side bent right inhaled rib T8 extended rotated and side bent left L2 flexed rotated and side bent right Sacrum right on right    Assessment and Plan:  Degenerative cervical disc Known degenerative disc disease.  Discussed the potential for another epidural of her neck.  Patient has responded to a couple of those over the course of time.  Discussed icing regimen and home exercises otherwise.  Increase activity slowly.  Follow-up with me again 6 to 8 weeks otherwise.    Nonallopathic problems  Decision today to treat with OMT was based on Physical Exam  After verbal consent patient was treated with HVLA, ME, FPR techniques in cervical, rib, thoracic, lumbar, and sacral  areas  Patient tolerated the procedure well with improvement in symptoms  Patient given exercises, stretches and lifestyle modifications  See medications in patient instructions if given  Patient will follow up in 4-8 weeks      The above documentation has been reviewed and is accurate and complete Lindsay [name] Lindsay [name], DO        Note: This dictation was prepared with Dragon dictation along with smaller phrase technology. Any transcriptional errors that result from this process are unintentional.

## 2024-02-02 ENCOUNTER — Ambulatory Visit: Admitting: Family Medicine

## 2024-02-02 VITALS — BP 126/82 | HR 64 | Ht 64.0 in | Wt 129.0 lb

## 2024-02-02 DIAGNOSIS — M9902 Segmental and somatic dysfunction of thoracic region: Secondary | ICD-10-CM | POA: Diagnosis not present

## 2024-02-02 DIAGNOSIS — M9901 Segmental and somatic dysfunction of cervical region: Secondary | ICD-10-CM

## 2024-02-02 DIAGNOSIS — M9904 Segmental and somatic dysfunction of sacral region: Secondary | ICD-10-CM | POA: Diagnosis not present

## 2024-02-02 DIAGNOSIS — M9903 Segmental and somatic dysfunction of lumbar region: Secondary | ICD-10-CM | POA: Diagnosis not present

## 2024-02-02 DIAGNOSIS — M5030 Other cervical disc degeneration, unspecified cervical region: Secondary | ICD-10-CM

## 2024-02-02 DIAGNOSIS — M9908 Segmental and somatic dysfunction of rib cage: Secondary | ICD-10-CM | POA: Diagnosis not present

## 2024-02-02 NOTE — Assessment & Plan Note (Signed)
 Known degenerative disc disease.  Discussed the potential for another epidural of her neck.  Patient has responded to a couple of those over the course of time.  Discussed icing regimen and home exercises otherwise.  Increase activity slowly.  Follow-up with me again 6 to 8 weeks otherwise.

## 2024-02-15 DIAGNOSIS — H40013 Open angle with borderline findings, low risk, bilateral: Secondary | ICD-10-CM | POA: Diagnosis not present

## 2024-02-15 DIAGNOSIS — H25043 Posterior subcapsular polar age-related cataract, bilateral: Secondary | ICD-10-CM | POA: Diagnosis not present

## 2024-02-15 DIAGNOSIS — H18413 Arcus senilis, bilateral: Secondary | ICD-10-CM | POA: Diagnosis not present

## 2024-02-15 DIAGNOSIS — H2511 Age-related nuclear cataract, right eye: Secondary | ICD-10-CM | POA: Diagnosis not present

## 2024-02-15 DIAGNOSIS — H2513 Age-related nuclear cataract, bilateral: Secondary | ICD-10-CM | POA: Diagnosis not present

## 2024-03-13 NOTE — Progress Notes (Unsigned)
°  [name] [address] [place] [address] Phone: [phone] Subjective:   Lindsay [name], am serving as a scribe for Dr. Arthea Claudene.  I'm seeing this patient by the request  of:  Geofm Glade PARAS, MD  CC: Neck pain follow-up  YEP:[name]  Lindsay [name] is a 73 y.o. female coming in with complaint of back and neck pain. OMT 02/02/2024. Patient states that she is having increase in L sided neck pain. Had some popping in neck when her shoulder is overhead. Says she has more stress recently.   Medications patient has been prescribed: None  Taking:         Reviewed prior external information including notes and imaging from previsou exam, outside providers and external EMR if available.   As well as notes that were available from care everywhere and other healthcare systems.  Past medical history, social, surgical and family history all reviewed in electronic medical record.  No pertanent information unless stated regarding to the chief complaint.   Past Medical History:  Diagnosis Date   COMMON MIGRAINE 09/20/2006   DEPRESSION 06/11/2009   GERD 06/11/2009   Hypertension 04/17/2018   Neck pain    eval with sports medicine in 2016/17   Osteopenia 04/17/2018    No Known Allergies   Review of Systems:  No headache, visual changes, nausea, vomiting, diarrhea, constipation, dizziness, abdominal pain, skin rash, fevers, chills, night sweats, weight loss, swollen lymph nodes, body aches, joint swelling, chest pain, shortness of breath, mood changes. POSITIVE muscle aches  Objective  Blood pressure 128/82, pulse 67, height 5' 4 (1.626 m), weight 125 lb (56.7 kg), SpO2 95%.   General: No apparent distress alert and oriented x3 mood and affect normal, dressed appropriately.  HEENT: Pupils equal, extraocular movements intact  Respiratory: Patient's speak in full sentences and does not appear short of breath  Cardiovascular: No lower  extremity edema, non tender, no erythema  Neck exam shows significant loss of lordosis noted.  Significant tightness noted on the left side of the neck noted.  Lacks last 10 degrees of extension of the neck.  Osteopathic findings C3 flexed rotated and side bent left C6 flexed rotated and side bent left T3 extended rotated and side bent left inhaled rib T6 extended rotated and side bent left inhaled rib L4 flexed rotated and side bent right Sacrum right on right       Assessment and Plan:  Degenerative cervical disc Chronic problem with exacerbation again.  Left-sided.  Discussed potential Toradol and Depo-Medrol injections.  Patient declined those at this time.  Discussed icing regimen and home exercises, increase activity slowly.  Discussed home exercises.  Follow-up again in 6 to 8 weeks.    Nonallopathic problems  Decision today to treat with OMT was based on Physical Exam  After verbal consent patient was treated with  ME, FPR techniques in cervical, rib, thoracic, lumbar, and sacral  areas  Patient tolerated the procedure well with improvement in symptoms  Patient given exercises, stretches and lifestyle modifications  See medications in patient instructions if given  Patient will follow up in 4-8 weeks     The above documentation has been reviewed and is accurate and complete Lindsay Shropshire M Horace Wishon, DO         Note: This dictation was prepared with Dragon dictation along with smaller phrase technology. Any transcriptional errors that result from this process are unintentional.

## 2024-03-15 ENCOUNTER — Ambulatory Visit: Admitting: Family Medicine

## 2024-03-15 ENCOUNTER — Encounter: Payer: Self-pay | Admitting: Family Medicine

## 2024-03-15 VITALS — BP 128/82 | HR 67 | Ht 64.0 in | Wt 125.0 lb

## 2024-03-15 DIAGNOSIS — M5030 Other cervical disc degeneration, unspecified cervical region: Secondary | ICD-10-CM | POA: Diagnosis not present

## 2024-03-15 DIAGNOSIS — M9908 Segmental and somatic dysfunction of rib cage: Secondary | ICD-10-CM

## 2024-03-15 DIAGNOSIS — M9904 Segmental and somatic dysfunction of sacral region: Secondary | ICD-10-CM

## 2024-03-15 DIAGNOSIS — M9901 Segmental and somatic dysfunction of cervical region: Secondary | ICD-10-CM | POA: Diagnosis not present

## 2024-03-15 DIAGNOSIS — M9903 Segmental and somatic dysfunction of lumbar region: Secondary | ICD-10-CM

## 2024-03-15 DIAGNOSIS — M9902 Segmental and somatic dysfunction of thoracic region: Secondary | ICD-10-CM

## 2024-03-15 NOTE — Patient Instructions (Signed)
 Happy Holidays See me again in 6-8 weeks

## 2024-03-15 NOTE — Assessment & Plan Note (Signed)
 Chronic problem with exacerbation again.  Left-sided.  Discussed potential Toradol and Depo-Medrol injections.  Patient declined those at this time.  Discussed icing regimen and home exercises, increase activity slowly.  Discussed home exercises.  Follow-up again in 6 to 8 weeks.

## 2024-04-15 ENCOUNTER — Encounter: Payer: Self-pay | Admitting: *Deleted

## 2024-04-23 ENCOUNTER — Telehealth: Payer: Self-pay

## 2024-04-23 NOTE — Telephone Encounter (Signed)
 Copied from CRM #[id number]. Topic: Clinical - Request for Lab/Test Order >> Apr 23, 2024 11:38 AM Winona R wrote: Pt would like lab orders to have her labs done prior to annual in June. Please give the pt a call when they have been added to her chart. OK to leave a message

## 2024-04-25 ENCOUNTER — Other Ambulatory Visit: Payer: Self-pay

## 2024-04-25 DIAGNOSIS — M85851 Other specified disorders of bone density and structure, right thigh: Secondary | ICD-10-CM

## 2024-04-25 DIAGNOSIS — E785 Hyperlipidemia, unspecified: Secondary | ICD-10-CM

## 2024-04-25 DIAGNOSIS — R202 Paresthesia of skin: Secondary | ICD-10-CM

## 2024-04-25 DIAGNOSIS — R739 Hyperglycemia, unspecified: Secondary | ICD-10-CM

## 2024-04-25 DIAGNOSIS — F418 Other specified anxiety disorders: Secondary | ICD-10-CM

## 2024-04-25 DIAGNOSIS — I10 Essential (primary) hypertension: Secondary | ICD-10-CM

## 2024-04-29 IMAGING — MG MM DIGITAL SCREENING BILAT W/ TOMO AND CAD
8 series · 9 of 24 positions shown · non-contrast
Comparison: Previous exam(s).

CLINICAL DATA: Screening.

EXAM:
DIGITAL SCREENING BILATERAL MAMMOGRAM WITH TOMOSYNTHESIS AND CAD
TECHNIQUE: Bilateral screening digital craniocaudal and mediolateral oblique
mammograms were obtained. Bilateral screening digital breast
tomosynthesis was performed. The images were evaluated with
computer-aided detection.

[R MLO synth-2D]
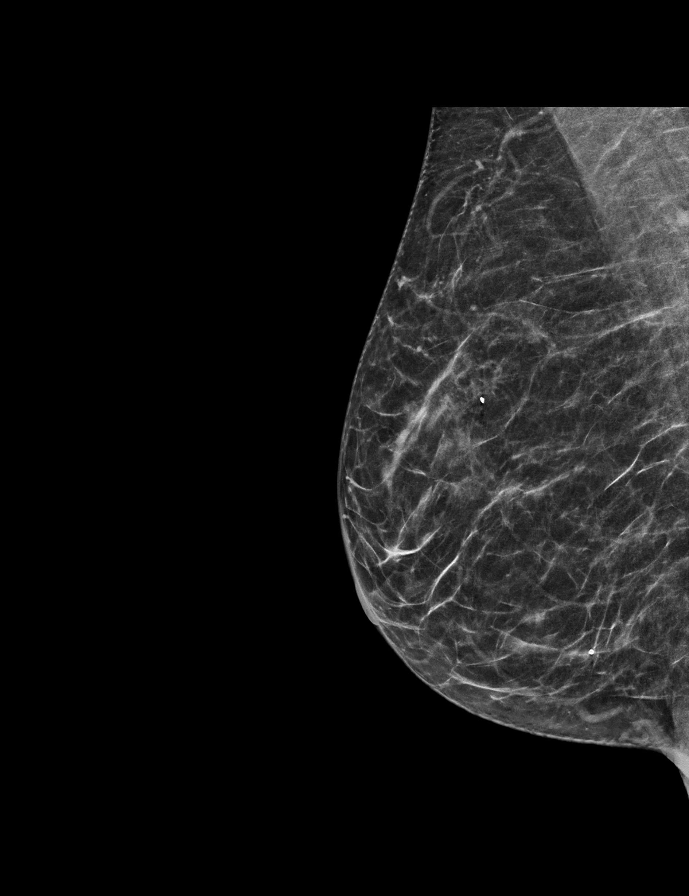

[L MLO synth-2D]
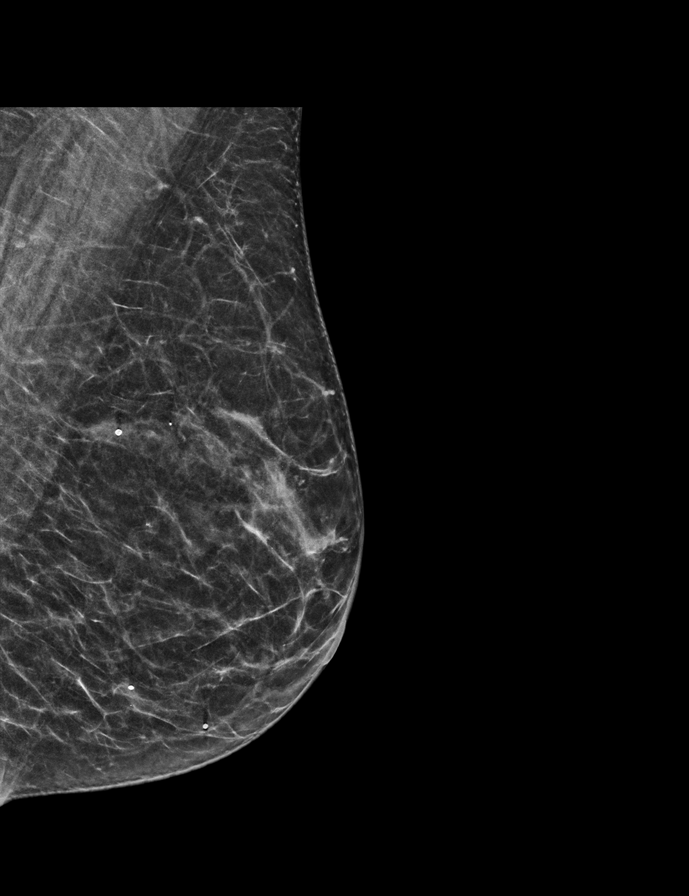

[R CC synth-2D]
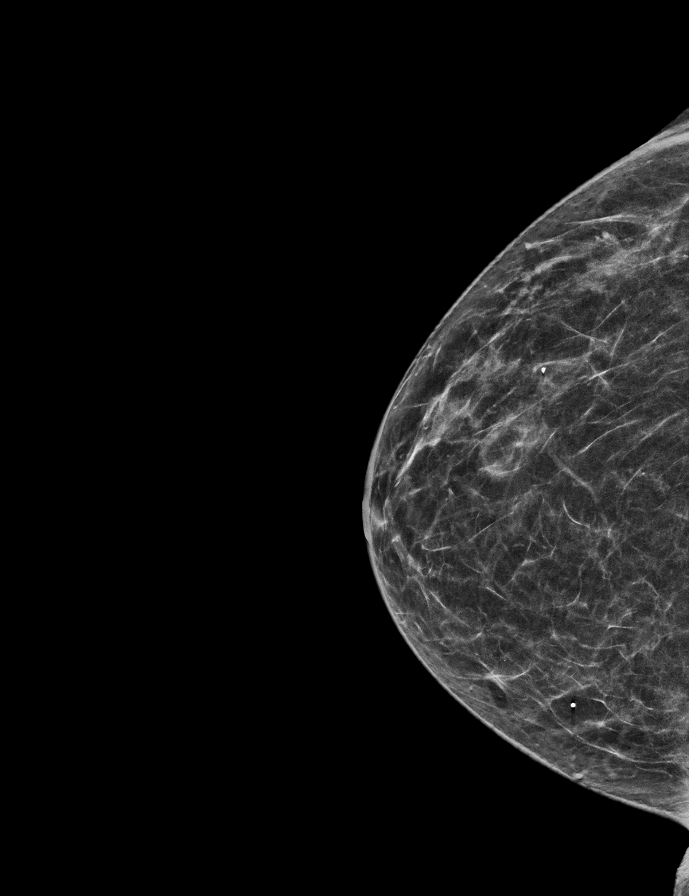

[L CC synth-2D]
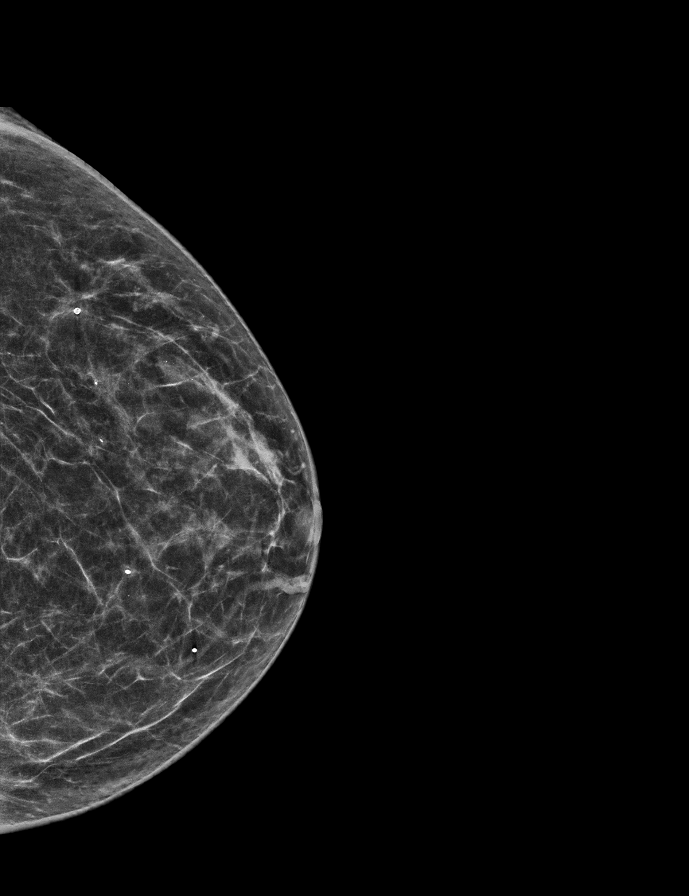

[L CC tomo · 2 of 53 frames shown]
[frame 18/53]
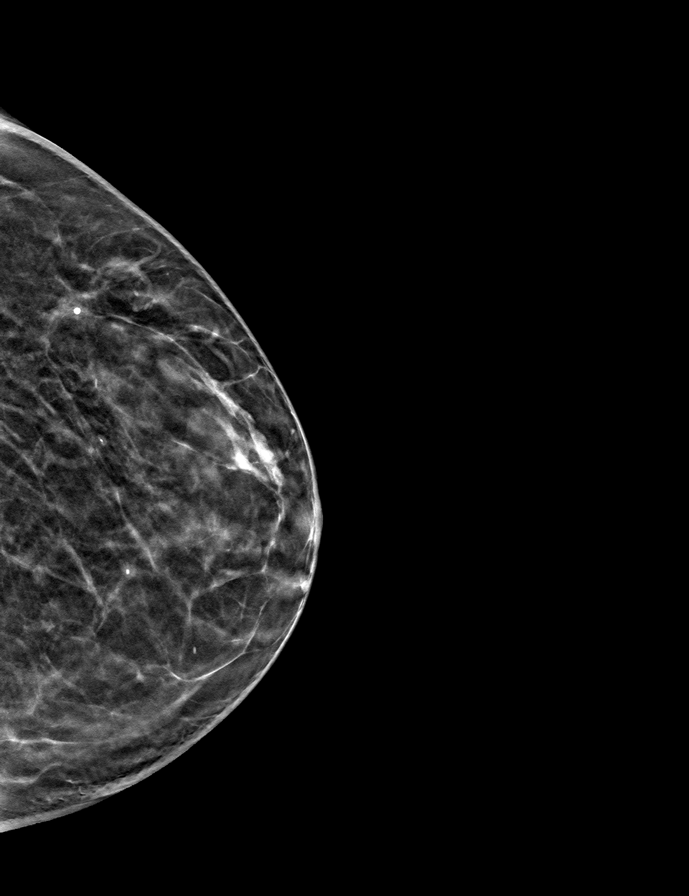
[frame 27/53]
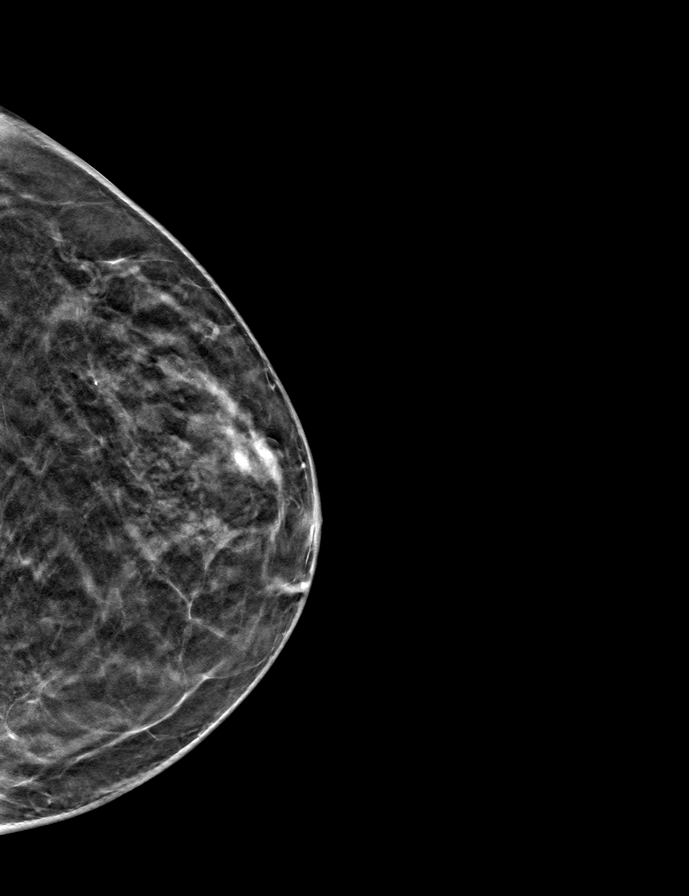

[L MLO tomo · tomo slice 26/51.0]
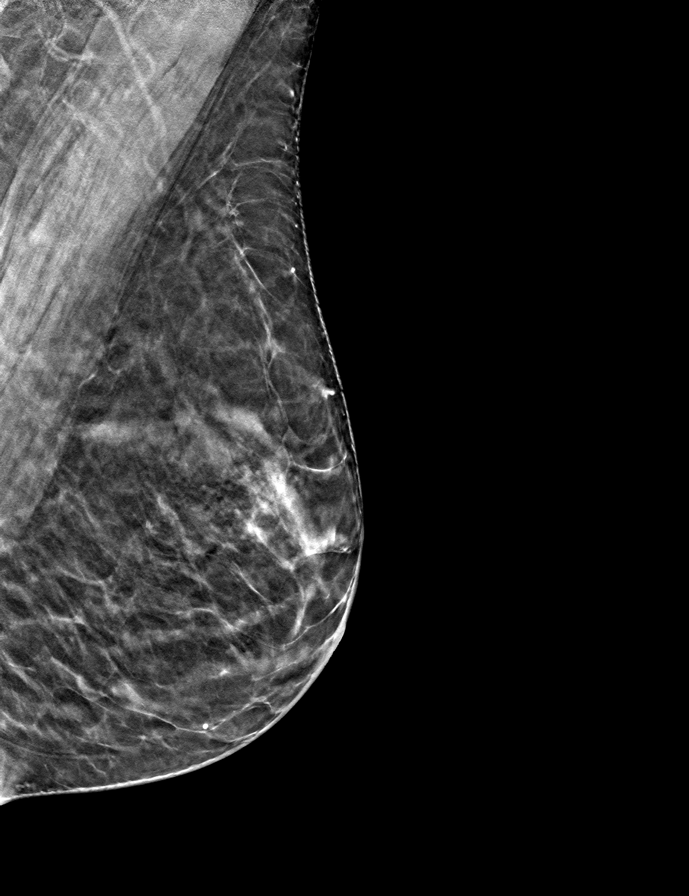

[R MLO tomo · tomo slice 26/51.0]
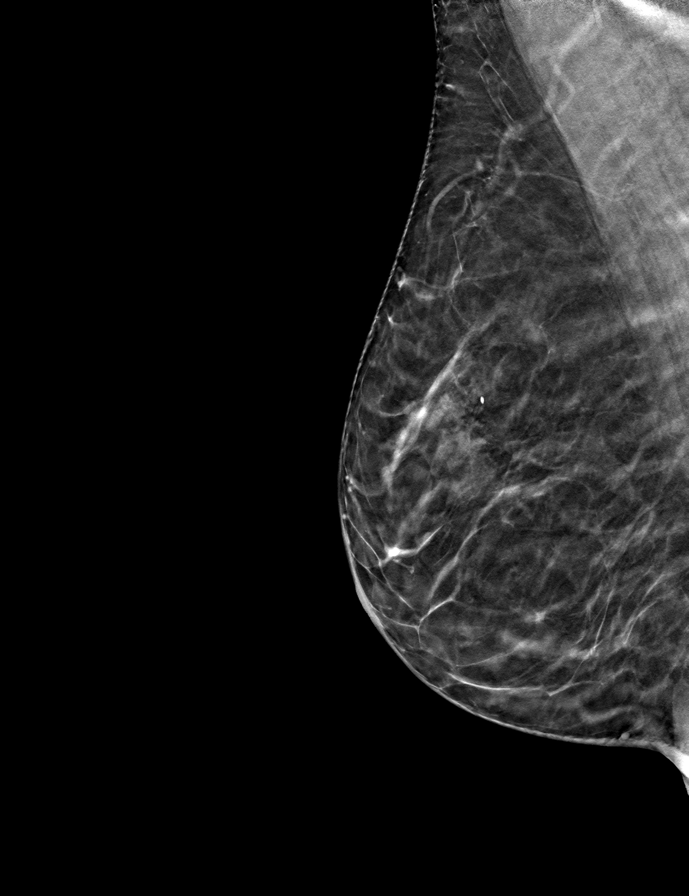

[R CC tomo · tomo slice 27/52.0]
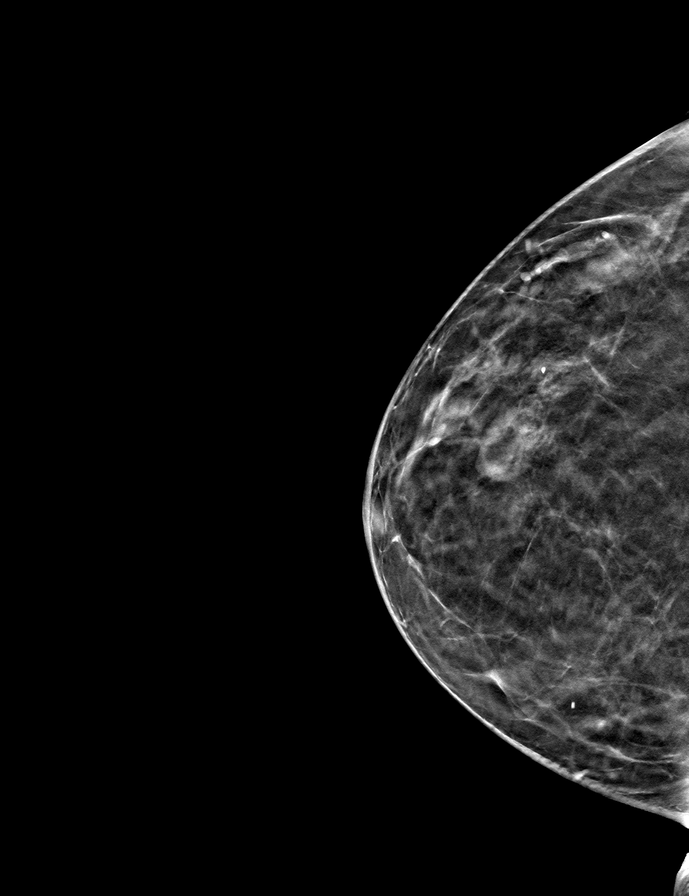

[9 of 24 positions shown; findings below may reference images not displayed]

ACR Breast Density Category b: There are scattered areas of
fibroglandular density.
FINDINGS: There are no findings suspicious for malignancy.
IMPRESSION: No mammographic evidence of malignancy. A result letter of this
screening mammogram will be mailed directly to the patient.

RECOMMENDATION:
Screening mammogram in one year. (Code:51-O-LD2)

BI-RADS CATEGORY  1: Negative.

## 2024-04-30 ENCOUNTER — Ambulatory Visit: Admitting: Family Medicine

## 2024-05-07 NOTE — Progress Notes (Unsigned)
 " [name] [address] [place] [address] Phone: [phone] Subjective:   Lindsay [name], am serving as a scribe for Dr. Arthea Claudene.  I'm seeing this patient by the request  of:  Lindsay Glade PARAS, MD  CC: Neck pain follow-up  YEP:[name]  Lindsay [name] is a 74 y.o. female coming in with complaint of back and neck pain. OMT12/03/2024. Patient states that her pain is not as bad as it was but is still painful throughout spine.  More tightness, recent URI she thinks this contributed.  Medications patient has been prescribed: None  Taking:         Reviewed prior external information including notes and imaging from previsou exam, outside providers and external EMR if available.   As well as notes that were available from care everywhere and other healthcare systems.  Past medical history, social, surgical and family history all reviewed in electronic medical record.  No pertanent information unless stated regarding to the chief complaint.   Past Medical History:  Diagnosis Date   COMMON MIGRAINE 09/20/2006   DEPRESSION 06/11/2009   GERD 06/11/2009   Hypertension 04/17/2018   Neck pain    eval with sports medicine in 2016/17   Osteopenia 04/17/2018    Allergies[1]   Review of Systems:  No headache, visual changes, nausea, vomiting, diarrhea, constipation, dizziness, abdominal pain, skin rash, fevers, chills, night sweats, weight loss, swollen lymph nodes, body aches, joint swelling, chest pain, shortness of breath, mood changes. POSITIVE muscle aches, cough but no fever  Objective  Blood pressure (!) 138/94, pulse 70, height 5' 4 (1.626 m), weight 125 lb (56.7 kg), SpO2 98%.   General: No apparent distress alert and oriented x3 mood and affect normal, dressed appropriately.  HEENT: Pupils equal, extraocular movements intact  Respiratory: Patient's speak in full sentences and does not appear short of breath  Cardiovascular: No  lower extremity edema, non tender, no erythema  Gait normal MSK:  Back exam shows loss of lordosis.  Patient does have some tenderness to palpation over the sacroiliac joint right greater than left. Neck exam shows significant limitation with only 5 degrees of extension.  Some tenderness to palpation noted as well.  Osteopathic findings  C2 flexed rotated and side bent right C6 flexed rotated and side bent left C7 flexed rotated and side bent left T3 extended rotated and side bent right inhaled rib T9 extended rotated and side bent left L2 flexed rotated and side bent right Sacrum right on right    Assessment and Plan:  Degenerative cervical disc Significant arthritic changes noted.  No will continue to monitor.  If worsening radicular symptoms I do think we need to continue to monitor.  We discussed icing regimen.  Patient does have cataracts and is having surgery and encouraged her not to follow-up till 2 weeks after the second surgery before manipulation.  Patient is in agreement with the plan.  Follow-up with me again in 6 to 8 weeks    Nonallopathic problems  Decision today to treat with OMT was based on Physical Exam  After verbal consent patient was treated with HVLA, ME, FPR techniques in cervical, rib, thoracic, lumbar, and sacral  areas  Patient tolerated the procedure well with improvement in symptoms  Patient given exercises, stretches and lifestyle modifications  See medications in patient instructions if given  Patient will follow up in 4-8 weeks     The above documentation has been reviewed and is  accurate and complete [name] M Rhylee Nunn, DO         Note: This dictation was prepared with Dragon dictation along with smaller phrase technology. Any transcriptional errors that result from this process are unintentional.            [1] No Known Allergies  "

## 2024-05-09 ENCOUNTER — Encounter: Payer: Self-pay | Admitting: Family Medicine

## 2024-05-09 ENCOUNTER — Ambulatory Visit: Admitting: Family Medicine

## 2024-05-09 VITALS — BP 138/94 | HR 70 | Ht 64.0 in | Wt 125.0 lb

## 2024-05-09 DIAGNOSIS — M9908 Segmental and somatic dysfunction of rib cage: Secondary | ICD-10-CM | POA: Diagnosis not present

## 2024-05-09 DIAGNOSIS — M9901 Segmental and somatic dysfunction of cervical region: Secondary | ICD-10-CM | POA: Diagnosis not present

## 2024-05-09 DIAGNOSIS — M5030 Other cervical disc degeneration, unspecified cervical region: Secondary | ICD-10-CM

## 2024-05-09 DIAGNOSIS — M9903 Segmental and somatic dysfunction of lumbar region: Secondary | ICD-10-CM

## 2024-05-09 DIAGNOSIS — M9902 Segmental and somatic dysfunction of thoracic region: Secondary | ICD-10-CM | POA: Diagnosis not present

## 2024-05-09 DIAGNOSIS — M9904 Segmental and somatic dysfunction of sacral region: Secondary | ICD-10-CM

## 2024-05-09 NOTE — Patient Instructions (Signed)
 Good to see you! Tell Lindsay [name] to be careful shaving his legs See you again in 6-7 weeks

## 2024-05-09 NOTE — Assessment & Plan Note (Signed)
 Significant arthritic changes noted.  No will continue to monitor.  If worsening radicular symptoms I do think we need to continue to monitor.  We discussed icing regimen.  Patient does have cataracts and is having surgery and encouraged her not to follow-up till 2 weeks after the second surgery before manipulation.  Patient is in agreement with the plan.  Follow-up with me again in 6 to 8 weeks

## 2024-06-19 ENCOUNTER — Ambulatory Visit: Admitting: Family Medicine

## 2024-09-02 ENCOUNTER — Encounter: Admitting: Internal Medicine

## 2024-09-05 ENCOUNTER — Encounter: Admitting: Internal Medicine
